# Patient Record
Sex: Male | Born: 1955 | Race: White | Hispanic: No | Marital: Single | Smoking: Former smoker
Health system: Southern US, Community
[De-identification: ages and names within clinical notes are randomized; demographics above are authoritative.]

## PROBLEM LIST (undated history)

## (undated) ENCOUNTER — Ambulatory Visit: Admission: EM | Payer: Medicaid Other | Source: Home / Self Care

## (undated) ENCOUNTER — Emergency Department (HOSPITAL_COMMUNITY): Payer: Medicaid Other

## (undated) DIAGNOSIS — Z8679 Personal history of other diseases of the circulatory system: Secondary | ICD-10-CM

## (undated) DIAGNOSIS — J449 Chronic obstructive pulmonary disease, unspecified: Secondary | ICD-10-CM

## (undated) DIAGNOSIS — G8929 Other chronic pain: Secondary | ICD-10-CM

## (undated) DIAGNOSIS — I739 Peripheral vascular disease, unspecified: Secondary | ICD-10-CM

## (undated) DIAGNOSIS — J329 Chronic sinusitis, unspecified: Secondary | ICD-10-CM

## (undated) DIAGNOSIS — G43909 Migraine, unspecified, not intractable, without status migrainosus: Secondary | ICD-10-CM

## (undated) DIAGNOSIS — I10 Essential (primary) hypertension: Secondary | ICD-10-CM

## (undated) DIAGNOSIS — K219 Gastro-esophageal reflux disease without esophagitis: Secondary | ICD-10-CM

## (undated) DIAGNOSIS — M25569 Pain in unspecified knee: Secondary | ICD-10-CM

## (undated) DIAGNOSIS — K759 Inflammatory liver disease, unspecified: Secondary | ICD-10-CM

## (undated) DIAGNOSIS — K859 Acute pancreatitis without necrosis or infection, unspecified: Secondary | ICD-10-CM

## (undated) DIAGNOSIS — B029 Zoster without complications: Secondary | ICD-10-CM

## (undated) DIAGNOSIS — M25579 Pain in unspecified ankle and joints of unspecified foot: Secondary | ICD-10-CM

## (undated) DIAGNOSIS — R569 Unspecified convulsions: Secondary | ICD-10-CM

## (undated) DIAGNOSIS — E162 Hypoglycemia, unspecified: Secondary | ICD-10-CM

## (undated) DIAGNOSIS — E785 Hyperlipidemia, unspecified: Secondary | ICD-10-CM

## (undated) HISTORY — DX: Migraine, unspecified, not intractable, without status migrainosus: G43.909

## (undated) HISTORY — DX: Zoster without complications: B02.9

## (undated) HISTORY — PX: KNEE CARTILAGE SURGERY: SHX688

## (undated) HISTORY — DX: Hypoglycemia, unspecified: E16.2

## (undated) HISTORY — PX: TOOTH EXTRACTION: SUR596

## (undated) HISTORY — DX: Essential (primary) hypertension: I10

## (undated) HISTORY — DX: Hyperlipidemia, unspecified: E78.5

---
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
## 1997-09-15 ENCOUNTER — Inpatient Hospital Stay (HOSPITAL_COMMUNITY): Admission: EM | Admit: 1997-09-15 | Discharge: 1997-09-16 | Payer: Self-pay | Admitting: *Deleted

## 2000-09-14 ENCOUNTER — Emergency Department (HOSPITAL_COMMUNITY): Admission: EM | Admit: 2000-09-14 | Discharge: 2000-09-14 | Payer: Self-pay | Admitting: Emergency Medicine

## 2003-01-18 ENCOUNTER — Ambulatory Visit (HOSPITAL_COMMUNITY): Admission: RE | Admit: 2003-01-18 | Discharge: 2003-01-18 | Payer: Self-pay | Admitting: Pulmonary Disease

## 2003-02-07 ENCOUNTER — Ambulatory Visit (HOSPITAL_COMMUNITY): Admission: RE | Admit: 2003-02-07 | Discharge: 2003-02-07 | Payer: Self-pay | Admitting: Pulmonary Disease

## 2007-06-28 ENCOUNTER — Emergency Department (HOSPITAL_COMMUNITY): Admission: EM | Admit: 2007-06-28 | Discharge: 2007-06-28 | Payer: Self-pay | Admitting: Emergency Medicine

## 2007-08-13 ENCOUNTER — Ambulatory Visit (HOSPITAL_COMMUNITY): Admission: RE | Admit: 2007-08-13 | Discharge: 2007-08-13 | Payer: Self-pay | Admitting: Pulmonary Disease

## 2009-10-06 ENCOUNTER — Emergency Department (HOSPITAL_COMMUNITY): Admission: EM | Admit: 2009-10-06 | Discharge: 2009-10-06 | Payer: Self-pay | Admitting: Emergency Medicine

## 2010-01-22 ENCOUNTER — Ambulatory Visit (HOSPITAL_COMMUNITY): Admission: RE | Admit: 2010-01-22 | Discharge: 2010-01-22 | Payer: Self-pay | Admitting: Pulmonary Disease

## 2010-02-11 ENCOUNTER — Ambulatory Visit (HOSPITAL_COMMUNITY): Admission: RE | Admit: 2010-02-11 | Discharge: 2010-02-11 | Payer: Self-pay | Admitting: Pulmonary Disease

## 2010-02-11 ENCOUNTER — Encounter: Payer: Self-pay | Admitting: Orthopedic Surgery

## 2010-02-27 ENCOUNTER — Ambulatory Visit: Payer: Self-pay | Admitting: Orthopedic Surgery

## 2010-02-27 DIAGNOSIS — IMO0002 Reserved for concepts with insufficient information to code with codable children: Secondary | ICD-10-CM

## 2010-02-27 DIAGNOSIS — S82109A Unspecified fracture of upper end of unspecified tibia, initial encounter for closed fracture: Secondary | ICD-10-CM | POA: Insufficient documentation

## 2010-04-30 ENCOUNTER — Ambulatory Visit
Admission: RE | Admit: 2010-04-30 | Discharge: 2010-04-30 | Payer: Self-pay | Source: Home / Self Care | Attending: Orthopedic Surgery | Admitting: Orthopedic Surgery

## 2010-04-30 NOTE — Letter (Signed)
Summary: History form  History form   Imported By: Jacklynn Ganong 02/28/2010 09:04:20  _____________________________________________________________________  External Attachment:    Type:   Image     Comment:   External Document

## 2010-04-30 NOTE — Assessment & Plan Note (Signed)
Summary: left knee pain xr & mri at ap/applied for cone disc/hawkins/bsf   Visit Type:  new patient Referring Briana Farner:  Dr. Kari Baars  CC:  left knee pain.  History of Present Illness: I saw Brady Bell in the office today for an initial visit.  He is a 55 years old man with the complaint of:  severe anterior/ medial knee pain with swelling and pain when eight bearing after falling down some stairs.   Xrays were taken at [facility] on 01-22-10 shows osseous demineralization, minimal drgenerative changes, no acute abnormalities. Mri also done on 02-11-10.  Medications: Topamax, Imitrex 6 mg, Alprazolam.  Allergies (verified): No Known Drug Allergies  Past History:  Family History: Last updated: 02/27/2010 Family History of Diabetes  Past Medical History: Migraines Hypoglycemia  Family History: Family History of Diabetes  Social History: Patient is single.   Review of Systems Constitutional:  Complains of fatigue; denies weight loss, weight gain, fever, and chills. Cardiovascular:  Denies chest pain, palpitations, fainting, and murmurs. Respiratory:  Complains of snoring; denies short of breath, wheezing, couch, tightness, pain on inspiration, and snoring . Gastrointestinal:  Denies heartburn, nausea, vomiting, diarrhea, constipation, and blood in your stools. Genitourinary:  Denies frequency, urgency, difficulty urinating, painful urination, flank pain, and bleeding in urine. Neurologic:  Complains of seizure; denies numbness, tingling, unsteady gait, dizziness, and tremors. Musculoskeletal:  Complains of swelling and muscle pain; denies joint pain, instability, stiffness, redness, and heat. Endocrine:  Denies excessive thirst, exessive urination, and heat or cold intolerance. Psychiatric:  Denies nervousness, depression, anxiety, and hallucinations. Skin:  Denies changes in the skin, poor healing, rash, itching, and redness. HEENT:  Complains of watering; denies  blurred or double vision, eye pain, and redness. Immunology:  Complains of seasonal allergies; denies sinus problems and allergic to bee stings. Hemoatologic:  Denies easy bleeding and brusing.  Physical Exam  Additional Exam:  This is a well-developed well-nourished male in no acute distress with normal grooming and hygiene and no gross deformities  He has normal pulse and perfusion and no peripheral edema in his lower extremities  His lymph nodes are negative with no lymphangitis  Skin intact with no rashes in the LEFT lower extremity  Sensory exam is normal coordination is excellent balance is good.  He is awake alert and oriented x3 his mood and affect are normal  His ambulation is significant for a mild limp favoring the LEFT leg  He does have tenderness over the medial tibia and medial joint line primarily anteriorly but not posteriorly.  There is no joint effusion he is very tender in the suprapatellar pouch.  He has full range of motion good strength in his leg and his knee is stable with a minimal McMurray sign.  His upper extremities are benign with normal inspection, lack of contracture, lack of subluxation atrophy or tremor   Impression & Recommendations:  Problem # 1:  TEAR MEDIAL MENISCUS (ICD-836.0) Assessment New  imaging and reports LEFT knee negative plain films positive MRI with medial meniscal tear and proximal tibial bone contusion  I think this is the source of his pain I am not concerned about the meniscal tear per se.  While this may have been torn at the time of the fall he seems to be having more pain at the proximal tibia.  Recommend hinged knee brace to unload the joint followup 6 weeks assess pain relief at that time.  Mechanical symptoms should be the impetus for surgical treatment of the  meniscal tear not pain  Orders: New Patient Level III (16109)  Problem # 2:  CLOSED FRACTURE OF UPPER END OF TIBIA (ICD-823.00) Assessment: New  Orders: New  Patient Level III (60454)  Patient Instructions: 1)  Please schedule a follow-up appointment in 6 weeks 2)  wear brace for all weight bearing activity    Orders Added: 1)  New Patient Level III [09811]

## 2010-04-30 NOTE — Medication Information (Signed)
Summary: Tax adviser   Imported By: Cammie Sickle 02/27/2010 11:47:37  _____________________________________________________________________  External Attachment:    Type:   Image     Comment:   External Document
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
## 2010-06-16 LAB — POCT I-STAT, CHEM 8
BUN: 7 mg/dL (ref 6–23)
Calcium, Ion: 1.09 mmol/L — ABNORMAL LOW (ref 1.12–1.32)
HCT: 49 % (ref 39.0–52.0)
Hemoglobin: 16.7 g/dL (ref 13.0–17.0)
Sodium: 137 mEq/L (ref 135–145)
TCO2: 18 mmol/L (ref 0–100)

## 2010-06-16 LAB — ETHANOL: Alcohol, Ethyl (B): <5 mg/dL (ref 0–10)

## 2010-06-16 LAB — GLUCOSE, CAPILLARY: Glucose-Capillary: 127 mg/dL — ABNORMAL HIGH (ref 70–99)

## 2010-06-16 LAB — CARBAMAZEPINE LEVEL, TOTAL: Carbamazepine Lvl: <2 ug/mL — ABNORMAL LOW (ref 4.0–12.0)

## 2010-12-23 LAB — DIFFERENTIAL
Basophils Absolute: 0
Basophils Relative: 0
Lymphocytes Relative: 16
Monocytes Relative: 7
Neutro Abs: 5.5
Neutrophils Relative %: 76

## 2010-12-23 LAB — CBC
MCHC: 35.4
RBC: 4.29

## 2010-12-23 LAB — BASIC METABOLIC PANEL
CO2: 22
Calcium: 9.1
Creatinine, Ser: 1.02
GFR calc Af Amer: >60
GFR calc non Af Amer: >60

## 2011-04-29 ENCOUNTER — Emergency Department (HOSPITAL_COMMUNITY)
Admission: EM | Admit: 2011-04-29 | Discharge: 2011-04-30 | Disposition: A | Discharge status: Home / Self Care | Payer: Self-pay | Attending: Emergency Medicine | Admitting: Emergency Medicine

## 2011-04-29 ENCOUNTER — Encounter (HOSPITAL_COMMUNITY): Payer: Self-pay | Admitting: *Deleted

## 2011-04-29 DIAGNOSIS — S93409A Sprain of unspecified ligament of unspecified ankle, initial encounter: Secondary | ICD-10-CM | POA: Insufficient documentation

## 2011-04-29 DIAGNOSIS — S93401A Sprain of unspecified ligament of right ankle, initial encounter: Secondary | ICD-10-CM

## 2011-04-29 DIAGNOSIS — G43909 Migraine, unspecified, not intractable, without status migrainosus: Secondary | ICD-10-CM | POA: Insufficient documentation

## 2011-04-29 DIAGNOSIS — Y92009 Unspecified place in unspecified non-institutional (private) residence as the place of occurrence of the external cause: Secondary | ICD-10-CM | POA: Insufficient documentation

## 2011-04-29 DIAGNOSIS — F17200 Nicotine dependence, unspecified, uncomplicated: Secondary | ICD-10-CM | POA: Insufficient documentation

## 2011-04-29 DIAGNOSIS — W010XXA Fall on same level from slipping, tripping and stumbling without subsequent striking against object, initial encounter: Secondary | ICD-10-CM | POA: Insufficient documentation

## 2011-04-29 NOTE — ED Notes (Signed)
Pt fell in the yard and twisted his R ankle and fell on top of his R ankle; Was able to get up and ambulate back indoors; swelling  And pain increased pt called EMS for transport to ED

## 2011-04-30 ENCOUNTER — Emergency Department (HOSPITAL_COMMUNITY): Payer: Self-pay

## 2011-04-30 ENCOUNTER — Encounter (HOSPITAL_COMMUNITY): Payer: Self-pay | Admitting: Emergency Medicine

## 2011-04-30 MED ORDER — IBUPROFEN 400 MG PO TABS
400.0000 mg | ORAL_TABLET | Freq: Once | ORAL | Status: AC
  Administered 2011-04-30: 400 mg via ORAL
  Filled 2011-04-30: qty 1

## 2011-04-30 MED ORDER — HYDROCODONE-ACETAMINOPHEN 5-325 MG PO TABS
1.0000 | ORAL_TABLET | Freq: Once | ORAL | Status: AC
  Administered 2011-04-30: 1 via ORAL
  Filled 2011-04-30: qty 1

## 2011-04-30 MED ORDER — NAPROXEN 250 MG PO TABS: 250.0000 mg | ORAL_TABLET | Freq: Two times a day (BID) | ORAL | Status: DC

## 2011-04-30 MED ORDER — HYDROCODONE-ACETAMINOPHEN 5-325 MG PO TABS: ORAL_TABLET | ORAL | Status: AC

## 2011-04-30 NOTE — ED Notes (Signed)
Crunches and air splint education provided questions answered

## 2011-04-30 NOTE — ED Provider Notes (Signed)
History     CSN: [id number]  Arrival date & time 04/29/11  2347   Chief Complaint  Patient presents with  . Fall  . Ankle Pain    Right   HPI Pt was seen at 0025.  Per pt, c/o gradual onset and persistence of right ankle "pain" that began last evening PTA.  Pt states he was in his yard, slipped/fell and "twisted" his right ankle.  Has been ambulatory since falling.  Denies prodromal symptoms before fall, no CP/SOB, no neck or back pain, no head injury/LOC, no abd pain, no tingling/numbness in extremities, no focal motor weakness.    Ortho:  Dr. Romeo Apple PMD:  Dr. Juanetta Gosling Past Medical History  Diagnosis Date  . Migraines   . Hypoglycemia     History reviewed. No pertinent past surgical history.  Family History  Problem Relation Age of Onset  . Diabetes      History  Substance Use Topics  . Smoking status: Current Everyday Smoker    Types: Cigarettes  . Smokeless tobacco: Not on file  . Alcohol Use: Yes    Review of Systems ROS: Statement: All systems negative except as marked or noted in the HPI; Constitutional: Negative for fever and chills. ; ; Eyes: Negative for eye pain, redness and discharge. ; ; ENMT: Negative for ear pain, hoarseness, nasal congestion, sinus pressure and sore throat. ; ; Cardiovascular: Negative for chest pain, palpitations, diaphoresis, dyspnea and peripheral edema. ; ; Respiratory: Negative for cough, wheezing and stridor. ; ; Gastrointestinal: Negative for nausea, vomiting, diarrhea, abdominal pain, blood in stool, hematemesis, jaundice and rectal bleeding. . ; ; Genitourinary: Negative for dysuria, flank pain and hematuria. ; ; Musculoskeletal: Negative for back pain and neck pain. +swelling and pain right ankle.; Skin: Negative for pruritus, rash, abrasions, blisters, bruising and skin lesion.; ; Neuro: Negative for headache, lightheadedness and neck stiffness. Negative for weakness, altered level of consciousness , altered mental status, extremity  weakness, paresthesias, involuntary movement, seizure and syncope.     Allergies  Review of patient's allergies indicates no known allergies.  Home Medications   Current Outpatient Rx  Name Route Sig Dispense Refill  . XANAX PO Oral Take by mouth.    . SUMATRIPTAN SUCCINATE 6 MG/0.5ML Bennett SOLN Subcutaneous Inject 6 mg into the skin every 2 (two) hours as needed. F    . HYDROCODONE-ACETAMINOPHEN 5-325 MG PO TABS  1 or 2 tabs PO q4-6 hours prn pain 20 tablet 0  . NAPROXEN 250 MG PO TABS Oral Take 1 tablet (250 mg total) by mouth 2 (two) times daily with a meal. 14 tablet 0    BP 128/89  Pulse 85  Temp(Src) 98 F (36.7 C) (Oral)  Resp 18  Ht 5\' 10"  (1.778 m)  Wt 140 lb (63.504 kg)  BMI 20.09 kg/m2  SpO2 94%  Physical Exam 0030: Physical examination:  Nursing notes reviewed; Vital signs and O2 SAT reviewed;  Constitutional: Well developed, Well nourished, Well hydrated, In no acute distress; Head:  Normocephalic, atraumatic; Eyes: EOMI, PERRL, No scleral icterus; ENMT: Mouth and pharynx normal, Mucous membranes moist; Neck: Supple, Full range of motion, No lymphadenopathy; Cardiovascular: Regular rate and rhythm, No murmur, rub, or gallop; Respiratory: Breath sounds clear & equal bilaterally, No rales, rhonchi, wheezes, or rub, Normal respiratory effort/excursion; Chest: Nontender, Movement normal; Abdomen: Soft, Nontender, Nondistended, Normal bowel sounds; Extremities: Pulses normal, +tender to palp right lateral maleolar area w/localized edema, NMS intact right foot, strong pedal pp, LE muscle  compartments soft.  No right proximal fibular head tenderness, no knee tenderness, no right foot tenderness.  No open wounds, no ecchymosis, no deformity. +plantarflexion of right foot w/calf squeeze.  No palpable gap right Achilles's tendon.  Decreased ROM F/E d/t pain.,No calf edema or asymmetry.; Neuro: AA&Ox3, Major CN grossly intact. No facial droop, speech clear. No gross focal motor or sensory  deficits in extremities.; Skin: Color normal, Warm, Dry, no rash.    ED Course  Procedures   MDM  MDM Reviewed: nursing note, vitals and previous chart Interpretation: x-ray   Dg Ankle Complete Right 04/30/2011  *RADIOLOGY REPORT*  Clinical Data: Right ankle pain after twisting injury  RIGHT ANKLE - COMPLETE 3+ VIEW  Comparison: None.  Findings: Mild soft tissue swelling about the right ankle.  No evidence of acute fracture or subluxation.  No focal bone lesion or bone destruction.  Bone cortex and trabecular architecture appear intact.  Ankle mortis and talar dome appear intact.  No abnormal periosteal reaction.  No radiopaque foreign bodies in the soft tissues.  IMPRESSION: Soft tissue swelling.  No acute fractures demonstrated.  Original Report Authenticated By: Marlon Pel, M.D.     12:46 AM:   Dx testing d/w pt.  Questions answered.  Verb understanding, agreeable to d/c home with outpt f/u.       Rifky Lapre Allison Quarry, DO 05/02/11 [phone]

## 2011-06-03 ENCOUNTER — Emergency Department (HOSPITAL_COMMUNITY): Payer: Self-pay

## 2011-06-03 ENCOUNTER — Emergency Department (HOSPITAL_COMMUNITY)
Admission: EM | Admit: 2011-06-03 | Discharge: 2011-06-03 | Disposition: A | Discharge status: Home / Self Care | Payer: Self-pay | Attending: Emergency Medicine | Admitting: Emergency Medicine

## 2011-06-03 ENCOUNTER — Encounter (HOSPITAL_COMMUNITY): Payer: Self-pay | Admitting: *Deleted

## 2011-06-03 DIAGNOSIS — E1169 Type 2 diabetes mellitus with other specified complication: Secondary | ICD-10-CM | POA: Insufficient documentation

## 2011-06-03 DIAGNOSIS — M47812 Spondylosis without myelopathy or radiculopathy, cervical region: Secondary | ICD-10-CM | POA: Insufficient documentation

## 2011-06-03 DIAGNOSIS — E162 Hypoglycemia, unspecified: Secondary | ICD-10-CM

## 2011-06-03 DIAGNOSIS — S0990XA Unspecified injury of head, initial encounter: Secondary | ICD-10-CM | POA: Insufficient documentation

## 2011-06-03 DIAGNOSIS — R569 Unspecified convulsions: Secondary | ICD-10-CM | POA: Insufficient documentation

## 2011-06-03 DIAGNOSIS — S0003XA Contusion of scalp, initial encounter: Secondary | ICD-10-CM | POA: Insufficient documentation

## 2011-06-03 DIAGNOSIS — F17200 Nicotine dependence, unspecified, uncomplicated: Secondary | ICD-10-CM | POA: Insufficient documentation

## 2011-06-03 DIAGNOSIS — G43909 Migraine, unspecified, not intractable, without status migrainosus: Secondary | ICD-10-CM | POA: Insufficient documentation

## 2011-06-03 DIAGNOSIS — X58XXXA Exposure to other specified factors, initial encounter: Secondary | ICD-10-CM | POA: Insufficient documentation

## 2011-06-03 DIAGNOSIS — S0120XA Unspecified open wound of nose, initial encounter: Secondary | ICD-10-CM | POA: Insufficient documentation

## 2011-06-03 DIAGNOSIS — R55 Syncope and collapse: Secondary | ICD-10-CM | POA: Insufficient documentation

## 2011-06-03 HISTORY — DX: Unspecified convulsions: R56.9

## 2011-06-03 LAB — GLUCOSE, CAPILLARY: Glucose-Capillary: 138 mg/dL — ABNORMAL HIGH (ref 70–99)

## 2011-06-03 MED ORDER — HYDROCODONE-ACETAMINOPHEN 5-325 MG PO TABS
1.0000 | ORAL_TABLET | Freq: Once | ORAL | Status: AC
  Administered 2011-06-03: 1 via ORAL
  Filled 2011-06-03: qty 1

## 2011-06-03 NOTE — ED Provider Notes (Signed)
History   This chart was scribed for [employer]. Colon Branch, MD by Clarita Crane. The patient was seen in room APA02/APA02. Patient's care was started at 1215.    CSN: [id number]  Arrival date & time 06/03/11  1215   First MD Initiated Contact with Patient 06/03/11 1321      Chief Complaint  Patient presents with  . Seizures    (Consider location/radiation/quality/duration/timing/severity/associated sxs/prior treatment) HPI Brady Bell is a 56 y.o. male who presents to the Emergency Department to be evaluated following witnessed seizure which occurred while visiting with his mother in the hospital this afternoon. Patient sustained head injury with seizure and small laceration to bridge of nose. Also presents with bruising and swelling to left periorbital region. Denies HA, fever, incontinence, fever. Reports he has been experiencing increased stress recently as a result of a family member being hospitalized. Patient states he has experienced seizures previously but infrequently. Patient with h/o migraines, hypoglycemia and seizures.   Past Medical History  Diagnosis Date  . Migraines   . Hypoglycemia   . Seizures     History reviewed. No pertinent past surgical history.  Family History  Problem Relation Age of Onset  . Diabetes      History  Substance Use Topics  . Smoking status: Current Everyday Smoker    Types: Cigarettes  . Smokeless tobacco: Not on file  . Alcohol Use: Yes      Review of Systems 10 Systems reviewed and are negative for acute change except as noted in the HPI.  Allergies  Review of patient's allergies indicates no known allergies.  Home Medications   Current Outpatient Rx  Name Route Sig Dispense Refill  . ALPRAZOLAM 2 MG PO TABS Oral Take 2 mg by mouth daily.    Marland Kitchen NAPROXEN 500 MG PO TABS Oral Take 500 mg by mouth 3 (three) times daily.    . SUMATRIPTAN SUCCINATE 6 MG/0.5ML [place] SOLN Subcutaneous Inject 6 mg into the skin every 2 (two) hours as  needed. Migraines      BP 131/84  Pulse 93  Temp(Src) 97.9 F (36.6 C) (Oral)  Resp 18  Ht 5\' 10"  (1.778 m)  Wt 140 lb (63.504 kg)  BMI 20.09 kg/m2  SpO2 97%  Physical Exam  Nursing note and vitals reviewed. Constitutional: He is oriented to person, place, and time. He appears well-developed and well-nourished. No distress.  HENT:  Head: Normocephalic and atraumatic.  Mouth/Throat: Oropharynx is clear and moist.       TMs normal bilaterally. Bruising noted across forehead. Left periorbital edema. Small laceration to bridge of nose. No deformity noted to nose. No oral lesions.   Eyes: EOM are normal. Pupils are equal, round, and reactive to light.  Neck: Neck supple. No tracheal deviation present.  Cardiovascular: Normal rate and regular rhythm.  Exam reveals no gallop and no friction rub.   No murmur heard. Pulmonary/Chest: Effort normal. No respiratory distress. He has no wheezes. He has no rales.       Crackles at bilateral bases.   Abdominal: Soft. He exhibits no distension.  Musculoskeletal: Normal range of motion. He exhibits no edema.       Brace noted to right lower leg.   Neurological: He is alert and oriented to person, place, and time. No sensory deficit.  Skin: Skin is warm and dry.  Psychiatric: He has a normal mood and affect. His behavior is normal.    ED Course  Procedures (including critical care time)  DIAGNOSTIC STUDIES: Oxygen Saturation is 97% on room air, normal by my interpretation.    COORDINATION OF CARE:  1:56PM- Patient informed of current plan for treatment and evaluation and agrees with plan at this time.  Ct Head Wo Contrast  06/03/2011  *RADIOLOGY REPORT*  Clinical Data:  Seizure.  Trauma to head.  Laceration to bridge of the nose.  Hypoglycemic.  CT HEAD WITHOUT CONTRAST CT MAXILLOFACIAL WITHOUT CONTRAST CT CERVICAL SPINE WITHOUT CONTRAST  Technique:  Multidetector CT imaging of the head, cervical spine, and maxillofacial structures were  performed using the standard protocol without intravenous contrast. Multiplanar CT image reconstructions of the cervical spine and maxillofacial structures were also generated.  Comparison:  CT head without contrast 10/06/2009 at [facility].  CT cervical spine without contrast at [facility] 06/28/2007.  CT HEAD  Findings: No acute cortical infarct, hemorrhage, or mass lesion is present.  Mild atrophy is similar to the prior exam.  The ventricles are of normal size.  No significant extra-axial fluid collection is present.  Left supraorbital soft tissue swelling is present.  There is no underlying fracture.  The globes and orbits are intact. Circumferential mucosal thickening is present in the left maxillary sinus.  A remote orbital blowout fractures present on the right. There is mild circumferential mucosal thickening in the left frontal sinus.  The mastoid air cells are clear.  IMPRESSION:  1.  No acute intracranial abnormality or significant interval change. 2.  Stable mild age advanced atrophy. 3.  Left supraorbital scalp hematoma without underlying fracture. 4.  Anterior left sinus disease.  CT MAXILLOFACIAL  Findings:  A left supraorbital scalp hematoma is present.  There is no underlying fracture.  Asymmetric left frontal and to a greater extent left maxillary sinus disease is present.  The patient is status post bilateral turbinectomy with some residual portion of the right inferior turbinate remaining.  The nasal septum is eroded or resected anteriorly.  There is no osseous obstruction of the ostiomeatal complex.  There is soft tissue filling the residual ostiomeatal complex on the left.  Chronic left maxillary sinus disease present.  There is a tiny fluid level in the right maxillary sinus.  Asymmetric mucosal disease is present in the left frontal sinus and scattered throughout the ethmoid air cells.  A remote right orbital blowout fracture is noted.  The globes and orbits are intact.   The mastoid air cells are clear.  IMPRESSION:  1.  Left supraorbital scalp hematoma without underlying fracture. 2.  Chronic left maxillary sinus disease. 3.  Nasal surgery as described. 4.  Left frontal sinus disease.  CT CERVICAL SPINE  Findings:   The cervical spine is imaged from skull base through T2- 3.  Slight degenerative anterolisthesis is present at C3-4.  AP alignment is otherwise anatomic. No acute fracture or traumatic subluxation is evident.  Uncovertebral spurring with right greater than left foraminal stenosis at C5-6 and left greater than right foraminal narrowing at C6-7 is not significantly changed from the prior exam.  Asymmetric right-sided uncovertebral disease at C3 is similar to the prior study.  No acute fracture or traumatic subluxation is evident.  Emphysematous changes at the lung apices bilaterally are similar to the prior study.  The soft tissues of the neck are unremarkable.  IMPRESSION:  1.  No acute fracture or traumatic subluxation. 2.  Stable spondylosis. 3.  Severe emphysematous changes are similar to the prior exam.  Original Report Authenticated By: Jamesetta Orleans. MATTERN, M.D.  Ct Cervical Spine Wo Contrast  06/03/2011  *RADIOLOGY REPORT*  Clinical Data:  Seizure.  Trauma to head.  Laceration to bridge of the nose.  Hypoglycemic.  CT HEAD WITHOUT CONTRAST CT MAXILLOFACIAL WITHOUT CONTRAST CT CERVICAL SPINE WITHOUT CONTRAST  Technique:  Multidetector CT imaging of the head, cervical spine, and maxillofacial structures were performed using the standard protocol without intravenous contrast. Multiplanar CT image reconstructions of the cervical spine and maxillofacial structures were also generated.  Comparison:  CT head without contrast 10/06/2009 at [facility].  CT cervical spine without contrast at [facility] 06/28/2007.  CT HEAD  Findings: No acute cortical infarct, hemorrhage, or mass lesion is present.  Mild atrophy is similar to the prior exam.  The  ventricles are of normal size.  No significant extra-axial fluid collection is present.  Left supraorbital soft tissue swelling is present.  There is no underlying fracture.  The globes and orbits are intact. Circumferential mucosal thickening is present in the left maxillary sinus.  A remote orbital blowout fractures present on the right. There is mild circumferential mucosal thickening in the left frontal sinus.  The mastoid air cells are clear.  IMPRESSION:  1.  No acute intracranial abnormality or significant interval change. 2.  Stable mild age advanced atrophy. 3.  Left supraorbital scalp hematoma without underlying fracture. 4.  Anterior left sinus disease.  CT MAXILLOFACIAL  Findings:  A left supraorbital scalp hematoma is present.  There is no underlying fracture.  Asymmetric left frontal and to a greater extent left maxillary sinus disease is present.  The patient is status post bilateral turbinectomy with some residual portion of the right inferior turbinate remaining.  The nasal septum is eroded or resected anteriorly.  There is no osseous obstruction of the ostiomeatal complex.  There is soft tissue filling the residual ostiomeatal complex on the left.  Chronic left maxillary sinus disease present.  There is a tiny fluid level in the right maxillary sinus.  Asymmetric mucosal disease is present in the left frontal sinus and scattered throughout the ethmoid air cells.  A remote right orbital blowout fracture is noted.  The globes and orbits are intact.  The mastoid air cells are clear.  IMPRESSION:  1.  Left supraorbital scalp hematoma without underlying fracture. 2.  Chronic left maxillary sinus disease. 3.  Nasal surgery as described. 4.  Left frontal sinus disease.  CT CERVICAL SPINE  Findings:   The cervical spine is imaged from skull base through T2- 3.  Slight degenerative anterolisthesis is present at C3-4.  AP alignment is otherwise anatomic. No acute fracture or traumatic subluxation is evident.   Uncovertebral spurring with right greater than left foraminal stenosis at C5-6 and left greater than right foraminal narrowing at C6-7 is not significantly changed from the prior exam.  Asymmetric right-sided uncovertebral disease at C3 is similar to the prior study.  No acute fracture or traumatic subluxation is evident.  Emphysematous changes at the lung apices bilaterally are similar to the prior study.  The soft tissues of the neck are unremarkable.  IMPRESSION:  1.  No acute fracture or traumatic subluxation. 2.  Stable spondylosis. 3.  Severe emphysematous changes are similar to the prior exam.  Original Report Authenticated By: Jamesetta Orleans. MATTERN, M.D.   Ct Maxillofacial Wo Cm  06/03/2011  *RADIOLOGY REPORT*  Clinical Data:  Seizure.  Trauma to head.  Laceration to bridge of the nose.  Hypoglycemic.  CT HEAD WITHOUT CONTRAST CT MAXILLOFACIAL WITHOUT CONTRAST  CT CERVICAL SPINE WITHOUT CONTRAST  Technique:  Multidetector CT imaging of the head, cervical spine, and maxillofacial structures were performed using the standard protocol without intravenous contrast. Multiplanar CT image reconstructions of the cervical spine and maxillofacial structures were also generated.  Comparison:  CT head without contrast 10/06/2009 at [facility].  CT cervical spine without contrast at [facility] 06/28/2007.  CT HEAD  Findings: No acute cortical infarct, hemorrhage, or mass lesion is present.  Mild atrophy is similar to the prior exam.  The ventricles are of normal size.  No significant extra-axial fluid collection is present.  Left supraorbital soft tissue swelling is present.  There is no underlying fracture.  The globes and orbits are intact. Circumferential mucosal thickening is present in the left maxillary sinus.  A remote orbital blowout fractures present on the right. There is mild circumferential mucosal thickening in the left frontal sinus.  The mastoid air cells are clear.  IMPRESSION:  1.   No acute intracranial abnormality or significant interval change. 2.  Stable mild age advanced atrophy. 3.  Left supraorbital scalp hematoma without underlying fracture. 4.  Anterior left sinus disease.  CT MAXILLOFACIAL  Findings:  A left supraorbital scalp hematoma is present.  There is no underlying fracture.  Asymmetric left frontal and to a greater extent left maxillary sinus disease is present.  The patient is status post bilateral turbinectomy with some residual portion of the right inferior turbinate remaining.  The nasal septum is eroded or resected anteriorly.  There is no osseous obstruction of the ostiomeatal complex.  There is soft tissue filling the residual ostiomeatal complex on the left.  Chronic left maxillary sinus disease present.  There is a tiny fluid level in the right maxillary sinus.  Asymmetric mucosal disease is present in the left frontal sinus and scattered throughout the ethmoid air cells.  A remote right orbital blowout fracture is noted.  The globes and orbits are intact.  The mastoid air cells are clear.  IMPRESSION:  1.  Left supraorbital scalp hematoma without underlying fracture. 2.  Chronic left maxillary sinus disease. 3.  Nasal surgery as described. 4.  Left frontal sinus disease.  CT CERVICAL SPINE  Findings:   The cervical spine is imaged from skull base through T2- 3.  Slight degenerative anterolisthesis is present at C3-4.  AP alignment is otherwise anatomic. No acute fracture or traumatic subluxation is evident.  Uncovertebral spurring with right greater than left foraminal stenosis at C5-6 and left greater than right foraminal narrowing at C6-7 is not significantly changed from the prior exam.  Asymmetric right-sided uncovertebral disease at C3 is similar to the prior study.  No acute fracture or traumatic subluxation is evident.  Emphysematous changes at the lung apices bilaterally are similar to the prior study.  The soft tissues of the neck are unremarkable.   IMPRESSION:  1.  No acute fracture or traumatic subluxation. 2.  Stable spondylosis. 3.  Severe emphysematous changes are similar to the prior exam.  Original Report Authenticated By: Jamesetta Orleans. MATTERN, M.D.   Results for orders placed during the hospital encounter of 06/03/11  GLUCOSE, CAPILLARY      Component Value Range   Glucose-Capillary 138 (*) 70 - 99 (mg/dL)      MDM  Diabetic patient who has had syncope with seizure like activity when hypoglycemic who had the same while in his mother's hospital room today. Given a meal to eat. CTs negative for an acute process Pt stable  in ED with no significant deterioration in condition.The patient appears reasonably screened and/or stabilized for discharge and I doubt any other medical condition or other [facility] requiring further screening, evaluation, or treatment in the ED at this time prior to discharge.  I personally performed the services described in this documentation, which was scribed in my presence. The recorded information has been reviewed and considered.   MDM Reviewed: nursing note and vitals Interpretation: labs  CT scans reviewed        Nicoletta Dress. Colon Branch, MD 06/03/11 [id number]

## 2011-06-03 NOTE — Discharge Instructions (Signed)
The CTs of your head, neck and face do NOT show any broken bones. Be sure to eat regularly.

## 2011-06-03 NOTE — ED Notes (Signed)
Pt was visitor in pt room, and says he had a  Seizure,Injury to nose, bit his lip contusions.

## 2011-09-04 ENCOUNTER — Inpatient Hospital Stay (HOSPITAL_COMMUNITY)
Admission: EM | Admit: 2011-09-04 | Discharge: 2011-09-11 | DRG: 253 | Disposition: A | Discharge status: Home Health | Payer: Medicaid Other | Attending: Vascular Surgery | Admitting: Vascular Surgery

## 2011-09-04 ENCOUNTER — Emergency Department (HOSPITAL_COMMUNITY): Payer: Medicaid Other

## 2011-09-04 ENCOUNTER — Encounter (HOSPITAL_COMMUNITY): Payer: Self-pay

## 2011-09-04 DIAGNOSIS — I7090 Unspecified atherosclerosis: Secondary | ICD-10-CM

## 2011-09-04 DIAGNOSIS — M79609 Pain in unspecified limb: Secondary | ICD-10-CM

## 2011-09-04 DIAGNOSIS — I7092 Chronic total occlusion of artery of the extremities: Secondary | ICD-10-CM | POA: Diagnosis present

## 2011-09-04 DIAGNOSIS — W19XXXA Unspecified fall, initial encounter: Secondary | ICD-10-CM | POA: Diagnosis present

## 2011-09-04 DIAGNOSIS — G8929 Other chronic pain: Secondary | ICD-10-CM | POA: Diagnosis present

## 2011-09-04 DIAGNOSIS — I998 Other disorder of circulatory system: Secondary | ICD-10-CM | POA: Diagnosis present

## 2011-09-04 DIAGNOSIS — F17200 Nicotine dependence, unspecified, uncomplicated: Secondary | ICD-10-CM | POA: Diagnosis present

## 2011-09-04 DIAGNOSIS — Y929 Unspecified place or not applicable: Secondary | ICD-10-CM

## 2011-09-04 DIAGNOSIS — IMO0002 Reserved for concepts with insufficient information to code with codable children: Secondary | ICD-10-CM | POA: Diagnosis present

## 2011-09-04 DIAGNOSIS — S82109A Unspecified fracture of upper end of unspecified tibia, initial encounter for closed fracture: Secondary | ICD-10-CM | POA: Diagnosis present

## 2011-09-04 DIAGNOSIS — I749 Embolism and thrombosis of unspecified artery: Secondary | ICD-10-CM | POA: Diagnosis present

## 2011-09-04 DIAGNOSIS — I70209 Unspecified atherosclerosis of native arteries of extremities, unspecified extremity: Principal | ICD-10-CM | POA: Diagnosis present

## 2011-09-04 DIAGNOSIS — M79605 Pain in left leg: Secondary | ICD-10-CM

## 2011-09-04 HISTORY — DX: Pain in unspecified knee: M25.569

## 2011-09-04 HISTORY — DX: Pain in unspecified ankle and joints of unspecified foot: M25.579

## 2011-09-04 HISTORY — DX: Other chronic pain: G89.29

## 2011-09-04 LAB — COMPREHENSIVE METABOLIC PANEL
Albumin: 3.2 g/dL — ABNORMAL LOW (ref 3.5–5.2)
Alkaline Phosphatase: 108 U/L (ref 39–117)
BUN: 12 mg/dL (ref 6–23)
Calcium: 9.1 mg/dL (ref 8.4–10.5)
Creatinine, Ser: 0.95 mg/dL (ref 0.50–1.35)
GFR calc Af Amer: >90 mL/min (ref >90)
Glucose, Bld: 147 mg/dL — ABNORMAL HIGH (ref 70–99)
Potassium: 3.9 mEq/L (ref 3.5–5.1)
Total Protein: 7.2 g/dL (ref 6.0–8.3)

## 2011-09-04 LAB — CBC
HCT: 51.7 % (ref 39.0–52.0)
Hemoglobin: 17.2 g/dL — ABNORMAL HIGH (ref 13.0–17.0)
MCH: 35 pg — ABNORMAL HIGH (ref 26.0–34.0)
MCHC: 33.3 g/dL (ref 30.0–36.0)
RDW: 17.1 % — ABNORMAL HIGH (ref 11.5–15.5)

## 2011-09-04 LAB — POCT I-STAT, CHEM 8
Chloride: 101 mEq/L (ref 96–112)
HCT: 52 % (ref 39.0–52.0)
Hemoglobin: 17.7 g/dL — ABNORMAL HIGH (ref 13.0–17.0)
Potassium: 2.7 mEq/L — CL (ref 3.5–5.1)
Sodium: 141 mEq/L (ref 135–145)

## 2011-09-04 LAB — PROTIME-INR
INR: 0.94 (ref 0.00–1.49)
Prothrombin Time: 12.8 seconds (ref 11.6–15.2)

## 2011-09-04 MED ORDER — ONDANSETRON HCL 4 MG/2ML IJ SOLN: 4.0000 mg | Freq: Four times a day (QID) | INTRAMUSCULAR | Status: DC | PRN

## 2011-09-04 MED ORDER — POTASSIUM CHLORIDE CRYS ER 20 MEQ PO TBCR
20.0000 meq | EXTENDED_RELEASE_TABLET | Freq: Once | ORAL | Status: AC
  Administered 2011-09-04: 40 meq via ORAL
  Filled 2011-09-04: qty 2

## 2011-09-04 MED ORDER — HEPARIN (PORCINE) IN NACL 100-0.45 UNIT/ML-% IJ SOLN
850.0000 [IU]/h | INTRAMUSCULAR | Status: DC
  Administered 2011-09-04: 650 [IU]/h via INTRAVENOUS
  Filled 2011-09-04: qty 250

## 2011-09-04 MED ORDER — POTASSIUM CHLORIDE 20 MEQ/15ML (10%) PO LIQD: 40.0000 meq | Freq: Once | ORAL | Status: DC

## 2011-09-04 MED ORDER — ABCIXIMAB 2 MG/ML IV SOLN
INTRAVENOUS | Status: AC
  Filled 2011-09-04: qty 5

## 2011-09-04 MED ORDER — SUMATRIPTAN SUCCINATE 6 MG/0.5ML ~~LOC~~ SOLN
6.0000 mg | SUBCUTANEOUS | Status: DC | PRN
  Filled 2011-09-04: qty 0.5

## 2011-09-04 MED ORDER — TOPIRAMATE 100 MG PO TABS
200.0000 mg | ORAL_TABLET | Freq: Every day | ORAL | Status: DC
  Administered 2011-09-04 – 2011-09-11 (×8): 200 mg via ORAL
  Filled 2011-09-04 (×10): qty 2

## 2011-09-04 MED ORDER — OXYCODONE-ACETAMINOPHEN 5-325 MG PO TABS
1.0000 | ORAL_TABLET | Freq: Four times a day (QID) | ORAL | Status: DC | PRN
  Administered 2011-09-04 – 2011-09-10 (×16): 2 via ORAL
  Filled 2011-09-04 (×19): qty 2

## 2011-09-04 MED ORDER — NICOTINE 21 MG/24HR TD PT24
21.0000 mg | MEDICATED_PATCH | Freq: Every day | TRANSDERMAL | Status: DC
  Administered 2011-09-04 – 2011-09-05 (×2): 21 mg via TRANSDERMAL
  Filled 2011-09-04 (×4): qty 1

## 2011-09-04 MED ORDER — PHENOL 1.4 % MT LIQD
1.0000 | OROMUCOSAL | Status: DC | PRN
  Filled 2011-09-04: qty 177

## 2011-09-04 MED ORDER — HYDRALAZINE HCL 20 MG/ML IJ SOLN
10.0000 mg | INTRAMUSCULAR | Status: DC | PRN
  Filled 2011-09-04: qty 0.5

## 2011-09-04 MED ORDER — POTASSIUM CHLORIDE CRYS ER 20 MEQ PO TBCR
40.0000 meq | EXTENDED_RELEASE_TABLET | Freq: Once | ORAL | Status: AC
  Administered 2011-09-04: 40 meq via ORAL
  Filled 2011-09-04: qty 2

## 2011-09-04 MED ORDER — HEPARIN BOLUS VIA INFUSION
2500.0000 [IU] | Freq: Once | INTRAVENOUS | Status: AC
  Administered 2011-09-04: 2500 [IU] via INTRAVENOUS
  Filled 2011-09-04: qty 2500

## 2011-09-04 MED ORDER — PANTOPRAZOLE SODIUM 40 MG PO TBEC
40.0000 mg | DELAYED_RELEASE_TABLET | Freq: Every day | ORAL | Status: DC
  Administered 2011-09-04 – 2011-09-05 (×2): 40 mg via ORAL
  Filled 2011-09-04 (×2): qty 1

## 2011-09-04 MED ORDER — ALPRAZOLAM 0.5 MG PO TABS
2.0000 mg | ORAL_TABLET | Freq: Every day | ORAL | Status: DC
  Administered 2011-09-04 – 2011-09-10 (×7): 2 mg via ORAL
  Filled 2011-09-04: qty 4
  Filled 2011-09-04: qty 1
  Filled 2011-09-04 (×5): qty 4
  Filled 2011-09-04: qty 3

## 2011-09-04 MED ORDER — LABETALOL HCL 5 MG/ML IV SOLN
10.0000 mg | INTRAVENOUS | Status: DC | PRN
  Filled 2011-09-04: qty 4

## 2011-09-04 MED ORDER — MORPHINE SULFATE 2 MG/ML IJ SOLN
2.0000 mg | INTRAMUSCULAR | Status: DC | PRN
  Administered 2011-09-04 – 2011-09-05 (×7): 4 mg via INTRAVENOUS
  Filled 2011-09-04 (×7): qty 2

## 2011-09-04 MED ORDER — ACETAMINOPHEN 325 MG PO TABS: 325.0000 mg | ORAL_TABLET | ORAL | Status: DC | PRN

## 2011-09-04 MED ORDER — ALUM & MAG HYDROXIDE-SIMETH 200-200-20 MG/5ML PO SUSP: 15.0000 mL | ORAL | Status: DC | PRN

## 2011-09-04 MED ORDER — METOPROLOL TARTRATE 1 MG/ML IV SOLN: 2.0000 mg | INTRAVENOUS | Status: DC | PRN

## 2011-09-04 MED ORDER — GUAIFENESIN-DM 100-10 MG/5ML PO SYRP: 15.0000 mL | ORAL_SOLUTION | ORAL | Status: DC | PRN

## 2011-09-04 MED ORDER — ACETAMINOPHEN 650 MG RE SUPP: 325.0000 mg | RECTAL | Status: DC | PRN

## 2011-09-04 MED ORDER — OXYCODONE-ACETAMINOPHEN 5-325 MG PO TABS
2.0000 | ORAL_TABLET | Freq: Once | ORAL | Status: AC
  Administered 2011-09-04: 2 via ORAL
  Filled 2011-09-04: qty 2

## 2011-09-04 NOTE — Progress Notes (Signed)
ANTICOAGULATION CONSULT NOTE - Initial Consult  Pharmacy Consult for Heparin Indication: Arterial ischemia  No Known Allergies  Patient Measurements: Height: 5\' 10"  (177.8 cm) Weight: 119 lb 11.4 oz (54.3 kg) IBW/kg (Calculated) : 73  Heparin Dosing Weight: 54.3 kg  Vital Signs: Temp: 97.7 F (36.5 C) (06/06 2051) Temp src: Oral (06/06 2051) BP: 143/99 mmHg (06/06 2051) Pulse Rate: 75  (06/06 2051)  Labs:  Basename 09/04/11 1730  HGB 17.7*  HCT 52.0  PLT --  APTT --  LABPROT --  INR --  HEPARINUNFRC --  CREATININE 1.00  CKTOTAL --  CKMB --  TROPONINI --    Estimated Creatinine Clearance: 65.6 ml/min (by C-G formula based on Cr of 1).   Medical History: Past Medical History  Diagnosis Date  . Migraines   . Hypoglycemia   . Seizures   . Chronic knee pain   . Chronic ankle pain     Assessment: 56 y.o. M to start heparin with a bolus (per MD order) for arterial ischemia while awaiting an angiogram tomorrow (6/7). Heparin weight: 54.3 kg, SCr 1, CrCl~60-70 ml/min. Baseline Hgb~17.7. Per discussion with Dr. Myra Gianotti, the drip will be stopped on call to the angiogram procedure tomorrow.   Goal of Therapy:  Heparin level 0.3-0.7 units/ml Monitor platelets by anticoagulation protocol: Yes   Plan:  1. Heparin bolus of 2500 units IV x 1  2. Initiate heparin drip at rate of 650 units/hr (6.5 ml/hr) 3. Will continue to monitor for any signs/symptoms of bleeding and will follow up with heparin level in 6 hours   Georgina Pillion, PharmD, BCPS Clinical Pharmacist Pager: [phone] 09/04/2011 10:20 PM

## 2011-09-04 NOTE — ED Provider Notes (Signed)
History     CSN: [id number]  Arrival date & time 09/04/11  1659   First MD Initiated Contact with Patient 09/04/11 1709      Chief Complaint  Patient presents with  . Ankle Pain    HPI Pt was seen at 1710.  Per pt, c/o gradual onset and worsening of constant left calf "pain" for the past 1+ weeks.  Describes the pain as "cramping."  Pain worsens with ambulation/weight bearing.  Denies injury, no back pain, no abd pain, no joints pain, no rash, no fevers, no swelling.   PMD:  Dr. Juanetta Gosling Past Medical History  Diagnosis Date  . Migraines   . Hypoglycemia   . Seizures   . Chronic knee pain   . Chronic ankle pain     History reviewed. No pertinent past surgical history.   Family History  Problem Relation Age of Onset  . Diabetes      History  Substance Use Topics  . Smoking status: Current Everyday Smoker    Types: Cigarettes  . Smokeless tobacco: Not on file  . Alcohol Use: Yes     3 or 4 times a week    Review of Systems ROS: Statement: All systems negative except as marked or noted in the HPI; Constitutional: Negative for fever and chills. ; ; Eyes: Negative for eye pain, redness and discharge. ; ; ENMT: Negative for ear pain, hoarseness, nasal congestion, sinus pressure and sore throat. ; ; Cardiovascular: Negative for chest pain, palpitations, diaphoresis, dyspnea and peripheral edema. ; ; Respiratory: Negative for cough, wheezing and stridor. ; ; Gastrointestinal: Negative for nausea, vomiting, diarrhea, abdominal pain, blood in stool, hematemesis, jaundice and rectal bleeding. . ; ; Genitourinary: Negative for dysuria, flank pain and hematuria. ; ; Musculoskeletal: +left leg pain. Negative for back pain and neck pain. Negative for swelling and trauma.; ; Skin: Negative for pruritus, rash, abrasions, blisters, bruising and skin lesion.; ; Neuro: Negative for headache, lightheadedness and neck stiffness. Negative for weakness, altered level of consciousness , altered  mental status, extremity weakness, paresthesias, involuntary movement, seizure and syncope.     Allergies  Review of patient's allergies indicates no known allergies.  Home Medications   Current Outpatient Rx  Name Route Sig Dispense Refill  . ALPRAZOLAM 2 MG PO TABS Oral Take 2 mg by mouth daily.    Marland Kitchen NAPROXEN 500 MG PO TABS Oral Take 500 mg by mouth 3 (three) times daily.    . SUMATRIPTAN SUCCINATE 6 MG/0.5ML [place] SOLN Subcutaneous Inject 6 mg into the skin every 2 (two) hours as needed. Migraines      BP 139/95  Pulse 87  Temp(Src) 98.1 F (36.7 C) (Oral)  Resp 18  Ht 5\' 10"  (1.778 m)  Wt 135 lb (61.236 kg)  BMI 19.37 kg/m2  SpO2 98%  Physical Exam 1715: Physical examination:  Nursing notes reviewed; Vital signs and O2 SAT reviewed;  Constitutional: Well developed, Well nourished, Well hydrated, In no acute distress; Head:  Normocephalic, atraumatic; Eyes: EOMI, PERRL, No scleral icterus; ENMT: Mouth and pharynx normal, Mucous membranes moist; Neck: Supple, Full range of motion, No lymphadenopathy; Cardiovascular: Regular rate and rhythm, No murmur or gallop; Respiratory: Breath sounds clear & equal bilaterally, No rales, rhonchi, wheezes, speaking full sentences with ease, Normal respiratory effort/excursion; Chest: Nontender, Movement normal;; Extremities: +left calf tender to palp without edema, erythema, ecchymosis or open wounds.  No calf edema or asymmetry.  FROM left knee, ankle and foot, including able to lift  extended LLE off stretcher, and extend left lower leg against resistance.  No ligamentous laxity.  No patellar or quad tendon step-offs.  NMS intact left foot.  No proximal fibular head tenderness.  LLE muscle compartments soft.  No left knee, ankle or foot deformity, no ecchymosis, no erythema, no edema, no open wounds.  +plantarflexion of left foot w/calf squeeze.  No palpable gap left Achilles's tendon..; Neuro: AA&Ox3, Major CN grossly intact. Speech clear. No gross  focal motor or sensory deficits in extremities.; Skin: Color normal, Warm, Dry, no rash.    ED Course  Procedures   1720:  Will check I-stat chem and Vasc US to check for DVT and take possible look at arterial system (Tech is still in hospital) given that pt has increasing pain in calf with movement of knee or ankle/foot, with left foot slightly cooler than right but is not mottled or cyanotic.  Bedside handheld dopper pulses LLE:  Loud biphasic femoral artery, faint monophasic popliteal artery, faint monophasic PT and AT arteries.  Potassium repleted PO, percocet given for pain.   1845:  No change in assessment.  Dx testing d/w pt.  Questions answered.  Verb understanding, agreeable to admit/transfer to [facility].  T/C to Vascular Surgeon Dr. Myra Gianotti, case discussed, including:  HPI, pertinent PM/SHx, VS/PE, dx testing, ED course and treatment:  Agreeable to accept transfer/admit to [facility], requests to obtain medical floor bed on 2000.    MDM  MDM Reviewed: nursing note, vitals and previous chart Reviewed previous: MRI and x-ray Interpretation: ultrasound and labs     Results for orders placed during the hospital encounter of 09/04/11  POCT I-STAT, CHEM 8      Component Value Range   Sodium 141  135 - 145 (mEq/L)   Potassium 2.7 (*) 3.5 - 5.1 (mEq/L)   Chloride 101  96 - 112 (mEq/L)   BUN 9  6 - 23 (mg/dL)   Creatinine, Ser 1.61  0.50 - 1.35 (mg/dL)   Glucose, Bld 83  70 - 99 (mg/dL)   Calcium, Ion 0.96  0.45 - 1.32 (mmol/L)   TCO2 26  0 - 100 (mmol/L)   Hemoglobin 17.7 (*) 13.0 - 17.0 (g/dL)   HCT 40.9  81.1 - 91.4 (%)   US Venous Img Lower Unilateral Left 09/04/2011  *RADIOLOGY REPORT*  Clinical Data: pain, r/o DVT;;  LEFT LOWER EXTREMITY VENOUS DUPLEX ULTRASOUND  Technique: Gray-scale sonography with compression, as well as color and duplex ultrasound, were performed to evaluate the deep venous system from the level of the common femoral vein through the popliteal and proximal calf veins.   Comparison: None  Findings:  Normal compressibility and normal Doppler signal within the common femoral, superficial femoral and popliteal veins, down to the proximal calf veins.  No grayscale filling defects to suggest DVT.  Incidentally noted is occlusion of the arterial system beginning near Hunter's canal extending through the popliteal artery.  A small amount of monophasic flow is seen within the anterior tibial artery in the proximal to mid calf.  IMPRESSION: No evidence of left lower extremity deep vein thrombosis.  Significant arterial disease.  No flow noted within the popliteal artery and distal superficial femoral artery, extending into the calf trifurcation vessels.  This can be further evaluated with dedicated segmental arterial study or CTA/MRA.  Original Report Authenticated By: Cyndie Chime, M.D.          Laray Anger, DO 09/05/11 1550

## 2011-09-04 NOTE — H&P (Signed)
[facility] [place] HISTORY AND PHYSICAL   Brady Bell  [id number] May 13, 1955   CC: worsening leg pain Referring Physician: [facility]  History of Present Illness: This is a 56 y.o. male who presented to [facility] ED with worsening pain in his left leg.  It started ~ 7-10 days ago and has progressively gotten worse.  He states that he does have cramping in his calf, but is not specific to walking or rest.  The only thing that makes it better is pain medication.    He states that he fell on his left knee about 1.5 years ago and he broke his right ankle back in Jan of this year.  He does smoke 1 ppd for 35-40 years.  Denies HTN and states that his BP is low and denies hypercholesterolemia.  States he has had some swelling of his left leg, but this is improved in the mornings when he awakes.   Past Medical History  Diagnosis Date  . Migraines   . Hypoglycemia   . Seizures   . Chronic knee pain   . Chronic ankle pain     History reviewed. No pertinent past surgical history.  No Known Allergies  Prior to Admission medications   Medication Sig Start Date End Date Taking? Authorizing Provider  alprazolam Prudy Feeler) 2 MG tablet Take 2 mg by mouth at bedtime.    Yes Historical Provider, MD  SUMAtriptan (IMITREX) 6 MG/0.5ML SOLN injection Inject 6 mg into the skin every 2 (two) hours as needed. For migraines.   Yes Historical Provider, MD  topiramate (TOPAMAX) 100 MG tablet Take 200 mg by mouth daily.   Yes Historical Provider, MD    History   Social History  . Marital Status: Single    Spouse Name: N/A    Number of Children: N/A  . Years of Education: N/A   Occupational History  . Not on file.   Social History Main Topics  . Smoking status: Current Everyday Smoker    Types: Cigarettes  . Smokeless tobacco: Not on file  . Alcohol Use: Yes     3 or 4 times a week  . Drug Use: Yes    Special: Marijuana  . Sexually Active: Yes   Other Topics Concern  . Not  on file   Social History Narrative  . No narrative on file    Family History  Problem Relation Age of Onset  . Diabetes       ROS: [x]  Positive   [ ]  Negative   [ ]  All sytems reviewed and are negative  Cardiovascular: [] chest pain; [] chest pressure; [] palpitations; [] SOB lying flat; [] DOE; [x] pain in legs with walking; [x] pain in legs when lying flat; [] Hx of DVT; [] Hx phlebitis; [x] swelling in legs in left leg; [] varicose veins Pulmonary: [] productive cough; [] asthma; [] wheezing Neurologic: [] Hx CVA;  [] weakness in arms or legs; [] numbness in arms or legs; [] difficulty in speaking or slurred speech; [] temporary loss of vision in one eye; [] dizziness; [x]  seizures for about 3 years off and on Hematologic:  [] bleeding problems; [] clots easily GI:  [] vomiting blood; []  blood in stool; [] PUD GU: []  Dysuria; [] hematuria; denies nocturia Psychiatric:  [x]  Depression due to death of mother earlier this year Integumentary:  [] rashes; [] ulcers Constitutional:  [] fever; [x] chills-occasional   Physical Examination  Filed Vitals:   09/04/11 2051  BP: 143/99  Pulse: 75  Temp: 97.7 F (36.5 C)  Resp: 18    Body mass index is 17.18 kg/(m^2).  General:  WDWN in mild distress when moving left leg Gait: Not observed. HENT: WNL Eyes: Pupils equal Pulmonary: normal non-labored breathing , without Rales, rhonchi,  wheezing Cardiac: RRR, without  Murmurs, rubs or gallops; No carotid bruits Abdomen: soft, NT, no masses Skin: no rashes, ulcers noted Vascular Exam/Pulses:+ palpable radial pulses bilaterally; 2+ palpable femoral pulses bilaterally.  + palpable right DP/PT; there is a monophasic DP doppler signal on the left. He does have decreased sensation on the plantar surface of the foot.  He can move his left foot, but this is limited compared to the right Extremities:  without ischemic changes, no Gangrene , no cellulitis; no open wounds;  Musculoskeletal: no muscle wasting or  atrophy  Neurologic: A&O X 3; Appropriate Affect ; SENSATION: normal; MOTOR FUNCTION:  moving all extremities equally. Speech is fluent/normal  Non-Invasive Vascular Imaging: none  ASSESSMENT/PLAN: This is a 56 y.o. male who is an everyday smoker.  He presents with calf pain that has progressively gotten worse over the past 7-10 days.  -He does have decreased sensation and motor function in the left foot. There is only a monophasic pulse in the left DP. -will set up for an arteriogram tomorrow to evaluate anatomy.   Doreatha Massed, PA-C [facility] [phone]   I agree with the above.  I have examined and interviewed the patient.  He has palpable femoral pulses and non-dopplerable pedal pulses on the left with decreased motor and sensory function of the left foot with calf tenderness.  This has been going on for approximately one week.  His symptoms on arrival are not significantly different from one week ago.  I am starting him on heparin with plans for angiography tomorrow with one of my partners to plan his options for revascularization.  I discussed that this is a limb threatening situation.  He fully grasps the magnitude of the situation and realizes amputation is a possibility.  Durene Cal

## 2011-09-04 NOTE — ED Notes (Signed)
[place] tech performing bedside venous doppler exam.

## 2011-09-04 NOTE — ED Notes (Addendum)
Pt reports left ankle injury approx 1 1/2 years ago.  Reports for the past week has had problems with cramping in left calf and numbness in left foot.  Reports has been falling a lot because hurts left ankle and knee to stand.  Pedal pulse present but left lower leg cooler to touch than right.  Dr. Clarene Duke verified pedal pulse.

## 2011-09-04 NOTE — ED Notes (Signed)
Dr. Clarene Duke verified pulses in left leg and foot with doppler.

## 2011-09-05 ENCOUNTER — Inpatient Hospital Stay (HOSPITAL_COMMUNITY): Payer: Medicaid Other

## 2011-09-05 ENCOUNTER — Encounter (HOSPITAL_COMMUNITY)
Admission: EM | Disposition: A | Discharge status: Home / Self Care | Payer: Self-pay | Source: Home / Self Care | Attending: Vascular Surgery

## 2011-09-05 DIAGNOSIS — M79609 Pain in unspecified limb: Secondary | ICD-10-CM

## 2011-09-05 DIAGNOSIS — I739 Peripheral vascular disease, unspecified: Secondary | ICD-10-CM

## 2011-09-05 HISTORY — PX: LOWER EXTREMITY ANGIOGRAM: SHX5508

## 2011-09-05 LAB — URINALYSIS, ROUTINE W REFLEX MICROSCOPIC
Hgb urine dipstick: NEGATIVE
Leukocytes, UA: NEGATIVE
Nitrite: NEGATIVE
Protein, ur: NEGATIVE mg/dL
Specific Gravity, Urine: 1.027 (ref 1.005–1.030)
Urobilinogen, UA: 1 mg/dL (ref 0.0–1.0)

## 2011-09-05 LAB — TYPE AND SCREEN: Antibody Screen: NEGATIVE

## 2011-09-05 LAB — HEPARIN LEVEL (UNFRACTIONATED): Heparin Unfractionated: <0.1 IU/mL — ABNORMAL LOW (ref 0.30–0.70)

## 2011-09-05 SURGERY — ANGIOGRAM, LOWER EXTREMITY
Anesthesia: LOCAL

## 2011-09-05 SURGERY — Surgical Case
Anesthesia: *Unknown

## 2011-09-05 MED ORDER — METOPROLOL TARTRATE 1 MG/ML IV SOLN
2.5000 mg | Freq: Once | INTRAVENOUS | Status: AC
  Administered 2011-09-05: 2.5 mg via INTRAVENOUS

## 2011-09-05 MED ORDER — METOPROLOL TARTRATE 1 MG/ML IV SOLN
INTRAVENOUS | Status: AC
  Filled 2011-09-05: qty 5

## 2011-09-05 MED ORDER — LIDOCAINE HCL (PF) 1 % IJ SOLN
INTRAMUSCULAR | Status: AC
  Filled 2011-09-05: qty 30

## 2011-09-05 MED ORDER — HEPARIN BOLUS VIA INFUSION
1500.0000 [IU] | Freq: Once | INTRAVENOUS | Status: AC
  Administered 2011-09-05: 1500 [IU] via INTRAVENOUS
  Filled 2011-09-05: qty 1500

## 2011-09-05 MED ORDER — SODIUM CHLORIDE 0.9 % IV SOLN: 1.0000 mL/kg/h | INTRAVENOUS | Status: DC

## 2011-09-05 MED ORDER — MORPHINE SULFATE 4 MG/ML IJ SOLN
INTRAMUSCULAR | Status: AC
  Filled 2011-09-05: qty 1

## 2011-09-05 MED ORDER — FENTANYL CITRATE 0.05 MG/ML IJ SOLN
INTRAMUSCULAR | Status: AC
  Filled 2011-09-05: qty 2

## 2011-09-05 MED ORDER — MORPHINE SULFATE 2 MG/ML IJ SOLN
2.0000 mg | Freq: Once | INTRAMUSCULAR | Status: AC
  Administered 2011-09-05: 4 mg via INTRAVENOUS

## 2011-09-05 MED ORDER — ACETAMINOPHEN 325 MG PO TABS: 650.0000 mg | ORAL_TABLET | ORAL | Status: DC | PRN

## 2011-09-05 MED ORDER — MORPHINE SULFATE 2 MG/ML IJ SOLN
2.0000 mg | INTRAMUSCULAR | Status: DC | PRN
  Administered 2011-09-05: 2 mg via INTRAVENOUS
  Administered 2011-09-05: 4 mg via INTRAVENOUS
  Administered 2011-09-05 – 2011-09-06 (×4): 2 mg via INTRAVENOUS
  Administered 2011-09-06 (×3): 4 mg via INTRAVENOUS
  Administered 2011-09-06 (×2): 2 mg via INTRAVENOUS
  Administered 2011-09-06 – 2011-09-07 (×2): 4 mg via INTRAVENOUS
  Administered 2011-09-07 (×2): 2 mg via INTRAVENOUS
  Administered 2011-09-07: 4 mg via INTRAVENOUS
  Administered 2011-09-07: 2 mg via INTRAVENOUS
  Administered 2011-09-07: 4 mg via INTRAVENOUS
  Administered 2011-09-07 – 2011-09-09 (×8): 2 mg via INTRAVENOUS
  Filled 2011-09-05 (×7): qty 1
  Filled 2011-09-05: qty 2
  Filled 2011-09-05: qty 1
  Filled 2011-09-05: qty 2
  Filled 2011-09-05 (×8): qty 1
  Filled 2011-09-05: qty 2

## 2011-09-05 MED ORDER — MIDAZOLAM HCL 2 MG/2ML IJ SOLN
INTRAMUSCULAR | Status: AC
  Filled 2011-09-05: qty 2

## 2011-09-05 MED ORDER — HEPARIN (PORCINE) IN NACL 2-0.9 UNIT/ML-% IJ SOLN
INTRAMUSCULAR | Status: AC
  Filled 2011-09-05: qty 1000

## 2011-09-05 MED ORDER — ONDANSETRON HCL 4 MG/2ML IJ SOLN: 4.0000 mg | Freq: Four times a day (QID) | INTRAMUSCULAR | Status: DC | PRN

## 2011-09-05 MED ORDER — SODIUM CHLORIDE 0.9 % IV SOLN
INTRAVENOUS | Status: DC
  Administered 2011-09-05 – 2011-09-06 (×2): via INTRAVENOUS

## 2011-09-05 NOTE — Progress Notes (Signed)
Patient ID: Brady Bell, male   DOB: 1956-03-17, 56 y.o.   MRN: [id number] Reviewed the arteriogram with the patient. 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 He is scheduled for surgery this evening with Dr. Imogene Burn for femoral distal revascularization. He understands the procedure including potential for graft failure and limb loss.

## 2011-09-05 NOTE — CV Procedure (Signed)
OPERATIVE REPORT  DATE OF SURGERY: 09/05/2011  PATIENT: Brady Bell, 56 y.o. male MRN: [id number]  DOB: 04/22/1955  PRE-OPERATIVE DIAGNOSIS: Foot ischemia, left  POST-OPERATIVE DIAGNOSIS:  Same  PROCEDURE: Aortogram with bilateral lower extremity runoff  SURGEON:  Gretta Began, M.D.    ANESTHESIA:  1% local with sedation  EBL: Minimal ml  Total I/O In: 81 [I.V.:81] Out: 150 [Urine:150]  BLOOD ADMINISTERED: None  DRAINS: None  SPECIMEN:  none none  COUNTS CORRECT:  YES  PLAN OF CARE: Holding area   PATIENT DISPOSITION:  PACU - hemodynamically stable  PROCEDURE DETAILS: The patient was taken to the peripheral vascular cath lab and placed in supine position where the area of both groins were prepped and draped in the usual sterile fashion. Using local anesthesia and a single wall puncture the right common femoral artery was entered and a guidewire passed up to the level of the suprarenal aorta. A 5 French sheath was passed over the guidewire. A pigtail catheter is positioned level of the suprarenal aorta and an AP projection contrast was obtained. This revealed 2 left renal arteries and 1 right renal artery which were all widely patent the infrarenal aorta was widely patent as well. There was calcification in the common iliac arteries bilaterally but no evidence of flow limiting stenosis. The internal and external iliac arteries were widely patent. On the right leg there was widely patent superficial femoral artery throughout the thigh. The profundus femoris artery was also widely patent. There was three-vessel runoff on the right with the posterior tibial being the dominant runoff vessel  Left leg runoff revealed widely patent or fundus or murmurs artery and proximal superficial femoral artery. There was a stenosis in the mid suprasternal artery and complete occlusion at the adductor canal. The popliteal artery was completely occluded. There was reconstitution of the anterior  tibial and peroneal arteries distal to their takeoff. These were taken down to the ankle and the peroneal artery had collateral reconstitution of the posterior tibial at the ankle. The anterior tibial reconstituted posterior tibial both across the ankle into the foot. The patient tolerated the complication and was transferred to the holding area where the 5 [place] sheath was pulled and hemostasis obtained with a running pressure  Findings, #1 widely patent aorta iliac segments #2 patent right superficial femoral popliteal and three-vessel runoff #3 stenosis in mid left SFA and complete occlusion of the SFA at the adductor canal and complete occlusion of the popliteal artery #4 reconstitution of the left anterior tibial and peroneal arteries proximally and reconstitution of distal posterior tibial artery via peroneal collaterals with anterior tibial and posterior tibial runoff into the foot   Gretta Began, M.D. 09/05/2011 2:17 PM

## 2011-09-05 NOTE — Progress Notes (Signed)
VASCULAR LAB PRELIMINARY  PRELIMINARY  PRELIMINARY  PRELIMINARY  Right Lower Extremity Vein Map    Right Great Saphenous Vein   Segment Diameter Comment  1. Origin 4.8 mm   2. High Thigh 2.5 mm   3. Mid Thigh 2.8 mm   4. Low Thigh 2.7 mm   5. At Knee 1.8 mm   6. High Calf 2.3 mm   7. Low Calf 1.8 mm branch  8. Ankle 2.4 mm    mm    mm    mm    Left Lower Extremity Vein Map    Left Great Saphenous Vein   Segment Diameter Comment  1. Origin 5.2 mm   2. High Thigh 4 mm   3. Mid Thigh 2.7 mm branch  4. Low Thigh 3.1 mm   5. At Knee 3 mm   6. High Calf 4 mm   7. Low Calf 3.2 mm branch  8. Ankle 2.5 mm    mm    mm    mm     Brady Bell, 09/05/2011, 5:32 PM

## 2011-09-05 NOTE — Progress Notes (Signed)
Vital signs reviewed.  Consistent with previous values.  Continuing to monitor.

## 2011-09-05 NOTE — Progress Notes (Signed)
ANTICOAGULATION CONSULT NOTE - Follow Up Consult  Pharmacy Consult for Heparin Indication: Arterial ischemia  No Known Allergies  Patient Measurements: Height: 5\' 10"  (177.8 cm) Weight: 119 lb 11.4 oz (54.3 kg) IBW/kg (Calculated) : 73  Heparin Dosing Weight: 54.3kg  Vital Signs: Temp: 97.7 F (36.5 C) (06/07 0414) Temp src: Oral (06/07 0414) BP: 142/98 mmHg (06/07 0414) Pulse Rate: 84  (06/07 0414)  Labs:  Basename 09/05/11 0635 09/04/11 2209 09/04/11 1730  HGB -- 17.2* 17.7*  HCT -- 51.7 52.0  PLT -- 191 --  APTT -- -- --  LABPROT -- 12.8 --  INR -- 0.94 --  HEPARINUNFRC <0.10* -- --  CREATININE -- 0.95 1.00  CKTOTAL -- -- --  CKMB -- -- --  TROPONINI -- -- --    Estimated Creatinine Clearance: 69.1 ml/min (by C-G formula based on Cr of 0.95).   Medications:  Heparin 650 units/hr  Assessment: 55yom on heparin for arterial ischemia while awaiting arteriogram scheduled for today (6/7). Heparin level (<0.1) is subtherapeutic.  - H/H and Plts wnl - No significant bleeding reported  Goal of Therapy:  Heparin level 0.3-0.7 units/ml Monitor platelets by anticoagulation protocol: Yes   Plan:  1. Heparin IV bolus 1500 units x 1 2. Increase heparin drip to 850 units/hr (8.5 ml/hr) 3. Check heparin level 6 hours after rate increase or follow-up post op orders as heparin will be turned off prior to arteriogram  Cleon Dew [phone] 09/05/2011,8:43 AM

## 2011-09-05 NOTE — Progress Notes (Signed)
Patient ID: Brady Bell, male   DOB: 1955-10-21, 56 y.o.   MRN: [id number] Addendum:  The patient has been re-examined and re-evaluated.  The patient's history and physical has been reviewed and is unchanged.    Brady Bell is a 56 y.o. male is being admitted with Arterial occlusion [444.9] Left leg pain [729.5] Lt lwr leg pain. All the risks, benefits and other treatment options have been discussed with the patient. The patient has consented to proceed with Procedure(s): LOWER EXTREMITY ANGIOGRAM as a surgical intervention.  Madalyn Legner 09/05/2011 11:19 AM [facility]

## 2011-09-06 ENCOUNTER — Encounter (HOSPITAL_COMMUNITY): Payer: Self-pay | Admitting: *Deleted

## 2011-09-06 ENCOUNTER — Encounter (HOSPITAL_COMMUNITY)
Admission: EM | Disposition: A | Discharge status: Home / Self Care | Payer: Self-pay | Source: Home / Self Care | Attending: Vascular Surgery

## 2011-09-06 ENCOUNTER — Inpatient Hospital Stay (HOSPITAL_COMMUNITY): Payer: Medicaid Other | Admitting: *Deleted

## 2011-09-06 DIAGNOSIS — I70219 Atherosclerosis of native arteries of extremities with intermittent claudication, unspecified extremity: Secondary | ICD-10-CM

## 2011-09-06 HISTORY — PX: FEMORAL-TIBIAL BYPASS GRAFT: SHX938

## 2011-09-06 HISTORY — PX: FASCIOTOMY: SHX132

## 2011-09-06 HISTORY — PX: PR VEIN BYPASS GRAFT,AORTO-FEM-POP: 35551

## 2011-09-06 SURGERY — CREATION, BYPASS, ARTERIAL, FEMORAL TO TIBIAL, USING GRAFT
Anesthesia: General | Site: Leg Upper | Laterality: Left

## 2011-09-06 MED ORDER — ONDANSETRON HCL 4 MG/2ML IJ SOLN: 4.0000 mg | Freq: Four times a day (QID) | INTRAMUSCULAR | Status: DC | PRN

## 2011-09-06 MED ORDER — ROCURONIUM BROMIDE 100 MG/10ML IV SOLN
INTRAVENOUS | Status: DC | PRN
  Administered 2011-09-06: 50 mg via INTRAVENOUS

## 2011-09-06 MED ORDER — VECURONIUM BROMIDE 10 MG IV SOLR
INTRAVENOUS | Status: DC | PRN
  Administered 2011-09-06 (×3): 1 mg via INTRAVENOUS
  Administered 2011-09-06: 3 mg via INTRAVENOUS
  Administered 2011-09-06 (×2): 1 mg via INTRAVENOUS

## 2011-09-06 MED ORDER — PHENYLEPHRINE HCL 10 MG/ML IJ SOLN
INTRAMUSCULAR | Status: DC | PRN
  Administered 2011-09-06 (×3): 40 ug via INTRAVENOUS
  Administered 2011-09-06: 80 ug via INTRAVENOUS
  Administered 2011-09-06 (×2): 40 ug via INTRAVENOUS

## 2011-09-06 MED ORDER — SODIUM CHLORIDE 0.9 % IV SOLN: 500.0000 mL | Freq: Once | INTRAVENOUS | Status: AC | PRN

## 2011-09-06 MED ORDER — LIDOCAINE HCL (CARDIAC) 20 MG/ML IV SOLN
INTRAVENOUS | Status: DC | PRN
  Administered 2011-09-06: 100 mg via INTRAVENOUS

## 2011-09-06 MED ORDER — METOPROLOL TARTRATE 1 MG/ML IV SOLN: 2.0000 mg | INTRAVENOUS | Status: DC | PRN

## 2011-09-06 MED ORDER — HYDROMORPHONE HCL PF 1 MG/ML IJ SOLN
INTRAMUSCULAR | Status: AC
  Administered 2011-09-06: 0.5 mg via INTRAVENOUS
  Filled 2011-09-06: qty 1

## 2011-09-06 MED ORDER — PROTAMINE SULFATE 10 MG/ML IV SOLN
INTRAVENOUS | Status: DC | PRN
  Administered 2011-09-06: 50 mg via INTRAVENOUS

## 2011-09-06 MED ORDER — ONDANSETRON HCL 4 MG/2ML IJ SOLN
INTRAMUSCULAR | Status: DC | PRN
  Administered 2011-09-06: 4 mg via INTRAVENOUS

## 2011-09-06 MED ORDER — HYDRALAZINE HCL 20 MG/ML IJ SOLN
10.0000 mg | INTRAMUSCULAR | Status: DC | PRN
  Filled 2011-09-06: qty 0.5

## 2011-09-06 MED ORDER — PANTOPRAZOLE SODIUM 40 MG PO TBEC
40.0000 mg | DELAYED_RELEASE_TABLET | Freq: Every day | ORAL | Status: DC
  Administered 2011-09-06 – 2011-09-10 (×5): 40 mg via ORAL
  Filled 2011-09-06 (×5): qty 1

## 2011-09-06 MED ORDER — CEFAZOLIN SODIUM 1-5 GM-% IV SOLN
INTRAVENOUS | Status: DC | PRN
  Administered 2011-09-06 (×2): 1 g via INTRAVENOUS

## 2011-09-06 MED ORDER — NICOTINE 21 MG/24HR TD PT24
21.0000 mg | MEDICATED_PATCH | TRANSDERMAL | Status: DC
  Administered 2011-09-06 – 2011-09-11 (×6): 21 mg via TRANSDERMAL
  Filled 2011-09-06 (×6): qty 1

## 2011-09-06 MED ORDER — ENOXAPARIN SODIUM 40 MG/0.4ML ~~LOC~~ SOLN
40.0000 mg | SUBCUTANEOUS | Status: DC
  Administered 2011-09-07 – 2011-09-11 (×5): 40 mg via SUBCUTANEOUS
  Filled 2011-09-06 (×7): qty 0.4

## 2011-09-06 MED ORDER — DOCUSATE SODIUM 100 MG PO CAPS
100.0000 mg | ORAL_CAPSULE | Freq: Every day | ORAL | Status: DC
  Administered 2011-09-09 – 2011-09-11 (×3): 100 mg via ORAL
  Filled 2011-09-06 (×5): qty 1

## 2011-09-06 MED ORDER — PHENOL 1.4 % MT LIQD: 1.0000 | OROMUCOSAL | Status: DC | PRN

## 2011-09-06 MED ORDER — SODIUM CHLORIDE 0.9 % IV SOLN
INTRAVENOUS | Status: DC
  Administered 2011-09-06 – 2011-09-08 (×4): via INTRAVENOUS

## 2011-09-06 MED ORDER — MIDAZOLAM HCL 5 MG/5ML IJ SOLN
INTRAMUSCULAR | Status: DC | PRN
  Administered 2011-09-06: 2 mg via INTRAVENOUS

## 2011-09-06 MED ORDER — DEXTROSE 5 % IV SOLN
1.5000 g | Freq: Two times a day (BID) | INTRAVENOUS | Status: AC
  Administered 2011-09-06 – 2011-09-07 (×2): 1.5 g via INTRAVENOUS
  Filled 2011-09-06 (×2): qty 1.5

## 2011-09-06 MED ORDER — CEFAZOLIN SODIUM 1-5 GM-% IV SOLN
INTRAVENOUS | Status: AC
  Filled 2011-09-06: qty 50

## 2011-09-06 MED ORDER — THROMBIN 20000 UNITS EX KIT
PACK | CUTANEOUS | Status: DC | PRN
  Administered 2011-09-06: 12:00:00 via TOPICAL

## 2011-09-06 MED ORDER — POTASSIUM CHLORIDE CRYS ER 20 MEQ PO TBCR: 20.0000 meq | EXTENDED_RELEASE_TABLET | Freq: Every day | ORAL | Status: DC | PRN

## 2011-09-06 MED ORDER — DROPERIDOL 2.5 MG/ML IJ SOLN: 0.6250 mg | INTRAMUSCULAR | Status: DC | PRN

## 2011-09-06 MED ORDER — FENTANYL CITRATE 0.05 MG/ML IJ SOLN
INTRAMUSCULAR | Status: DC | PRN
  Administered 2011-09-06 (×8): 50 ug via INTRAVENOUS
  Administered 2011-09-06: 100 ug via INTRAVENOUS

## 2011-09-06 MED ORDER — MORPHINE SULFATE 2 MG/ML IJ SOLN
2.0000 mg | INTRAMUSCULAR | Status: DC | PRN
  Administered 2011-09-07 – 2011-09-09 (×6): 2 mg via INTRAVENOUS
  Administered 2011-09-09: 4 mg via INTRAVENOUS
  Filled 2011-09-06 (×2): qty 2
  Filled 2011-09-06 (×4): qty 1
  Filled 2011-09-06: qty 2
  Filled 2011-09-06 (×2): qty 1
  Filled 2011-09-06: qty 2
  Filled 2011-09-06 (×3): qty 1
  Filled 2011-09-06: qty 2
  Filled 2011-09-06: qty 1

## 2011-09-06 MED ORDER — ACETAMINOPHEN 650 MG RE SUPP: 325.0000 mg | RECTAL | Status: DC | PRN

## 2011-09-06 MED ORDER — 0.9 % SODIUM CHLORIDE (POUR BTL) OPTIME
TOPICAL | Status: DC | PRN
  Administered 2011-09-06: 1000 mL

## 2011-09-06 MED ORDER — ALUM & MAG HYDROXIDE-SIMETH 200-200-20 MG/5ML PO SUSP: 15.0000 mL | ORAL | Status: DC | PRN

## 2011-09-06 MED ORDER — LACTATED RINGERS IV SOLN
INTRAVENOUS | Status: DC | PRN
  Administered 2011-09-06 (×3): via INTRAVENOUS

## 2011-09-06 MED ORDER — ACETAMINOPHEN 325 MG PO TABS
325.0000 mg | ORAL_TABLET | ORAL | Status: DC | PRN
  Filled 2011-09-06: qty 2

## 2011-09-06 MED ORDER — LABETALOL HCL 5 MG/ML IV SOLN
10.0000 mg | INTRAVENOUS | Status: DC | PRN
  Filled 2011-09-06: qty 4

## 2011-09-06 MED ORDER — PROPOFOL 10 MG/ML IV EMUL
INTRAVENOUS | Status: DC | PRN
  Administered 2011-09-06: 200 mg via INTRAVENOUS

## 2011-09-06 MED ORDER — GLYCOPYRROLATE 0.2 MG/ML IJ SOLN
INTRAMUSCULAR | Status: DC | PRN
  Administered 2011-09-06: .5 mg via INTRAVENOUS

## 2011-09-06 MED ORDER — SODIUM CHLORIDE 0.9 % IR SOLN
Status: DC | PRN
  Administered 2011-09-06: 10:00:00

## 2011-09-06 MED ORDER — HYDROMORPHONE HCL PF 1 MG/ML IJ SOLN
0.2500 mg | INTRAMUSCULAR | Status: DC | PRN
  Administered 2011-09-06 (×4): 0.5 mg via INTRAVENOUS

## 2011-09-06 MED ORDER — NEOSTIGMINE METHYLSULFATE 1 MG/ML IJ SOLN
INTRAMUSCULAR | Status: DC | PRN
  Administered 2011-09-06: 3 mg via INTRAVENOUS

## 2011-09-06 MED ORDER — GUAIFENESIN-DM 100-10 MG/5ML PO SYRP: 15.0000 mL | ORAL_SOLUTION | ORAL | Status: DC | PRN

## 2011-09-06 MED ORDER — HEPARIN SODIUM (PORCINE) 1000 UNIT/ML IJ SOLN
INTRAMUSCULAR | Status: DC | PRN
  Administered 2011-09-06: 2000 [IU] via INTRAVENOUS
  Administered 2011-09-06: 5000 [IU] via INTRAVENOUS

## 2011-09-06 SURGICAL SUPPLY — 56 items
ADH SKN CLS LQ APL DERMABOND (GAUZE/BANDAGES/DRESSINGS) ×9
BLADE SURG 10 STRL SS (BLADE) ×2 IMPLANT
CANISTER SUCTION 2500CC (MISCELLANEOUS) ×4 IMPLANT
CLIP TI MEDIUM 24 (CLIP) ×4 IMPLANT
CLIP TI WIDE RED SMALL 24 (CLIP) ×8 IMPLANT
CLOTH BEACON ORANGE TIMEOUT ST (SAFETY) ×4 IMPLANT
COVER PROBE W GEL 5X96 (DRAPES) ×2 IMPLANT
COVER SURGICAL LIGHT HANDLE (MISCELLANEOUS) ×8 IMPLANT
DERMABOND ADHESIVE PROPEN (GAUZE/BANDAGES/DRESSINGS) ×3
DERMABOND ADVANCED .7 DNX6 (GAUZE/BANDAGES/DRESSINGS) ×3 IMPLANT
DRAIN CHANNEL 15F RND FF W/TCR (WOUND CARE) IMPLANT
DRAPE WARM FLUID 44X44 (DRAPE) ×4 IMPLANT
DRSG COVADERM 4X10 (GAUZE/BANDAGES/DRESSINGS) ×2 IMPLANT
DRSG COVADERM 4X14 (GAUZE/BANDAGES/DRESSINGS) ×2 IMPLANT
DRSG COVADERM 4X6 (GAUZE/BANDAGES/DRESSINGS) ×2 IMPLANT
DRSG COVADERM 4X8 (GAUZE/BANDAGES/DRESSINGS) ×2 IMPLANT
ELECT REM PT RETURN 9FT ADLT (ELECTROSURGICAL) ×4
ELECTRODE REM PT RTRN 9FT ADLT (ELECTROSURGICAL) ×3 IMPLANT
EVACUATOR SILICONE 100CC (DRAIN) IMPLANT
GAUZE SPONGE 4X4 16PLY XRAY LF (GAUZE/BANDAGES/DRESSINGS) IMPLANT
GLOVE BIO SURGEON STRL SZ 6.5 (GLOVE) ×8 IMPLANT
GLOVE BIO SURGEON STRL SZ7 (GLOVE) ×8 IMPLANT
GLOVE BIOGEL PI IND STRL 7.0 (GLOVE) ×5 IMPLANT
GLOVE BIOGEL PI IND STRL 7.5 (GLOVE) ×3 IMPLANT
GLOVE BIOGEL PI INDICATOR 7.0 (GLOVE) ×5
GLOVE BIOGEL PI INDICATOR 7.5 (GLOVE) ×1
GLOVE ECLIPSE 6.5 STRL STRAW (GLOVE) ×2 IMPLANT
GOWN PREVENTION PLUS XXLARGE (GOWN DISPOSABLE) ×2 IMPLANT
GOWN STRL NON-REIN LRG LVL3 (GOWN DISPOSABLE) ×12 IMPLANT
HEMOSTAT SURGICEL 2X14 (HEMOSTASIS) IMPLANT
INSERT FOGARTY SM (MISCELLANEOUS) ×4 IMPLANT
KIT BASIN OR (CUSTOM PROCEDURE TRAY) ×4 IMPLANT
KIT ROOM TURNOVER OR (KITS) ×4 IMPLANT
NS IRRIG 1000ML POUR BTL (IV SOLUTION) ×8 IMPLANT
PACK PERIPHERAL VASCULAR (CUSTOM PROCEDURE TRAY) ×4 IMPLANT
PAD ARMBOARD 7.5X6 YLW CONV (MISCELLANEOUS) ×8 IMPLANT
PENCIL BUTTON HOLSTER BLD 10FT (ELECTRODE) ×2 IMPLANT
SPONGE LAP 4X18 X RAY DECT (DISPOSABLE) ×2 IMPLANT
SPONGE SURGIFOAM ABS GEL 100 (HEMOSTASIS) IMPLANT
STAPLER VISISTAT 35W (STAPLE) ×2 IMPLANT
SUT MNCRL AB 4-0 PS2 18 (SUTURE) ×8 IMPLANT
SUT PROLENE 5 0 C 1 24 (SUTURE) ×6 IMPLANT
SUT PROLENE 6 0 BV (SUTURE) ×8 IMPLANT
SUT PROLENE 7 0 BV1 MDA (SUTURE) ×2 IMPLANT
SUT SILK 2 0 FS (SUTURE) ×2 IMPLANT
SUT SILK 4 0 (SUTURE) ×12
SUT SILK 4-0 18XBRD TIE 12 (SUTURE) ×3 IMPLANT
SUT VIC AB 2-0 CT1 27 (SUTURE) ×8
SUT VIC AB 2-0 CT1 TAPERPNT 27 (SUTURE) ×6 IMPLANT
SUT VIC AB 3-0 SH 27 (SUTURE) ×12
SUT VIC AB 3-0 SH 27X BRD (SUTURE) ×7 IMPLANT
TOWEL OR 17X24 6PK STRL BLUE (TOWEL DISPOSABLE) ×8 IMPLANT
TOWEL OR 17X26 10 PK STRL BLUE (TOWEL DISPOSABLE) ×4 IMPLANT
TRAY FOLEY CATH 14FRSI W/METER (CATHETERS) ×4 IMPLANT
UNDERPAD 30X30 INCONTINENT (UNDERPADS AND DIAPERS) ×4 IMPLANT
WATER STERILE IRR 1000ML POUR (IV SOLUTION) ×4 IMPLANT

## 2011-09-06 NOTE — Preoperative (Signed)
Beta Blockers   Reason not to administer Beta Blockers:Not Applicable 

## 2011-09-06 NOTE — Transfer of Care (Signed)
Immediate Anesthesia Transfer of Care Note  Patient: Brady Bell  Procedure(s) Performed: Procedure(s) (LRB): BYPASS GRAFT FEMORAL-TIBIAL ARTERY (Left) FASCIOTOMY (Left)  Patient Location: PACU  Anesthesia Type: General  Level of Consciousness: awake, alert  and oriented  Airway & Oxygen Therapy: Patient Spontanous Breathing and Patient connected to nasal cannula oxygen  Post-op Assessment: Report given to PACU RN and Post -op Vital signs reviewed and stable  Post vital signs: Reviewed and stable  Complications: No apparent anesthesia complications

## 2011-09-06 NOTE — Op Note (Signed)
OPERATIVE NOTE   PROCEDURE: 1. Left common femoral artery to anterior tibial artery bypass with non-reversed ipslateral greater saphenous vein   PRE-OPERATIVE DIAGNOSIS: Left critical limb ischemia  POST-OPERATIVE DIAGNOSIS: same as above   SURGEON: Leonides Sake, MD  ASSISTANT(S): Della Goo, [facility]  ANESTHESIA: general  ESTIMATED BLOOD LOSS: 300 cc  FINDING(S): 1. Palpable dorsalis pedis pulse at end of case  SPECIMEN(S):  none  INDICATIONS:   Brady Bell is a 56 y.o. male who presents with acute on chronic left leg ischemia.  Patient was seen by Dr. Myra Gianotti in the ER and setup for diagnostic angiography.  Based on his angiogram he would need a left femoral to anterior tibial bypass to restore blood flow to his left foot.  He already had sensory loss without motor loss, with intact doppler signals, so he did not have a threatened limb.  The risk, benefits, and alternative for bypass operations were discussed with the patient.  The patient is aware the risks include but are not limited to: bleeding, infection, myocardial infarction, stroke, limb loss, nerve damage, need for additional procedures in the future, wound complications, and inability to complete the bypass.  The patient is aware of these risks and agreed to proceed.  DESCRIPTION: After obtain full informed written consent, the patient was brought back to the operating room and placed supine upon the operating table.  The patient received IV antibiotics prior to induction.  After obtaining adequate anesthesia, the patient was prepped and draped in the standard fashion for: a left femoral to tibial bypass.  First, a longitudinal incision was made over the left common femoral artery.  Using electrocautery and blunt dissection, the common femoral artery was dissected out from external iliac artery to femoral bifurcation.    I then turned by attention to the anterior compartment in the left calf.  I made a longitudinal incision  halfway between the tibia and fibula, and dissected through the fascia and retracted the muscle groups medially and lateral to gain exposure of the vascular structures.  There was an extensive network veins around the anterior tibial artery.  I have to ligated multiple segments of these vein to gain an accessible segment of the artery for use as a bypass target.  The artery was only 2-3 mm in diameter and somewhat diseased.  I gained control of the artery with vessel loops proximally and distally.    Then attention was turned to the greater saphenous vein.  I marked out its pathway on the skin with the help of Sonosite.  I made skip incisions over the vein and harvested the vein from the saphenofemoral junction down to the mid-calf, tying off and clipping sidebranches in the process.  Finally, I felt I had adequate length of vein conduit.  I clamped the distal greater saphenous vein and sharply transected it.  I clamped the saphenofemoral junction and sharply took off the vein conduit.  I oversewed the saphenofemoral junction with a double layer of 5-0 Prolene.  The vein conduit looked to be of reasonable quality with a segment stenosis in the mid-portion which improved with hydrodistension.  The vein was soaked in heparinized saline.  I then passed a Gore metal tunneler from the anterior compartment exposure lateral to the patella up the left groin.  The patient was given 5000 units of Heparin intravenously, which was a therapeutic bolus.  In total, 7000 units of Heparin was administrated to achieve and maintain a therapeutic level of anticoagulation.  Meanwhile, I  distended the vein conduit and tied off any loose side branches.  Some clips were replaced with silk sutures.  I also lysed the valves evident in the proximal vein with a Micro Potts scissor.  After waiting a few minutes for the Heparin to circulated, I clamped the left external iliac artery, superficial femoral artery, and profunda femoral arteries.   There was soft area in the common femoral artery directly over the profunda artery.  I made an arteriotomy here and extended it with Potts scissor.  The vein conduit was spatulated slightly to better meet the dimension of the arteriotomy.  The vein was sewn to the artery in an end to side configuration with a running stitch of 5-0 Prolene.  I released all clamps after completion, as this vein conduit was sewn in a non-reversed fashion no bleed was occurring.  I then used a valvutome to lyse the valves in this vein conduit.  There was excellent pulsatile bleeding at the end of the process.  I marked the anterior surface of this conduit to maintain orientation.  I clamped the conduit near its anastomosis and irrigated out the conduit with heparinized saline.  The distal end of conduit was tied to the inner cannula of the Gore tunneler and delivered through the tunnel.  I then removed the tunneler and sharply released the conduit from the inner cannula.    I pulled the conduit to appropriate tension and reset my exposure of the anterior tibial artery.  I placed the anterior tibial artery under tension proximally and distally with vessel loops.  I then made an arteriotomy with a 11-blade and extended it for almost 5 cm with a Pott's scissor.  The distal vein conduit was then spatulated with the Pott's scissor to meet the dimensions of this arteriotomy.  With some difficulty, the vein was sewn to the artery in an end to side configuration with a running stitch of 6-0 Prolene.  I did have to sharply readjust the geometry of the distal vein conduit in this process.  Prior to completing this anastomosis, I allowed the graft to bleed in an antegrade fashion: no clots were noted and excellent pulsatile bleeding was present.  The anterior tibial artery was also allowed to bleed from both end and reasonable blood flow was present, suggesting while possibly underperfused he still has had intact tibial blood flow.  I  subsequently elected not to perform fasciotomies.  I completed the anastomosis in the usual fashion and released all clamps and vessel loops.  I had to repair a few areas in the suture line with interrupted 6-0 Prolene stitches.  There was a palpable anterior tibial artery pulse and a palpable dorsalis pedis pulse distally at this point.    I placed thrombin and gelfoam in all wounds.  After a few minutes, all wound are irrigated.  No further active bleeding was present.  The left groin was repaired with a double layer of 2-0 Vicryl, a double layer of 3-0 Vicryl, and a running subcuticular of 4-0 Vicryl.  The anterior compartment exposure was repaired with a layer of 3-0 Vicryl and a a running subcuticular of 4-0 Vicryl.  The vein harvest incisions were repaired with a layer of 3-0 Vicryl in the subcutaneous tissue and the skin reapproximated with staples.  The groin and anterior compartment incisions were reinforced with Dermabond.  COMPLICATIONS: none  CONDITION: stable  Leonides Sake, MD [facility] [place] Office: [phone] Pager: [phone]  09/06/2011, 3:16 PM

## 2011-09-06 NOTE — Progress Notes (Signed)
Pt's bp has been very high, previous day shift rn said she called the MD about it and she was told not to worry about it. Pt's bp has consistently high, during the night,  Dr. Imogene Burn was paged twice, his assistant called back and said Dr. Imogene Burn said not to worry about the high bp now. Will continue to monitor pt.----Bethzy Hauck, D. rn

## 2011-09-06 NOTE — Interval H&P Note (Signed)
[facility]   History and Physical Update  The patient was interviewed and re-examined.  The patient's previous History and Physical has been reviewed and is unchanged except for: interval angiogram which demonstrates the best revascularization option in this patient is a Left fem-AT bypass.  The risk, benefits, and alternative for bypass operations were discussed with the patient.  The patient is aware the risks include but are not limited to: bleeding, infection, myocardial infarction, stroke, limb loss, nerve damage, need for additional procedures in the future, wound complications, and inability to complete the bypass.  The patient is aware of these risks and agreed to proceed.  Leonides Sake, MD [facility] [place] Office: [phone] Pager: [phone]  09/06/2011, 9:11 AM

## 2011-09-06 NOTE — Progress Notes (Signed)
Pt arrived from PACU and was noted that his 5 lead ECG was showing pt was currently in atrial flutter. MD was notified and stated that he would order a cardiology consult, no other orders recurved. Will continue to monitor.

## 2011-09-06 NOTE — Anesthesia Postprocedure Evaluation (Signed)
Anesthesia Post Note  Patient: Brady Bell  Procedure(s) Performed: Procedure(s) (LRB): BYPASS GRAFT FEMORAL-TIBIAL ARTERY (Left) FASCIOTOMY (Left)  Anesthesia type: general  Patient location: PACU  Post pain: Pain level controlled  Post assessment: Patient's Cardiovascular Status Stable  Last Vitals:  Filed Vitals:   09/06/11 1615  BP:   Pulse: 88  Temp:   Resp: 11    Post vital signs: Reviewed and stable  Level of consciousness: sedated  Complications: No apparent anesthesia complications

## 2011-09-06 NOTE — Progress Notes (Signed)
[facility] [place]  Apparently, pt transient in atrial fib/flutter.  Now in NSR.  His left foot is warm with palpable DP.  Left calf is soft and pt has intact motor in L foot.  I doubt he will develop compartment syndrome at this time.  Leonides Sake, MD [facility] [place] Office: [phone] Pager: [phone]  09/06/2011, 9:41 PM

## 2011-09-06 NOTE — Anesthesia Preprocedure Evaluation (Addendum)
Anesthesia Evaluation  Patient identified by MRN, date of birth, ID band Patient awake    Reviewed: Allergy & Precautions, H&P , NPO status , Patient's Chart, lab work & pertinent test results  Airway Mallampati: II TM Distance: >3 FB Neck ROM: Full    Dental  (+) Teeth Intact   Pulmonary Current Smoker,    Pulmonary exam normal       Cardiovascular negative cardio ROS      Neuro/Psych  Headaches, Seizures -, Well Controlled,     GI/Hepatic negative GI ROS, (+)     substance abuse  alcohol use and marijuana use,   Endo/Other  negative endocrine ROS  Renal/GU      Musculoskeletal   Abdominal Normal abdominal exam  (+)   Peds  Hematology negative hematology ROS (+)   Anesthesia Other Findings   Reproductive/Obstetrics                          Anesthesia Physical Anesthesia Plan  ASA: III and Emergent  Anesthesia Plan: General   Post-op Pain Management:    Induction: Intravenous  Airway Management Planned: Oral ETT  Additional Equipment:   Intra-op Plan:   Post-operative Plan: Extubation in OR  Informed Consent: I have reviewed the patients History and Physical, chart, labs and discussed the procedure including the risks, benefits and alternatives for the proposed anesthesia with the patient or authorized representative who has indicated his/her understanding and acceptance.   Dental advisory given  Plan Discussed with: CRNA, Anesthesiologist and Surgeon  Anesthesia Plan Comments:         Anesthesia Quick Evaluation

## 2011-09-07 LAB — BASIC METABOLIC PANEL
BUN: 7 mg/dL (ref 6–23)
CO2: 26 mEq/L (ref 19–32)
Calcium: 8.1 mg/dL — ABNORMAL LOW (ref 8.4–10.5)
Creatinine, Ser: 0.74 mg/dL (ref 0.50–1.35)
GFR calc non Af Amer: >90 mL/min (ref >90)
Glucose, Bld: 115 mg/dL — ABNORMAL HIGH (ref 70–99)
Sodium: 132 mEq/L — ABNORMAL LOW (ref 135–145)

## 2011-09-07 LAB — CBC
HCT: 39.7 % (ref 39.0–52.0)
Hemoglobin: 13.4 g/dL (ref 13.0–17.0)
MCH: 35.2 pg — ABNORMAL HIGH (ref 26.0–34.0)
MCHC: 33.8 g/dL (ref 30.0–36.0)
MCV: 104.2 fL — ABNORMAL HIGH (ref 78.0–100.0)

## 2011-09-07 MED ORDER — GABAPENTIN 300 MG PO CAPS
300.0000 mg | ORAL_CAPSULE | Freq: Three times a day (TID) | ORAL | Status: DC
  Administered 2011-09-07 – 2011-09-11 (×14): 300 mg via ORAL
  Filled 2011-09-07 (×15): qty 1

## 2011-09-07 NOTE — Evaluation (Signed)
Physical Therapy Evaluation Patient Details Name: Brady Bell MRN: [id number] DOB: 1955-07-08 Today's Date: 09/07/2011 Time: 4098-1191 PT Time Calculation (min): 13 min  PT Assessment / Plan / Recommendation Clinical Impression  Pt s/p femoral-tibial bypass graft. Pts functional mobility limited by pain. Pt will benefit from skilled PT in the acute care setting in order to maximize functional mobility and safety prior to d/c    PT Assessment  Patient needs continued PT services    Follow Up Recommendations  Supervision for mobility/OOB;Home health PT    Barriers to Discharge Decreased caregiver support;Inaccessible home environment flight of stairs to room    lEquipment Recommendations  Rolling walker with 5" wheels    Recommendations for Other Services     Frequency Min 3X/week    Precautions / Restrictions Precautions Precautions: Fall Restrictions Weight Bearing Restrictions: No         Mobility  Bed Mobility Bed Mobility: Supine to Sit;Sitting - Scoot to Edge of Bed Supine to Sit: 5: Supervision Sitting - Scoot to Edge of Bed: 5: Supervision Sit to Supine: 5: Supervision Details for Bed Mobility Assistance: VC for sequencing. No physical assist needed Transfers Transfers: Sit to Stand;Stand to Sit Sit to Stand: 4: Min assist;With upper extremity assist;From bed Stand to Sit: 4: Min assist;With upper extremity assist;To bed Details for Transfer Assistance: VC for hand placement. Pt with incerased pain in LLE during standing, therefore decreased weight bearing during transfer requiring increased assistance Ambulation/Gait Ambulation/Gait Assistance: 4: Min assist Ambulation Distance (Feet): 3 Feet (distance limited by pain) Assistive device: Rolling walker Ambulation/Gait Assistance Details: VC for sequencing. Pt unable to maintain upright posture due to pain, therefore returned to bed Gait Pattern: Trunk flexed;Decreased stance time - left;Decreased hip/knee  flexion - left;Step-to pattern;Decreased weight shift to left Gait velocity: decreased gait speed    Exercises     PT Diagnosis: Difficulty walking;Acute pain  PT Problem List: Decreased strength;Decreased activity tolerance;Decreased mobility;Decreased knowledge of use of DME;Decreased safety awareness;Decreased knowledge of precautions;Pain PT Treatment Interventions: DME instruction;Gait training;Stair training;Functional mobility training;Therapeutic activities;Therapeutic exercise;Patient/family education   PT Goals Acute Rehab PT Goals PT Goal Formulation: With patient Time For Goal Achievement: 09/14/11 Potential to Achieve Goals: Fair Pt will go Sit to Stand: with modified independence PT Goal: Sit to Stand - Progress: Goal set today Pt will go Stand to Sit: with modified independence PT Goal: Stand to Sit - Progress: Goal set today Pt will Transfer Bed to Chair/Chair to Bed: with modified independence PT Transfer Goal: Bed to Chair/Chair to Bed - Progress: Goal set today Pt will Ambulate: 51 - 150 feet;with modified independence;with least restrictive assistive device PT Goal: Ambulate - Progress: Goal set today Pt will Go Up / Down Stairs: with supervision;Flight;with rail(s) PT Goal: Up/Down Stairs - Progress: Goal set today  Visit Information  Last PT Received On: 09/14/11 Assistance Needed: +1    Subjective Data      Prior Functioning  Home Living Lives With: Friend(s) Available Help at Discharge: Friend(s);Available PRN/intermittently Type of Home: House Home Access: Other (comment);Stairs to enter (lives on 2nd floor of Friend's house) Secretary/administrator of Steps: 1 Entrance Stairs-Rails: None Home Layout: Two level Alternate Level Stairs-Number of Steps: 13 Alternate Level Stairs-Rails: Left Bathroom Shower/Tub: Forensic scientist: Standard Bathroom Accessibility: Yes How Accessible: Accessible via walker Home Adaptive Equipment:  Crutches Prior Function Level of Independence: Independent Able to Take Stairs?: Yes Driving: Yes Vocation: Unemployed Communication Communication: No difficulties Dominant Hand: Right  Cognition  Overall Cognitive Status: Appears within functional limits for tasks assessed/performed Arousal/Alertness: Awake/alert Orientation Level: Appears intact for tasks assessed Behavior During Session: [facility] for tasks performed    Extremity/Trunk Assessment Right Lower Extremity Assessment RLE ROM/Strength/Tone: Within functional levels RLE Sensation: WFL - Light Touch Left Lower Extremity Assessment LLE ROM/Strength/Tone: Unable to fully assess;Due to pain LLE Sensation: WFL - Light Touch   Balance    End of Session PT - End of Session Equipment Utilized During Treatment: Gait belt Activity Tolerance: Patient limited by pain Patient left: in bed;with call bell/phone within reach Nurse Communication: Mobility status   Milana Kidney 09/07/2011, 2:56 PM  09/07/2011 Milana Kidney DPT PAGER: [phone] OFFICE: [phone]

## 2011-09-07 NOTE — Progress Notes (Addendum)
[facility]  Daily Progress Note  Assessment/Planning: POD #1 s/p L fem-AT bypass   Some reperfusion injury vs. neuropathic pain without evidence of compartment syndrome  Start loading neurotin 300 mg PO tid  Awaiting postop ABI and PT/OT eval  Tsfr to flr  Subjective  - 1 Day Post-Op  C/o of medial leg pain  Objective Filed Vitals:   09/06/11 1920 09/06/11 2223 09/06/11 2300 09/07/11 0300  BP: 123/88 102/67 100/66 121/74  Pulse: 96 87 82 90  Temp:  97.7 F (36.5 C) 98 F (36.7 C)   TempSrc: Oral Oral Oral   Resp: 25 19 15 15   Height:      Weight:      SpO2: 100% 98% 100% 97%    Intake/Output Summary (Last 24 hours) at 09/07/11 0740 Last data filed at 09/07/11 0600  Gross per 24 hour  Intake   4120 ml  Output   2265 ml  Net   1855 ml    PULM  CTAB CV  RRR GI  soft, NTND VASC  L inc c/d/i, palpable DP, palpable graft pulse, warm foot, soft calf, PF/DF intact  Laboratory CBC    Component Value Date/Time   WBC 7.9 09/07/2011 0445   HGB 13.4 09/07/2011 0445   HCT 39.7 09/07/2011 0445   PLT 168 09/07/2011 0445    BMET    Component Value Date/Time   NA 132* 09/07/2011 0445   K 4.0 09/07/2011 0445   CL 101 09/07/2011 0445   CO2 26 09/07/2011 0445   GLUCOSE 115* 09/07/2011 0445   BUN 7 09/07/2011 0445   CREATININE 0.74 09/07/2011 0445   CALCIUM 8.1* 09/07/2011 0445   GFRNONAA >90 09/07/2011 0445   GFRAA >90 09/07/2011 0445    Leonides Sake, MD [facility] [place] Office: [phone] Pager: [phone]  09/07/2011, 7:40 AM

## 2011-09-07 NOTE — Progress Notes (Signed)
Pt transferred to 2028 per MD order. Report called to receiving nurse and all questions answered.

## 2011-09-08 ENCOUNTER — Encounter (HOSPITAL_COMMUNITY): Payer: Self-pay | Admitting: Vascular Surgery

## 2011-09-08 DIAGNOSIS — Z48812 Encounter for surgical aftercare following surgery on the circulatory system: Secondary | ICD-10-CM

## 2011-09-08 NOTE — Progress Notes (Addendum)
VASCULAR LAB PRELIMINARY  ARTERIAL  ABI completed:    RIGHT    LEFT    PRESSURE WAVEFORM  PRESSURE WAVEFORM  BRACHIAL 163 Triphasic BRACHIAL 149 Triphasic  DP 190 Monophasic DP 158 Monophasic         PT 198 Triphasic PT 158 Monophasic    RIGHT LEFT  ABI 1.21 0.97   ABIs have increased post operative on the left.  Brady Bell D, RVS 09/08/2011, 9:18 AM

## 2011-09-08 NOTE — Progress Notes (Signed)
Physical Therapy Treatment Patient Details Name: Brady Bell MRN: [id number] DOB: Nov 03, 1955 Today's Date: 09/08/2011 Time: 7829-5621 PT Time Calculation (min): 22 min  PT Assessment / Plan / Recommendation Comments on Treatment Session  Noted pt very limited in OT session due to pain. Initiated session by discussing feasibility of pt staying on the first level of his friend's home. Initially denied this as an option due to no bedroom on first level. Did state there is a recliner and a couch and a full bathroom, however reports he will go up the steps on his bottom before he will sleep downstairs. Reports he had a sprained ankle in January and was on crutches and went up down the stairs on his bottom. States he was able to hold onto the post at the top of the stairs and 1 crutch to pull himself up to standing. Additional time spent discussing exercises and positioning, (limited) elevation that pt can do to decrease edema and assist with pain control.     Follow Up Recommendations  Home health PT;Supervision for mobility/OOB    Barriers to Discharge        Equipment Recommendations  Rolling walker with 5" wheels;3 in 1 bedside comode    Recommendations for Other Services    Frequency Min 5X/week   Plan Discharge plan remains appropriate;Frequency needs to be updated    Precautions / Restrictions Precautions Precautions: Fall Restrictions Weight Bearing Restrictions: No   Pertinent Vitals/Pain 9/10 LLE with ambulation; called for pain meds, returned to bed and elevated on pillow    Mobility  Bed Mobility Bed Mobility: Sit to Supine Supine to Sit: 6: Modified independent (Device/Increase time);With rails;HOB elevated Sit to Supine: 6: Modified independent (Device/Increase time);HOB elevated Details for Bed Mobility Assistance: incr effort and time to lift LLE, however able to do so without assist Transfers Transfers: Sit to Stand;Stand to Sit Sit to Stand: With upper extremity  assist;With armrests;From chair/3-in-1;5: Supervision Stand to Sit: With upper extremity assist;To bed;5: Supervision Details for Transfer Assistance: Pt did not require cues for safe use of RW; does not weight bear on LLE Ambulation/Gait Ambulation/Gait Assistance: 4: Min guard;5: Supervision (progressed to Supervision) Ambulation Distance (Feet): 40 Feet Assistive device: Rolling walker Ambulation/Gait Assistance Details: verbal and visual cues for toe-touch weight bearing due to pain with attempts to place foot flat; pt in effort to get foot flat, flexes at hips with forward lean and hips rocking posterior with near loss of balance; does better with toe touch with knee flexed at this time; discussed goal to progress to foot flat Gait Pattern: Step-to pattern;Decreased step length - left;Decreased stance time - left;Left flexed knee in stance;Trunk flexed Stairs: No (discussed multiple options)    Exercises General Exercises - Lower Extremity Ankle Circles/Pumps: AROM;Left;10 reps;Supine Heel Slides: AROM;Left;5 reps;Supine;Standing Other Exercises Other Exercises: Standing erect and attempting to place Lt heel on floor/stretching calf    PT Diagnosis:    PT Problem List:   PT Treatment Interventions:     PT Goals Acute Rehab PT Goals PT Goal: Sit to Stand - Progress: Progressing toward goal PT Goal: Stand to Sit - Progress: Progressing toward goal PT Goal: Ambulate - Progress: Progressing toward goal  Visit Information  Last PT Received On: 09/08/11 Assistance Needed: +1    Subjective Data  Subjective: "The doctor said he wouldn't send me home until I can walk. There's no point in me going home and falling and breaking something." Patient Stated Goal: Wants to be able to  go to 2nd floor bedroom.   Cognition  Overall Cognitive Status: Appears within functional limits for tasks assessed/performed Arousal/Alertness: Awake/alert Orientation Level: Appears intact for tasks  assessed Behavior During Session: [facility] for tasks performed    Balance  Balance Balance Assessed: Yes Static Sitting Balance Static Sitting - Balance Support: No upper extremity supported;Feet supported Static Sitting - Level of Assistance: 6: Modified independent (Device/Increase time) Static Standing Balance Static Standing - Balance Support: Left upper extremity supported;During functional activity Static Standing - Level of Assistance: 4: Min assist  End of Session PT - End of Session Equipment Utilized During Treatment: Gait belt Activity Tolerance: Patient limited by pain Patient left: in bed;with call bell/phone within reach    Brady Bell 09/08/2011, 4:20 PM Pager [phone]

## 2011-09-08 NOTE — Progress Notes (Signed)
PT Cancellation Note  Treatment cancelled today due to pt had just finished with OT and reported too fatigued and painful at this time. Will attempt to revisit later today as schedule allows.  Gabriele Zwilling 09/08/2011, 2:31 PM Pager [phone]

## 2011-09-08 NOTE — Evaluation (Signed)
Occupational Therapy Evaluation Patient Details Name: Brady Bell MRN: [id number] DOB: 1955-10-08 Today's Date: 09/08/2011 Time: 4098-1191 OT Time Calculation (min): 40 min  OT Assessment / Plan / Recommendation Clinical Impression  Pt is a 56 yo male who presents with BYPASS GRAFT FEMORAL-TIBIAL ARTERY. Pt having extreme difficulty bearing weight through LLE and has 13 steps to negotiate at home. Skilled OT recommended to maximize I w/BADLs to supervision level in prep for safe d/c home with HHOT.    OT Assessment  Patient needs continued OT Services    Follow Up Recommendations  Home health OT    Barriers to Discharge      Equipment Recommendations  Rolling walker with 5" wheels;3 in 1 bedside comode    Recommendations for Other Services    Frequency  Min 2X/week    Precautions / Restrictions Precautions Precautions: Fall Restrictions Weight Bearing Restrictions: No   Pertinent Vitals/Pain Reported 8/10 pain in L calf. RN administered morphine.    ADL  Grooming: Performed;Wash/dry hands;Wash/dry face;Teeth care;Brushing hair;Min guard Where Assessed - Grooming: Supported standing Upper Body Bathing: Simulated;Set up Where Assessed - Upper Body Bathing: Unsupported sitting Lower Body Bathing: Simulated;Minimal assistance Where Assessed - Lower Body Bathing: Supported sit to stand Upper Body Dressing: Simulated;Set up Where Assessed - Upper Body Dressing: Unsupported sitting Lower Body Dressing: Simulated;Minimal assistance Where Assessed - Lower Body Dressing: Sopported sit to stand Toilet Transfer: Performed;Min guard Toilet Transfer Method: Sit to Barista: Raised toilet seat with arms (or 3-in-1 over toilet) Toileting - Clothing Manipulation and Hygiene: Performed;Min guard Where Assessed - Glass blower/designer Manipulation and Hygiene: Sit to stand from 3-in-1 or toilet Equipment Used: Rolling walker;Gait belt Transfers/Ambulation Related to  ADLs: Pt ambulated to the bathroom with minguard A and RW. Pt having extreme difficulty bearing weight through LLE 2* pain, relies heavily on arms.    OT Diagnosis: Generalized weakness  OT Problem List: Decreased activity tolerance;Decreased safety awareness;Decreased knowledge of use of DME or AE;Impaired balance (sitting and/or standing);Pain OT Treatment Interventions: Self-care/ADL training;Therapeutic activities;DME and/or AE instruction;Patient/family education;Balance training   OT Goals Acute Rehab OT Goals OT Goal Formulation: With patient Time For Goal Achievement: 09/22/11 Potential to Achieve Goals: Good ADL Goals Pt Will Perform Grooming: with supervision;Unsupported;Standing at sink ADL Goal: Grooming - Progress: Goal set today Pt Will Perform Lower Body Bathing: with supervision;Sit to stand from chair;Sit to stand from bed ADL Goal: Lower Body Bathing - Progress: Goal set today Pt Will Perform Lower Body Dressing: with supervision;Sit to stand from chair;Sit to stand from bed ADL Goal: Lower Body Dressing - Progress: Goal set today Pt Will Transfer to Toilet: with supervision;Comfort height toilet;3-in-1;Ambulation ADL Goal: Toilet Transfer - Progress: Goal set today Pt Will Perform Toileting - Clothing Manipulation: with supervision;Standing ADL Goal: Toileting - Clothing Manipulation - Progress: Goal set today Pt Will Perform Toileting - Hygiene: with supervision;Sit to stand from 3-in-1/toilet ADL Goal: Toileting - Hygiene - Progress: Goal set today Pt Will Perform Tub/Shower Transfer: with supervision;Tub transfer ADL Goal: Tub/Shower Transfer - Progress: Goal set today  Visit Information  Last OT Received On: 09/08/11 Assistance Needed: +1    Subjective Data  Subjective: I have a lot of steps to get into the house.   Prior Functioning  Home Living Lives With: Friend(s) Available Help at Discharge: Friend(s);Available PRN/intermittently Type of Home:  House Home Access: Stairs to enter (lives on the 2nd floor of a friends house.) Entrance Stairs-Rails: None Home Layout: Two level Alternate  Level Stairs-Number of Steps: 13 Alternate Level Stairs-Rails: Left Bathroom Shower/Tub: Engineer, manufacturing systems: Standard (vanity beside) Bathroom Accessibility: Yes How Accessible: Accessible via walker Home Adaptive Equipment: Crutches Prior Function Level of Independence: Independent Able to Take Stairs?: Yes Driving: Yes Vocation: Unemployed Communication Communication: No difficulties Dominant Hand: Right    Cognition  Overall Cognitive Status: Appears within functional limits for tasks assessed/performed Arousal/Alertness: Awake/alert Orientation Level: Appears intact for tasks assessed Behavior During Session: [facility] for tasks performed    Extremity/Trunk Assessment Right Upper Extremity Assessment RUE ROM/Strength/Tone: Within functional levels RUE Sensation: WFL - Light Touch RUE Coordination: WFL - gross/fine motor Left Upper Extremity Assessment LUE ROM/Strength/Tone: Within functional levels LUE Sensation: WFL - Light Touch LUE Coordination: WFL - gross/fine motor   Mobility Bed Mobility Supine to Sit: 6: Modified independent (Device/Increase time);With rails;HOB elevated Transfers Sit to Stand: 4: Min guard;With upper extremity assist;From bed;From chair/3-in-1 Stand to Sit: 4: Min guard;With upper extremity assist;To chair/3-in-1;With armrests Details for Transfer Assistance: MAX VCs for hand placement, technique and posture. Pt still having difficulty bearing weight through the LLE.   Exercise    Balance Balance Balance Assessed: Yes Static Sitting Balance Static Sitting - Balance Support: No upper extremity supported;Feet supported Static Sitting - Level of Assistance: 6: Modified independent (Device/Increase time) Static Standing Balance Static Standing - Balance Support: Left upper extremity supported;During  functional activity Static Standing - Level of Assistance: 4: Min assist  End of Session OT - End of Session Equipment Utilized During Treatment: Gait belt Activity Tolerance: Patient tolerated treatment well Patient left: in chair;with call bell/phone within reach   Ayona Yniguez A OTR/L [phone] 09/08/2011, 2:36 PM

## 2011-09-08 NOTE — Clinical Social Work Psychosocial (Signed)
     Clinical Social Work Department BRIEF PSYCHOSOCIAL ASSESSMENT 09/08/2011  Patient:  Brady Bell, Brady Bell     Account Number:  [id number]     Admit date:  09/04/2011  Clinical Social Worker:  Robin Searing  Date/Time:  09/08/2011 12:07 PM  Referred by:  Physician  Date Referred:  09/05/2011 Referred for  Psychosocial assessment   Other Referral:   "recent losses"   Interview type:  Patient Other interview type:    PSYCHOSOCIAL DATA Living Status:  OTHER Admitted from facility:   Level of care:   Primary support name:  Brady Bell Primary support relationship to patient:  FRIEND Degree of support available:   Patient resides with Brady Bell and his wife- sole support per patient    CURRENT CONCERNS Current Concerns  Other - See comment   Other Concerns:   Patient has no income- he has been living with his friend Brady Bell and his wife since his mother passed away- he had been his mother's caregiver- he is tearful in talking about the recent loss of his mother, "my guardian angel"- he also talked openly about the loss of a good friend in the '90's. "I've dealt with a lot of deaths and loss".    SOCIAL WORK ASSESSMENT / PLAN Support and encouragment offered to patient- will plan to link him with some community resources in the [place], [place] area.  Primary issues: housing, no income, no insurance, smoking, bereavment/grief   Assessment/plan status:  Information/Referral to [facility] Other assessment/ plan:   Refer to Smoking Cessation Nurse   Information/referral to community resources:   [employer], [employer], [facility] [place] [place] [employer] [facility]    PATIENTS/FAMILYS RESPONSE TO PLAN OF CARE: Patient very receptive to plans and for community links- He is appreciative of CSW visit and seems motivated to progress and move forward with no smoking, medical f/u, housing, etc.

## 2011-09-08 NOTE — Plan of Care (Signed)
Problem: Phase I Progression Outcomes Goal: Pain controlled with appropriate interventions Outcome: Progressing Continue to give pain meds and manage pain

## 2011-09-08 NOTE — Progress Notes (Addendum)
[facility]  Progress Note Bypass Surgery  Date of Surgery: 09/04/2011 - 09/06/2011  Procedure(s): BYPASS GRAFT FEMORAL-TIBIAL ARTERY FASCIOTOMY Surgeon: Surgeon(s): Fransisco Hertz, MD  2 Days Post-Op  History of Present Illness  Brady Bell is a 56 y.o. male who is S/P Procedure(s): BYPASS GRAFT FEMORAL-TIBIAL ARTERY FASCIOTOMY left.  The patient's pre-op symptoms of pain are Improved . Patients pain is well controlled.    VASC. LAB Studies:     pending   Imaging: No results found.  Significant Diagnostic Studies: CBC Lab Results  Component Value Date   WBC 7.9 09/07/2011   HGB 13.4 09/07/2011   HCT 39.7 09/07/2011   MCV 104.2* 09/07/2011   PLT 168 09/07/2011    BMET     Component Value Date/Time   NA 132* 09/07/2011 0445   K 4.0 09/07/2011 0445   CL 101 09/07/2011 0445   CO2 26 09/07/2011 0445   GLUCOSE 115* 09/07/2011 0445   BUN 7 09/07/2011 0445   CREATININE 0.74 09/07/2011 0445   CALCIUM 8.1* 09/07/2011 0445   GFRNONAA >90 09/07/2011 0445   GFRAA >90 09/07/2011 0445    COAG Lab Results  Component Value Date   INR 0.94 09/04/2011   No results found for this basename: PTT    Physical Examination  BP Readings from Last 3 Encounters:  09/08/11 145/78  09/08/11 145/78  09/08/11 145/78   Temp Readings from Last 3 Encounters:  09/08/11 98.6 F (37 C) Oral  09/08/11 98.6 F (37 C) Oral  09/08/11 98.6 F (37 C) Oral   SpO2 Readings from Last 3 Encounters:  09/08/11 98%  09/08/11 98%  09/08/11 98%   Pulse Readings from Last 3 Encounters:  09/08/11 86  09/08/11 86  09/08/11 86    Pt is A&O x 3 left lower extremity: Incision/s is/are clean,dry.intact, and  healing without hematoma, erythema or drainage Limb is warm; with good color  Left Dorsalis Pedis pulse is palpable Left Posterior tibial pulse is  palpable     Assessment/Plan: Pt. Doing well Post-op pain is controlled Wounds are clean, dry, intact or healing well PT/OT for  ambulation   Clinton Gallant [facility] [phone] 09/08/2011 8:02 AM  Addendum  I have independently interviewed and examined the patient, and I agree with the physician assistant's findings.  Incisions intact included anterior compartment incision.  Palpable pulse.  Patient needs smoking cessation.  Leonides Sake, MD [facility] [place] Office: [phone] Pager: [phone]  09/08/2011, 10:32 AM

## 2011-09-08 NOTE — Plan of Care (Signed)
Problem: Phase I Progression Outcomes Goal: Other Phase I Outcomes/Goals Outcome: Completed/Met Date Met:  09/08/11 Smoking cessation done, reinforcement given patient gave verbal demonstration   Problem: Phase II Progression Outcomes Goal: Barriers To Progression Addressed/Resolved Outcome: Progressing Physical therapy in progress

## 2011-09-08 NOTE — Care Management Note (Addendum)
    Page 1 of 2   09/11/2011     10:27:59 AM   CARE MANAGEMENT NOTE 09/11/2011  Patient:  Brady Bell, Brady Bell   Account Number:  [id number]  Date Initiated:  09/08/2011  Documentation initiated by:  AMERSON,JULIE  Subjective/Objective Assessment:   PT S/P LT FEM-TIB BYPASS GRAFT ON 09/05/11.  PTA, PT INDEPENDENT, LIVES WITH A FRIEND AND FRIEND'S WIFE.     Action/Plan:   MET WITH PT AND CSW TO DISCUSS DISCHARGE PLANS AND NEEDS. PT IS UNINSURED, AND WILL DC HOME POSS ON 09/09/11.   Anticipated DC Date:  09/11/2011   Anticipated DC Plan:  HOME W HOME HEALTH SERVICES  In-house referral  Clinical Social Worker      DC Planning Services  CM consult      [facility] Choice  HOME HEALTH  DURABLE MEDICAL EQUIPMENT   Choice offered to / List presented to:  C-1 Patient   DME arranged  WALKER - ROLLING  BEDSIDE COMMODE      DME agency  [facility]     HH arranged  HH-2 PT  HH-6 SOCIAL WORKER      HH agency  [facility]   Status of service:  Completed, signed off Medicare Important Message given?   (If response is "NO", the following Medicare IM given date fields will be blank) Date Medicare IM given:   Date Additional Medicare IM given:    Discharge Disposition:  HOME W HOME HEALTH SERVICES  Per UR Regulation:  Reviewed for med. necessity/level of care/duration of stay  If discussed at Long Length of Stay Meetings, dates discussed:    Comments:  09/11/11  0924  Erika Slaby SIMMONS RN, BSN [phone] DISCUSSED PURCHASING GENERIC NICOTINE PATCHES WITH PT; HE STATED HE WOULD GET THE PATCHES BC HE REALLY WANTS TO STOP SMOKING. PT IS ELIGIBLE FOR 3 DAYS SUPPLY OF MEDS FROM MAIN PHARMACY; MD-  PLEASE WRITE SEPARATE SCRIPTS FOR 3 DAYS SUPPLY OF MEDS; NCM PROVIDED MED ASSISTANCE FORMS FOR PT.  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  HE WAS VERY TEARY IN TALKING ABOUT HIS MOM, STATED THAT  SHE WAS "HIS ANGEL".  PT STATES HE FEELS HE HAD A "BREAKDOWN" AFTER THIS EVENT, AND LOST NEARLY EVERYTHING.  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  HE HAS APPLIED AND APPEALED FOR DISABILITY BENEFITS, BUT HAS BEEN TURNED DOWN. HE HAS BEEN CONTACTED THIS ADMISSION BY IN HOUSE FINACIAL COUNSELOR, WHO STATED SHE WOULD TRY TO ASSIST HIM WITH ANOTHER APPEAL.  PT STATES HE IS CONNECTED WITH "[facility] TO ACCESS"  PATIENT ASSISTANCE PROGRAM THROUGH A PHARMACEUTICAL CO. TO ASSIST HIM WITH PAYING FOR RX.  HE DESIRES TO STOP SMOKING, BUT IS NOT SURE HE CAN AFFORD THE PATCHES TO ASSIST HIM.  WILL CONSULT SMOKING CESSATION COUNSELOR TO VISIT/CHECK FOR AVAILABLE PATCH RESOURCES ON NEEDY MEDS.  WILL CONSULT [facility] FOR ASSISTANCE WITH HOME HEALTH PT AND DME -NEEDS RW AND BSC.  PT APPRECIATIVE OF ALL HELP GIVEN.

## 2011-09-08 NOTE — Progress Notes (Signed)
Patient had difficulty with weight bearing on L foot due to pain. Pain med given prior to transfer to wheelchair for ABI, will wait for physical therapy intervention.

## 2011-09-08 NOTE — Consult Note (Signed)
Pt smokes 1- 1 1/2 ppd and says he is interested in quitting but needs help. He is in action stage. Recommended 21 mg patch to start with. Discussed patch use instructions and how to taper. Pt voices understanding. Referred to 1-800 quit now for f/u and support. Discussed oral fixation substitutes, second hand smoke and in home smoking policy. Reviewed and gave pt Written education/contact information.

## 2011-09-09 NOTE — Progress Notes (Addendum)
[facility] Progress Note  09/09/2011 7:46 AM POD 3  Subjective:  Pt asking about chantix.  States his pain is much better.  States he is grateful to Dr. Imogene Burn.  Afebrile x 24 hrs  Otherwise VSS   98%RA Filed Vitals:   09/08/11 2049  BP: 129/87  Pulse: 79  Temp: 97.9 F (36.6 C)  Resp: 16    Physical Exam: Incisions:  C/d/i with staples in tact in below knee incision Extremities:  + palpable left DP;  Left foot warm and well perfused.  Sensation improved from when I first saw pt last week.   CBC    Component Value Date/Time   WBC 7.9 09/07/2011 0445   RBC 3.81* 09/07/2011 0445   HGB 13.4 09/07/2011 0445   HCT 39.7 09/07/2011 0445   PLT 168 09/07/2011 0445   MCV 104.2* 09/07/2011 0445   MCH 35.2* 09/07/2011 0445   MCHC 33.8 09/07/2011 0445   RDW 15.7* 09/07/2011 0445   LYMPHSABS 1.2 06/28/2007 1855   MONOABS 0.5 06/28/2007 1855   EOSABS 0.0 06/28/2007 1855   BASOSABS 0.0 06/28/2007 1855    BMET    Component Value Date/Time   NA 132* 09/07/2011 0445   K 4.0 09/07/2011 0445   CL 101 09/07/2011 0445   CO2 26 09/07/2011 0445   GLUCOSE 115* 09/07/2011 0445   BUN 7 09/07/2011 0445   CREATININE 0.74 09/07/2011 0445   CALCIUM 8.1* 09/07/2011 0445   GFRNONAA >90 09/07/2011 0445   GFRAA >90 09/07/2011 0445    INR    Component Value Date/Time   INR 0.94 09/04/2011 2209     Intake/Output Summary (Last 24 hours) at 09/09/11 0746 Last data filed at 09/08/11 1230  Gross per 24 hour  Intake   1120 ml  Output   2100 ml  Net   -980 ml     Assessment/Plan:  56 y.o. male is s/p Left common femoral artery to anterior tibial artery bypass with non-reversed ipslateral greater saphenous vein  POD 3  -pt needs at least one more day of PT and mobilization -otherwise, doing well. -very much wants to quit smoking.  Inquiring about Chantix-will d/w Dr. Imogene Burn -possibly home tomorrow.  Doreatha Massed, PA-C [facility] [phone] 09/09/2011 7:46 AM  Addendum  I have  independently interviewed and examined the patient, and I agree with the physician assistant's findings. All incision are intact.   Financial assistance for Chantix and other medications may be needed: will refer to Child psychotherapist.  Close attention of wounds, especial anterior tibial compartment exposure critical in this heavy smoker.  Leonides Sake, MD [facility] [place] Office: [phone] Pager: [phone]  09/09/2011, 9:23 AM

## 2011-09-09 NOTE — Progress Notes (Signed)
Physical Therapy Treatment Patient Details Name: Brady Bell MRN: [id number] DOB: [birth year] Today's Date: 09/09/2011 Time: 0973-5329 PT Time Calculation (min): 40 min  PT Assessment / Plan / Recommendation Comments on Treatment Session  Patient with BPG Left LE.  Will need to continue PT to stretch heel cord on left and progress mobility.  Patient has his own way of doing things and does not like to change them per pt.  Will continue PT as able.      Follow Up Recommendations  Home health PT;Supervision for mobility/OOB    Barriers to Discharge  None      Equipment Recommendations    Obtained   Recommendations for Other Services  None  Frequency Min 5X/week   Plan Discharge plan remains appropriate;Frequency remains appropriate    Precautions / Restrictions Precautions Precautions: Fall Restrictions Weight Bearing Restrictions: No   Pertinent Vitals/Pain VSS, 10/10 pain left LE    Mobility  Bed Mobility Bed Mobility: Supine to Sit;Sit to Supine Supine to Sit: 6: Modified independent (Device/Increase time);With rails;HOB flat Sitting - Scoot to Edge of Bed: 5: Supervision Sit to Supine: 6: Modified independent (Device/Increase time);HOB elevated Details for Bed Mobility Assistance: PAtient with good technique Transfers Transfers: Sit to Stand;Stand to Sit Sit to Stand: 5: Supervision;With upper extremity assist;With armrests;From bed Stand to Sit: 5: Supervision;With upper extremity assist;With armrests;To bed Details for Transfer Assistance: Patient did not require cues.  Puts very little weight on LLE Ambulation/Gait Ambulation/Gait Assistance: 4: Min guard Ambulation Distance (Feet): 20 Feet Assistive device: Rolling walker Ambulation/Gait Assistance Details: verbal and visual cues for TTWB due to pain.  PAtient makes effort to flatten foot but pain is too great.  Provided a sheet and showed him a stretching exercies.   Gait Pattern: Step-to pattern;Decreased step  length - left;Decreased stance time - left;Left flexed knee in stance;Trunk flexed Stairs: Yes Stairs Assistance: 4: Min assist Stairs Assistance Details (indicate cue type and reason): Needed cues and steadying assist for up and down steps, cues needed for technique and sequencing. Practiced with one rail and RW folded and with RW only.  Patient states he may just go up and down on his bottom because his LE hurts too bad.   Stair Management Technique: One rail Left;Forwards;With walker Number of Stairs: 7  Wheelchair Mobility Wheelchair Mobility: No    Exercises Other Exercises Other Exercises: streteching exercise with sheet to stretch calf   PT Goals Acute Rehab PT Goals PT Goal: Sit to Stand - Progress: Progressing toward goal PT Goal: Stand to Sit - Progress: Progressing toward goal PT Transfer Goal: Bed to Chair/Chair to Bed - Progress: Progressing toward goal PT Goal: Ambulate - Progress: Progressing toward goal PT Goal: Up/Down Stairs - Progress: Progressing toward goal  Visit Information  Last PT Received On: 09/09/11 Assistance Needed: +1    Subjective Data  Subjective: "I have trouble with my leg hurting."   Cognition  Overall Cognitive Status: Appears within functional limits for tasks assessed/performed Arousal/Alertness: Awake/alert Orientation Level: Appears intact for tasks assessed Behavior During Session: Brady Bell for tasks performed    Balance  Static Standing Balance Static Standing - Balance Support: Bilateral upper extremity supported;During functional activity Static Standing - Level of Assistance: 4: Min assist  End of Session PT - End of Session Equipment Utilized During Treatment: Gait belt Activity Tolerance: Patient tolerated treatment well;Patient limited by pain Patient left: in chair;with call bell/phone within reach Nurse Communication: Mobility status    Bell,Brady Kulpa 09/09/2011, 2:29 PM  [facility] Acute  Rehabilitation [phone] (pager)

## 2011-09-09 NOTE — Progress Notes (Signed)
Met with patient earlier today to follow up and provide further support and resources. He is experiencing increased pain due to walking in hall and doing steps-  States it is the most pain he has had thus far with his leg- RN advised. 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 He denies feeling depressed at this time- knows signs/symptoms to watch for and is open to community resources that will provide him bereavement counseling. He also shared with me that he is looking to make some changes- [place] and has optimistic goals in mind for his life. "I know she is still with me". Support provided and I will f/u with him again tomorrow to further assess and assist. Reece Levy, MSW, [employer] [phone]

## 2011-09-09 NOTE — Progress Notes (Signed)
OT Cancellation Note  Treatment cancelled today due to patient's refusal to participate 2* LLE pain. Will check back another time.  Barbera Perritt A OTR/L [phone] 09/09/2011, 2:36 PM

## 2011-09-09 NOTE — Plan of Care (Signed)
Problem: Phase I Progression Outcomes Goal: Initial discharge plan identified Outcome: Completed/Met Date Met:  09/09/11 Home with friends with home health  Problem: Phase II Progression Outcomes Goal: Barriers To Progression Addressed/Resolved Outcome: Completed/Met Date Met:  09/09/11 ambulation

## 2011-09-10 MED ORDER — CEPHALEXIN 500 MG PO CAPS
500.0000 mg | ORAL_CAPSULE | Freq: Four times a day (QID) | ORAL | Status: DC
  Administered 2011-09-10 – 2011-09-11 (×5): 500 mg via ORAL
  Filled 2011-09-10 (×7): qty 1

## 2011-09-10 MED ORDER — OXYCODONE HCL 10 MG PO TB12
10.0000 mg | ORAL_TABLET | Freq: Two times a day (BID) | ORAL | Status: DC
  Administered 2011-09-10 – 2011-09-11 (×3): 10 mg via ORAL
  Filled 2011-09-10 (×3): qty 1

## 2011-09-10 MED ORDER — ENSURE COMPLETE PO LIQD
237.0000 mL | Freq: Every day | ORAL | Status: DC
  Administered 2011-09-10: 237 mL via ORAL

## 2011-09-10 MED ORDER — OXYCODONE-ACETAMINOPHEN 5-325 MG PO TABS
1.0000 | ORAL_TABLET | Freq: Four times a day (QID) | ORAL | Status: DC | PRN
  Administered 2011-09-10 – 2011-09-11 (×5): 2 via ORAL
  Filled 2011-09-10 (×5): qty 2

## 2011-09-10 MED ORDER — OXYCODONE-ACETAMINOPHEN 5-325 MG PO TABS
1.0000 | ORAL_TABLET | ORAL | Status: DC | PRN
  Administered 2011-09-10 (×2): 2 via ORAL
  Filled 2011-09-10 (×2): qty 2

## 2011-09-10 NOTE — Progress Notes (Addendum)
INITIAL ADULT NUTRITION ASSESSMENT Date: 09/10/2011   Time: 2:43 PM  Reason for Assessment: Health Hx  ASSESSMENT: Male 56 y.o.  Dx: left foot ischemia  Hx: Past Medical History  Diagnosis Date  . Migraines   . Hypoglycemia   . Seizures   . Chronic knee pain   . Chronic ankle pain     Related Meds:     . alprazolam  2 mg Oral QHS  . docusate sodium  100 mg Oral Daily  . enoxaparin  40 mg Subcutaneous Q24H  . gabapentin  300 mg Oral TID  . nicotine  21 mg Transdermal Q24H  . oxyCODONE  10 mg Oral Q12H  . pantoprazole  40 mg Oral Q1200  . topiramate  200 mg Oral Daily    Ht: 5\' 10"  (177.8 cm)  Wt: 119 lb 11.4 oz (54.3 kg)  Ideal Wt: 75.4 kg % Ideal Wt: 72%  Usual Wt: --- % Usual Wt: ---  Body mass index is 17.18 kg/(m^2).  Food/Nutrition Related Hx: no triggers per admission nutrition screen  Labs:  CMP     Component Value Date/Time   NA 132* 09/07/2011 0445   K 4.0 09/07/2011 0445   CL 101 09/07/2011 0445   CO2 26 09/07/2011 0445   GLUCOSE 115* 09/07/2011 0445   BUN 7 09/07/2011 0445   CREATININE 0.74 09/07/2011 0445   CALCIUM 8.1* 09/07/2011 0445   PROT 7.2 09/04/2011 2209   ALBUMIN 3.2* 09/04/2011 2209   AST 122* 09/04/2011 2209   ALT 40 09/04/2011 2209   ALKPHOS 108 09/04/2011 2209   BILITOT 0.3 09/04/2011 2209   GFRNONAA >90 09/07/2011 0445   GFRAA >90 09/07/2011 0445     Intake/Output Summary (Last 24 hours) at 09/10/11 1446 Last data filed at 09/10/11 0800  Gross per 24 hour  Intake    300 ml  Output   1151 ml  Net   -851 ml    Diet Order: Cardiac  Supplements/Tube Feeding: N/A  IVF:    sodium chloride Last Rate: 100 mL/hr at 09/08/11 0800    Estimated Nutritional Needs:   Kcal: 1900-2100 Protein: 90-100 gm Fluid: 1.9-2.1 L  Patient with worsening leg pain; s/p aortogram with bilateral lower extremity runoff 6/7; patient states his usually only consumes 2 meals per day; PO intake 100% per flowsheet records; noted smokes 1 pack of cigarettes per day;  meets criteria for underweight state; has drank Ensure supplements in the past; would like during his hospitalization -- RD to order.  NUTRITION DIAGNOSIS: -Underweight (NI-3.1).  Status: Ongoing  RELATED TO: inadequate energy intake  AS EVIDENCE BY: BMI of 17.2 kg/m2  MONITORING/EVALUATION(Goals): Goal: Oral intake to meet >90% of estimated nutrition needs Monitor: PO intake, weight, labs, I/O's  EDUCATION NEEDS: -No education needs identified at this time  INTERVENTION:  Add strawberry Ensure Complete daily per pt request (350 kcals, 13 gm protein per 8 fl oz bottle)  RD to follow for nutrition care plan  Dietitian #: [phone]  DOCUMENTATION CODES Per approved criteria  -Underweight    Alger Memos 09/10/2011, 2:43 PM

## 2011-09-10 NOTE — Progress Notes (Signed)
Occupational Therapy Treatment Patient Details Name: Brady Bell MRN: [id number] DOB: 02/09/1956 Today's Date: 09/10/2011 Time: 1308-6578 OT Time Calculation (min): 24 min  OT Assessment / Plan / Recommendation Comments on Treatment Session Pt continuing to make slow, steady gains despite his 10/10 pain and inability to bear full weight through LLE.    Follow Up Recommendations  Home health OT    Barriers to Discharge       Equipment Recommendations  Other (comment) (already received)    Recommendations for Other Services    Frequency Min 2X/week   Plan Discharge plan remains appropriate    Precautions / Restrictions Precautions Precautions: Fall Restrictions Weight Bearing Restrictions: No   Pertinent Vitals/Pain 10/10 pain LLE. Pt repositioned and premedicated.    ADL  Grooming: Performed;Wash/dry face;Brushing hair Where Assessed - Grooming: Unsupported sitting Upper Body Bathing: Performed;Set up Where Assessed - Upper Body Bathing: Unsupported sitting Lower Body Bathing: Performed;Min guard Where Assessed - Lower Body Bathing: Supported sit to stand Upper Body Dressing: Performed;Set up Where Assessed - Upper Body Dressing: Unsupported sitting Lower Body Dressing: Performed;Min guard Where Assessed - Lower Body Dressing: Sopported sit to stand Equipment Used: Rolling walker ADL Comments: Pt declined OOB to chair or to perform ADLs at the sink 2* pain. Pt requires steadying assist when pulling pants up and pushing them down. Pt has difficulty completing dynamic activities with BUEs due to inability to put full weight through LLE.    OT Diagnosis:    OT Problem List:   OT Treatment Interventions:     OT Goals ADL Goals ADL Goal: Grooming - Progress: Progressing toward goals ADL Goal: Lower Body Bathing - Progress: Progressing toward goals ADL Goal: Lower Body Dressing - Progress: Progressing toward goals  Visit Information  Last OT Received On:  09/10/11 Assistance Needed: +1    Subjective Data  Subjective: I still cant put weight through that foot.   Prior Functioning       Cognition  Overall Cognitive Status: Appears within functional limits for tasks assessed/performed Arousal/Alertness: Awake/alert Orientation Level: Appears intact for tasks assessed Behavior During Session: [facility] for tasks performed    Mobility Bed Mobility Supine to Sit: 6: Modified independent (Device/Increase time) Sit to Supine: 6: Modified independent (Device/Increase time) Transfers Sit to Stand: 5: Supervision;With upper extremity assist;From bed Stand to Sit: 5: Supervision;With upper extremity assist;To bed Details for Transfer Assistance: Pt did have one LOB when letting go of RW that he was unable to self correct. Therapist had to assist with steadying pt.  Still putting little to no weight through LLE.   Exercises    Balance Static Sitting Balance Static Sitting - Balance Support: No upper extremity supported;Feet supported Static Sitting - Level of Assistance: 6: Modified independent (Device/Increase time) Static Standing Balance Static Standing - Balance Support: Left upper extremity supported;During functional activity Static Standing - Level of Assistance: 4: Min assist  End of Session OT - End of Session Activity Tolerance: Patient limited by pain Patient left: in bed;with call bell/phone within reach   Kamylah Manzo A OTR/L [phone] 09/10/2011, 8:55 AM

## 2011-09-10 NOTE — Progress Notes (Addendum)
Called to evaluate pt this afternoon for increased redness around staples at distal incision.  There is some increased redness at this incision, which is most likely due to reaction to the staples.  However, this could be the start of an infectious process, therefore, we will start po Keflex.  Also, called to assess his left groin.  He does have a hematoma there.  He is to notify the nurse if this gets any larger.    Discussed with Dr. Imogene Burn and he is not alarmed by the hematoma.  We will keep a close eye on this.  Also discussed with Dr. Imogene Burn about the pt's lovenox and the pt is not ambulating enough to discontinue.  If this worsens, we will d/c lovenox at time.  We will check a CBC in the am.  Doreatha Massed, PA-C 09/10/2011 3:31 PM

## 2011-09-10 NOTE — Progress Notes (Signed)
RNCM states that there is not any assistance available to provide free smoking cessation meds however he can purchase the nicotine patches for little money. I have a call into the Smoking Cessation Nurse to see if she is aware of any services that may be of benefit to him. Reece Levy, MSW, [facility] [phone]

## 2011-09-10 NOTE — Progress Notes (Signed)
Physical Therapy Treatment Patient Details Name: Brady Bell MRN: [id number] DOB: 1955/04/15 Today's Date: 09/10/2011 Time: 2130-8657 PT Time Calculation (min): 28 min  PT Assessment / Plan / Recommendation Comments on Treatment Session  Patient with BPG Left LE.  Will need to continue PT to stretch heel cord on left and progress mobility.  Pt will need to "step up" on the heel cord stretches.  Continue    Follow Up Recommendations  Home health PT;Supervision for mobility/OOB    Barriers to Discharge        Equipment Recommendations  Other (comment) (already received)    Recommendations for Other Services    Frequency Min 5X/week   Plan Discharge plan remains appropriate;Frequency remains appropriate    Precautions / Restrictions Precautions Precautions: Fall Restrictions Weight Bearing Restrictions: No   Pertinent Vitals/Pain     Mobility  Bed Mobility Bed Mobility: Supine to Sit;Sit to Supine Supine to Sit: 7: Independent Sitting - Scoot to Edge of Bed: 7: Independent Sit to Supine: 7: Independent Transfers Transfers: Sit to Stand;Stand to Sit Sit to Stand: 5: Supervision;With upper extremity assist;From bed Stand to Sit: 5: Supervision;With upper extremity assist;To bed Details for Transfer Assistance: uses hands approp.; no assist needed Ambulation/Gait Ambulation/Gait Assistance: 4: Min guard Ambulation Distance (Feet): 120 Feet Assistive device: Rolling walker Ambulation/Gait Assistance Details: step to gait, PWB on L LE with no weight down on the heel Gait Pattern: Step-to pattern;Decreased step length - left;Decreased stance time - left;Left flexed knee in stance;Trunk flexed Stairs: No    Exercises Other Exercises Other Exercises: streteching exercise with sheet to stretch calf (written parameters given to him)   PT Diagnosis:    PT Problem List:   PT Treatment Interventions:     PT Goals Acute Rehab PT Goals Time For Goal Achievement: 09/14/11 PT  Goal: Sit to Stand - Progress: Progressing toward goal PT Goal: Stand to Sit - Progress: Progressing toward goal PT Transfer Goal: Bed to Chair/Chair to Bed - Progress: Progressing toward goal PT Goal: Ambulate - Progress: Progressing toward goal  Visit Information  Last PT Received On: 09/10/11 Assistance Needed: +1    Subjective Data  Subjective: I've been trying to get it to  the floor Patient Stated Goal: Wants to be able to go to 2nd floor bedroom.   Cognition  Overall Cognitive Status: Appears within functional limits for tasks assessed/performed Arousal/Alertness: Awake/alert Orientation Level: Appears intact for tasks assessed Behavior During Session: [facility] for tasks performed    Balance  Balance Balance Assessed: No Static Sitting Balance Static Sitting - Balance Support: No upper extremity supported;Feet supported Static Sitting - Level of Assistance: 7: Independent Static Standing Balance Static Standing - Balance Support: Left upper extremity supported;During functional activity Static Standing - Level of Assistance: 5: Stand by assistance  End of Session PT - End of Session Activity Tolerance: Patient tolerated treatment well Patient left: in bed;with call bell/phone within reach Nurse Communication: Mobility status    Kamarie Palma, Jove Beyl 09/10/2011, 11:12 AM  09/10/2011  Sheridan Bing, PT [phone] (pager)

## 2011-09-10 NOTE — Progress Notes (Signed)
Patient in good spirits today- "I walked in the halls again today and had less pain than yesterday!"  He is hopeful to d/c home by end of week- he is still concerned about his SS Disability appeal- I have contacted the Financial Counselor who will f/u with him to further assist and process. I also have spoken with the Congreagational Nurse in [facility] whom may have some additional community resources, including a free clinic, to link him with- she will f/u with him as well. Patient continues to feel better, be optimistic and be setting goals for post discharge. Support and encouragement provided- he is very appreciaitive and complimentary of all the staff here- "I have been treated like a king".  Reece Levy, MSW, [facility] [phone]

## 2011-09-10 NOTE — Progress Notes (Addendum)
[facility] Progress Note  09/10/2011 7:36 AM POD 4  Subjective:  Feels like he needs another day to be more steady before going home.  States he still can't plant his foot flat.  Afebrile  Otherwise VSS Filed Vitals:   09/10/11 0402  BP: 100/64  Pulse: 77  Temp: 98 F (36.7 C)  Resp: 17    Physical Exam: Incisions:  C/d/i.  All incisions are c/d/i. Extremities:  LLE is warm and there is a palpable DP  On the left  CBC    Component Value Date/Time   WBC 7.9 09/07/2011 0445   RBC 3.81* 09/07/2011 0445   HGB 13.4 09/07/2011 0445   HCT 39.7 09/07/2011 0445   PLT 168 09/07/2011 0445   MCV 104.2* 09/07/2011 0445   MCH 35.2* 09/07/2011 0445   MCHC 33.8 09/07/2011 0445   RDW 15.7* 09/07/2011 0445   LYMPHSABS 1.2 06/28/2007 1855   MONOABS 0.5 06/28/2007 1855   EOSABS 0.0 06/28/2007 1855   BASOSABS 0.0 06/28/2007 1855    BMET    Component Value Date/Time   NA 132* 09/07/2011 0445   K 4.0 09/07/2011 0445   CL 101 09/07/2011 0445   CO2 26 09/07/2011 0445   GLUCOSE 115* 09/07/2011 0445   BUN 7 09/07/2011 0445   CREATININE 0.74 09/07/2011 0445   CALCIUM 8.1* 09/07/2011 0445   GFRNONAA >90 09/07/2011 0445   GFRAA >90 09/07/2011 0445    INR    Component Value Date/Time   INR 0.94 09/04/2011 2209     Intake/Output Summary (Last 24 hours) at 09/10/11 0736 Last data filed at 09/09/11 2052  Gross per 24 hour  Intake    540 ml  Output    800 ml  Net   -260 ml     Assessment/Plan:  56 y.o. male is s/p Left common femoral artery to anterior tibial artery bypass with non-reversed ipslateral greater saphenous vein  POD 4  -bypass patent-pre op pain improved -social work continues to work with pt on insurance needs.  Will ask them to seek financial assistance with possibly getting the pt on Chantix as it is crucial for smoking cessation in this pt. -continue PT/OT and mobilization  Doreatha Massed, PA-C [facility] [phone] 09/10/2011 7:36 AM  Addendum  I have  independently interviewed and examined the patient, and I agree with the physician assistant's findings.  Some degree of left foot drop (existed preop) is present, limiting ambulation.  Unfortunately, I suspect this patient's finances are limiting his rehab options.  He will need long-term rehab to regain as much function as possible from his preoperative ischemia neuropathy.  I suspect he will be at risk for RSD also given the extent of ischemic neuropathy.  I am adding Oxycontin 10 mg PO q12h to try to get the patient off any prn IV narcotics.  I expect him to have some residual degree of pain due to his ischemic neuropathy.  As the Neurotin loads, hopefully, this narcotic need will decrease.  Leonides Sake, MD [facility] [place] Office: [phone] Pager: [phone]  09/10/2011, 8:37 AM

## 2011-09-11 ENCOUNTER — Telehealth: Payer: Self-pay | Admitting: Vascular Surgery

## 2011-09-11 LAB — CBC
MCH: 34.5 pg — ABNORMAL HIGH (ref 26.0–34.0)
MCHC: 33.7 g/dL (ref 30.0–36.0)
MCV: 102.3 fL — ABNORMAL HIGH (ref 78.0–100.0)
Platelets: 272 10*3/uL (ref 150–400)
RDW: 15.4 % (ref 11.5–15.5)

## 2011-09-11 MED ORDER — GABAPENTIN 300 MG PO CAPS: 300.0000 mg | ORAL_CAPSULE | Freq: Three times a day (TID) | ORAL | Status: DC

## 2011-09-11 MED ORDER — CEPHALEXIN 500 MG PO CAPS: 500.0000 mg | ORAL_CAPSULE | Freq: Four times a day (QID) | ORAL | Status: AC

## 2011-09-11 MED ORDER — OXYCODONE HCL 10 MG PO TB12: 10.0000 mg | ORAL_TABLET | Freq: Two times a day (BID) | ORAL | Status: DC

## 2011-09-11 MED ORDER — NICOTINE 21 MG/24HR TD PT24: 1.0000 | MEDICATED_PATCH | TRANSDERMAL | Status: DC

## 2011-09-11 MED ORDER — CEPHALEXIN 500 MG PO CAPS: 500.0000 mg | ORAL_CAPSULE | Freq: Four times a day (QID) | ORAL | Status: DC

## 2011-09-11 MED ORDER — NICOTINE 21 MG/24HR TD PT24: 1.0000 | MEDICATED_PATCH | TRANSDERMAL | Status: AC

## 2011-09-11 NOTE — Progress Notes (Signed)
Physical Therapy Treatment Patient Details Name: Brady [name] MRN: [id number] DOB: 29-Apr-1955 Today's Date: 09/11/2011 Time: 4098-1191 PT Time Calculation (min): 26 min  PT Assessment / Plan / Recommendation Comments on Treatment Session  Patient with BPG Left LE.  Will need to continue PT to stretch heel cord on left and progress mobility.   Pt has completed education, but could use some HHPT if possible.    Follow Up Recommendations  Home health PT;Supervision - Intermittent    Barriers to Discharge        Equipment Recommendations  Rolling walker with 5" wheels    Recommendations for Other Services    Frequency     Plan Discharge plan remains appropriate;Frequency remains appropriate    Precautions / Restrictions Precautions Precautions: Fall Restrictions Weight Bearing Restrictions: No   Pertinent Vitals/Pain     Mobility  Bed Mobility Bed Mobility: Supine to Sit Supine to Sit: 7: Independent Transfers Sit to Stand: 6: Modified independent (Device/Increase time) Stand to Sit: 6: Modified independent (Device/Increase time) Details for Transfer Assistance: reinforced safer technique Ambulation/Gait Ambulation/Gait Assistance: 6: Modified independent (Device/Increase time) Ambulation Distance (Feet): 20 Feet (x2) Assistive device: Rolling walker Ambulation/Gait Assistance Details:  tdwb to pwb on L leg. step to gait; cues for incr. safety Gait Pattern: Step-to pattern;Decreased step length - left;Decreased stance time - left;Left flexed knee in stance;Trunk flexed Gait velocity: decreased gait speed Stairs: Yes Stairs Assistance: 4: Min guard Stairs Assistance Details (indicate cue type and reason): frequent cues to get sequencing correct, but executed better than past stair attempts Stair Management Technique: One rail Left;Step to pattern;Backwards;Forwards;With walker (used 2 different methods) Number of Stairs: 2  (x2) Wheelchair Mobility Wheelchair Mobility:  No    Exercises Other Exercises Other Exercises: stretching exercise with sheet to stretch calf   PT Diagnosis:    PT Problem List:   PT Treatment Interventions:     PT Goals Acute Rehab PT Goals Time For Goal Achievement: 09/14/11 Potential to Achieve Goals: Fair PT Goal: Sit to Stand - Progress: Met PT Goal: Stand to Sit - Progress: Met PT Transfer Goal: Bed to Chair/Chair to Bed - Progress: Met PT Goal: Ambulate - Progress: Progressing toward goal PT Goal: Up/Down Stairs - Progress: Partly met  Visit Information  Last PT Received On: 09/11/11 Assistance Needed: +1    Subjective Data  Subjective: These steps are going to kill me Patient Stated Goal: Wants to be able to go to 2nd floor bedroom.   Cognition  Overall Cognitive Status: Appears within functional limits for tasks assessed/performed Arousal/Alertness: Awake/alert Orientation Level: Appears intact for tasks assessed Behavior During Session: [facility] for tasks performed    Balance  Balance Balance Assessed:  (generally stable, but impulsive)  End of Session PT - End of Session Activity Tolerance: Patient tolerated treatment well Patient left: in bed;with call bell/phone within reach Nurse Communication: Mobility status    Beckham Buxbaum, Jyren Cerasoli 09/11/2011, 1:23 PM  09/11/2011  Haviland Bing, PT [phone] (pager)

## 2011-09-11 NOTE — Progress Notes (Addendum)
[facility] Progress Note  09/11/2011 7:19 AM POD 5  Subjective:  No complaints  Afebrile x 24 hours Filed Vitals:   09/11/11 0544  BP: 112/75  Pulse: 68  Temp: 97.6 F (36.4 C)  Resp: 18    Physical Exam: Incisions:  No change from yesterday Extremities:  + palpable left DP pulse  CBC    Component Value Date/Time   WBC 6.1 09/11/2011 0500   RBC 3.86* 09/11/2011 0500   HGB 13.3 09/11/2011 0500   HCT 39.5 09/11/2011 0500   PLT 272 09/11/2011 0500   MCV 102.3* 09/11/2011 0500   MCH 34.5* 09/11/2011 0500   MCHC 33.7 09/11/2011 0500   RDW 15.4 09/11/2011 0500   LYMPHSABS 1.2 06/28/2007 1855   MONOABS 0.5 06/28/2007 1855   EOSABS 0.0 06/28/2007 1855   BASOSABS 0.0 06/28/2007 1855    BMET    Component Value Date/Time   NA 132* 09/07/2011 0445   K 4.0 09/07/2011 0445   CL 101 09/07/2011 0445   CO2 26 09/07/2011 0445   GLUCOSE 115* 09/07/2011 0445   BUN 7 09/07/2011 0445   CREATININE 0.74 09/07/2011 0445   CALCIUM 8.1* 09/07/2011 0445   GFRNONAA >90 09/07/2011 0445   GFRAA >90 09/07/2011 0445    INR    Component Value Date/Time   INR 0.94 09/04/2011 2209     Intake/Output Summary (Last 24 hours) at 09/11/11 0719 Last data filed at 09/11/11 0300  Gross per 24 hour  Intake    600 ml  Output   2051 ml  Net  -1451 ml     Assessment/Plan:  56 y.o. male is s/p Left common femoral artery to anterior tibial artery bypass with non-reversed ipslateral greater saphenous vein  POD 5  -doing well-d/c home -will d/c on a week of Keflex -there is no assistance for smoking cessation medication-will d/c with nicotine patches.  Doreatha Massed, PA-C [facility] [phone] 09/11/2011 7:19 AM  Addendum  I have independently interviewed and examined the patient, and I agree with the physician assistant's findings.  Possible cellulitis in vein harvest track along with some reaction to staples.  Anterior compartment incision is intact without any signs of  infections.  Strong pulses.  Home with service.  Follow up in office in one week for staples.  Leonides Sake, MD [facility] [place] Office: [phone] Pager: [phone]  09/11/2011, 7:35 AM

## 2011-09-11 NOTE — Discharge Summary (Signed)
[facility] Discharge Summary  Brady [name] 07/20/1955 56 y.o. 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 male who presented to OSH ED with worsening pain in his left leg. It started ~ 7-10 days ago and has progressively gotten worse. He states that he does have cramping in his calf, but is not specific to walking or rest. The only thing that makes it better is pain medication. He states that he fell on his left knee about 1.5 years ago and he broke his right ankle back in Jan of this year. He does smoke 1 ppd for 35-40 years. Denies HTN and states that his BP is low and denies hypercholesterolemia. States he has had some swelling of his left leg, but this is improved in the mornings when he awakes.  Hospital Course:  The patient was admitted to the hospital and taken to the PVL on 09/05/11 and underwent Aortogram with bilateral lower extremity runoff.  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  The pt tolerated the procedure well and was transported to the PACU in good condition. In the PACU, he had some atrial flutter that converted back to NSR.  On POD 1, he was started on neurontin and  he was also transferred to the telemetry floor.  The pt's pre op symptoms were improved.  His ABIs post operatively were as follows:  RIGHT    LEFT     PRESSURE  WAVEFORM   PRESSURE  WAVEFORM   BRACHIAL  163  Triphasic  BRACHIAL  149  Triphasic   DP  190  Monophasic  DP  158  Monophasic          PT  198  Triphasic  PT  158  Monophasic     RIGHT  LEFT   ABI  1.21  0.97    The pt was counseled on smoking cessation.  There is no financial assistance for Chantix, therefore the pt will be sent home with nicotine patches.  Unfortunately, I suspect this patient's finances are limiting his rehab options. He will need long-term rehab to regain as much function as possible from his preoperative ischemia neuropathy. I suspect he will be at risk for RSD also given the extent of ischemic neuropathy. I am adding Oxycontin 10 mg PO q12h to try to get the patient off any prn IV narcotics. I expect him to have some residual degree of pain due to his ischemic neuropathy. As the Neurotin loads, hopefully, this narcotic need will decrease.  He did have some irritation around the staples in the distal below knee incision and was started on  prophylactic ABx. He will continue this for 7 days.  The remainder of the hospital course consisted  of increasing mobilization and increasing intake of solids without difficulty.  CBC    Component Value Date/Time   WBC 6.1 09/11/2011 0500   RBC 3.86* 09/11/2011 0500   HGB 13.3 09/11/2011 0500   HCT 39.5 09/11/2011 0500   PLT 272 09/11/2011 0500   MCV 102.3* 09/11/2011 0500   MCH 34.5* 09/11/2011 0500   MCHC 33.7 09/11/2011 0500   RDW 15.4 09/11/2011 0500   LYMPHSABS 1.2 06/28/2007 1855   MONOABS 0.5 06/28/2007 1855   EOSABS 0.0 06/28/2007 1855   BASOSABS 0.0 06/28/2007 1855    BMET    Component Value Date/Time   NA 132* 09/07/2011 0445   K 4.0 09/07/2011 0445   CL 101 09/07/2011 0445   CO2 26 09/07/2011 0445   GLUCOSE 115* 09/07/2011 0445   BUN 7 09/07/2011 0445   CREATININE  0.74 09/07/2011 0445   CALCIUM 8.1* 09/07/2011 0445   GFRNONAA >90 09/07/2011 0445   GFRAA >90 09/07/2011 0445     Discharge Instructions:   The patient is discharged to home with extensive instructions on wound care and progressive ambulation.  They are instructed not to drive or perform any heavy lifting until returning to see the physician in his office.  Discharge Orders    Future Orders Please Complete By Expires   Resume previous diet      Driving Restrictions      Comments:   No driving for 2 weeks   Lifting restrictions      Comments:   No lifting for 6 weeks   Call MD for:  temperature >100.5      Call MD for:  redness, tenderness, or signs of infection (pain, swelling, bleeding, redness, odor or green/yellow discharge around incision site)      Call MD for:  severe or increased pain, loss or decreased feeling  in affected limb(s)      Discharge wound care:      Comments:   Shower daily with soap and water starting 09/12/11      Discharge Diagnosis:  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 CLOSED FRACTURE OF UPPER END OF TIBIA  . TEAR MEDIAL MENISCUS   Past Medical History  Diagnosis Date  . Migraines   . Hypoglycemia   . Seizures   . Chronic knee pain   . Chronic ankle pain       Brady [name], Brady [name]  Home Medication Instructions [id number]   Printed on:09/11/11 0737  Medication Information                    SUMAtriptan (IMITREX) 6 MG/0.5ML SOLN injection Inject 6 mg into the skin every 2 (two) hours as needed. For migraines.           alprazolam (XANAX) 2 MG tablet Take 2 mg by mouth at bedtime.            topiramate (TOPAMAX) 100 MG tablet Take 200 mg by mouth daily.           cephALEXin (KEFLEX) 500 MG capsule Take 1 capsule (500 mg total) by mouth 4 (four) times daily. X 7 days          nicotine (NICODERM CQ - DOSED IN MG/24 HOURS) 21 mg/24hr patch Place  1 patch onto the skin daily.           oxyCODONE (OXYCONTIN) 10 MG 12 hr tablet Take 1  tablet (10 mg total) by mouth every 12 (twelve) hours. #30 NR          gabapentin (NEURONTIN) 300 MG capsule Take 1 capsule (300 mg total) by mouth 3 (three) times daily.             Disposition: home with PT/OT  Patient's condition: is Limited  Follow up: 1. Dr. Imogene Burn in 2 weeks   Doreatha Massed, PA-C [facility] [phone] 09/11/2011  7:30 AM  Addendum  I have independently interviewed and examined the patient, and I agree with the physician assistant's findings.  This patient was seen in the hospital with preoperative neurologic symptoms in his left leg.  His diagnostic angiogram demonstrated femoropopliteal disease.  He required a left common femoral artery to anterior tibial artery bypass with non-reversed ipsilateral greater saphenous vein to revascularize his left leg.  His post-operative course was notable for progressive improvement in ambulation.  PT/OT felt he need home PT.  He has some residual weakness in his plantarflexion which is improved from his pre-operative status.  He also has symptoms consistent with ischemic neuropathy and was prescribed Neurotin.  Additionally his vein harvest demonstrates some findings consistent with limited cellulitis so he will be sent home with Keflex.  He will follow up in the office next week for staple removal.  Additionally follow up at 2 weeks for re-evaluation of his surgical incisions.  Leonides Sake, MD [facility] [place] Office: [phone] Pager: [phone]  09/11/2011, 8:36 AM

## 2011-09-11 NOTE — Telephone Encounter (Addendum)
Message copied by Shari Prows on Thu Sep 11, 2011  9:48 AM ------      Message from: Lorin Mercy K      Created: Thu Sep 11, 2011  7:52 AM      Regarding: schedule                   ----- Message -----         From: Dara Lords, PA         Sent: 09/11/2011   7:29 AM           To: Sharee Pimple, CMA            [name],[name], 56 y.o., [birth year]            S/p left fem to ant tib bypass       F/u with Imogene Burn in 2 weeks.            Thanks,      Brady [name]  I scheduled an appointment for the above patient on Friday 09/26/11 at 8:45am. He is aware of the appointment and I mailed letter also. Jacklyn Shell

## 2011-09-25 ENCOUNTER — Encounter: Payer: Self-pay | Admitting: Vascular Surgery

## 2011-09-26 ENCOUNTER — Ambulatory Visit (INDEPENDENT_AMBULATORY_CARE_PROVIDER_SITE_OTHER): Payer: Self-pay | Admitting: Vascular Surgery

## 2011-09-26 ENCOUNTER — Encounter: Payer: Self-pay | Admitting: Vascular Surgery

## 2011-09-26 VITALS — BP 126/90 | HR 76 | Temp 98.0°F | Ht 70.0 in | Wt 125.0 lb

## 2011-09-26 DIAGNOSIS — Z48812 Encounter for surgical aftercare following surgery on the circulatory system: Secondary | ICD-10-CM

## 2011-09-26 DIAGNOSIS — I70219 Atherosclerosis of native arteries of extremities with intermittent claudication, unspecified extremity: Secondary | ICD-10-CM | POA: Insufficient documentation

## 2011-09-26 NOTE — Progress Notes (Signed)
[facility] [place]  Postoperative Visit  History of Present Illness  Brady Bell is a 56 y.o. year old male who presents for postoperative follow-up for: L CFA to AT bypass w/ NR ips GSV (Date: 09/06/11).  The patient's wounds are healing.  The patient notes resolution of lower extremity symptoms.  The patient is able to complete their activities of daily living.  The patient's current symptoms are: occasional L knee patient from a fall prior to procedure.  Physical Examination  Filed Vitals:   09/26/11 0855  BP: 126/90  Pulse: 76  Temp: 98 F (36.7 C)   GEN: odor of cigarettes LLE: Incisions are healing, calf incision is erythematous around staples, lateral ant. Compartment exposure incision c/d/i, palpable graft pulse and DP  Medical Decision Making  Brady Bell is a 56 y.o. year old male who presents s/p L CFA to AT bypass w/ NR ips GSV (Date: 09/06/11).  The patient's bypass incisions are healing appropriately with resolution of pre-operative symptoms. I discussed in depth with the patient the nature of atherosclerosis, and emphasized the importance of maximal medical management including strict control of blood pressure, blood glucose, and lipid levels, obtaining regular exercise, and cessation of smoking.  The patient is aware that without maximal medical management the underlying atherosclerotic disease process will progress, limiting the benefit of any interventions. I reiterated to this patient that failure to stop smoking may result in failure of the bypass and wound complications.  He states he understands the risks and is working on his own to stop smoking.  His cessation options are somewhat limited by his finances. The patient's surveillance will included ABI and bypass duplex studies which will be completed in: 3 months, at which time the patient will be re-evaluated.   I emphasized the importance of routine surveillance of the patient's bypass, as  the vascular surgery literature emphasize the improved patency possible with assisted primary patency procedures versus secondary patency procedures. The patient agrees to participate in their maximal medical care and routine surveillance.  Thank you for allowing [place] to participate in this patient's care.  Leonides Sake, MD [facility] [place] Office: [phone] Pager: [phone]

## 2011-10-01 ENCOUNTER — Telehealth: Payer: Self-pay

## 2011-10-01 MED ORDER — TRAMADOL HCL 50 MG PO TABS: ORAL_TABLET | ORAL | Status: DC

## 2011-10-01 NOTE — Telephone Encounter (Addendum)
Phone call from pt.  Stated he continues to have pain in left leg incision and left knee, especially with walking.  Reports that he fell prior to his surgery, and injured his left knee.  States he saw Dr. Imogene Burn last Fri. and forgot to mention it.  Rates pain level at 8/10 at times.  Reports has been taking "Ibuprofen 400 mg about every 4 hrs and it doesn't really help".  Discussed w/ Kallie Edward, RN, NP.  Rec'd v.o. for  Tramadol 50 mg., 1-2 tabs q 6 hrs prn/ # 20/ no refills.  Advised pt. of Tramadol order being faxed to pharmacy, and of need to notify PCP if con't pain in left knee.  Verb. Understanding.

## 2011-12-25 ENCOUNTER — Encounter: Payer: Self-pay | Admitting: Vascular Surgery

## 2011-12-26 ENCOUNTER — Encounter (INDEPENDENT_AMBULATORY_CARE_PROVIDER_SITE_OTHER): Payer: Self-pay | Admitting: *Deleted

## 2011-12-26 ENCOUNTER — Encounter: Payer: Self-pay | Admitting: Vascular Surgery

## 2011-12-26 ENCOUNTER — Ambulatory Visit (INDEPENDENT_AMBULATORY_CARE_PROVIDER_SITE_OTHER): Payer: Self-pay | Admitting: Vascular Surgery

## 2011-12-26 VITALS — BP 138/91 | HR 69 | Resp 16 | Ht 70.0 in | Wt 135.0 lb

## 2011-12-26 DIAGNOSIS — Z48812 Encounter for surgical aftercare following surgery on the circulatory system: Secondary | ICD-10-CM

## 2011-12-26 DIAGNOSIS — I70219 Atherosclerosis of native arteries of extremities with intermittent claudication, unspecified extremity: Secondary | ICD-10-CM

## 2011-12-26 DIAGNOSIS — M79669 Pain in unspecified lower leg: Secondary | ICD-10-CM | POA: Insufficient documentation

## 2011-12-26 DIAGNOSIS — M79609 Pain in unspecified limb: Secondary | ICD-10-CM

## 2011-12-26 NOTE — Progress Notes (Signed)
[facility] [place]  Established Previous Bypass  History of Present Illness  Brady Bell is a 56 y.o. (July 08, 1955) male who presents with chief complaint: routine follow-up.  Previous operation(s) include: L CFA to AT bypass with NR GSV  (Date: 09/06/11).  The patient's symptoms have resolved.  The patient's symptoms are: L knee pain after a fall. The patient's treatment regimen currently included: maximal medical management.  The patient continues to smoke.  Past Medical History, Past Surgical History, Social History, Family History, Medications, Allergies, and Review of Systems are unchanged from previous evaluation on 09/26/11.  Physical Examination  Filed Vitals:   12/26/11 1530  BP: 138/91  Pulse: 69  Resp: 16  Height: 5\' 10"  (1.778 m)  Weight: 135 lb (61.236 kg)  SpO2: 98%   Body mass index is 19.37 kg/(m^2).  General: A&O x 3, WDWN  Pulmonary: Sym exp, good air movt, CTAB, no rales, rhonchi, & wheezing  Cardiac: RRR, Nl S1, S2, no Murmurs, rubs or gallops  Vascular: Vessel Right Left  Radial Palpable Palpable  Brachial Palpable Palpable  Carotid Palpable, without bruit Palpable, without bruit  Aorta Non-palpable N/A  Femoral Palpable Palpable  Popliteal Non-palpable Non-palpable  PT Palpable Palpable  DP Palpable Palpable   Musculoskeletal: M/S 5/5 throughout , mild pain in L knee with ROM testing, Extremities without ischemic changes , incision healed except for scab in vein harvest incision on calf, easily palpable bypass graft pulse laterally in thigh and at the anterior compartment exposure incision  Neurologic: Pain and light touch intact in extremities , Motor exam as listed above  Non-Invasive Vascular Imaging ABI (Date: 12/26/11)  RLE: 1.15, PT: triphasic, AT: biphasic  LLE: 1.02, PT: barely biphasic, AT: biphasic  LLE graft duplex (Date: 12/26/11)  Inflow: 82 c/s  Prox anast: 95 c/s  Conduit: 50-69 c/s  Distal anast: 79  c/s  Outflow: 54 c/s  Triphasic throughout  Medical Decision Making  Brady Bell is a 56 y.o. 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  I don't believe he will have any meaningful long-term patency without giving up his smoking.  I discussed in depth with the patient the nature of atherosclerosis, and emphasized the importance of maximal medical management including strict control of blood pressure, blood glucose, and lipid levels, obtaining regular exercise, and cessation of smoking.  The patient is aware that without maximal medical management the underlying atherosclerotic disease process will progress, limiting the benefit of any interventions.  I discussed in depth with the patient the need for long term surveillance to improve the primary assisted patency of his bypass.  The patient agrees to cooperate with such.  The patient is scheduled for repeat BLE ABI and L bypass duplex in 3 months.  If the results are stable, we will likely space them out to 6 months.  In regards to the L knee, I recommended prn use of NSAIDs with food intake.  Thank you for allowing [place] to participate in this patient's care.  Leonides Sake, MD [facility] [place] Office: [phone] Pager: [phone]  12/26/2011, 4:15 PM

## 2011-12-29 NOTE — Addendum Note (Signed)
Addended by: Sharee Pimple on: 12/29/2011 08:23 AM   Modules accepted: Orders

## 2012-03-25 ENCOUNTER — Encounter: Payer: Self-pay | Admitting: Vascular Surgery

## 2012-03-26 ENCOUNTER — Ambulatory Visit (INDEPENDENT_AMBULATORY_CARE_PROVIDER_SITE_OTHER): Payer: Self-pay | Admitting: Vascular Surgery

## 2012-03-26 ENCOUNTER — Encounter: Payer: Self-pay | Admitting: Vascular Surgery

## 2012-03-26 ENCOUNTER — Encounter (INDEPENDENT_AMBULATORY_CARE_PROVIDER_SITE_OTHER): Payer: Self-pay | Admitting: *Deleted

## 2012-03-26 VITALS — BP 145/103 | HR 74 | Ht 70.0 in | Wt 126.0 lb

## 2012-03-26 DIAGNOSIS — I70219 Atherosclerosis of native arteries of extremities with intermittent claudication, unspecified extremity: Secondary | ICD-10-CM

## 2012-03-26 DIAGNOSIS — M79609 Pain in unspecified limb: Secondary | ICD-10-CM | POA: Insufficient documentation

## 2012-03-26 DIAGNOSIS — I739 Peripheral vascular disease, unspecified: Secondary | ICD-10-CM

## 2012-03-26 DIAGNOSIS — Z48812 Encounter for surgical aftercare following surgery on the circulatory system: Secondary | ICD-10-CM

## 2012-03-26 NOTE — Progress Notes (Signed)
[facility] [place]   Established Previous Bypass  History of Present Illness  Brady Bell is a 56 y.o. (1956/01/23) male who presents with chief complaint: routine follow-up. Previous operation(s) include: L CFA to AT bypass with NR GSV (Date: 09/06/11). The patient's symptoms have resolved. The patient's symptoms are: L ant. knee pain present since a prior fall.  The patient's treatment regimen currently included: maximal medical management and NSAID. The patient continues to smoke.   Past Medical History, Past Surgical History, Social History, Family History, Medications, Allergies, and Review of Systems are unchanged from previous evaluation on 12/26/11.   Physical Examination  Filed Vitals:   03/26/12 1515  BP: 145/103  Pulse: 74  Height: 5\' 10"  (1.778 m)  Weight: 126 lb (57.153 kg)  SpO2: 100%   General: A&O x 3, WDWN , odor of smoke  Pulmonary: Sym exp, good air movt, CTAB, no rales, rhonchi, & wheezing   Cardiac: RRR, Nl S1, S2, no Murmurs, rubs or gallops   Vascular:  Vessel  Right  Left   Radial  Palpable  Palpable   Brachial  Palpable  Palpable   Carotid  Palpable, without bruit  Palpable, without bruit   Aorta  Non-palpable  N/A   Femoral  Palpable  Palpable   Popliteal  Non-palpable  Non-palpable   PT  Palpable  Palpable   DP  Palpable  Palpable    Musculoskeletal: M/S 5/5 throughout , Extremities without ischemic changes , incision healed except for scab in vein harvest incision on calf, easily palpable bypass graft pulse laterally in thigh and at the anterior compartment exposure incision , the pulse is particularly prominent distally  Neurologic: Pain and light touch intact in extremities , Motor exam as listed above   Non-Invasive Vascular Imaging  ABI (Date: 03/26/12)  RLE: 1.32, PT: triphasic, AT: biphasic  LLE: 1.16, PT: biphasic, AT: triiphasic  LLE graft duplex (Date: 03/26/12)  Inflow: 113 c/s  Prox anast: 138 c/s  Conduit:  71-131 c/s  Distal anast: 212 c/s  Outflow: 149 c/s  Triphasic throughout  Medical Decision Making  Brady Bell is a 56 y.o. 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 I don't believe he will have any meaningful long-term patency without giving up his smoking.  Unfortunately, the physical exam is consistent with the graft duplex.  Due to the concern with possible distal target stenosis, I would recommend LLE angiogram with possible intervention in primary assisted patency fashion. I discussed with the patient the nature of angiographic procedures, especially the limited patencies of any endovascular intervention.  The patient is aware of that the risks of an angiographic procedure include but are not limited to: bleeding, infection, access site complications, renal failure, embolization, rupture of vessel, dissection, possible need for emergent surgical intervention, possible need for surgical procedures to treat the patient's pathology, and stroke and death.  The patient is aware of the risks and agrees to proceed on 13 JAN 14. I discussed in depth with the patient the nature of atherosclerosis, and emphasized the importance of maximal medical management including strict control of blood pressure, blood glucose, and lipid levels, obtaining regular exercise, and cessation of smoking. The patient is aware that without maximal medical management the underlying atherosclerotic disease process will progress, limiting the benefit of any interventions.  Thank you for allowing [place] to participate in this patient's care.  Leonides Sake, MD [facility] [place] Office: [phone] Pager: [phone]  03/26/2012, 3:55 PM

## 2012-04-06 ENCOUNTER — Other Ambulatory Visit: Payer: Self-pay

## 2012-04-08 ENCOUNTER — Encounter (HOSPITAL_COMMUNITY): Payer: Self-pay | Admitting: Pharmacy Technician

## 2012-04-15 ENCOUNTER — Encounter (HOSPITAL_COMMUNITY): Payer: Self-pay | Admitting: General Practice

## 2012-04-15 ENCOUNTER — Encounter (HOSPITAL_COMMUNITY)
Admission: RE | Disposition: A | Discharge status: Home / Self Care | Payer: Self-pay | Source: Ambulatory Visit | Attending: Vascular Surgery

## 2012-04-15 ENCOUNTER — Ambulatory Visit (HOSPITAL_COMMUNITY)
Admission: RE | Admit: 2012-04-15 | Discharge: 2012-04-16 | Disposition: A | Discharge status: Home / Self Care | Payer: Medicaid Other | Source: Ambulatory Visit | Attending: Vascular Surgery | Admitting: Vascular Surgery

## 2012-04-15 ENCOUNTER — Telehealth: Payer: Self-pay | Admitting: Vascular Surgery

## 2012-04-15 DIAGNOSIS — G8929 Other chronic pain: Secondary | ICD-10-CM | POA: Insufficient documentation

## 2012-04-15 DIAGNOSIS — F17200 Nicotine dependence, unspecified, uncomplicated: Secondary | ICD-10-CM | POA: Insufficient documentation

## 2012-04-15 DIAGNOSIS — M25569 Pain in unspecified knee: Secondary | ICD-10-CM | POA: Insufficient documentation

## 2012-04-15 DIAGNOSIS — I739 Peripheral vascular disease, unspecified: Secondary | ICD-10-CM

## 2012-04-15 DIAGNOSIS — I70309 Unspecified atherosclerosis of unspecified type of bypass graft(s) of the extremities, unspecified extremity: Secondary | ICD-10-CM | POA: Insufficient documentation

## 2012-04-15 DIAGNOSIS — I70219 Atherosclerosis of native arteries of extremities with intermittent claudication, unspecified extremity: Secondary | ICD-10-CM

## 2012-04-15 DIAGNOSIS — Z79899 Other long term (current) drug therapy: Secondary | ICD-10-CM | POA: Insufficient documentation

## 2012-04-15 DIAGNOSIS — M25579 Pain in unspecified ankle and joints of unspecified foot: Secondary | ICD-10-CM | POA: Insufficient documentation

## 2012-04-15 HISTORY — DX: Peripheral vascular disease, unspecified: I73.9

## 2012-04-15 HISTORY — PX: LOWER EXTREMITY ANGIOGRAM: SHX5508

## 2012-04-15 LAB — POCT I-STAT, CHEM 8
Calcium, Ion: 1.18 mmol/L (ref 1.12–1.23)
Chloride: 108 mEq/L (ref 96–112)
HCT: 54 % — ABNORMAL HIGH (ref 39.0–52.0)
Potassium: 3.7 mEq/L (ref 3.5–5.1)
Sodium: 138 mEq/L (ref 135–145)

## 2012-04-15 LAB — CREATININE, SERUM: GFR calc Af Amer: >90 mL/min (ref >90)

## 2012-04-15 LAB — CBC
HCT: 44.4 % (ref 39.0–52.0)
Hemoglobin: 15.5 g/dL (ref 13.0–17.0)
MCV: 95.3 fL (ref 78.0–100.0)
RBC: 4.66 MIL/uL (ref 4.22–5.81)
WBC: 6.6 10*3/uL (ref 4.0–10.5)

## 2012-04-15 SURGERY — ANGIOGRAM, LOWER EXTREMITY
Anesthesia: LOCAL | Laterality: Left

## 2012-04-15 MED ORDER — ACETAMINOPHEN 650 MG RE SUPP: 325.0000 mg | RECTAL | Status: DC | PRN

## 2012-04-15 MED ORDER — OXYCODONE HCL 10 MG PO TB12: 10.0000 mg | ORAL_TABLET | Freq: Two times a day (BID) | ORAL | Status: DC

## 2012-04-15 MED ORDER — GABAPENTIN 300 MG PO CAPS
300.0000 mg | ORAL_CAPSULE | Freq: Three times a day (TID) | ORAL | Status: DC
  Administered 2012-04-15 – 2012-04-16 (×3): 300 mg via ORAL
  Filled 2012-04-15 (×5): qty 1

## 2012-04-15 MED ORDER — SODIUM CHLORIDE 0.9 % IJ SOLN
3.0000 mL | Freq: Two times a day (BID) | INTRAMUSCULAR | Status: DC
  Administered 2012-04-15: 3 mL via INTRAVENOUS

## 2012-04-15 MED ORDER — LIDOCAINE HCL (PF) 1 % IJ SOLN
INTRAMUSCULAR | Status: AC
  Filled 2012-04-15: qty 30

## 2012-04-15 MED ORDER — ALPRAZOLAM 0.5 MG PO TABS
2.0000 mg | ORAL_TABLET | Freq: Every day | ORAL | Status: DC
  Administered 2012-04-15: 2 mg via ORAL
  Filled 2012-04-15: qty 4

## 2012-04-15 MED ORDER — ENOXAPARIN SODIUM 40 MG/0.4ML ~~LOC~~ SOLN: 40.0000 mg | SUBCUTANEOUS | Status: DC

## 2012-04-15 MED ORDER — OXYCODONE HCL 5 MG PO TABS
5.0000 mg | ORAL_TABLET | ORAL | Status: DC | PRN
  Administered 2012-04-15: 5 mg via ORAL
  Filled 2012-04-15: qty 1

## 2012-04-15 MED ORDER — SODIUM CHLORIDE 0.9 % IV SOLN
INTRAVENOUS | Status: DC
  Administered 2012-04-15: 1000 mL via INTRAVENOUS

## 2012-04-15 MED ORDER — OXYCODONE HCL ER 10 MG PO T12A
10.0000 mg | EXTENDED_RELEASE_TABLET | Freq: Two times a day (BID) | ORAL | Status: DC
  Administered 2012-04-15 (×2): 10 mg via ORAL
  Filled 2012-04-15 (×2): qty 1

## 2012-04-15 MED ORDER — FENTANYL CITRATE 0.05 MG/ML IJ SOLN
INTRAMUSCULAR | Status: AC
  Filled 2012-04-15: qty 2

## 2012-04-15 MED ORDER — NICOTINE 21 MG/24HR TD PT24
21.0000 mg | MEDICATED_PATCH | Freq: Every day | TRANSDERMAL | Status: DC
  Administered 2012-04-15 – 2012-04-16 (×2): 21 mg via TRANSDERMAL
  Filled 2012-04-15 (×2): qty 1

## 2012-04-15 MED ORDER — SUMATRIPTAN SUCCINATE 6 MG/0.5ML ~~LOC~~ SOLN: 6.0000 mg | SUBCUTANEOUS | Status: DC | PRN

## 2012-04-15 MED ORDER — ACETAMINOPHEN 325 MG PO TABS: 325.0000 mg | ORAL_TABLET | ORAL | Status: DC | PRN

## 2012-04-15 MED ORDER — SODIUM CHLORIDE 0.9 % IJ SOLN: 3.0000 mL | INTRAMUSCULAR | Status: DC | PRN

## 2012-04-15 MED ORDER — LABETALOL HCL 5 MG/ML IV SOLN: 10.0000 mg | INTRAVENOUS | Status: DC | PRN

## 2012-04-15 MED ORDER — METOPROLOL TARTRATE 1 MG/ML IV SOLN
2.0000 mg | INTRAVENOUS | Status: DC | PRN
Start: 2012-04-15 — End: 2012-04-16

## 2012-04-15 MED ORDER — SUMATRIPTAN SUCCINATE 6 MG/0.5ML ~~LOC~~ SOLN
6.0000 mg | SUBCUTANEOUS | Status: DC | PRN
Start: 2012-04-15 — End: 2012-04-16
  Filled 2012-04-15: qty 0.5

## 2012-04-15 MED ORDER — SODIUM CHLORIDE 0.9 % IV SOLN
1.0000 mL/kg/h | INTRAVENOUS | Status: AC
  Administered 2012-04-15: 1 mL/kg/h via INTRAVENOUS

## 2012-04-15 MED ORDER — ONDANSETRON HCL 4 MG/2ML IJ SOLN: 4.0000 mg | Freq: Four times a day (QID) | INTRAMUSCULAR | Status: DC | PRN

## 2012-04-15 MED ORDER — TOPIRAMATE 100 MG PO TABS
200.0000 mg | ORAL_TABLET | Freq: Every day | ORAL | Status: DC
  Administered 2012-04-15 – 2012-04-16 (×2): 200 mg via ORAL
  Filled 2012-04-15 (×2): qty 2

## 2012-04-15 MED ORDER — HYDRALAZINE HCL 20 MG/ML IJ SOLN: 10.0000 mg | INTRAMUSCULAR | Status: DC | PRN

## 2012-04-15 MED ORDER — SODIUM CHLORIDE 0.9 % IV SOLN: 250.0000 mL | INTRAVENOUS | Status: DC | PRN

## 2012-04-15 MED ORDER — MIDAZOLAM HCL 2 MG/2ML IJ SOLN
INTRAMUSCULAR | Status: AC
  Filled 2012-04-15: qty 2

## 2012-04-15 MED ORDER — BUPROPION HCL ER (SMOKING DET) 150 MG PO TB12
150.0000 mg | ORAL_TABLET | Freq: Two times a day (BID) | ORAL | Status: DC
  Administered 2012-04-15 – 2012-04-16 (×3): 150 mg via ORAL
  Filled 2012-04-15 (×4): qty 1

## 2012-04-15 MED ORDER — ENOXAPARIN SODIUM 40 MG/0.4ML ~~LOC~~ SOLN
40.0000 mg | SUBCUTANEOUS | Status: DC
  Filled 2012-04-15 (×2): qty 0.4

## 2012-04-15 NOTE — Op Note (Signed)
OPERATIVE NOTE   PROCEDURE: 1. Right common femoral artery cannulation under ultrasound guidance 2. Third order arterial selection 3. Left leg runoff  PRE-OPERATIVE DIAGNOSIS: possible right external iliac artery and bypass conduit stenosis  POST-OPERATIVE DIAGNOSIS: same as above   SURGEON: Leonides Sake, MD  ANESTHESIA: IV sedation  ESTIMATED BLOOD LOSS: 30 cc  FINDING(S): 1. Widely patent left external iliac artery  2. <50% stenosis in left common femoral artery  3. Widely patent left profunda femoral artery  4. Widely patent vein bypass conduit from left common femoral artery to anterior tibial artery  5. Proximal anterior tibial artery stenosis <30% 6. Intact dorsalis pedis runoff to foot with collateral filling of posterior tibial artery also  SPECIMEN(S):  none  INDICATIONS:   Brady Bell is a 57 y.o. male who presents with elevated velocities in left leg on surveillance duplex after a left common femoral artery to anterior tibial artery bypass.  I recommended we complete a left leg angiogram with possible intervention.  I discussed with the patient the nature of angiographic procedures, especially the limited patencies of any endovascular intervention.  The patient is aware of that the risks of an angiographic procedure include but are not limited to: bleeding, infection, access site complications, renal failure, embolization, rupture of vessel, dissection, possible need for emergent surgical intervention, possible need for surgical procedures to treat the patient's pathology, and stroke and death.  The patient is aware of the risks and agrees to proceed.  DESCRIPTION: After full informed consent was obtained from the patient, the patient was brought back to the angiography suite.  The patient was placed supine upon the angiography table and connected to monitoring equipment.  The patient was then given conscious sedation, the amounts of which are documented in the patient's  chart.  The patient was prepped and drape in the standard fashion for an angiographic procedure.  At this point, attention was turned to the right groin.  Under ultrasound guidance, the right common femoral artery will be cannulated with a 18 gauge needle.  The [facility] wire was passed up into the aorta.  The needle was exchanged for a 5-Fr sheath, which was advanced over the wire into the common femoral artery.  The dilator was then removed.  The [facility] wire was replaced in the catheter, and using the [place] and Omniflush catheter, the left common iliac artery was selected.  The wire would not advance pass common iliac artery, so I exchanged it for a glidewire, which I was able to advance into the common femoral artery.  The catheter would not advance, so I exchanged it for an end-hole catheter which I lodged into the external iliac artery.  I connected this catheter to the power injector circuit after deairring and declotting the circuit.  A left leg runoff was completed.  I then selected out the bypass graft and advanced the catheter into the graft and did a magnified injection over the proximal anterior tibial artery.  The magnified view demonstrates <30% stenosis in the proximal anterior tibial artery.  Based on the images, no intervention is indicated/  There is no evidence of anastomotic stenosis at either end of the vein conduit despite the arterial duplex findings.  At this point, I pulled out the catheter.  The sheath was aspirated.  No clots were present and the sheath was reloaded with heparinized saline.    COMPLICATIONS: none  CONDITION: stable  Leonides Sake, MD [facility] [place] Office: [phone] Pager: [phone]  04/15/2012, 10:40 AM

## 2012-04-15 NOTE — Telephone Encounter (Addendum)
Message copied by Rosalyn Charters on Thu Apr 15, 2012  1:27 PM ------      Message from: Melene Plan      Created: Thu Apr 15, 2012 11:34 AM                   ----- Message -----         From: Fransisco Hertz, MD         Sent: 04/15/2012  10:51 AM           To: Reuel Derby, Melene Plan, RN            TEVIS DUNAVAN      [id number]      1955-07-19            PROCEDURE:      1. Right common femoral artery cannulation under ultrasound guidance      2. Third order arterial selection      3. Left leg runoff            Follow-up: 4 weeks  notified patient of fu appt. on 05-07-12 12:30 and also mailed appt. letter

## 2012-04-15 NOTE — Progress Notes (Signed)
Pt co migraine headache onset with sharp pain in back of head at neck radiating around to front of head with nausea.  Patient took imitrex 6 mgm im.  Dr Imogene Burn advised

## 2012-04-15 NOTE — H&P (Addendum)
[facility] [place]  Brief History and Physical  History of Present Illness  Brady Bell is a 57 y.o. male who presents with chief complaint: L bypass stenosis.  The patient presents today for LLE angiogram, possible intervention.    Past Medical History  Diagnosis Date  . Migraines   . Hypoglycemia   . Seizures   . Chronic knee pain   . Chronic ankle pain     Past Surgical History  Procedure Date  . Femoral-tibial bypass graft 09/06/2011    Procedure: BYPASS GRAFT FEMORAL-TIBIAL ARTERY;  Surgeon: Fransisco Hertz, MD;  Location: [facility] OR;  Service: Vascular;  Laterality: Left;  . Fasciotomy 09/06/2011    Procedure: FASCIOTOMY;  Surgeon: Fransisco Hertz, MD;  Location: [facility] OR;  Service: Vascular;  Laterality: Left;  . Pr vein bypass graft,aorto-fem-pop 09/06/11    Left     History   Social History  . Marital Status: Single    Spouse Name: N/A    Number of Children: N/A  . Years of Education: N/A   Occupational History  . Not on file.   Social History Main Topics  . Smoking status: Current Every Day Smoker -- 0.5 packs/day    Types: Cigarettes  . Smokeless tobacco: Never Used     Comment: pt states that he is trying his best to quit  . Alcohol Use: Yes     Comment: 3 or 4 times a week  . Drug Use: Yes    Special: Marijuana  . Sexually Active: Yes   Other Topics Concern  . Not on file   Social History Narrative  . No narrative on file    Family History  Problem Relation Age of Onset  . Diabetes      No current facility-administered medications on file prior to encounter.   Current Outpatient Prescriptions on File Prior to Encounter  Medication Sig Dispense Refill  . alprazolam (XANAX) 2 MG tablet Take 2 mg by mouth at bedtime.       Marland Kitchen buPROPion (ZYBAN) 150 MG 12 hr tablet Take 150 mg by mouth 2 (two) times daily.      Marland Kitchen gabapentin (NEURONTIN) 300 MG capsule Take 1 capsule (300 mg total) by mouth 3 (three) times daily.  9 capsule  0  .  SUMAtriptan (IMITREX) 6 MG/0.5ML SOLN injection Inject 6 mg into the skin every 2 (two) hours as needed. For migraines.      . topiramate (TOPAMAX) 100 MG tablet Take 200 mg by mouth daily.        No Known Allergies  Review of Systems: As listed above, otherwise negative.  Physical Examination  Filed Vitals:   04/15/12 0702  BP: 120/74  Pulse: 58  Temp: 97.2 F (36.2 C)  TempSrc: Oral  Resp: 18  Height: 5\' 10"  (1.778 m)  Weight: 125 lb (56.7 kg)  SpO2: 98%    General: A&O x 3, WDWN  Pulmonary: Sym exp, good air movt, CTAB, no rales, rhonchi, & wheezing  Cardiac: RRR, Nl S1, S2, no Murmurs, rubs or gallops  Gastrointestinal: soft, NTND, -G/R, - HSM, - masses, - CVAT B  Musculoskeletal: M/S 5/5 throughout , Extremities without ischemic changes , strong pulse in L fem-AT bypass   Laboratory See iStat  Medical Decision Making  Brady Bell is a 57 y.o. male who presents with: likely L fem-AT bypass stenosis.   The patient is scheduled for: LLE angiogram, possible intervention of L fem-AT bypass  I discussed with the patient the nature of angiographic procedures, especially the limited patencies of any endovascular intervention.  The patient is aware of that the risks of an angiographic procedure include but are not limited to: bleeding, infection, access site complications, renal failure, embolization, rupture of vessel, dissection, possible need for emergent surgical intervention, possible need for surgical procedures to treat the patient's pathology, and stroke and death.    The patient is aware of the risks and agrees to proceed.  Leonides Sake, MD [facility] [place] Office: [phone] Pager: [phone]  04/15/2012, 6:52 AM

## 2012-04-16 LAB — CBC
HCT: 45 % (ref 39.0–52.0)
Hemoglobin: 14.7 g/dL (ref 13.0–17.0)
RBC: 4.61 MIL/uL (ref 4.22–5.81)
WBC: 5.6 10*3/uL (ref 4.0–10.5)

## 2012-04-16 MED ORDER — OXYCODONE HCL 5 MG PO TABS: 5.0000 mg | ORAL_TABLET | ORAL | Status: DC | PRN

## 2012-04-16 NOTE — Progress Notes (Signed)
[facility] of [place]  Daily Progress Note  Assessment/Planning: POD #1 s/p LRo   No access complication  D/C home  Smoking cessation emphasized as he has stenoses in his native CFA and native AT  Subjective  - 1 Day Post-Op  No complaints  Objective Filed Vitals:   04/15/12 1500 04/15/12 1530 04/15/12 2100 04/16/12 0443  BP: 131/87 119/79 129/78 94/65  Pulse: 65 63 59 65  Temp: 98 F (36.7 C) 98 F (36.7 C) 98.1 F (36.7 C) 97.9 F (36.6 C)  TempSrc:   Oral Oral  Resp: 18 18 16 18   Height:      Weight:      SpO2: 98% 98% 98% 96%    Intake/Output Summary (Last 24 hours) at 04/16/12 0731 Last data filed at 04/15/12 2246  Gross per 24 hour  Intake    358 ml  Output      0 ml  Net    358 ml    PULM  CTAB CV  RRR GI  soft, NTND VASC  R groin without hematoma or bruit  Laboratory CBC    Component Value Date/Time   WBC 5.6 04/16/2012 0618   HGB 14.7 04/16/2012 0618   HCT 45.0 04/16/2012 0618   PLT 204 04/16/2012 0618    BMET    Component Value Date/Time   NA 138 04/15/2012 0711   K 3.7 04/15/2012 0711   CL 108 04/15/2012 0711   CO2 26 09/07/2011 0445   GLUCOSE 98 04/15/2012 0711   BUN 7 04/15/2012 0711   CREATININE 0.81 04/15/2012 1529   CALCIUM 8.1* 09/07/2011 0445   GFRNONAA >90 04/15/2012 1529   GFRAA >90 04/15/2012 1529    Leonides Sake, MD [facility] [place] Office: [phone] Pager: [phone]  04/16/2012, 7:31 AM

## 2012-04-16 NOTE — Progress Notes (Signed)
[facility] [place]  Subjective  - POD #1  PROCEDURE:  1. Right common femoral artery cannulation under ultrasound guidance 2. Third order arterial selection 3. Left leg runoff 4.   Objective 94/65 65 97.9 F (36.6 C) (Oral) 18 96%  Intake/Output Summary (Last 24 hours) at 04/16/12 0738 Last data filed at 04/15/12 2246  Gross per 24 hour  Intake    358 ml  Output      0 ml  Net    358 ml    Left groin clean dry and no active drainage Skin distally warm, sensation intact, and active range.   Assessment/Planning: POD #1 PROCEDURE:  5. Right common femoral artery cannulation under ultrasound guidance 6. Third order arterial selection 7. Left leg runoff  F/U in 2 weeks  Clinton Gallant [facility] 04/16/2012 7:38 AM --  Laboratory Lab Results:  Basename 04/16/12 0618 04/15/12 1529  WBC 5.6 6.6  HGB 14.7 15.5  HCT 45.0 44.4  PLT 204 221   BMET  Basename 04/15/12 1529 04/15/12 0711  NA -- 138  K -- 3.7  CL -- 108  CO2 -- --  GLUCOSE -- 98  BUN -- 7  CREATININE 0.81 1.00  CALCIUM -- --    COAG Lab Results  Component Value Date   INR 0.94 09/04/2011   No results found for this basename: PTT    Antibiotics Anti-infectives    None

## 2012-04-16 NOTE — Discharge Summary (Signed)
[facility] Discharge Summary   Patient ID:  Brady Bell MRN: [id number] DOB/AGE: [birth year] 55 y.o.  Admit date: 04/15/2012 Discharge date: 04/16/2012 Date of Surgery: 04/15/2012 Surgeon: Surgeon(s): Fransisco Hertz, MD  Admission Diagnosis: pad  Discharge Diagnoses:  pad  Secondary Diagnoses: Past Medical History  Diagnosis Date  . Migraines   . Hypoglycemia   . Seizures   . Chronic knee pain   . Chronic ankle pain   . Peripheral vascular disease     Procedure(s): LOWER EXTREMITY ANGIOGRAM  Discharged Condition: good  HPI: Brady Bell is a 57 y.o. male who presents with chief complaint: L bypass stenosis. The patient presents today for LLE angiogram, possible intervention.     Hospital Course:  Brady Bell is a 57 y.o. male is S/P Left Procedure(s): LOWER EXTREMITY ANGIOGRAM Extubated: POD # 0 Post-op wounds clean, dry, intact or healing well Pt. Ambulating, voiding and taking PO diet without difficulty. Pt pain controlled with PO pain meds. Labs as below Complications:none  Consults:     Significant Diagnostic Studies: CBC Lab Results  Component Value Date   WBC 5.6 04/16/2012   HGB 14.7 04/16/2012   HCT 45.0 04/16/2012   MCV 97.6 04/16/2012   PLT 204 04/16/2012    BMET    Component Value Date/Time   NA 138 04/15/2012 0711   K 3.7 04/15/2012 0711   CL 108 04/15/2012 0711   CO2 26 09/07/2011 0445   GLUCOSE 98 04/15/2012 0711   BUN 7 04/15/2012 0711   CREATININE 0.81 04/15/2012 1529   CALCIUM 8.1* 09/07/2011 0445   GFRNONAA >90 04/15/2012 1529   GFRAA >90 04/15/2012 1529   COAG Lab Results  Component Value Date   INR 0.94 09/04/2011     Disposition:  Discharge to :Home Discharge Orders    Future Appointments: Provider: Department: Dept Phone: Center:   05/07/2012 12:30 PM Fransisco Hertz, MD [facility] [phone] [facility]     Future Orders Please Complete By Expires   Resume previous diet      Lifting  restrictions      Comments:   No lifting for 6 weeks   Call MD for:  temperature >100.5      Call MD for:  redness, tenderness, or signs of infection (pain, swelling, bleeding, redness, odor or green/yellow discharge around incision site)      Call MD for:  severe or increased pain, loss or decreased feeling  in affected limb(s)      may wash over wound with mild soap and water         Brady Bell, Brady Bell  Home Medication Instructions [id number]   Printed on:04/16/12 0740  Medication Information                    SUMAtriptan (IMITREX) 6 MG/0.5ML SOLN injection Inject 6 mg into the skin every 2 (two) hours as needed. For migraines.           alprazolam (XANAX) 2 MG tablet Take 2 mg by mouth at bedtime.            topiramate (TOPAMAX) 100 MG tablet Take 200 mg by mouth daily.           gabapentin (NEURONTIN) 300 MG capsule Take 1 capsule (300 mg total) by mouth 3 (three) times daily.           buPROPion (ZYBAN) 150 MG 12 hr tablet Take 150 mg by mouth  2 (two) times daily.           traMADol (ULTRAM) 50 MG tablet Take 50-100 mg by mouth every 6 (six) hours as needed. PAIN           oxyCODONE (OXYCONTIN) 10 MG 12 hr tablet Take 10 mg by mouth every 12 (twelve) hours. PAIN            Verbal and written Discharge instructions given to the patient. Wound care per Discharge AVS Follow-up Information    Follow up with Nilda Simmer, MD. In 5 weeks. (Office will call you to arrange your appt(sent))    Contact information:   [address] [place] [address] [phone]          Signed: Clinton Gallant [facility] 04/16/2012, 7:40 AM  Addendum  I have independently interviewed and examined the patient, and I agree with the physician assistant's discharge summary.  Patient underwent a diagnostic left leg runoff which demonstrates 50% stenosis in L CFA and <30% stenosis in native AT.  The bypass conduit was widely patent.  Pt had no access site complication is being d/c POD  #1  Leonides Sake, MD [facility] [place] Office: [phone] Pager: [phone]  04/16/2012, 7:43 AM

## 2012-05-06 ENCOUNTER — Encounter: Payer: Self-pay | Admitting: Vascular Surgery

## 2012-05-07 ENCOUNTER — Ambulatory Visit (INDEPENDENT_AMBULATORY_CARE_PROVIDER_SITE_OTHER): Payer: Self-pay | Admitting: Vascular Surgery

## 2012-05-07 ENCOUNTER — Ambulatory Visit: Payer: Self-pay | Admitting: Vascular Surgery

## 2012-05-07 ENCOUNTER — Encounter: Payer: Self-pay | Admitting: Vascular Surgery

## 2012-05-07 VITALS — BP 143/91 | HR 79 | Resp 20 | Ht 70.0 in | Wt 129.0 lb

## 2012-05-07 DIAGNOSIS — Z48812 Encounter for surgical aftercare following surgery on the circulatory system: Secondary | ICD-10-CM

## 2012-05-07 DIAGNOSIS — I739 Peripheral vascular disease, unspecified: Secondary | ICD-10-CM | POA: Insufficient documentation

## 2012-05-07 NOTE — Progress Notes (Signed)
[facility] [place]  Postoperative Visit  History of Present Illness  Brady Bell is a 57 y.o. male who presents for postoperative follow-up from procedure on Date: 04/15/12 diagnostic L leg runoff.  The patient's wounds are healed.  The patient notes resolution of lower extremity symptoms.  The patient is able to complete their activities of daily living.  The patient's current symptoms are: left knee pain.  The knee pain sx are c/w osteoarthritis.  The patient is having significant financial problems and is possibly going to be homeless soon.  He is in the process of getting support from the local government.  Past Medical History, Past Surgical History, Social History, Family History, Medications, Allergies, and Review of Systems are unchanged from previous evaluation on 04/15/12.  Physical Examination  Filed Vitals:   05/07/12 1052  BP: 143/91  Pulse: 79  Resp: 20  Height: 5\' 10"  (1.778 m)  Weight: 129 lb (58.514 kg)   Body mass index is 18.51 kg/(m^2).  General: A&O x 3, WDWN, odor of cigarettes  Pulmonary: Sym exp, good air movt, CTAB, no rales, rhonchi, & wheezing  Cardiac: RRR, Nl S1, S2, no Murmurs, rubs or gallops  Vascular: Vessel Right Left  Radial Palpable Palpable  Ulnar Palpable Palpable  Brachial Palpable Palpable  Carotid Palpable, without bruit Palpable, without bruit  Aorta Not palpable N/A  Femoral Palpable Palpable  Popliteal Not palpable Not palpable  PT Palpable Palpable  DP Palpable Palpable    Gastrointestinal: soft, NTND, -G/R, - HSM, - masses, - CVAT B  Musculoskeletal: M/S 5/5 throughout , Extremities without ischemic changes , right groin without hematoma, no echymosis present at cannulation site, palpable bypass graft pulse throughout  Neurologic:  Pain and light touch intact in extremities , Motor exam as listed above  Medical Decision Making  Brady Bell is a 57 y.o. male who presents s/p LRo. Based on his  angiographic findings, this patient needs: q3 month ABI and L arterial duplex. I reiterated the importance of smoking cessation.  The patient is aware and is still trying to quit on his own. I discussed in depth with the patient the nature of atherosclerosis, and emphasized the importance of maximal medical management including strict control of blood pressure, blood glucose, and lipid levels, obtaining regular exercise, and cessation of smoking.  The patient is aware that without maximal medical management the underlying atherosclerotic disease process will progress, limiting the benefit of any interventions.  Thank you for allowing [place] to participate in this patient's care.  Leonides Sake, MD [facility] [place] Office: [phone] Pager: [phone]

## 2012-05-07 NOTE — Addendum Note (Signed)
Addended by: Sharee Pimple on: 05/07/2012 04:08 PM   Modules accepted: Orders

## 2012-06-15 DIAGNOSIS — Z0279 Encounter for issue of other medical certificate: Secondary | ICD-10-CM

## 2012-07-20 ENCOUNTER — Telehealth: Payer: Self-pay

## 2012-07-20 NOTE — Telephone Encounter (Signed)
Pt. Called to make Dr. Imogene Burn aware of left knee pain, and increased swelling/inflammation over past week. Denies any recent injury to the left knee. Stated left knee is so painful that he "took over 1000mg  of Ibuprofen this morning".  Saw Dr. Juanetta Gosling yesterday, and was advised to go the the [facility] ER, to have a vascular study to r/o DVT.  Pt. stated he is going to the [facility] ER today, but wanted to make [facility] aware of this new symptom.  Encouraged to follow through with Dr. Juanetta Gosling recommendation.  Verb. Understanding.

## 2012-07-21 ENCOUNTER — Emergency Department (HOSPITAL_COMMUNITY): Payer: Medicaid Other

## 2012-07-21 ENCOUNTER — Encounter (HOSPITAL_COMMUNITY): Payer: Self-pay

## 2012-07-21 ENCOUNTER — Emergency Department (HOSPITAL_COMMUNITY)
Admission: EM | Admit: 2012-07-21 | Discharge: 2012-07-21 | Disposition: A | Discharge status: Home / Self Care | Payer: Medicaid Other | Attending: Emergency Medicine | Admitting: Emergency Medicine

## 2012-07-21 DIAGNOSIS — G8929 Other chronic pain: Secondary | ICD-10-CM | POA: Insufficient documentation

## 2012-07-21 DIAGNOSIS — F17200 Nicotine dependence, unspecified, uncomplicated: Secondary | ICD-10-CM | POA: Insufficient documentation

## 2012-07-21 DIAGNOSIS — G40909 Epilepsy, unspecified, not intractable, without status epilepticus: Secondary | ICD-10-CM | POA: Insufficient documentation

## 2012-07-21 DIAGNOSIS — Z79899 Other long term (current) drug therapy: Secondary | ICD-10-CM | POA: Insufficient documentation

## 2012-07-21 DIAGNOSIS — M25569 Pain in unspecified knee: Secondary | ICD-10-CM | POA: Insufficient documentation

## 2012-07-21 DIAGNOSIS — I739 Peripheral vascular disease, unspecified: Secondary | ICD-10-CM | POA: Insufficient documentation

## 2012-07-21 DIAGNOSIS — Z9889 Other specified postprocedural states: Secondary | ICD-10-CM | POA: Insufficient documentation

## 2012-07-21 DIAGNOSIS — Z8639 Personal history of other endocrine, nutritional and metabolic disease: Secondary | ICD-10-CM | POA: Insufficient documentation

## 2012-07-21 DIAGNOSIS — Z862 Personal history of diseases of the blood and blood-forming organs and certain disorders involving the immune mechanism: Secondary | ICD-10-CM | POA: Insufficient documentation

## 2012-07-21 DIAGNOSIS — M25562 Pain in left knee: Secondary | ICD-10-CM

## 2012-07-21 DIAGNOSIS — G43909 Migraine, unspecified, not intractable, without status migrainosus: Secondary | ICD-10-CM | POA: Insufficient documentation

## 2012-07-21 NOTE — ED Notes (Signed)
Pt c/o pain to left knee for past few days.  Denies injury.  History of bypass to same leg after a fall several years ago.

## 2012-07-21 NOTE — ED Notes (Signed)
Pt seen and evaluated by EDPa for initial assessment. 

## 2012-07-21 NOTE — ED Provider Notes (Signed)
History     CSN: [id number]  Arrival date & time 07/21/12  1123   First MD Initiated Contact with Patient 07/21/12 1128      Chief Complaint  Patient presents with  . Knee Pain    (Consider location/radiation/quality/duration/timing/severity/associated sxs/prior treatment) Patient is a 57 y.o. male presenting with knee pain. The history is provided by the patient.  Knee Pain Location:  Knee Time since incident:  4 days Injury: no   Knee location:  L knee Pain details:    Quality:  Shooting and sharp   Radiates to:  Does not radiate   Severity:  Severe Chronicity:  New Dislocation: no   Associated symptoms: no fever and no neck pain   Risk factors comment:  Past surgery  MAJOUR FREI is a 57 y.o. male who presents to the ED with left knee pain. The pain started 4 days ago. He went to see Dr. Juanetta Gosling and he wanted to make sure there was not blood clot so sent patient to the ED. Patient has had vascular surgery to the left knee.  Past Medical History  Diagnosis Date  . Migraines   . Hypoglycemia   . Seizures   . Chronic knee pain   . Chronic ankle pain   . Peripheral vascular disease     Past Surgical History  Procedure Laterality Date  . Femoral-tibial bypass graft  09/06/2011    Procedure: BYPASS GRAFT FEMORAL-TIBIAL ARTERY;  Surgeon: Fransisco Hertz, MD;  Location: [facility] OR;  Service: Vascular;  Laterality: Left;  . Fasciotomy  09/06/2011    Procedure: FASCIOTOMY;  Surgeon: Fransisco Hertz, MD;  Location: [facility] OR;  Service: Vascular;  Laterality: Left;  . Pr vein bypass graft,aorto-fem-pop  09/06/11    Left     Family History  Problem Relation Age of Onset  . Diabetes    . Alcohol abuse Father     History  Substance Use Topics  . Smoking status: Current Every Day Smoker -- 0.50 packs/day for 30 years    Types: Cigarettes  . Smokeless tobacco: Never Used     Comment: pt states that he is trying his best to quit  . Alcohol Use: Yes     Comment: 3 or 4 times a week       Review of Systems  Constitutional: Negative for fever, chills and activity change.  HENT: Negative for neck pain.   Respiratory: Negative for chest tightness and shortness of breath.   Cardiovascular: Negative for chest pain.  Gastrointestinal: Negative for nausea and vomiting.  Musculoskeletal:       Left knee pain  Skin: Negative for rash.    Allergies  Review of patient's allergies indicates no known allergies.  Home Medications   Current Outpatient Rx  Name  Route  Sig  Dispense  Refill  . alprazolam (XANAX) 2 MG tablet   Oral   Take 2 mg by mouth at bedtime.          Marland Kitchen buPROPion (ZYBAN) 150 MG 12 hr tablet   Oral   Take 150 mg by mouth 2 (two) times daily.         Marland Kitchen gabapentin (NEURONTIN) 300 MG capsule   Oral   Take 1 capsule (300 mg total) by mouth 3 (three) times daily.   9 capsule   0   . oxyCODONE (OXY IR/ROXICODONE) 5 MG immediate release tablet   Oral   Take 1-2 tablets (5-10 mg total) by mouth every 4 (  four) hours as needed.   30 tablet   0   . oxyCODONE (OXYCONTIN) 10 MG 12 hr tablet   Oral   Take 10 mg by mouth every 12 (twelve) hours. PAIN         . SUMAtriptan (IMITREX) 6 MG/0.5ML SOLN injection   Subcutaneous   Inject 6 mg into the skin every 2 (two) hours as needed. For migraines.         . topiramate (TOPAMAX) 100 MG tablet   Oral   Take 200 mg by mouth daily.         . traMADol (ULTRAM) 50 MG tablet   Oral   Take 50-100 mg by mouth every 6 (six) hours as needed. PAIN         Ht 5\' 10"  (1.778 m)  Wt 135 lb (61.236 kg)  BMI 19.37 kg/m2   Ht 5\' 10"  (1.778 m)  Wt 135 lb (61.236 kg)  BMI 19.37 kg/m2  Physical Exam  Nursing note and vitals reviewed. Constitutional: He is oriented to person, place, and time. He appears well-developed and well-nourished. No distress.  HENT:  Head: Normocephalic.  Eyes: EOM are normal.  Neck: Neck supple.  Pulmonary/Chest: Effort normal.  Musculoskeletal:       Left knee: He  exhibits decreased range of motion. He exhibits no deformity and no erythema. Tenderness found. MCL tenderness noted.  Pedal pulses present bilateral. Adequate circulation.  Neurological: He is alert and oriented to person, place, and time. He has normal strength. No cranial nerve deficit or sensory deficit.  Skin: Skin is warm and dry.  Psychiatric: He has a normal mood and affect. His behavior is normal. Judgment and thought content normal.   US Venous Img Lower Unilateral Left  07/21/2012  *RADIOLOGY REPORT*  Clinical Data: Left lower extremity pain and edema localizing to the region of the knee.  LEFT LOWER EXTREMITY VENOUS DUPLEX ULTRASOUND  Technique:  Gray-scale sonography with graded compression, as well as color Doppler and duplex ultrasound were performed to evaluate the deep venous system of the lower extremity from the level of the common femoral vein through the popliteal and proximal calf veins. Spectral Doppler was utilized to evaluate flow at rest and with distal augmentation maneuvers.  Comparison:  09/04/2011.  Findings:  Normal compressibility of the common femoral, superficial femoral, and popliteal veins is demonstrated, as well as the visualized proximal calf veins.  No filling defects to suggest DVT on grayscale or color Doppler imaging.  Doppler waveforms show normal direction of venous flow, normal respiratory phasicity and response to augmentation.  No evidence of superficial thrombophlebitis or abnormal fluid collection.  IMPRESSION: No evidence of left lower extremity deep vein thrombosis.   Original Report Authenticated By: Irish Lack, M.D.     ED Course  Procedures (including critical care time)  Assessment: 57 y.o. male with left knee pain  Plan:  Called Dr. Juanetta Gosling with results of doppler study and he will arrange follow up for the patient. Patient has a knee immobilizer at home that he will use. He will go to Dr. Juanetta Gosling office now for pain management and further  instructions. MDM  Discussed with the patient and all questioned fully answered. He will return if any problems arise.         Janne Napoleon, [place] 07/21/12 1730

## 2012-07-22 NOTE — ED Provider Notes (Signed)
Medical screening examination/treatment/procedure(s) were performed by non-physician practitioner and as supervising physician I was immediately available for consultation/collaboration.  Heily Carlucci, MD 07/22/12 1128 

## 2012-08-05 ENCOUNTER — Encounter: Payer: Self-pay | Admitting: Vascular Surgery

## 2012-08-06 ENCOUNTER — Encounter (INDEPENDENT_AMBULATORY_CARE_PROVIDER_SITE_OTHER): Payer: Medicaid Other | Admitting: *Deleted

## 2012-08-06 ENCOUNTER — Encounter: Payer: Self-pay | Admitting: Vascular Surgery

## 2012-08-06 ENCOUNTER — Ambulatory Visit (INDEPENDENT_AMBULATORY_CARE_PROVIDER_SITE_OTHER): Payer: Medicaid Other | Admitting: Vascular Surgery

## 2012-08-06 VITALS — BP 141/85 | HR 67 | Ht 70.0 in | Wt 141.4 lb

## 2012-08-06 DIAGNOSIS — Z48812 Encounter for surgical aftercare following surgery on the circulatory system: Secondary | ICD-10-CM

## 2012-08-06 DIAGNOSIS — I739 Peripheral vascular disease, unspecified: Secondary | ICD-10-CM

## 2012-08-06 DIAGNOSIS — M79609 Pain in unspecified limb: Secondary | ICD-10-CM

## 2012-08-06 NOTE — Addendum Note (Signed)
Addended by: Adria Dill L on: 08/06/2012 01:54 PM   Modules accepted: Orders

## 2012-08-06 NOTE — Progress Notes (Signed)
[facility] [place]  Established Previous Bypass  History of Present Illness  Brady Bell is a 57 y.o. (Apr 26, 1955) male who presents with chief complaint: routine follow-up.  Previous operation(s) include: L CFA to AT bypass with NR GSV (Date: 09/06/11). The patient's symptoms have resolved. The patient's R knee problems are worsening and per him are interfering with him working.  The patient's treatment regimen currently included: maximal medical management.  The patient continues to smoke.   He has had multiple personal issues which has lead him to regress on his smoking cessation.  Past Medical History, Past Surgical History, Social History, Family History, Medications, Allergies, and Review of Systems are unchanged from previous evaluation on 05/07/12.  Physical Examination  Filed Vitals:   08/06/12 1106  BP: 141/85  Pulse: 67  Height: 5\' 10"  (1.778 m)  Weight: 141 lb 6.4 oz (64.139 kg)  SpO2: 100%   Body mass index is 20.29 kg/(m^2).  General: A&O x 3, WDWN  Pulmonary: Sym exp, good air movt, CTAB, no rales, rhonchi, & wheezing  Cardiac: RRR, Nl S1, S2, no Murmurs, rubs or gallops  Vascular: Vessel Right Left  Radial Palpable Palpable  Brachial Palpable Palpable  Carotid Palpable, without bruit Palpable, without bruit  Aorta Non-palpable N/A  Femoral Palpable Palpable  Popliteal Non-palpable Non-palpable  PT Palpable Palpable  DP Palpable Palpable   Musculoskeletal: M/S 5/5 throughout , Extremities without ischemic changes , palpable graft pulse  Neurologic: Pain and light touch intact in extremities , Motor exam as listed above  Non-Invasive Vascular Imaging ABI (Date: 08/06/12)  R: 1.25 (1.32), DP: bi, PT: bi, TBI 0.74  L: 1.04 (1.16), DP: bi, PT: bi, TBI 0.60  LLE bypass duplex (Date: 08/06/12)  Widely patent bypass with biphasic and triphasic flow throughout  Step up in velocities distal felt to be due to vessel caliber change (361 cs <-  212 c/s)  Medical Decision Making  Brady Bell is a 57 y.o. male who presents with: s/p L CFA to AT bypass .  Based on the patient's vascular studies and examination, I have offered the patient: continued surveillance in 3 months with BLE ABI and LLE bypass duplex.  I have concerns that his AT disease is progressing with continued smoking.  I have discussed the importance of smoking cessation with him multiple times.  The patient has significant psychosocial problems that would interfere with his ability to maintain use of pharmacologic therapy and counseling at this time.  I discussed in depth with the patient the nature of atherosclerosis, and emphasized the importance of maximal medical management including strict control of blood pressure, blood glucose, and lipid levels, obtaining regular exercise, and cessation of smoking.  The patient is aware that without maximal medical management the underlying atherosclerotic disease process will progress, limiting the benefit of any interventions.  I discussed in depth with the patient the need for long term surveillance to improve the primary assisted patency of his bypass.  The patient agrees to cooperate with such.  The patient is scheduled for the previous listed surveillance studies in 3 months.  Thank you for allowing [place] to participate in this patient's care.  Leonides Sake, MD [facility] [place] Office: [phone] Pager: [phone]  08/06/2012, 1:21 PM

## 2012-08-17 ENCOUNTER — Ambulatory Visit (INDEPENDENT_AMBULATORY_CARE_PROVIDER_SITE_OTHER): Payer: Medicaid Other | Admitting: Orthopedic Surgery

## 2012-08-17 ENCOUNTER — Ambulatory Visit (INDEPENDENT_AMBULATORY_CARE_PROVIDER_SITE_OTHER): Payer: Medicaid Other

## 2012-08-17 VITALS — BP 122/84 | Ht 70.0 in | Wt 145.0 lb

## 2012-08-17 DIAGNOSIS — M238X9 Other internal derangements of unspecified knee: Secondary | ICD-10-CM

## 2012-08-17 DIAGNOSIS — M25569 Pain in unspecified knee: Secondary | ICD-10-CM

## 2012-08-17 DIAGNOSIS — M25362 Other instability, left knee: Secondary | ICD-10-CM

## 2012-08-17 DIAGNOSIS — M25562 Pain in left knee: Secondary | ICD-10-CM

## 2012-08-18 ENCOUNTER — Encounter: Payer: Self-pay | Admitting: Orthopedic Surgery

## 2012-08-18 NOTE — Progress Notes (Signed)
Patient ID: Brady Bell, male   DOB: 10-02-1955, 57 y.o.   MRN: [id number] Chief Complaint  Patient presents with  . Knee Pain    Left knee pain, referred by Dr. Juanetta Gosling     History this patient has an interesting history of is a bypass graft on his left leg since with 2 week history of severe knee pain the pain came on gradually without trauma he reports throbbing burning 9/10 constant pain associated with swelling but denies locking catching numbness or tingling he does say he had some bruising in his knee. 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 Migraines   . Hypoglycemia   . Seizures   . Chronic knee pain   . Chronic ankle pain   . Peripheral vascular disease    Past Surgical History  Procedure Laterality Date  . Femoral-tibial bypass graft  09/06/2011    Procedure: BYPASS GRAFT FEMORAL-TIBIAL ARTERY;  Surgeon: Fransisco Hertz, MD;  Location: [facility] OR;  Service: Vascular;  Laterality: Left;  . Fasciotomy  09/06/2011    Procedure: FASCIOTOMY;  Surgeon: Fransisco Hertz, MD;  Location: [facility] OR;  Service: Vascular;  Laterality: Left;  . Pr vein bypass graft,aorto-fem-pop  09/06/11    Left    Physical exam shows stable vital signs.BP 122/84  Ht 5\' 10"  (1.778 m)  Wt 145 lb (65.772 kg)  BMI 20.81 kg/m2 He is a very thin ectomorphic body build he appears without deformity. He is oriented x3 his mood is anxious. His ambulation is noted for favoring his left leg  He has what appears to be a palpable graft on the lateral side of his knee. He has no tenderness along the joint lines but he is hyperreactive to light touch. If  there were Waddell signs of the knee he has some. No joint effusion. He complains of pain with range of motion of his knee which is full although he resists passive range of motion. His knee feels stable motor exam is normal skin is intact without rash has normal sensation and no lymphadenopathy.  His x-ray does not show any abnormality other than some mild degenerative change  I reviewed his MRI from 2012 as noted.  I am not sure as to what is going on with this gentleman he appears to be overreacting to light palpation. I think he still has some depression and anxiety from the loss of his mother  Since he did have the undersurface meniscal tear I agreed to get an MRI of his knee to see if that's any worse but my general impression is that there is nothing wrong with his knee and he is transferring his depression anxiety and grieving to the knee. If he does need surgery I would not feel comfortable doing it with this graft around the knee and risk of vascular compromise.

## 2012-08-27 ENCOUNTER — Telehealth: Payer: Self-pay | Admitting: Orthopedic Surgery

## 2012-08-27 NOTE — Telephone Encounter (Signed)
Contacted financial counselor Lubertha Basque and clinic site manager regarding status of hospital/[place] system discount, as well as call to patient.  Notes indicate that discount termed 08/23/12 and that review is ongoing. System notes also indicate that patient is not eligible for Medicaid.  MRI pending, awaiting further information as to eligibility for discount.  Patient aware of status, and relays that he has requested the information required for the financial counselor, and will submit it as soon as it is available.  He elects to wait for this process; states he is unable to self-pay for MRI and additional medical expenses.

## 2012-09-21 DIAGNOSIS — Z0279 Encounter for issue of other medical certificate: Secondary | ICD-10-CM

## 2012-10-05 ENCOUNTER — Ambulatory Visit (HOSPITAL_COMMUNITY)
Admission: RE | Admit: 2012-10-05 | Discharge: 2012-10-05 | Disposition: A | Discharge status: Home / Self Care | Payer: Medicaid Other | Source: Ambulatory Visit | Attending: Pulmonary Disease | Admitting: Pulmonary Disease

## 2012-10-05 DIAGNOSIS — IMO0001 Reserved for inherently not codable concepts without codable children: Secondary | ICD-10-CM | POA: Insufficient documentation

## 2012-10-05 DIAGNOSIS — M25569 Pain in unspecified knee: Secondary | ICD-10-CM | POA: Insufficient documentation

## 2012-10-19 ENCOUNTER — Telehealth: Payer: Self-pay | Admitting: Orthopedic Surgery

## 2012-10-19 NOTE — Telephone Encounter (Signed)
Received call back from patient and from financial counselor Roberts Gaudy at [facility].  Patient has been re-approved for [place] discount, effective immediately; therefore, requesting MRI to be scheduled at [facility] per previous notes.  Patient states he has a hearing 10/29/12, and is asking if we can schedule MRI prior to this date.  Please advise patient, ph# [phone].

## 2012-10-25 ENCOUNTER — Encounter (HOSPITAL_COMMUNITY): Payer: Self-pay

## 2012-10-25 ENCOUNTER — Ambulatory Visit (HOSPITAL_COMMUNITY)
Admission: RE | Admit: 2012-10-25 | Discharge: 2012-10-25 | Disposition: A | Discharge status: Home / Self Care | Payer: Medicaid Other | Source: Ambulatory Visit | Attending: Orthopedic Surgery | Admitting: Orthopedic Surgery

## 2012-10-25 DIAGNOSIS — M23329 Other meniscus derangements, posterior horn of medial meniscus, unspecified knee: Secondary | ICD-10-CM | POA: Insufficient documentation

## 2012-10-25 DIAGNOSIS — M25569 Pain in unspecified knee: Secondary | ICD-10-CM | POA: Insufficient documentation

## 2012-10-25 DIAGNOSIS — M25362 Other instability, left knee: Secondary | ICD-10-CM

## 2012-10-25 NOTE — Telephone Encounter (Signed)
Patient has been scheduled for his MRI, I called to give him his appointment and he was going to call and reschedule.

## 2012-10-26 ENCOUNTER — Other Ambulatory Visit (HOSPITAL_COMMUNITY): Payer: Self-pay

## 2012-10-27 ENCOUNTER — Telehealth: Payer: Self-pay | Admitting: Orthopedic Surgery

## 2012-10-27 NOTE — Telephone Encounter (Signed)
Patient called for MRI results, Brady Bell.  Last office note indicates call with results.  His phone # is [phone].

## 2012-11-05 ENCOUNTER — Ambulatory Visit: Payer: Self-pay | Admitting: Vascular Surgery

## 2012-11-21 ENCOUNTER — Emergency Department (HOSPITAL_COMMUNITY)
Admission: EM | Admit: 2012-11-21 | Discharge: 2012-11-21 | Disposition: A | Discharge status: Home / Self Care | Payer: Medicaid Other | Attending: Emergency Medicine | Admitting: Emergency Medicine

## 2012-11-21 ENCOUNTER — Encounter (HOSPITAL_COMMUNITY): Payer: Self-pay

## 2012-11-21 DIAGNOSIS — G43909 Migraine, unspecified, not intractable, without status migrainosus: Secondary | ICD-10-CM | POA: Insufficient documentation

## 2012-11-21 DIAGNOSIS — Z79899 Other long term (current) drug therapy: Secondary | ICD-10-CM | POA: Insufficient documentation

## 2012-11-21 DIAGNOSIS — R519 Headache, unspecified: Secondary | ICD-10-CM

## 2012-11-21 DIAGNOSIS — Z862 Personal history of diseases of the blood and blood-forming organs and certain disorders involving the immune mechanism: Secondary | ICD-10-CM | POA: Insufficient documentation

## 2012-11-21 DIAGNOSIS — G8929 Other chronic pain: Secondary | ICD-10-CM | POA: Insufficient documentation

## 2012-11-21 DIAGNOSIS — Z8679 Personal history of other diseases of the circulatory system: Secondary | ICD-10-CM | POA: Insufficient documentation

## 2012-11-21 DIAGNOSIS — Z8639 Personal history of other endocrine, nutritional and metabolic disease: Secondary | ICD-10-CM | POA: Insufficient documentation

## 2012-11-21 DIAGNOSIS — G40909 Epilepsy, unspecified, not intractable, without status epilepticus: Secondary | ICD-10-CM | POA: Insufficient documentation

## 2012-11-21 DIAGNOSIS — F17200 Nicotine dependence, unspecified, uncomplicated: Secondary | ICD-10-CM | POA: Insufficient documentation

## 2012-11-21 MED ORDER — SUMATRIPTAN SUCCINATE 6 MG/0.5ML ~~LOC~~ SOLN
6.0000 mg | Freq: Once | SUBCUTANEOUS | Status: AC
  Administered 2012-11-21: 6 mg via SUBCUTANEOUS
  Filled 2012-11-21: qty 0.5

## 2012-11-21 NOTE — ED Notes (Signed)
Pt stated he was receiving some relief prior to me leaving room and sat up and requested water. Water given. Nad. Pt still did not want to get into bed.

## 2012-11-21 NOTE — ED Provider Notes (Signed)
CSN: [id number]     Arrival date & time 11/21/12  1539 History    This chart was scribed for Benny Lennert, MD,  by Ashley Jacobs, ED Scribe. The patient was seen in room APA12/APA12 and the patient's care was started at 4:01 PM   First MD Initiated Contact with Patient 11/21/12 1552     Chief Complaint  Patient presents with  . Migraine   (Consider location/radiation/quality/duration/timing/severity/associated sxs/prior Treatment) Patient is a 57 y.o. male presenting with migraines. The history is provided by the patient and medical records. No language interpreter was used.  Migraine This is a chronic problem. The current episode started less than 1 hour ago. The problem occurs constantly. The problem has been gradually worsening. Associated symptoms include headaches. Pertinent negatives include no chest pain and no abdominal pain. Nothing aggravates the symptoms. Nothing relieves the symptoms.   HPI Comments: Brady Bell is a 57 y.o. male who presents to the Emergency Department complaining of intense migraine with onset of 15 minutes ago. Pt mentions taking Zyban and Imitrex for migraine pain but reports running out of medication PTA. He is is distress and holds his head between his knees. He reports having 3 migraines a day and mentions that this episode is similar in nature. Nothing seems to relieve or worsen his symptoms.   Past Medical History  Diagnosis Date  . Migraines   . Hypoglycemia   . Seizures   . Chronic knee pain   . Chronic ankle pain   . Peripheral vascular disease    Past Surgical History  Procedure Laterality Date  . Femoral-tibial bypass graft  09/06/2011    Procedure: BYPASS GRAFT FEMORAL-TIBIAL ARTERY;  Surgeon: Fransisco Hertz, MD;  Location: [facility] OR;  Service: Vascular;  Laterality: Left;  . Fasciotomy  09/06/2011    Procedure: FASCIOTOMY;  Surgeon: Fransisco Hertz, MD;  Location[facility] OR;  Service: Vascular;  Laterality: Left;  . Pr vein bypass  graft,aorto-fem-pop  09/06/11    Left    Family History  Problem Relation Age of Onset  . Diabetes    . Alcohol abuse Father    History  Substance Use Topics  . Smoking status: Current Every Day Smoker -- 0.50 packs/day for 30 years    Types: Cigarettes  . Smokeless tobacco: Never Used     Comment: pt states that he is trying his best to quit  . Alcohol Use: Yes     Comment: 3 or 4 times a week    Review of Systems  Constitutional: Negative for appetite change and fatigue.  HENT: Negative for congestion, sinus pressure and ear discharge.   Eyes: Negative for discharge.  Respiratory: Negative for cough.   Cardiovascular: Negative for chest pain.  Gastrointestinal: Negative for abdominal pain and diarrhea.  Genitourinary: Negative for frequency and hematuria.  Musculoskeletal: Negative for back pain.  Skin: Negative for rash.  Neurological: Positive for headaches. Negative for seizures.       Severe constant migranes  Psychiatric/Behavioral: Negative for hallucinations.  All other systems reviewed and are negative.    Allergies  Review of patient's allergies indicates no known allergies.  Home Medications   Current Outpatient Rx  Name  Route  Sig  Dispense  Refill  . alprazolam (XANAX) 2 MG tablet   Oral   Take 2 mg by mouth at bedtime.          Marland Kitchen buPROPion (ZYBAN) 150 MG 12 hr tablet   Oral  Take 150 mg by mouth 2 (two) times daily.         Marland Kitchen EXPIRED: gabapentin (NEURONTIN) 300 MG capsule   Oral   Take 1 capsule (300 mg total) by mouth 3 (three) times daily.   9 capsule   0   . oxyCODONE (OXY IR/ROXICODONE) 5 MG immediate release tablet   Oral   Take 1-2 tablets (5-10 mg total) by mouth every 4 (four) hours as needed.   30 tablet   0   . oxyCODONE (OXYCONTIN) 10 MG 12 hr tablet   Oral   Take 10 mg by mouth every 12 (twelve) hours. PAIN         . SUMAtriptan (IMITREX) 6 MG/0.5ML SOLN injection   Subcutaneous   Inject 6 mg into the skin every 2  (two) hours as needed. For migraines.         . topiramate (TOPAMAX) 100 MG tablet   Oral   Take 200 mg by mouth daily.         . traMADol (ULTRAM) 50 MG tablet   Oral   Take 50-100 mg by mouth every 6 (six) hours as needed. PAIN          BP 150/91  Pulse 70  Temp(Src) 97.4 F (36.3 C) (Oral)  Resp 20  Ht 5\' 10"  (1.778 m)  Wt 140 lb (63.504 kg)  BMI 20.09 kg/m2  SpO2 100% Physical Exam  Constitutional: He is oriented to person, place, and time. He appears well-developed.  HENT:  Head: Normocephalic.  Eyes: Conjunctivae and EOM are normal. No scleral icterus.  Neck: Neck supple. No thyromegaly present.  Cardiovascular: Normal rate and regular rhythm.  Exam reveals no gallop and no friction rub.   No murmur heard. Pulmonary/Chest: No stridor. He has no wheezes. He has no rales. He exhibits no tenderness.  Abdominal: He exhibits no distension. There is no tenderness. There is no rebound.  Musculoskeletal: Normal range of motion. He exhibits no edema.  Lymphadenopathy:    He has no cervical adenopathy.  Neurological: He is oriented to person, place, and time. Coordination normal.  Skin: No rash noted. No erythema.  Psychiatric: He has a normal mood and affect. His behavior is normal.    ED Course  DIAGNOSTIC STUDIES: Oxygen Saturation is 100% on room air, normal by my interpretation.    COORDINATION OF CARE: 4:11 PM Discussed course of care with pt . Pt understands and agrees.    Procedures (including critical care time)  Labs Reviewed - No data to display No results found. No diagnosis found.  MDM  The chart was scribed for me under my direct supervision.  I personally performed the history, physical, and medical decision making and all procedures in the evaluation of this patient.Benny Lennert, MD 11/21/12 [phone]

## 2012-11-21 NOTE — ED Notes (Signed)
Pt reports migraine for "less than 8 min" pta, is out if his imitrex.  His 3 migraines a day, is moaning in triage and holding head between his hands, able to answer ?'s

## 2012-11-22 ENCOUNTER — Encounter (HOSPITAL_COMMUNITY): Payer: Self-pay | Admitting: *Deleted

## 2012-11-22 ENCOUNTER — Emergency Department (HOSPITAL_COMMUNITY)
Admission: EM | Admit: 2012-11-22 | Discharge: 2012-11-22 | Disposition: A | Discharge status: Home / Self Care | Payer: Medicaid Other | Attending: Emergency Medicine | Admitting: Emergency Medicine

## 2012-11-22 ENCOUNTER — Encounter (HOSPITAL_COMMUNITY): Payer: Self-pay | Admitting: Emergency Medicine

## 2012-11-22 ENCOUNTER — Emergency Department (HOSPITAL_COMMUNITY)
Admission: EM | Admit: 2012-11-22 | Discharge: 2012-11-23 | Disposition: A | Discharge status: Home / Self Care | Payer: Medicaid Other | Source: Home / Self Care | Attending: Emergency Medicine | Admitting: Emergency Medicine

## 2012-11-22 DIAGNOSIS — G8929 Other chronic pain: Secondary | ICD-10-CM | POA: Insufficient documentation

## 2012-11-22 DIAGNOSIS — Z79899 Other long term (current) drug therapy: Secondary | ICD-10-CM | POA: Insufficient documentation

## 2012-11-22 DIAGNOSIS — G40909 Epilepsy, unspecified, not intractable, without status epilepticus: Secondary | ICD-10-CM | POA: Insufficient documentation

## 2012-11-22 DIAGNOSIS — Z862 Personal history of diseases of the blood and blood-forming organs and certain disorders involving the immune mechanism: Secondary | ICD-10-CM | POA: Insufficient documentation

## 2012-11-22 DIAGNOSIS — G43909 Migraine, unspecified, not intractable, without status migrainosus: Secondary | ICD-10-CM

## 2012-11-22 DIAGNOSIS — F17200 Nicotine dependence, unspecified, uncomplicated: Secondary | ICD-10-CM | POA: Insufficient documentation

## 2012-11-22 DIAGNOSIS — R0789 Other chest pain: Secondary | ICD-10-CM | POA: Insufficient documentation

## 2012-11-22 DIAGNOSIS — Z8679 Personal history of other diseases of the circulatory system: Secondary | ICD-10-CM | POA: Insufficient documentation

## 2012-11-22 DIAGNOSIS — Z8639 Personal history of other endocrine, nutritional and metabolic disease: Secondary | ICD-10-CM | POA: Insufficient documentation

## 2012-11-22 MED ORDER — SUMATRIPTAN SUCCINATE 6 MG/0.5ML ~~LOC~~ SOLN
6.0000 mg | Freq: Once | SUBCUTANEOUS | Status: AC
  Administered 2012-11-22: 6 mg via SUBCUTANEOUS
  Filled 2012-11-22: qty 0.5

## 2012-11-22 NOTE — ED Provider Notes (Signed)
CSN: [id number]     Arrival date & time 11/22/12  2321 History   First MD Initiated Contact with Patient 11/22/12 2323     Chief Complaint  Patient presents with  . Migraine   (Consider location/radiation/quality/duration/timing/severity/associated sxs/prior Treatment) HPI Comments: This patient is a 57 year old male with a past medical history significant for migraine headaches. He has had frequent, recurrent migraines over the past month. He normally has Imitrex at home that he takes for this, however he is out. He has no insurance and no way of getting more of this. He has been here twice in the past 2 days for the same. He denies any visual changes, fever, neck pain. He states that this is similar to his prior migraines.  Patient is a 57 y.o. male presenting with migraines. The history is provided by the patient.  Migraine This is a recurrent problem. The current episode started 1 to 2 hours ago. The problem occurs constantly. The problem has been rapidly worsening. Associated symptoms include chest pain and headaches. Pertinent negatives include no abdominal pain and no shortness of breath. Exacerbated by: lights, movement. Nothing relieves the symptoms. He has tried nothing for the symptoms. The treatment provided no relief.    Past Medical History  Diagnosis Date  . Migraines   . Hypoglycemia   . Seizures   . Chronic knee pain   . Chronic ankle pain   . Peripheral vascular disease    Past Surgical History  Procedure Laterality Date  . Femoral-tibial bypass graft  09/06/2011    Procedure: BYPASS GRAFT FEMORAL-TIBIAL ARTERY;  Surgeon: Fransisco Hertz, MD;  Location: [facility] OR;  Service: Vascular;  Laterality: Left;  . Fasciotomy  09/06/2011    Procedure: FASCIOTOMY;  Surgeon: Fransisco Hertz, MD;  Location: [facility] OR;  Service: Vascular;  Laterality: Left;  . Pr vein bypass graft,aorto-fem-pop  09/06/11    Left    Family History  Problem Relation Age of Onset  . Diabetes    . Alcohol abuse  Father    History  Substance Use Topics  . Smoking status: Current Every Day Smoker -- 0.50 packs/day for 30 years    Types: Cigarettes  . Smokeless tobacco: Never Used     Comment: pt states that he is trying his best to quit  . Alcohol Use: Yes     Comment: 3 or 4 times a week    Review of Systems  Respiratory: Negative for shortness of breath.   Cardiovascular: Positive for chest pain.  Gastrointestinal: Negative for abdominal pain.  Neurological: Positive for headaches.  All other systems reviewed and are negative.    Allergies  Review of patient's allergies indicates no known allergies.  Home Medications   Current Outpatient Rx  Name  Route  Sig  Dispense  Refill  . alprazolam (XANAX) 2 MG tablet   Oral   Take 2 mg by mouth at bedtime.          . gabapentin (NEURONTIN) 300 MG capsule   Oral   Take 300 mg by mouth 3 (three) times daily.         Marland Kitchen oxyCODONE-acetaminophen (PERCOCET/ROXICET) 5-325 MG per tablet   Oral   Take 1 tablet by mouth every 6 (six) hours as needed for pain.         Marland Kitchen topiramate (TOPAMAX) 100 MG tablet   Oral   Take 200 mg by mouth daily.          BP 103/76  Pulse 72  Temp(Src) 98.6 F (37 C) (Oral)  SpO2 94% Physical Exam  Nursing note and vitals reviewed. Constitutional: He is oriented to person, place, and time. He appears well-developed and well-nourished. No distress.  HENT:  Head: Normocephalic and atraumatic.  Mouth/Throat: Oropharynx is clear and moist.  Eyes: EOM are normal. Pupils are equal, round, and reactive to light.  There is no papilledema on funduscopic exam  Neck: Normal range of motion. Neck supple.  Cardiovascular: Normal rate.   Pulmonary/Chest: Effort normal.  Abdominal: Soft. Bowel sounds are normal.  Musculoskeletal: Normal range of motion.  Neurological: He is alert and oriented to person, place, and time. No cranial nerve deficit. He exhibits normal muscle tone. Coordination normal.  Skin: Skin  is warm and dry. He is not diaphoretic.    ED Course  Procedures (including critical care time) Labs Review Labs Reviewed - No data to display Imaging Review No results found.  MDM  No diagnosis found. Patient presents here with complaints of recurrent migraine headache. He is having these for years and states that this was no different from his prior migraines. His neurological exam is intact and I see no indication for CT or lumbar puncture. He is feeling better with Imitrex given in the ER and wants to go home. He will be discharged with instructions to return if his symptoms worsen or if he develops further problems.    Geoffery Lyons, MD 11/23/12 [phone]

## 2012-11-22 NOTE — ED Notes (Signed)
Pt states migraine began this morning. Seen here last night for same. Denies vomiting at this time. Pt states he only needs a shot of imitrex

## 2012-11-22 NOTE — ED Notes (Signed)
Patient was treated yesterday and today here for migraine. States "they gave me imitrex and it helps but I'm hurting again and I can't afford the medicine." Complaining of returning headache.

## 2012-11-22 NOTE — ED Notes (Signed)
EDP in with pt 

## 2012-11-22 NOTE — ED Provider Notes (Signed)
CSN: [id number]     Arrival date & time 11/22/12  0730 History  This chart was scribed for Brady Hutching, MD by Leone Payor, ED Scribe. This patient was seen in room APA19/APA19 and the patient's care was started 7:42 AM.    Chief Complaint  Patient presents with  . Migraine    The history is provided by the patient. No language interpreter was used.    HPI Comments: Brady Bell is a 57 y.o. male with past medical history of migraines who presents to the Emergency Department complaining of a new episode of migraine that began upon waking this morning. Pt states he has been having 2-3 episodes each day for the past 8 days. States the only thing that provides relief is Imitrex injections. He was seen last night for the same symptoms and received 6mg  subcutaneously yesterday.    Past Medical History  Diagnosis Date  . Migraines   . Hypoglycemia   . Seizures   . Chronic knee pain   . Chronic ankle pain   . Peripheral vascular disease    Past Surgical History  Procedure Laterality Date  . Femoral-tibial bypass graft  09/06/2011    Procedure: BYPASS GRAFT FEMORAL-TIBIAL ARTERY;  Surgeon: Fransisco Hertz, MD;  Location: [facility] OR;  Service: Vascular;  Laterality: Left;  . Fasciotomy  09/06/2011    Procedure: FASCIOTOMY;  Surgeon: Fransisco Hertz, MD;  Location: [facility] OR;  Service: Vascular;  Laterality: Left;  . Pr vein bypass graft,aorto-fem-pop  09/06/11    Left    Family History  Problem Relation Age of Onset  . Diabetes    . Alcohol abuse Father    History  Substance Use Topics  . Smoking status: Current Every Day Smoker -- 0.50 packs/day for 30 years    Types: Cigarettes  . Smokeless tobacco: Never Used     Comment: pt states that he is trying his best to quit  . Alcohol Use: Yes     Comment: 3 or 4 times a week    Review of Systems A complete 10 system review of systems was obtained and all systems are negative except as noted in the HPI and PMH.   Allergies  Review of patient's  allergies indicates no known allergies.  Home Medications   Current Outpatient Rx  Name  Route  Sig  Dispense  Refill  . alprazolam (XANAX) 2 MG tablet   Oral   Take 2 mg by mouth at bedtime.          . gabapentin (NEURONTIN) 300 MG capsule   Oral   Take 300 mg by mouth 3 (three) times daily.         Marland Kitchen oxyCODONE-acetaminophen (PERCOCET/ROXICET) 5-325 MG per tablet   Oral   Take 1 tablet by mouth every 6 (six) hours as needed for pain.         Marland Kitchen topiramate (TOPAMAX) 100 MG tablet   Oral   Take 200 mg by mouth daily.          There were no vitals taken for this visit. Physical Exam  Nursing note and vitals reviewed. Constitutional: He is oriented to person, place, and time. He appears well-developed and well-nourished.  HENT:  Head: Normocephalic and atraumatic.  Eyes: Conjunctivae and EOM are normal. Pupils are equal, round, and reactive to light.  Neck: Normal range of motion.  Pulmonary/Chest: Effort normal.  Musculoskeletal: Normal range of motion.  Neurological: He is alert and oriented to person,  place, and time.  Skin: Skin is warm and dry.  Psychiatric: He has a normal mood and affect.    ED Course  DIAGNOSTIC STUDIES: Oxygen Saturation is 100% on RA, normal by my interpretation.    COORDINATION OF CARE: 7:42 AM Discussed treatment plan with pt at bedside and pt agreed to plan. Will treat with 6mg  Imitrex injection.   Procedures (including critical care time)  Labs Reviewed - No data to display No results found. No diagnosis found.  MDM  History and physical consistent with uncomplicated migraine. Rx Imitrex 6 mg subcutaneous.  I personally performed the services described in this documentation, which was scribed in my presence. The recorded information has been reviewed and is accurate.   Brady Hutching, MD 11/22/12 [phone]

## 2012-11-23 NOTE — ED Notes (Signed)
Patient resting on stretcher with eyes closed when rounding. Patient came out to desk asking for something to drink. Given a cup water. Patient wanting to know when he is going to be discharged.

## 2012-11-25 ENCOUNTER — Emergency Department (HOSPITAL_COMMUNITY)
Admission: EM | Admit: 2012-11-25 | Discharge: 2012-11-25 | Disposition: A | Discharge status: Home / Self Care | Payer: Medicaid Other | Attending: Emergency Medicine | Admitting: Emergency Medicine

## 2012-11-25 ENCOUNTER — Encounter (HOSPITAL_COMMUNITY): Payer: Self-pay

## 2012-11-25 DIAGNOSIS — Z862 Personal history of diseases of the blood and blood-forming organs and certain disorders involving the immune mechanism: Secondary | ICD-10-CM | POA: Insufficient documentation

## 2012-11-25 DIAGNOSIS — Z8639 Personal history of other endocrine, nutritional and metabolic disease: Secondary | ICD-10-CM | POA: Insufficient documentation

## 2012-11-25 DIAGNOSIS — F17200 Nicotine dependence, unspecified, uncomplicated: Secondary | ICD-10-CM | POA: Insufficient documentation

## 2012-11-25 DIAGNOSIS — Z79899 Other long term (current) drug therapy: Secondary | ICD-10-CM | POA: Insufficient documentation

## 2012-11-25 DIAGNOSIS — R110 Nausea: Secondary | ICD-10-CM | POA: Insufficient documentation

## 2012-11-25 DIAGNOSIS — G43909 Migraine, unspecified, not intractable, without status migrainosus: Secondary | ICD-10-CM | POA: Insufficient documentation

## 2012-11-25 DIAGNOSIS — G40909 Epilepsy, unspecified, not intractable, without status epilepticus: Secondary | ICD-10-CM | POA: Insufficient documentation

## 2012-11-25 DIAGNOSIS — G8929 Other chronic pain: Secondary | ICD-10-CM | POA: Insufficient documentation

## 2012-11-25 DIAGNOSIS — Z8679 Personal history of other diseases of the circulatory system: Secondary | ICD-10-CM | POA: Insufficient documentation

## 2012-11-25 MED ORDER — SUMATRIPTAN SUCCINATE 6 MG/0.5ML ~~LOC~~ SOLN
6.0000 mg | Freq: Once | SUBCUTANEOUS | Status: AC
  Administered 2012-11-25: 6 mg via SUBCUTANEOUS
  Filled 2012-11-25: qty 0.5

## 2012-11-25 NOTE — ED Notes (Addendum)
Pt angry with triage questions, "I don't know what the problem is, just give me my imitrex and give it to me now". I explained that the doctor will be with him shortly. "I don't need the doctor just give me the shot now". Refuses to put gown on. Refuses any further triage. Dr. Bebe Shaggy aware.

## 2012-11-25 NOTE — ED Notes (Signed)
Pt still argumentative with this nurse. "I don't know why I have to wait around here for over an hour to see the doctor when all you have to do is look at my chart to see what I need" all attempts by me to explain/conjole pt unsuccessful.

## 2012-11-25 NOTE — ED Notes (Signed)
States he takes up to 3 imitrex injections a day for chronic daily migraines. Tonight woke with usual migraine @ 04:30. No nausea, positive photophobia. Pain right side of head. Pt is out of his imitrex. Does have oxycontin at home but saves that for his joint pain

## 2012-11-25 NOTE — ED Notes (Signed)
Attempted to talk to pt again Re: history, meds etc. Pt's response is "i'm not saying nothing more until I get a shot of imitrex"

## 2012-11-25 NOTE — ED Provider Notes (Signed)
CSN: [id number]     Arrival date & time 11/25/12  0518 History   First MD Initiated Contact with Patient 11/25/12 [phone]     Chief Complaint  Patient presents with  . Migraine   Patient is a 57 y.o. male presenting with headaches. The history is provided by the patient.  Headache Pain location:  Generalized Radiates to:  Does not radiate Onset quality:  Gradual Duration:  1 hour Timing:  Constant Progression:  Worsening Chronicity:  Chronic Similar to prior headaches: yes   Relieved by:  None tried Worsened by:  Light Associated symptoms: nausea   Associated symptoms: no fever, no vomiting and no weakness    Patient presents for what he calls his typical migraine.  He reports it woke him up this morning.  He reports he has this headaches frequently and they usually respond to imitrex, however he has been unable to afford imitrex so he came to the ER.  He has been seen recently for this in the ER and has received imitrex He denies recent head injury.  No new weakness is reported Past Medical History  Diagnosis Date  . Migraines   . Hypoglycemia   . Seizures   . Chronic knee pain   . Chronic ankle pain   . Peripheral vascular disease    Past Surgical History  Procedure Laterality Date  . Femoral-tibial bypass graft  09/06/2011    Procedure: BYPASS GRAFT FEMORAL-TIBIAL ARTERY;  Surgeon: Fransisco Hertz, MD;  Location: [facility] OR;  Service: Vascular;  Laterality: Left;  . Fasciotomy  09/06/2011    Procedure: FASCIOTOMY;  Surgeon: Fransisco Hertz, MD;  Location: [facility] OR;  Service: Vascular;  Laterality: Left;  . Pr vein bypass graft,aorto-fem-pop  09/06/11    Left    Family History  Problem Relation Age of Onset  . Diabetes    . Alcohol abuse Father    History  Substance Use Topics  . Smoking status: Current Every Day Smoker -- 0.50 packs/day for 30 years    Types: Cigarettes  . Smokeless tobacco: Never Used     Comment: pt states that he is trying his best to quit  . Alcohol Use: Yes   Comment: 3 or 4 times a week    Review of Systems  Constitutional: Negative for fever.  Eyes: Negative for visual disturbance.  Gastrointestinal: Positive for nausea. Negative for vomiting.  Neurological: Positive for headaches. Negative for weakness.    Allergies  Review of patient's allergies indicates no known allergies.  Home Medications   Current Outpatient Rx  Name  Route  Sig  Dispense  Refill  . alprazolam (XANAX) 2 MG tablet   Oral   Take 2 mg by mouth at bedtime.          . gabapentin (NEURONTIN) 300 MG capsule   Oral   Take 300 mg by mouth 3 (three) times daily.         Marland Kitchen oxyCODONE-acetaminophen (PERCOCET/ROXICET) 5-325 MG per tablet   Oral   Take 1 tablet by mouth every 6 (six) hours as needed for pain.         Marland Kitchen topiramate (TOPAMAX) 100 MG tablet   Oral   Take 200 mg by mouth daily.          BP 113/75  Pulse 57  Temp(Src) 97 F (36.1 C) (Oral)  Resp 24  Ht 5\' 10"  (1.778 m)  Wt 140 lb (63.504 kg)  BMI 20.09 kg/m2  SpO2 98% Physical  Exam CONSTITUTIONAL: Well developed, anxious, holding his head when I enter the room HEAD: Normocephalic/atraumatic EYES: EOMI/PERRL, no nystagmus ENMT: Mucous membranes moist NECK: supple no meningeal signs CV: S1/S2 noted, no murmurs/rubs/gallops noted LUNGS: Lungs are clear to auscultation bilaterally, no apparent distress ABDOMEN: soft, nontender, no rebound or guarding NEURO:Awake/alert, facies symmetric, no arm or leg drift is noted Gait normal without ataxia EXTREMITIES: pulses normal, full ROM SKIN: warm, color normal PSYCH: anxious   ED Course  Procedures  5:57 AM Pt presents for headache.  He reports this HA is similar to prior headaches he has had "for years" He demands imitrex injection and to be discharged.  He refuses to answer any further questions and states out loud "about never having to negotiate before"  When I informed him I needed to complete my history and physical exam, he states he  usually comes to the ED "and gets the shot and leaves" He has had multiple recent ED visits for similar presentation  and has received imitrex but his headache is returning.  I recommended trying a new agent such as reglan with benadryl, but he refused.  I advised him that imitrex may not be the best option for his headache due to his co-morbidities and he has h/o peripheral vascular disease.  He reports he understands this and still requests imitrex.   MDM  No diagnosis found. Nursing notes including past medical history and social history reviewed and considered in documentation Previous records reviewed and considered     Joya Gaskins, MD 11/25/12 [phone]

## 2012-11-26 ENCOUNTER — Encounter (HOSPITAL_COMMUNITY): Payer: Self-pay | Admitting: Emergency Medicine

## 2012-11-26 ENCOUNTER — Emergency Department (HOSPITAL_COMMUNITY)
Admission: EM | Admit: 2012-11-26 | Discharge: 2012-11-26 | Disposition: A | Discharge status: Home / Self Care | Payer: Medicaid Other | Attending: Emergency Medicine | Admitting: Emergency Medicine

## 2012-11-26 DIAGNOSIS — Z8679 Personal history of other diseases of the circulatory system: Secondary | ICD-10-CM | POA: Insufficient documentation

## 2012-11-26 DIAGNOSIS — Z862 Personal history of diseases of the blood and blood-forming organs and certain disorders involving the immune mechanism: Secondary | ICD-10-CM | POA: Insufficient documentation

## 2012-11-26 DIAGNOSIS — Z79899 Other long term (current) drug therapy: Secondary | ICD-10-CM | POA: Insufficient documentation

## 2012-11-26 DIAGNOSIS — F17200 Nicotine dependence, unspecified, uncomplicated: Secondary | ICD-10-CM | POA: Insufficient documentation

## 2012-11-26 DIAGNOSIS — Z8639 Personal history of other endocrine, nutritional and metabolic disease: Secondary | ICD-10-CM | POA: Insufficient documentation

## 2012-11-26 DIAGNOSIS — G43909 Migraine, unspecified, not intractable, without status migrainosus: Secondary | ICD-10-CM | POA: Insufficient documentation

## 2012-11-26 DIAGNOSIS — G40909 Epilepsy, unspecified, not intractable, without status epilepticus: Secondary | ICD-10-CM | POA: Insufficient documentation

## 2012-11-26 DIAGNOSIS — G8929 Other chronic pain: Secondary | ICD-10-CM | POA: Insufficient documentation

## 2012-11-26 MED ORDER — SUMATRIPTAN SUCCINATE 6 MG/0.5ML ~~LOC~~ SOLN
6.0000 mg | Freq: Once | SUBCUTANEOUS | Status: AC
  Administered 2012-11-26: 6 mg via SUBCUTANEOUS
  Filled 2012-11-26: qty 0.5

## 2012-11-26 MED ORDER — DEXAMETHASONE 4 MG PO TABS
ORAL_TABLET | ORAL | Status: AC
  Filled 2012-11-26: qty 3

## 2012-11-26 MED ORDER — DEXAMETHASONE 6 MG PO TABS
12.0000 mg | ORAL_TABLET | Freq: Once | ORAL | Status: AC
  Administered 2012-11-26: 12 mg via ORAL
  Filled 2012-11-26: qty 2

## 2012-11-26 NOTE — ED Provider Notes (Signed)
CSN: [id number]     Arrival date & time 11/26/12  0243 History   First MD Initiated Contact with Patient 11/26/12 0245     Chief Complaint  Patient presents with  . Headache   (Consider location/radiation/quality/duration/timing/severity/associated sxs/prior Treatment) Patient is a 57 y.o. male presenting with headaches. The history is provided by the patient.  Headache He has a history of migraine headaches and states that he has had over 30 headaches this past month. He was awakened by severe right occipital headache which radiates frontal area pelvis likely sure if it is sharp and throbbing and he rates it at 10/10. Pain is worse with exposure to light and noise. He is not having any nausea currently but he states that if the headache is not treated promptly, he will develop nausea. She usually treats his headaches with sumatriptan and usually gets relief. He had been in the ED yesterday with headache and had done well following treatment until he was awakened with his headache tonight. Of note, he does smoke about one pack of cigarettes a week.  Past Medical History  Diagnosis Date  . Migraines   . Hypoglycemia   . Seizures   . Chronic knee pain   . Chronic ankle pain   . Peripheral vascular disease    Past Surgical History  Procedure Laterality Date  . Femoral-tibial bypass graft  09/06/2011    Procedure: BYPASS GRAFT FEMORAL-TIBIAL ARTERY;  Surgeon: Fransisco Hertz, MD;  Location: [facility] OR;  Service: Vascular;  Laterality: Left;  . Fasciotomy  09/06/2011    Procedure: FASCIOTOMY;  Surgeon: Fransisco Hertz, MD;  Location: [facility] OR;  Service: Vascular;  Laterality: Left;  . Pr vein bypass graft,aorto-fem-pop  09/06/11    Left    Family History  Problem Relation Age of Onset  . Diabetes    . Alcohol abuse Father    History  Substance Use Topics  . Smoking status: Current Every Day Smoker -- 0.50 packs/day for 30 years    Types: Cigarettes  . Smokeless tobacco: Never Used     Comment: pt  states that he is trying his best to quit  . Alcohol Use: Yes     Comment: 3 or 4 times a week    Review of Systems  Neurological: Positive for headaches.  All other systems reviewed and are negative.    Allergies  Review of patient's allergies indicates no known allergies.  Home Medications   Current Outpatient Rx  Name  Route  Sig  Dispense  Refill  . alprazolam (XANAX) 2 MG tablet   Oral   Take 2 mg by mouth at bedtime.          . gabapentin (NEURONTIN) 300 MG capsule   Oral   Take 300 mg by mouth 3 (three) times daily.         Marland Kitchen oxyCODONE-acetaminophen (PERCOCET/ROXICET) 5-325 MG per tablet   Oral   Take 1 tablet by mouth every 6 (six) hours as needed for pain.         Marland Kitchen topiramate (TOPAMAX) 100 MG tablet   Oral   Take 200 mg by mouth daily.          BP 115/55  Pulse 66  Temp(Src) 97.4 F (36.3 C) (Oral)  Resp 17  Ht 5\' 10"  (1.778 m)  Wt 140 lb (63.504 kg)  BMI 20.09 kg/m2  SpO2 98% Physical Exam  Nursing note and vitals reviewed.  57 year old male, who was rocking  back and forth in the bed and moaning in pain, but is in no acute distress. Vital signs are normal. Oxygen saturation is 98%, which is normal. Head is normocephalic and atraumatic. PERRLA, EOMI. Oropharynx is clear. Fundi show no hemorrhage, exudate, or papilledema. Neck is nontender and supple without adenopathy or JVD. Back is nontender and there is no CVA tenderness. Lungs are clear without rales, wheezes, or rhonchi. Chest is nontender. Heart has regular rate and rhythm without murmur. Abdomen is soft, flat, nontender without masses or hepatosplenomegaly and peristalsis is normoactive. Extremities have no cyanosis or edema, full range of motion is present. Skin is warm and dry without rash. Neurologic: Mental status is normal, cranial nerves are intact, there are no motor or sensory deficits.  ED Course  Procedures (including critical care time)   MDM   1. Migraine headache     Recurrent migraine headache. Old records are reviewed and he has multiple ED visits in the last month for migraines including 3 times this past week. He'll be given an injection of sumatriptan. Because of recurrent headaches, he'll also be given a dose of dexamethasone.  Following above noted medication, patient is sleeping and resting comfortably. He is discharged but is advised to stop smoking.  Dione Booze, MD 11/26/12 [phone]

## 2012-11-26 NOTE — ED Notes (Signed)
Pt c/o headache x 10 minutes. Pt was seen for the same last night.

## 2012-11-27 ENCOUNTER — Emergency Department (HOSPITAL_COMMUNITY)
Admission: EM | Admit: 2012-11-27 | Discharge: 2012-11-27 | Disposition: A | Discharge status: Home / Self Care | Payer: Medicaid Other | Attending: Emergency Medicine | Admitting: Emergency Medicine

## 2012-11-27 ENCOUNTER — Encounter (HOSPITAL_COMMUNITY): Payer: Self-pay | Admitting: Emergency Medicine

## 2012-11-27 DIAGNOSIS — Z8679 Personal history of other diseases of the circulatory system: Secondary | ICD-10-CM | POA: Insufficient documentation

## 2012-11-27 DIAGNOSIS — G43909 Migraine, unspecified, not intractable, without status migrainosus: Secondary | ICD-10-CM | POA: Insufficient documentation

## 2012-11-27 DIAGNOSIS — G8929 Other chronic pain: Secondary | ICD-10-CM | POA: Insufficient documentation

## 2012-11-27 DIAGNOSIS — F17200 Nicotine dependence, unspecified, uncomplicated: Secondary | ICD-10-CM | POA: Insufficient documentation

## 2012-11-27 DIAGNOSIS — Z79899 Other long term (current) drug therapy: Secondary | ICD-10-CM | POA: Insufficient documentation

## 2012-11-27 DIAGNOSIS — G40909 Epilepsy, unspecified, not intractable, without status epilepticus: Secondary | ICD-10-CM | POA: Insufficient documentation

## 2012-11-27 DIAGNOSIS — Z862 Personal history of diseases of the blood and blood-forming organs and certain disorders involving the immune mechanism: Secondary | ICD-10-CM | POA: Insufficient documentation

## 2012-11-27 DIAGNOSIS — Z8639 Personal history of other endocrine, nutritional and metabolic disease: Secondary | ICD-10-CM | POA: Insufficient documentation

## 2012-11-27 MED ORDER — SUMATRIPTAN SUCCINATE 6 MG/0.5ML ~~LOC~~ SOLN
6.0000 mg | Freq: Once | SUBCUTANEOUS | Status: AC
  Administered 2012-11-27: 6 mg via SUBCUTANEOUS
  Filled 2012-11-27: qty 0.5

## 2012-11-27 MED ORDER — DEXAMETHASONE SODIUM PHOSPHATE 4 MG/ML IJ SOLN
12.0000 mg | Freq: Once | INTRAMUSCULAR | Status: AC
  Administered 2012-11-27: 12 mg via INTRAMUSCULAR
  Filled 2012-11-27: qty 3

## 2012-11-27 MED ORDER — METOCLOPRAMIDE HCL 5 MG/ML IJ SOLN: 10.0000 mg | Freq: Once | INTRAMUSCULAR | Status: DC

## 2012-11-27 MED ORDER — DIPHENHYDRAMINE HCL 50 MG/ML IJ SOLN: 25.0000 mg | Freq: Once | INTRAMUSCULAR | Status: DC

## 2012-11-27 MED ORDER — DEXAMETHASONE SODIUM PHOSPHATE 4 MG/ML IJ SOLN: 10.0000 mg | Freq: Once | INTRAMUSCULAR | Status: DC

## 2012-11-27 MED ORDER — MAGNESIUM SULFATE 40 MG/ML IJ SOLN: 2.0000 g | Freq: Once | INTRAMUSCULAR | Status: DC

## 2012-11-27 MED ORDER — SODIUM CHLORIDE 0.9 % IV BOLUS (SEPSIS): 1000.0000 mL | Freq: Once | INTRAVENOUS | Status: DC

## 2012-11-27 NOTE — ED Provider Notes (Signed)
CSN: [id number]     Arrival date & time 11/27/12  0408 History   First MD Initiated Contact with Patient 11/27/12 0408     Chief Complaint  Patient presents with  . Migraine   (Consider location/radiation/quality/duration/timing/severity/associated sxs/prior Treatment) HPI History provided by patient. Brought in by EMS for migraine headache onset tonight. Gradual onset feels like a migraine. Patient requesting Imitrex stating this is the only thing that helps. He has had multiple emergency department visits recently for the same. Last night he received a shot of Decadron and is requesting another tonight. Headache is right-sided sharp in quality radiates to right frontal region. No fevers. No neck pain. No weakness or numbness. No trauma.  Past Medical History  Diagnosis Date  . Migraines   . Hypoglycemia   . Seizures   . Chronic knee pain   . Chronic ankle pain   . Peripheral vascular disease    Past Surgical History  Procedure Laterality Date  . Femoral-tibial bypass graft  09/06/2011    Procedure: BYPASS GRAFT FEMORAL-TIBIAL ARTERY;  Surgeon: Fransisco Hertz, MD;  Location: [facility] OR;  Service: Vascular;  Laterality: Left;  . Fasciotomy  09/06/2011    Procedure: FASCIOTOMY;  Surgeon: Fransisco Hertz, MD;  Location: [facility] OR;  Service: Vascular;  Laterality: Left;  . Pr vein bypass graft,aorto-fem-pop  09/06/11    Left    Family History  Problem Relation Age of Onset  . Diabetes    . Alcohol abuse Father    History  Substance Use Topics  . Smoking status: Current Every Day Smoker -- 0.50 packs/day for 30 years    Types: Cigarettes  . Smokeless tobacco: Never Used     Comment: pt states that he is trying his best to quit  . Alcohol Use: Yes     Comment: 3 or 4 times a week    Review of Systems  Constitutional: Negative for fever and chills.  HENT: Negative for neck pain and neck stiffness.   Eyes: Negative for pain.  Respiratory: Negative for shortness of breath.   Cardiovascular:  Negative for chest pain.  Gastrointestinal: Negative for abdominal pain.  Genitourinary: Negative for dysuria.  Musculoskeletal: Negative for back pain.  Skin: Negative for rash.  Neurological: Positive for headaches. Negative for seizures, syncope, speech difficulty, weakness and numbness.  All other systems reviewed and are negative.    Allergies  Review of patient's allergies indicates no known allergies.  Home Medications   Current Outpatient Rx  Name  Route  Sig  Dispense  Refill  . alprazolam (XANAX) 2 MG tablet   Oral   Take 2 mg by mouth at bedtime.          . gabapentin (NEURONTIN) 300 MG capsule   Oral   Take 300 mg by mouth 3 (three) times daily.         Marland Kitchen oxyCODONE-acetaminophen (PERCOCET/ROXICET) 5-325 MG per tablet   Oral   Take 1 tablet by mouth every 6 (six) hours as needed for pain.         Marland Kitchen topiramate (TOPAMAX) 100 MG tablet   Oral   Take 200 mg by mouth daily.          BP 120/90  Ht 5\' 10"  (1.778 m)  Wt 140 lb (63.504 kg)  BMI 20.09 kg/m2  SpO2 94% Physical Exam  Constitutional: He is oriented to person, place, and time. He appears well-developed and well-nourished.  HENT:  Head: Normocephalic and atraumatic.  Eyes:  Conjunctivae and EOM are normal. Pupils are equal, round, and reactive to light.  Neck: Full passive range of motion without pain. Neck supple. No thyromegaly present.  No meningismus  Cardiovascular: Normal rate, regular rhythm, S1 normal, S2 normal and intact distal pulses.   Pulmonary/Chest: Effort normal and breath sounds normal.  Abdominal: Soft. Bowel sounds are normal. There is no tenderness. There is no CVA tenderness.  Musculoskeletal: Normal range of motion.  Neurological: He is alert and oriented to person, place, and time. He has normal strength and normal reflexes. No cranial nerve deficit or sensory deficit. He displays a negative Romberg sign. GCS eye subscore is 4. GCS verbal subscore is 5. GCS motor subscore is  6.  Skin: Skin is warm and dry. No rash noted. No cyanosis. Nails show no clubbing.  Psychiatric: He has a normal mood and affect. His speech is normal and behavior is normal.    ED Course  Procedures (including critical care time)  Imitrex and Decadron  Plan discharge home with outpatient referrals provided Patient states understanding headache precautions and return precautions  MDM  Diagnosis: Headache, history of migraines Medications provided -  Condition improved No neuro deficits or indication for emergent imaging Vital signs, nurse's notes and previous records reviewed      Sunnie Nielsen, MD 11/27/12 [phone]

## 2012-11-27 NOTE — ED Notes (Signed)
Patient given discharge instruction, verbalized understand. Patient ambulatory out of the department.  

## 2012-11-27 NOTE — ED Notes (Signed)
EMS brings pt in, call went out for migraine. States pt ran to door drinking a mountain dew. Pt comes in wheelchair head down moaning.

## 2012-11-28 ENCOUNTER — Emergency Department (HOSPITAL_COMMUNITY)
Admission: EM | Admit: 2012-11-28 | Discharge: 2012-11-28 | Disposition: A | Discharge status: Home / Self Care | Payer: Medicaid Other | Attending: Emergency Medicine | Admitting: Emergency Medicine

## 2012-11-28 ENCOUNTER — Encounter (HOSPITAL_COMMUNITY): Payer: Self-pay | Admitting: *Deleted

## 2012-11-28 DIAGNOSIS — Z79899 Other long term (current) drug therapy: Secondary | ICD-10-CM | POA: Insufficient documentation

## 2012-11-28 DIAGNOSIS — G40909 Epilepsy, unspecified, not intractable, without status epilepticus: Secondary | ICD-10-CM | POA: Insufficient documentation

## 2012-11-28 DIAGNOSIS — G8929 Other chronic pain: Secondary | ICD-10-CM | POA: Insufficient documentation

## 2012-11-28 DIAGNOSIS — F17200 Nicotine dependence, unspecified, uncomplicated: Secondary | ICD-10-CM | POA: Insufficient documentation

## 2012-11-28 DIAGNOSIS — Z862 Personal history of diseases of the blood and blood-forming organs and certain disorders involving the immune mechanism: Secondary | ICD-10-CM | POA: Insufficient documentation

## 2012-11-28 DIAGNOSIS — G43909 Migraine, unspecified, not intractable, without status migrainosus: Secondary | ICD-10-CM | POA: Insufficient documentation

## 2012-11-28 DIAGNOSIS — Z8679 Personal history of other diseases of the circulatory system: Secondary | ICD-10-CM | POA: Insufficient documentation

## 2012-11-28 DIAGNOSIS — Z8639 Personal history of other endocrine, nutritional and metabolic disease: Secondary | ICD-10-CM | POA: Insufficient documentation

## 2012-11-28 MED ORDER — SUMATRIPTAN SUCCINATE 6 MG/0.5ML ~~LOC~~ SOLN
6.0000 mg | Freq: Once | SUBCUTANEOUS | Status: AC
  Administered 2012-11-28: 6 mg via SUBCUTANEOUS
  Filled 2012-11-28: qty 0.5

## 2012-11-28 NOTE — ED Notes (Addendum)
Wioke w/migraine this AM.  Hx of same, has run out of Imitrex injections.  Does not have any other meds to take at home.  States on last visit received Decadron and Imitrex which relieved HA.

## 2012-11-28 NOTE — ED Notes (Signed)
Patient with no complaints at this time. Respirations even and unlabored. Skin warm/dry. Discharge instructions reviewed with patient at this time. Patient given opportunity to voice concerns/ask questions. Patient discharged at this time and left Emergency Department with steady gait.   

## 2012-11-29 ENCOUNTER — Emergency Department (HOSPITAL_COMMUNITY)
Admission: EM | Admit: 2012-11-29 | Discharge: 2012-11-29 | Disposition: A | Discharge status: Home / Self Care | Payer: Medicaid Other | Attending: Emergency Medicine | Admitting: Emergency Medicine

## 2012-11-29 ENCOUNTER — Encounter (HOSPITAL_COMMUNITY): Payer: Self-pay | Admitting: Emergency Medicine

## 2012-11-29 DIAGNOSIS — Z79899 Other long term (current) drug therapy: Secondary | ICD-10-CM | POA: Insufficient documentation

## 2012-11-29 DIAGNOSIS — F17200 Nicotine dependence, unspecified, uncomplicated: Secondary | ICD-10-CM | POA: Insufficient documentation

## 2012-11-29 DIAGNOSIS — Z8679 Personal history of other diseases of the circulatory system: Secondary | ICD-10-CM | POA: Insufficient documentation

## 2012-11-29 DIAGNOSIS — Z8639 Personal history of other endocrine, nutritional and metabolic disease: Secondary | ICD-10-CM | POA: Insufficient documentation

## 2012-11-29 DIAGNOSIS — G40909 Epilepsy, unspecified, not intractable, without status epilepticus: Secondary | ICD-10-CM | POA: Insufficient documentation

## 2012-11-29 DIAGNOSIS — G43909 Migraine, unspecified, not intractable, without status migrainosus: Secondary | ICD-10-CM | POA: Insufficient documentation

## 2012-11-29 DIAGNOSIS — Z951 Presence of aortocoronary bypass graft: Secondary | ICD-10-CM | POA: Insufficient documentation

## 2012-11-29 DIAGNOSIS — Z862 Personal history of diseases of the blood and blood-forming organs and certain disorders involving the immune mechanism: Secondary | ICD-10-CM | POA: Insufficient documentation

## 2012-11-29 DIAGNOSIS — G8929 Other chronic pain: Secondary | ICD-10-CM | POA: Insufficient documentation

## 2012-11-29 MED ORDER — SUMATRIPTAN SUCCINATE 6 MG/0.5ML ~~LOC~~ SOLN
6.0000 mg | Freq: Once | SUBCUTANEOUS | Status: AC
  Administered 2012-11-29: 6 mg via SUBCUTANEOUS
  Filled 2012-11-29: qty 0.5

## 2012-11-29 NOTE — ED Notes (Signed)
Loud moaning stopped immed upon me entering the room with the imitrex. Pt states he's better right after getting injection.

## 2012-11-29 NOTE — ED Provider Notes (Signed)
CSN: [id number]     Arrival date & time 11/29/12  0249 History   First MD Initiated Contact with Patient 11/29/12 0408     Chief Complaint  Patient presents with  . Headache   (Consider location/radiation/quality/duration/timing/severity/associated sxs/prior Treatment) The history is provided by the patient.   57 year old male with history of migraine headaches states that he has not been able to get his prescription for sumatriptan filled since he had a lapse in his insurance and he has been having recurrent headaches for the last month. He has tried over-the-counter medications with no relief. He has had to come to the ED for an injection of sumatriptan. He's had relief of his headache every time he has had an injection of sumatriptan. Headache is right occipital with radiation to the right frontal area and is sharp and throbbing. It is worse with exposure to light and noise. Nothing makes it better. There is no nausea or vomiting tonight. He denies fever or chills.  Past Medical History  Diagnosis Date  . Migraines   . Hypoglycemia   . Seizures   . Chronic knee pain   . Chronic ankle pain   . Peripheral vascular disease    Past Surgical History  Procedure Laterality Date  . Femoral-tibial bypass graft  09/06/2011    Procedure: BYPASS GRAFT FEMORAL-TIBIAL ARTERY;  Surgeon: Fransisco Hertz, MD;  Location: [facility] OR;  Service: Vascular;  Laterality: Left;  . Fasciotomy  09/06/2011    Procedure: FASCIOTOMY;  Surgeon: Fransisco Hertz, MD;  Location: [facility] OR;  Service: Vascular;  Laterality: Left;  . Pr vein bypass graft,aorto-fem-pop  09/06/11    Left    Family History  Problem Relation Age of Onset  . Diabetes    . Alcohol abuse Father    History  Substance Use Topics  . Smoking status: Current Every Day Smoker -- 0.50 packs/day for 30 years    Types: Cigarettes  . Smokeless tobacco: Never Used     Comment: pt states that he is trying his best to quit  . Alcohol Use: Yes     Comment: 3 or 4  times a week    Review of Systems  All other systems reviewed and are negative.    Allergies  Review of patient's allergies indicates no known allergies.  Home Medications   Current Outpatient Rx  Name  Route  Sig  Dispense  Refill  . alprazolam (XANAX) 2 MG tablet   Oral   Take 2 mg by mouth at bedtime.          . gabapentin (NEURONTIN) 300 MG capsule   Oral   Take 300 mg by mouth 3 (three) times daily.         Marland Kitchen oxyCODONE-acetaminophen (PERCOCET/ROXICET) 5-325 MG per tablet   Oral   Take 1 tablet by mouth every 6 (six) hours as needed for pain.         Marland Kitchen topiramate (TOPAMAX) 100 MG tablet   Oral   Take 100 mg by mouth daily.           BP 153/88  Pulse 52  Temp(Src) 97.8 F (36.6 C) (Oral)  Resp 18  Wt 140 lb (63.504 kg)  BMI 20.09 kg/m2  SpO2 96% Physical Exam  Nursing note and vitals reviewed.  57 year old male, who appears uncomfortable, but his in no acute distress. Vital signs are significant for hypertension with blood pressure 153/88. Oxygen saturation is 96%, which is normal. Head is  normocephalic and atraumatic. PERRLA, EOMI. Oropharynx is clear. Neck is nontender and supple without adenopathy or JVD. Back is nontender and there is no CVA tenderness. Lungs are clear without rales, wheezes, or rhonchi. Chest is nontender. Heart has regular rate and rhythm without murmur. Abdomen is soft, flat, nontender without masses or hepatosplenomegaly and peristalsis is normoactive. Extremities have no cyanosis or edema, full range of motion is present. Skin is warm and dry without rash. Neurologic: Mental status is normal, cranial nerves are intact, there are no motor or sensory deficits.  ED Course  Procedures (including critical care time)  MDM   1. Migraine headache    Recurrent migraine headache. Old records are reviewed and he has been seen on a daily basis for the last month for headaches always responding to his sumatriptan injection.  Sumatriptan injection will be repeated today. Of note, he has had several doses of dexamethasone which have not had any demonstrable effect on Recurrent headaches or rebound headaches.  He got good relief of headache with sumatriptan and is discharged.   Dione Booze, MD 11/29/12 [phone]

## 2012-11-29 NOTE — ED Notes (Signed)
Patient has been seen everyday since 11/21/12 for headache.  Patient has not gotten medications filled at pharmacy; gives various reasons why he cannot get prescriptions filled.

## 2012-12-02 ENCOUNTER — Emergency Department (HOSPITAL_COMMUNITY)
Admission: EM | Admit: 2012-12-02 | Discharge: 2012-12-02 | Disposition: A | Discharge status: Home / Self Care | Payer: Medicaid Other | Attending: Emergency Medicine | Admitting: Emergency Medicine

## 2012-12-02 ENCOUNTER — Encounter (HOSPITAL_COMMUNITY): Payer: Self-pay | Admitting: Emergency Medicine

## 2012-12-02 DIAGNOSIS — M2550 Pain in unspecified joint: Secondary | ICD-10-CM | POA: Insufficient documentation

## 2012-12-02 DIAGNOSIS — G43709 Chronic migraine without aura, not intractable, without status migrainosus: Secondary | ICD-10-CM

## 2012-12-02 DIAGNOSIS — Z79899 Other long term (current) drug therapy: Secondary | ICD-10-CM | POA: Insufficient documentation

## 2012-12-02 DIAGNOSIS — Z862 Personal history of diseases of the blood and blood-forming organs and certain disorders involving the immune mechanism: Secondary | ICD-10-CM | POA: Insufficient documentation

## 2012-12-02 DIAGNOSIS — F17200 Nicotine dependence, unspecified, uncomplicated: Secondary | ICD-10-CM | POA: Insufficient documentation

## 2012-12-02 DIAGNOSIS — Z951 Presence of aortocoronary bypass graft: Secondary | ICD-10-CM | POA: Insufficient documentation

## 2012-12-02 DIAGNOSIS — Z8639 Personal history of other endocrine, nutritional and metabolic disease: Secondary | ICD-10-CM | POA: Insufficient documentation

## 2012-12-02 DIAGNOSIS — G8929 Other chronic pain: Secondary | ICD-10-CM | POA: Insufficient documentation

## 2012-12-02 DIAGNOSIS — G40909 Epilepsy, unspecified, not intractable, without status epilepticus: Secondary | ICD-10-CM | POA: Insufficient documentation

## 2012-12-02 DIAGNOSIS — Z8679 Personal history of other diseases of the circulatory system: Secondary | ICD-10-CM | POA: Insufficient documentation

## 2012-12-02 MED ORDER — SUMATRIPTAN SUCCINATE 6 MG/0.5ML ~~LOC~~ SOLN
6.0000 mg | Freq: Once | SUBCUTANEOUS | Status: AC
  Administered 2012-12-02: 6 mg via SUBCUTANEOUS
  Filled 2012-12-02: qty 0.5

## 2012-12-02 NOTE — ED Notes (Signed)
Patient complaining of migraine with onset of approximately 30 minutes ago, history of same. States is out of migraine medication at this time.

## 2012-12-02 NOTE — ED Provider Notes (Signed)
CSN: [id number]     Arrival date & time 11/28/12  8295 History   First MD Initiated Contact with Patient 11/28/12 0845     Chief Complaint  Patient presents with  . Migraine   (Consider location/radiation/quality/duration/timing/severity/associated sxs/prior Treatment) HPI Comments: Patient with hx of recurrent headaches and has multiple ED visits recently for headaches.  He states that he comes to the ED because he recently lost his insurance and cannot afford his prescription Imitrex.  He states the headache n this visit is mostly to the right temple and to the back of his head which is similar to previous headaches.  He denies vomiting, neck pain or stiffness, fever, recent illness , extremity weakness or chest pain  Patient is a 57 y.o. male presenting with migraines. The history is provided by the patient.  Migraine This is a recurrent problem. Episode onset: headache began several hours prior to ED arrival. The problem occurs every several days. The problem has been unchanged. Associated symptoms include headaches. Pertinent negatives include no abdominal pain, change in bowel habit, chest pain, chills, coughing, fever, nausea, neck pain, numbness, rash, sore throat, vertigo, visual change, vomiting or weakness. Exacerbated by: bright light and loud noises.   Treatments tried: otc medications. Improvement on treatment: he states he gets immediate relief with Imitrex.    Past Medical History  Diagnosis Date  . Migraines   . Hypoglycemia   . Seizures   . Chronic knee pain   . Chronic ankle pain   . Peripheral vascular disease    Past Surgical History  Procedure Laterality Date  . Femoral-tibial bypass graft  09/06/2011    Procedure: BYPASS GRAFT FEMORAL-TIBIAL ARTERY;  Surgeon: Fransisco Hertz, MD;  Location: [facility] OR;  Service: Vascular;  Laterality: Left;  . Fasciotomy  09/06/2011    Procedure: FASCIOTOMY;  Surgeon: Fransisco Hertz, MD;  Location: [facility] OR;  Service: Vascular;  Laterality: Left;   . Pr vein bypass graft,aorto-fem-pop  09/06/11    Left    Family History  Problem Relation Age of Onset  . Diabetes    . Alcohol abuse Father    History  Substance Use Topics  . Smoking status: Current Every Day Smoker -- 0.50 packs/day for 30 years    Types: Cigarettes  . Smokeless tobacco: Never Used     Comment: pt states that he is trying his best to quit  . Alcohol Use: Yes     Comment: 3 or 4 times a week    Review of Systems  Constitutional: Negative for fever, chills, activity change and appetite change.  HENT: Negative for sore throat, facial swelling, trouble swallowing, neck pain and neck stiffness.   Eyes: Positive for photophobia. Negative for pain and visual disturbance.  Respiratory: Negative for cough and shortness of breath.   Cardiovascular: Negative for chest pain.  Gastrointestinal: Negative for nausea, vomiting, abdominal pain and change in bowel habit.  Skin: Negative for rash and wound.  Neurological: Positive for headaches. Negative for dizziness, vertigo, syncope, facial asymmetry, speech difficulty, weakness and numbness.  Psychiatric/Behavioral: Negative for confusion and decreased concentration.  All other systems reviewed and are negative.    Allergies  Review of patient's allergies indicates no known allergies.  Home Medications   Current Outpatient Rx  Name  Route  Sig  Dispense  Refill  . alprazolam (XANAX) 2 MG tablet   Oral   Take 2 mg by mouth at bedtime.          Marland Kitchen  gabapentin (NEURONTIN) 300 MG capsule   Oral   Take 300 mg by mouth 3 (three) times daily.         Marland Kitchen oxyCODONE-acetaminophen (PERCOCET/ROXICET) 5-325 MG per tablet   Oral   Take 1 tablet by mouth every 6 (six) hours as needed for pain.         Marland Kitchen topiramate (TOPAMAX) 100 MG tablet   Oral   Take 100 mg by mouth daily.           BP 127/80  Pulse 66  Temp(Src) 97.7 F (36.5 C) (Oral)  Resp 20  Ht 5\' 10"  (1.778 m)  Wt 140 lb (63.504 kg)  BMI 20.09 kg/m2   SpO2 100% Physical Exam  Nursing note and vitals reviewed. Constitutional: He is oriented to person, place, and time. He appears well-developed and well-nourished. No distress.  HENT:  Head: Normocephalic and atraumatic.  Mouth/Throat: Oropharynx is clear and moist.  Eyes: EOM are normal. Pupils are equal, round, and reactive to light.  Neck: Normal range of motion and phonation normal. Neck supple. No spinous process tenderness and no muscular tenderness present. No rigidity. No Brudzinski's sign and no Kernig's sign noted.  Cardiovascular: Normal rate, regular rhythm, normal heart sounds and intact distal pulses.   No murmur heard. Pulmonary/Chest: Effort normal and breath sounds normal. No respiratory distress.  Musculoskeletal: Normal range of motion.  Neurological: He is alert and oriented to person, place, and time. He has normal strength. No cranial nerve deficit or sensory deficit. He exhibits normal muscle tone. Coordination and gait normal. GCS eye subscore is 4. GCS verbal subscore is 5. GCS motor subscore is 6.  Reflex Scores:      Tricep reflexes are 2+ on the right side and 2+ on the left side.      Bicep reflexes are 2+ on the right side and 2+ on the left side. Skin: Skin is warm and dry.  Psychiatric: He has a normal mood and affect.    ED Course  Procedures (including critical care time) Labs Review Labs Reviewed - No data to display Imaging Review No results found.  MDM   1. Headache     Previous ED visits reviewed.  Pt has multiple visits this month for same. Has headache diary with him with multiple times and dates written that he indicates are times that his headaches occur.    Patient is feeling better after Imitrex injection   Vitals stable,  Pt is non-toxic appearing.  No focal neuro deficits, no meningeal signs.  Headache of gradual onset that is similar to previous.  Patient agrees to close f/u with PMD or to return here if the symptoms worsen.      Arlon Bleier L. Trisha Mangle, PA-C 12/02/12 2223

## 2012-12-03 ENCOUNTER — Emergency Department (HOSPITAL_COMMUNITY)
Admission: EM | Admit: 2012-12-03 | Discharge: 2012-12-03 | Discharge status: Against Medical Advice | Payer: Medicaid Other | Attending: Emergency Medicine | Admitting: Emergency Medicine

## 2012-12-03 ENCOUNTER — Emergency Department (HOSPITAL_COMMUNITY): Payer: Medicaid Other

## 2012-12-03 ENCOUNTER — Encounter (HOSPITAL_COMMUNITY): Payer: Self-pay | Admitting: *Deleted

## 2012-12-03 DIAGNOSIS — Z8679 Personal history of other diseases of the circulatory system: Secondary | ICD-10-CM | POA: Insufficient documentation

## 2012-12-03 DIAGNOSIS — F17200 Nicotine dependence, unspecified, uncomplicated: Secondary | ICD-10-CM | POA: Insufficient documentation

## 2012-12-03 DIAGNOSIS — G43909 Migraine, unspecified, not intractable, without status migrainosus: Secondary | ICD-10-CM | POA: Insufficient documentation

## 2012-12-03 DIAGNOSIS — Z8639 Personal history of other endocrine, nutritional and metabolic disease: Secondary | ICD-10-CM | POA: Insufficient documentation

## 2012-12-03 DIAGNOSIS — M25579 Pain in unspecified ankle and joints of unspecified foot: Secondary | ICD-10-CM | POA: Insufficient documentation

## 2012-12-03 DIAGNOSIS — G40909 Epilepsy, unspecified, not intractable, without status epilepticus: Secondary | ICD-10-CM | POA: Insufficient documentation

## 2012-12-03 DIAGNOSIS — Z862 Personal history of diseases of the blood and blood-forming organs and certain disorders involving the immune mechanism: Secondary | ICD-10-CM | POA: Insufficient documentation

## 2012-12-03 DIAGNOSIS — M25569 Pain in unspecified knee: Secondary | ICD-10-CM | POA: Insufficient documentation

## 2012-12-03 DIAGNOSIS — Z79899 Other long term (current) drug therapy: Secondary | ICD-10-CM | POA: Insufficient documentation

## 2012-12-03 DIAGNOSIS — G8929 Other chronic pain: Secondary | ICD-10-CM | POA: Insufficient documentation

## 2012-12-03 MED ORDER — SUMATRIPTAN SUCCINATE 6 MG/0.5ML ~~LOC~~ SOLN
6.0000 mg | Freq: Once | SUBCUTANEOUS | Status: AC
  Administered 2012-12-03: 6 mg via SUBCUTANEOUS
  Filled 2012-12-03: qty 0.5

## 2012-12-03 NOTE — ED Provider Notes (Signed)
CSN: [id number]     Arrival date & time 12/03/12  2040 History  This chart was scribed for Glynn Octave, MD by Carl Best, ED Scribe. This patient was seen in room APA01/APA01 and the patient's care was started at 9:14 PM.    Chief Complaint  Patient presents with  . Headache    The history is provided by the patient. No language interpreter was used.   HPI Comments: Brady SHAMOON is a 56 y.o. male with a history of chronic migraines who presents to the Emergency Department by EMS complaining of a migraine located in the occipital region.  Patient states that the pain came on gradually and has gotten worse with time.  He reports pain has worsened with movement of his neck and he lists photophobia and nausea as associated symptoms.  He states that the last severe migraine he experienced happened a couple of days ago.  Patient denies neck pain, emesis and fever.  He states that Imitrex and Relpex improve his migraines but is not currently taking them due to financial constrictions. Patient has a history of PVD.     Past Medical History  Diagnosis Date  . Migraines   . Hypoglycemia   . Seizures   . Chronic knee pain   . Chronic ankle pain   . Peripheral vascular disease    Past Surgical History  Procedure Laterality Date  . Femoral-tibial bypass graft  09/06/2011    Procedure: BYPASS GRAFT FEMORAL-TIBIAL ARTERY;  Surgeon: Fransisco Hertz, MD;  Location: [facility] OR;  Service: Vascular;  Laterality: Left;  . Fasciotomy  09/06/2011    Procedure: FASCIOTOMY;  Surgeon: Fransisco Hertz, MD;  Location: [facility] OR;  Service: Vascular;  Laterality: Left;  . Pr vein bypass graft,aorto-fem-pop  09/06/11    Left    Family History  Problem Relation Age of Onset  . Diabetes    . Alcohol abuse Father    History  Substance Use Topics  . Smoking status: Current Every Day Smoker -- 0.50 packs/day for 30 years    Types: Cigarettes  . Smokeless tobacco: Never Used     Comment: pt states that he is trying his best  to quit  . Alcohol Use: Yes     Comment: 3 or 4 times a week    Review of Systems A complete 10 system review of systems was obtained and all systems are negative except as noted in the HPI and PMH.   Allergies  Review of patient's allergies indicates no known allergies.  Home Medications   Current Outpatient Rx  Name  Route  Sig  Dispense  Refill  . alprazolam (XANAX) 2 MG tablet   Oral   Take 2 mg by mouth at bedtime.          . gabapentin (NEURONTIN) 300 MG capsule   Oral   Take 300 mg by mouth 3 (three) times daily.         Marland Kitchen oxyCODONE-acetaminophen (PERCOCET/ROXICET) 5-325 MG per tablet   Oral   Take 1 tablet by mouth every 6 (six) hours as needed for pain.         Marland Kitchen topiramate (TOPAMAX) 100 MG tablet   Oral   Take 100 mg by mouth daily.           Triage Vitals: BP 150/87  Pulse 62  Temp(Src) 97.7 F (36.5 C) (Oral)  Resp 18  Ht 5\' 10"  (1.778 m)  Wt 140 lb (63.504 kg)  BMI  20.09 kg/m2  SpO2 99% Physical Exam  Nursing note and vitals reviewed. Constitutional: He is oriented to person, place, and time.  Holding head with dramatic moaning.    HENT:  Head: Normocephalic and atraumatic.  Right Ear: External ear normal.  Left Ear: External ear normal.  Eyes: Conjunctivae and EOM are normal. Pupils are equal, round, and reactive to light.  Neck: Normal range of motion and phonation normal. Neck supple.  No meningismus   Cardiovascular: Normal rate, regular rhythm, normal heart sounds and intact distal pulses.   Pulmonary/Chest: Effort normal and breath sounds normal. He exhibits no bony tenderness.  Abdominal: Soft. Normal appearance. There is no tenderness.  Musculoskeletal: Normal range of motion.  Neurological: He is alert and oriented to person, place, and time. He has normal strength. No cranial nerve deficit or sensory deficit. He exhibits normal muscle tone. Coordination normal.  CN 2-12 intact, no ataxia on finger to nose, no nystagmus, 5/5  strength throughout, no pronator drift, Romberg negative, normal gait.   Skin: Skin is warm, dry and intact.  Psychiatric: He has a normal mood and affect. His behavior is normal. Judgment and thought content normal.    ED Course  Procedures (including critical care time)  DIAGNOSTIC STUDIES: Oxygen Saturation is 99% on room air, normal by my interpretation.    COORDINATION OF CARE: 11:51 PM-  Labs Review Labs Reviewed - No data to display Imaging Review No results found.  MDM   1. Headache    Typical migraine headache with similar presentations for same. Nonfocal neurological exam. Patient denies thunderclap onset.  Patient essentially demands Imitrex stating that it is the only thing that works. He understands this is not preferred given his PVD history. Headache improved with imitrex.  Patient refuses CT imaging.  Informed that the ED is not an appropriate venue for his recurrent headaches.  I personally performed the services described in this documentation, which was scribed in my presence. The recorded information has been reviewed and is accurate.   Glynn Octave, MD 12/03/12 2351

## 2012-12-03 NOTE — ED Notes (Addendum)
Headache for 30 min, out of meds  Moaning loudly at triage

## 2012-12-03 NOTE — ED Notes (Signed)
EDP discharging pt AMA due to him refusing a head ct.

## 2012-12-03 NOTE — ED Notes (Signed)
Pt refused head ct and radiology called and asked me to discontinue order so it would be out of their system.

## 2012-12-03 NOTE — ED Provider Notes (Signed)
Medical screening examination/treatment/procedure(s) were performed by non-physician practitioner and as supervising physician I was immediately available for consultation/collaboration.   Benny Lennert, MD 12/03/12 [phone]

## 2012-12-03 NOTE — ED Notes (Signed)
Told by Brady Bell, NT that pt was refusing head ct.

## 2012-12-03 NOTE — ED Notes (Addendum)
Pt c/o headache.  Has been seen 10 times in past 2 weeks for same complaint.  Pt demanding pain medications.  Reinforced with pt that physician will see him as soon as possible, but since he is medically stable at this time he will not be the very next person seen.  Pt angry and demanding a physician be called in.  Explained to pt that there are two doctors in the department and they are seeing patient as quickly as possible based on medical acuity.

## 2012-12-06 ENCOUNTER — Other Ambulatory Visit: Payer: Self-pay | Admitting: Vascular Surgery

## 2012-12-06 NOTE — ED Provider Notes (Signed)
CSN: [id number]     Arrival date & time 12/02/12  1478 History   First MD Initiated Contact with Patient 12/02/12 [phone]     Chief Complaint  Patient presents with  . Migraine   (Consider location/radiation/quality/duration/timing/severity/associated sxs/prior Treatment) Patient is a 57 y.o. male presenting with migraines. The history is provided by the patient.  Migraine This is a chronic problem. The current episode started today. The problem occurs constantly. The problem has been gradually worsening. Associated symptoms include arthralgias and headaches. Pertinent negatives include no abdominal pain, chills, congestion, myalgias, nausea or vomiting. Nothing aggravates the symptoms. He has tried nothing (Pt states imitres help but does not have the shots and cannot afford the PCP cost) for the symptoms. The treatment provided no relief.    Past Medical History  Diagnosis Date  . Migraines   . Hypoglycemia   . Seizures   . Chronic knee pain   . Chronic ankle pain   . Peripheral vascular disease    Past Surgical History  Procedure Laterality Date  . Femoral-tibial bypass graft  09/06/2011    Procedure: BYPASS GRAFT FEMORAL-TIBIAL ARTERY;  Surgeon: Fransisco Hertz, MD;  Location: [facility] OR;  Service: Vascular;  Laterality: Left;  . Fasciotomy  09/06/2011    Procedure: FASCIOTOMY;  Surgeon: Fransisco Hertz, MD;  Location: [facility] OR;  Service: Vascular;  Laterality: Left;  . Pr vein bypass graft,aorto-fem-pop  09/06/11    Left    Family History  Problem Relation Age of Onset  . Diabetes    . Alcohol abuse Father    History  Substance Use Topics  . Smoking status: Current Every Day Smoker -- 0.50 packs/day for 30 years    Types: Cigarettes  . Smokeless tobacco: Never Used     Comment: pt states that he is trying his best to quit  . Alcohol Use: Yes     Comment: 3 or 4 times a week    Review of Systems  Constitutional: Negative for chills.  HENT: Negative for congestion.   Gastrointestinal:  Negative for nausea, vomiting and abdominal pain.  Musculoskeletal: Positive for arthralgias. Negative for myalgias.  Neurological: Positive for seizures and headaches.    Allergies  Review of patient's allergies indicates no known allergies.  Home Medications   Current Outpatient Rx  Name  Route  Sig  Dispense  Refill  . alprazolam (XANAX) 2 MG tablet   Oral   Take 2 mg by mouth at bedtime.          . gabapentin (NEURONTIN) 300 MG capsule   Oral   Take 300 mg by mouth 3 (three) times daily.         Marland Kitchen oxyCODONE-acetaminophen (PERCOCET/ROXICET) 5-325 MG per tablet   Oral   Take 1 tablet by mouth every 6 (six) hours as needed for pain.         Marland Kitchen topiramate (TOPAMAX) 100 MG tablet   Oral   Take 100 mg by mouth daily.           BP 106/68  Pulse 65  Temp(Src) 97.3 F (36.3 C) (Axillary)  Resp 20  Ht 5\' 10"  (1.778 m)  Wt 140 lb (63.504 kg)  BMI 20.09 kg/m2  SpO2 100% Physical Exam  Nursing note and vitals reviewed. Constitutional: He is oriented to person, place, and time. He appears well-developed and well-nourished.  Non-toxic appearance.  HENT:  Head: Normocephalic.  Right Ear: Tympanic membrane and external ear normal.  Left Ear: Tympanic membrane  and external ear normal.  Eyes: EOM and lids are normal. Pupils are equal, round, and reactive to light.  Neck: Normal range of motion. Neck supple. Carotid bruit is not present.  Cardiovascular: Normal rate, regular rhythm, normal heart sounds, intact distal pulses and normal pulses.   Pulmonary/Chest: Breath sounds normal. No respiratory distress.  Abdominal: Soft. Bowel sounds are normal. There is no tenderness. There is no guarding.  Musculoskeletal: Normal range of motion.  Lymphadenopathy:       Head (right side): No submandibular adenopathy present.       Head (left side): No submandibular adenopathy present.    He has no cervical adenopathy.  Neurological: He is alert and oriented to person, place, and  time. He has normal strength. No cranial nerve deficit or sensory deficit. He exhibits normal muscle tone. Coordination normal.  Skin: Skin is warm and dry.  Psychiatric: He has a normal mood and affect. His speech is normal.    ED Course  Procedures (including critical care time) Labs Review Labs Reviewed - No data to display Imaging Review No results found.  MDM   1. Headache, chronic migraine without aura    **I have reviewed nursing notes, vital signs, and all appropriate lab and imaging results for this patient.*  Pt treated with imitrex. Pt advised to establish a primary MD or th follow up with the Adult med clinic in [place]. No abnormal reaction to the imitrex injection noted. No neuro changes on repeat exam.  Kathie Dike, PA-C 12/06/12 1610

## 2012-12-07 NOTE — ED Provider Notes (Signed)
Medical screening examination/treatment/procedure(s) were performed by non-physician practitioner and as supervising physician I was immediately available for consultation/collaboration.   Laray Anger, DO 12/07/12 2216

## 2012-12-08 LAB — LIPID PANEL
LDL Cholesterol: 80 mg/dL (ref 0–99)
VLDL: 21 mg/dL (ref 0–40)

## 2012-12-09 ENCOUNTER — Encounter: Payer: Self-pay | Admitting: Family

## 2012-12-10 ENCOUNTER — Ambulatory Visit (INDEPENDENT_AMBULATORY_CARE_PROVIDER_SITE_OTHER): Payer: Medicaid Other | Admitting: Family

## 2012-12-10 ENCOUNTER — Ambulatory Visit: Payer: Self-pay | Admitting: Family

## 2012-12-10 ENCOUNTER — Encounter (INDEPENDENT_AMBULATORY_CARE_PROVIDER_SITE_OTHER): Payer: Medicaid Other | Admitting: *Deleted

## 2012-12-10 ENCOUNTER — Encounter: Payer: Self-pay | Admitting: Family

## 2012-12-10 VITALS — BP 102/73 | HR 55 | Resp 14 | Ht 70.0 in | Wt 140.0 lb

## 2012-12-10 DIAGNOSIS — Z48812 Encounter for surgical aftercare following surgery on the circulatory system: Secondary | ICD-10-CM

## 2012-12-10 DIAGNOSIS — I739 Peripheral vascular disease, unspecified: Secondary | ICD-10-CM

## 2012-12-10 DIAGNOSIS — M79609 Pain in unspecified limb: Secondary | ICD-10-CM

## 2012-12-10 NOTE — Patient Instructions (Addendum)
Peripheral Vascular Disease Peripheral Vascular Disease (PVD), also called Peripheral Arterial Disease (PAD), is a circulation problem caused by cholesterol (atherosclerotic plaque) deposits in the arteries. PVD commonly occurs in the lower extremities (legs) but it can occur in other areas of the body, such as your arms. The cholesterol buildup in the arteries reduces blood flow which can cause pain and other serious problems. The presence of PVD can place a person at risk for Coronary Artery Disease (CAD).  CAUSES  Causes of PVD can be many. It is usually associated with more than one risk factor such as:   High Cholesterol.  Smoking.  Diabetes.  Lack of exercise or inactivity.  High blood pressure (hypertension).  Obesity.  Family history. SYMPTOMS   When the lower extremities are affected, patients with PVD may experience:  Leg pain with exertion or physical activity. This is called INTERMITTENT CLAUDICATION. This may present as cramping or numbness with physical activity. The location of the pain is associated with the level of blockage. For example, blockage at the abdominal level (distal abdominal aorta) may result in buttock or hip pain. Lower leg arterial blockage may result in calf pain.  As PVD becomes more severe, pain can develop with less physical activity.  In people with severe PVD, leg pain may occur at rest.  Other PVD signs and symptoms:  Leg numbness or weakness.  Coldness in the affected leg or foot, especially when compared to the other leg.  A change in leg color.  Patients with significant PVD are more prone to ulcers or sores on toes, feet or legs. These may take longer to heal or may reoccur. The ulcers or sores can become infected.  If signs and symptoms of PVD are ignored, gangrene may occur. This can result in the loss of toes or loss of an entire limb.  Not all leg pain is related to PVD. Other medical conditions can cause leg pain such  as:  Blood clots (embolism) or Deep Vein Thrombosis.  Inflammation of the blood vessels (vasculitis).  Spinal stenosis. DIAGNOSIS  Diagnosis of PVD can involve several different types of tests. These can include:  Pulse Volume Recording Method (PVR). This test is simple, painless and does not involve the use of X-rays. PVR involves measuring and comparing the blood pressure in the arms and legs. An ABI (Ankle-Brachial Index) is calculated. The normal ratio of blood pressures is 1. As this number becomes smaller, it indicates more severe disease.  < 0.95  indicates significant narrowing in one or more leg vessels.  <0.8 there will usually be pain in the foot, leg or buttock with exercise.  <0.4 will usually have pain in the legs at rest.  <0.25  usually indicates limb threatening PVD.  Doppler detection of pulses in the legs. This test is painless and checks to see if you have a pulses in your legs/feet.  A dye or contrast material (a substance that highlights the blood vessels so they show up on x-ray) may be given to help your caregiver better see the arteries for the following tests. The dye is eliminated from your body by the kidney's. Your caregiver may order blood work to check your kidney function and other laboratory values before the following tests are performed:  Magnetic Resonance Angiography (MRA). An MRA is a picture study of the blood vessels and arteries. The MRA machine uses a large magnet to produce images of the blood vessels.  Computed Tomography Angiography (CTA). A CTA is a   specialized x-ray that looks at how the blood flows in your blood vessels. An IV may be inserted into your arm so contrast dye can be injected.  Angiogram. Is a procedure that uses x-rays to look at your blood vessels. This procedure is minimally invasive, meaning a small incision (cut) is made in your groin. A small tube (catheter) is then inserted into the artery of your groin. The catheter is  guided to the blood vessel or artery your caregiver wants to examine. Contrast dye is injected into the catheter. X-rays are then taken of the blood vessel or artery. After the images are obtained, the catheter is taken out. TREATMENT  Treatment of PVD involves many interventions which may include:  Lifestyle changes:  Quitting smoking.  Exercise.  Following a low fat, low cholesterol diet.  Control of diabetes.  Foot care is very important to the PVD patient. Good foot care can help prevent infection.  Medication:  Cholesterol-lowering medicine.  Blood pressure medicine.  Anti-platelet drugs.  Certain medicines may reduce symptoms of Intermittent Claudication.  Interventional/Surgical options:  Angioplasty. An Angioplasty is a procedure that inflates a balloon in the blocked artery. This opens the blocked artery to improve blood flow.  Stent Implant. A wire mesh tube (stent) is placed in the artery. The stent expands and stays in place, allowing the artery to remain open.  Peripheral Bypass Surgery. This is a surgical procedure that reroutes the blood around a blocked artery to help improve blood flow. This type of procedure may be performed if Angioplasty or stent implants are not an option. SEEK IMMEDIATE MEDICAL CARE IF:   You develop pain or numbness in your arms or legs.  Your arm or leg turns cold, becomes blue in color.  You develop redness, warmth, swelling and pain in your arms or legs. MAKE SURE YOU:   Understand these instructions.  Will watch your condition.  Will get help right away if you are not doing well or get worse. Document Released: 04/24/2004 Document Revised: 06/09/2011 Document Reviewed: 03/21/2008 ExitCare Patient Information 2014 [employer].   Smoking Cessation Quitting smoking is important to your health and has many advantages. However, it is not always easy to quit since nicotine is a very addictive drug. Often times, people try 3  times or more before being able to quit. This document explains the best ways for you to prepare to quit smoking. Quitting takes hard work and a lot of effort, but you can do it. ADVANTAGES OF QUITTING SMOKING  You will live longer, feel better, and live better.  Your body will feel the impact of quitting smoking almost immediately.  Within 20 minutes, blood pressure decreases. Your pulse returns to its normal level.  After 8 hours, carbon monoxide levels in the blood return to normal. Your oxygen level increases.  After 24 hours, the chance of having a heart attack starts to decrease. Your breath, hair, and body stop smelling like smoke.  After 48 hours, damaged nerve endings begin to recover. Your sense of taste and smell improve.  After 72 hours, the body is virtually free of nicotine. Your bronchial tubes relax and breathing becomes easier.  After 2 to 12 weeks, lungs can hold more air. Exercise becomes easier and circulation improves.  The risk of having a heart attack, stroke, cancer, or lung disease is greatly reduced.  After 1 year, the risk of coronary heart disease is cut in half.  After 5 years, the risk of stroke falls to   the same as a nonsmoker.  After 10 years, the risk of lung cancer is cut in half and the risk of other cancers decreases significantly.  After 15 years, the risk of coronary heart disease drops, usually to the level of a nonsmoker.  If you are pregnant, quitting smoking will improve your chances of having a healthy baby.  The people you live with, especially any children, will be healthier.  You will have extra money to spend on things other than cigarettes. QUESTIONS TO THINK ABOUT BEFORE ATTEMPTING TO QUIT You may want to talk about your answers with your caregiver.  Why do you want to quit?  If you tried to quit in the past, what helped and what did not?  What will be the most difficult situations for you after you quit? How will you plan to  handle them?  Who can help you through the tough times? Your family? Friends? A caregiver?  What pleasures do you get from smoking? What ways can you still get pleasure if you quit? Here are some questions to ask your caregiver:  How can you help me to be successful at quitting?  What medicine do you think would be best for me and how should I take it?  What should I do if I need more help?  What is smoking withdrawal like? How can I get information on withdrawal? GET READY  Set a quit date.  Change your environment by getting rid of all cigarettes, ashtrays, matches, and lighters in your home, car, or work. Do not let people smoke in your home.  Review your past attempts to quit. Think about what worked and what did not. GET SUPPORT AND ENCOURAGEMENT You have a better chance of being successful if you have help. You can get support in many ways.  Tell your family, friends, and co-workers that you are going to quit and need their support. Ask them not to smoke around you.  Get individual, group, or telephone counseling and support. Programs are available at local hospitals and health centers. Call your local health department for information about programs in your area.  Spiritual beliefs and practices may help some smokers quit.  Download a "quit meter" on your computer to keep track of quit statistics, such as how long you have gone without smoking, cigarettes not smoked, and money saved.  Get a self-help book about quitting smoking and staying off of tobacco. LEARN NEW SKILLS AND BEHAVIORS  Distract yourself from urges to smoke. Talk to someone, go for a walk, or occupy your time with a task.  Change your normal routine. Take a different route to work. Drink tea instead of coffee. Eat breakfast in a different place.  Reduce your stress. Take a hot bath, exercise, or read a book.  Plan something enjoyable to do every day. Reward yourself for not smoking.  Explore  interactive web-based programs that specialize in helping you quit. GET MEDICINE AND USE IT CORRECTLY Medicines can help you stop smoking and decrease the urge to smoke. Combining medicine with the above behavioral methods and support can greatly increase your chances of successfully quitting smoking.  Nicotine replacement therapy helps deliver nicotine to your body without the negative effects and risks of smoking. Nicotine replacement therapy includes nicotine gum, lozenges, inhalers, nasal sprays, and skin patches. Some may be available over-the-counter and others require a prescription.  Antidepressant medicine helps people abstain from smoking, but how this works is unknown. This medicine is available by prescription.    Nicotinic receptor partial agonist medicine simulates the effect of nicotine in your brain. This medicine is available by prescription. Ask your caregiver for advice about which medicines to use and how to use them based on your health history. Your caregiver will tell you what side effects to look out for if you choose to be on a medicine or therapy. Carefully read the information on the package. Do not use any other product containing nicotine while using a nicotine replacement product.  RELAPSE OR DIFFICULT SITUATIONS Most relapses occur within the first 3 months after quitting. Do not be discouraged if you start smoking again. Remember, most people try several times before finally quitting. You may have symptoms of withdrawal because your body is used to nicotine. You may crave cigarettes, be irritable, feel very hungry, cough often, get headaches, or have difficulty concentrating. The withdrawal symptoms are only temporary. They are strongest when you first quit, but they will go away within 10 14 days. To reduce the chances of relapse, try to:  Avoid drinking alcohol. Drinking lowers your chances of successfully quitting.  Reduce the amount of caffeine you consume. Once you  quit smoking, the amount of caffeine in your body increases and can give you symptoms, such as a rapid heartbeat, sweating, and anxiety.  Avoid smokers because they can make you want to smoke.  Do not let weight gain distract you. Many smokers will gain weight when they quit, usually less than 10 pounds. Eat a healthy diet and stay active. You can always lose the weight gained after you quit.  Find ways to improve your mood other than smoking. FOR MORE INFORMATION  www.smokefree.gov  Document Released: 03/11/2001 Document Revised: 09/16/2011 Document Reviewed: 06/26/2011 [employer] Patient Information 2014 [employer].  

## 2012-12-10 NOTE — Progress Notes (Signed)
[facility] HISTORY AND PHYSICAL -PAD  History of Present Illness Brady Bell is a 57 y.o. male who presents for follow up. Previous operation(s) include: Left common femoral artery to anterior tibial artery bypass with non-reversed ipslateral greater saphenous vein  (Date: 09/06/11), Dr. Imogene Burn.  Larey Seat a couple of years ago, pt. states MRI of left leg demonstrates need for left knee arthroscope, from patient's description, sounds like medial meniscus repair; he is wearing brace on left knee.  Pt. denies claudication in legs, however he is not able to walk further than a block without his legs "giving out", left leg feels weaker than right. Pt. denies rest pain; denies night pain denies non healing ulcers on lower extremities.  reports New Medical or Surgical History: increased migraine headaches frequency last month   Pt Diabetic: No Pt smoker: smoker  (3-4 cigarettes/day x 30 yrs), trying to quit, describes several psychosocial barriers  Pt meds include: Statin :No Betablocker: No ASA: Yes Other anticoagulants/antiplatelets: none  Past Medical History  Diagnosis Date  . Migraines   . Hypoglycemia   . Seizures   . Chronic knee pain   . Chronic ankle pain   . Peripheral vascular disease     Social History History  Substance Use Topics  . Smoking status: Current Every Day Smoker -- 0.50 packs/day for 30 years    Types: Cigarettes  . Smokeless tobacco: Never Used     Comment: pt states that he is trying his best to quit  . Alcohol Use: Yes     Comment: 3 or 4 times a week    Family History Family History  Problem Relation Age of Onset  . Diabetes    . Alcohol abuse Father     Past Surgical History  Procedure Laterality Date  . Femoral-tibial bypass graft  09/06/2011    Procedure: BYPASS GRAFT FEMORAL-TIBIAL ARTERY;  Surgeon: Fransisco Hertz, MD;  Location: [facility];  Service: Vascular;  Laterality: Left;  . Fasciotomy  09/06/2011    Procedure:  FASCIOTOMY;  Surgeon: Fransisco Hertz, MD;  Location: [facility] OR;  Service: Vascular;  Laterality: Left;  . Pr vein bypass graft,aorto-fem-pop  09/06/11    Left     No Known Allergies  Current Outpatient Prescriptions  Medication Sig Dispense Refill  . alprazolam (XANAX) 2 MG tablet Take 2 mg by mouth at bedtime.       . gabapentin (NEURONTIN) 300 MG capsule Take 300 mg by mouth 3 (three) times daily.      Marland Kitchen oxyCODONE-acetaminophen (PERCOCET/ROXICET) 5-325 MG per tablet Take 1 tablet by mouth every 6 (six) hours as needed for pain.      Marland Kitchen topiramate (TOPAMAX) 100 MG tablet Take 100 mg by mouth daily.        No current facility-administered medications for this visit.    ROS: [x]  Positive   [ ]  Denies  General:[ ]  Weight loss,  [ ]  Weight gain, [ ]  Fever, [ ]  chills Neurologic: [ ]  Dizziness, [ ]  Blackouts, [ ]  Seizure [ ]  Stroke, [ ]  "Mini stroke", [ ]  Slurred speech, [ ]  Temporary blindness;  [ ] weakness, [ ]  Hoarseness Cardiac: [ ]  Chest pain/pressure, [ ]  Shortness of breath at rest [ ]  Shortness of breath with exertion,  [ ]   Atrial fibrillation or irregular heartbeat Vascular:[ X] Pain in legs with walking, [ ]  Pain in legs at rest ,[ ]  Pain in legs at night,  [ ]   Non-healing  ulcer, [ ]  Blood clot in vein/DVT,   Pulmonary: [ ]  Home oxygen, [ ]   Productive cough, [ ]  Coughing up blood,  [ ]  Asthma,  [ ]  Wheezing Musculoskeletal:  [ ]  Arthritis, [ ]  Low back pain,  [ ]  Joint pain Hematologic:[ ]  Easy Bruising, [ ]  Anemia; [ ]  Hepatitis Gastrointestinal: [ ]  Blood in stool,  [ ]  Gastroesophageal Reflux, [ ]  Trouble swallowing Urinary: [ ]  chronic Kidney disease, [ ]  on HD, [ ]  Burning with urination, [ ]  Frequent urination, [ ]  Difficulty urinating;  Skin: [ ]  Rashes, [ ]  Wounds     Physical Examination  Filed Vitals:   12/10/12 1438  BP: 102/73  Pulse: 55  Resp: 14   Filed Weights   12/10/12 1438  Weight: 140 lb (63.504 kg)   Body mass index is 20.09 kg/(m^2).    Range 4d  ago   Cholesterol 0 - 200 mg/dL 629    Comments: ATP III Classification: < 200 mg/dL Desirable 528 - 413 mg/dL Borderline High >= 244 mg/dL High   Triglycerides <010 mg/dL 272    HDL >53 mg/dL 68    Total CHOL/HDL Ratio Ratio 2.5    VLDL 0 - 40 mg/dL 21    LDL Cholesterol 0 - 99 mg/dL 80    Comments:  Total Cholesterol/HDL Ratio:CHD Risk Coronary Heart Disease Risk Table Men Women 1/2 Average Risk 3.4 3.3 Average Risk 5.0 4.4 2X Average Risk 9.6 7.1 3X Average Risk 23.4 11.0 Use the calculated Patient Ratio above and the CHD Risk table  to determine the patient's CHD Risk. ATP III Classification (LDL): < 100 mg/dL Optimal 664 - 403 mg/dL Near or Above Optimal 474 - 159 mg/dL Borderline High 259 - 563 mg/dL High > 875 mg/dL Very High   Resulting Agency [facility]    Result Narrative    Performed at: [facility] [address] [place], [place] [address]      Specimen Collected: 12/06/12 1:50 PM     General: A&O x 3, WDWN,  Gait: limp Eyes: PERRLA, Pulmonary: CTAB, without wheezes , rales or rhonchi Cardiac: regular Rythm , without murmur          Carotid Bruits Left Right   Negative Negative                             VASCULAR EXAM: Extremities without ischemic changes  without Gangrene ; without open wounds.                                                                                                          LE Pulses LEFT RIGHT       FEMORAL   palpable   palpable        POPLITEAL  not palpable   not palpable       POSTERIOR TIBIAL  not palpable    palpable        DORSALIS PEDIS      ANTERIOR TIBIAL  palpable  palpable   AORTA Not palpable N/A   Abdomen: soft, NT, no masses Skin: no rashes, ulcers noted Musculoskeletal: no muscle wasting or atrophy  Neurologic: A&O X 3; Appropriate Affect ; SENSATION: normal; MOTOR FUNCTION:  moving all extremities equally, strength 5/5 in all extremities. Speech is fluent/normal. CN 2-12  intact.     Non-Invasive Vascular Imaging: DATE: 12/10/2012 ABI: RIGHT 1.28 (normal), Waveforms: triphasic;  LEFT 1.08 (normal), Waveforms: biphasic/triphasic DUPLEX SCAN OF BYPASS:  left leg graft is widely patent with elevated velocities distal to  anastomosis but decreased from previous exam on 08/06/12, mild plaque in the inflow native artery.   ASSESSMENT: Brady Bell is a 57 y.o. male who presents for 3 month follow up after left common femoral artery to anterior tibial artery bypass with non-reversed ipslateral greater saphenous vein  (Date: 09/06/11).  His left leg graft is widely patent with elevated velocities distal to  anastomosis but decreased from previous exam on 08/06/12, mild plaque in the inflow native artery. His risk factor for advancing his PAD remains his smoking, he was counseled again today and given written information re smoking cessation. Dr. Imogene Burn spoke with the patient and examined his left leg. He sees no contraindications to a left knee arthroscopy as long as no pressure is placed on the graft. Patient was advised not to wear his knee brace too tight so as not to place constant pressure on the arterial graft which is visible on the lateral surface of his left leg.  PLAN:  I discussed in depth with the patient the nature of atherosclerosis, and emphasized the importance of maximal medical management including strict control of blood pressure, blood glucose, and lipid levels, obtaining regular exercise, and cessation of smoking.  The patient is aware that without maximal medical management the underlying atherosclerotic disease process will progress, limiting the benefit of any interventions. Based on the patient's vascular studies and examination, pt will return to clinic in 6  months for ABI's, bilateral LE Duplex.  The patient was given information about PAD including signs, symptoms, treatment, what symptoms should prompt the patient to seek immediate medical care, and  risk reduction measures to take.  Charisse March, RN, MSN, FNP-C Office Phone: [phone]  Clinic MD: Imogene Burn  12/10/2012 2:12 PM

## 2012-12-13 NOTE — Addendum Note (Signed)
Addended by: Adria Dill L on: 12/13/2012 10:55 AM   Modules accepted: Orders

## 2013-01-24 ENCOUNTER — Telehealth: Payer: Self-pay | Admitting: *Deleted

## 2013-01-24 NOTE — Telephone Encounter (Signed)
Call received from patient indicating that he had spoken with Dr. Romeo Apple back in July and discussed his MRI results. He states he needs surgery, and he has been approved again for the 100% [facility] discount. Okey Regal has sent an e mail to verify status on his account. His last office visit was May 20,2014. Appointment? Please advise. Patient's # [phone] or [phone]

## 2013-01-24 NOTE — Telephone Encounter (Signed)
Since he did have the undersurface meniscal tear I agreed to get an MRI of his knee to see if that's any worse but my general impression is that there is nothing wrong with his knee and he is transferring his depression anxiety and grieving to the knee. If he does need surgery I would not feel comfortable doing it with this graft around the knee and risk of vascular compromise.      I M NOT OPERATING ON HIM

## 2013-01-25 NOTE — Telephone Encounter (Signed)
Patient informed of Dr. Harrison's reply. 

## 2013-05-02 ENCOUNTER — Other Ambulatory Visit: Payer: Self-pay | Admitting: Family

## 2013-05-02 DIAGNOSIS — I739 Peripheral vascular disease, unspecified: Secondary | ICD-10-CM

## 2013-05-02 DIAGNOSIS — Z48812 Encounter for surgical aftercare following surgery on the circulatory system: Secondary | ICD-10-CM

## 2013-05-24 ENCOUNTER — Encounter (HOSPITAL_COMMUNITY): Payer: Self-pay | Admitting: Emergency Medicine

## 2013-05-24 ENCOUNTER — Emergency Department (HOSPITAL_COMMUNITY): Payer: Medicaid Other

## 2013-05-24 ENCOUNTER — Emergency Department (HOSPITAL_COMMUNITY)
Admission: EM | Admit: 2013-05-24 | Discharge: 2013-05-24 | Disposition: A | Discharge status: Home / Self Care | Payer: Medicaid Other | Attending: Emergency Medicine | Admitting: Emergency Medicine

## 2013-05-24 DIAGNOSIS — W03XXXA Other fall on same level due to collision with another person, initial encounter: Secondary | ICD-10-CM | POA: Insufficient documentation

## 2013-05-24 DIAGNOSIS — G40909 Epilepsy, unspecified, not intractable, without status epilepticus: Secondary | ICD-10-CM | POA: Insufficient documentation

## 2013-05-24 DIAGNOSIS — G43909 Migraine, unspecified, not intractable, without status migrainosus: Secondary | ICD-10-CM | POA: Insufficient documentation

## 2013-05-24 DIAGNOSIS — Y9389 Activity, other specified: Secondary | ICD-10-CM | POA: Insufficient documentation

## 2013-05-24 DIAGNOSIS — W1809XA Striking against other object with subsequent fall, initial encounter: Secondary | ICD-10-CM | POA: Insufficient documentation

## 2013-05-24 DIAGNOSIS — G8929 Other chronic pain: Secondary | ICD-10-CM | POA: Insufficient documentation

## 2013-05-24 DIAGNOSIS — F17200 Nicotine dependence, unspecified, uncomplicated: Secondary | ICD-10-CM | POA: Insufficient documentation

## 2013-05-24 DIAGNOSIS — S20211A Contusion of right front wall of thorax, initial encounter: Secondary | ICD-10-CM

## 2013-05-24 DIAGNOSIS — Z8679 Personal history of other diseases of the circulatory system: Secondary | ICD-10-CM | POA: Insufficient documentation

## 2013-05-24 DIAGNOSIS — Z79899 Other long term (current) drug therapy: Secondary | ICD-10-CM | POA: Insufficient documentation

## 2013-05-24 DIAGNOSIS — S20219A Contusion of unspecified front wall of thorax, initial encounter: Secondary | ICD-10-CM | POA: Insufficient documentation

## 2013-05-24 DIAGNOSIS — Y92009 Unspecified place in unspecified non-institutional (private) residence as the place of occurrence of the external cause: Secondary | ICD-10-CM | POA: Insufficient documentation

## 2013-05-24 MED ORDER — CYCLOBENZAPRINE HCL 10 MG PO TABS: 10.0000 mg | ORAL_TABLET | Freq: Two times a day (BID) | ORAL | Status: DC | PRN
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
## 2013-05-24 NOTE — Discharge Instructions (Signed)
Continue to take advil and your Percocet. You can take the muscle relaxant for the muscle spasm. Follow up with Dr. Juanetta GoslingHawkins or return here for worsening symptoms. Apply ice to the area.
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
## 2013-05-26 NOTE — ED Provider Notes (Signed)
Medical screening examination/treatment/procedure(s) were performed by non-physician practitioner and as supervising physician I was immediately available for consultation/collaboration.  EKG Interpretation   None        Jerren Flinchbaugh, MD 05/26/13 2035 

## 2013-06-08 ENCOUNTER — Encounter: Payer: Self-pay | Admitting: Vascular Surgery

## 2013-06-09 ENCOUNTER — Encounter: Payer: Self-pay | Admitting: Family

## 2013-06-10 ENCOUNTER — Ambulatory Visit (HOSPITAL_COMMUNITY)
Admission: RE | Admit: 2013-06-10 | Discharge: 2013-06-10 | Disposition: A | Discharge status: Home / Self Care | Payer: Medicaid Other | Source: Ambulatory Visit | Attending: Family | Admitting: Family

## 2013-06-10 ENCOUNTER — Other Ambulatory Visit (HOSPITAL_COMMUNITY): Payer: Self-pay

## 2013-06-10 ENCOUNTER — Ambulatory Visit (INDEPENDENT_AMBULATORY_CARE_PROVIDER_SITE_OTHER)
Admission: RE | Admit: 2013-06-10 | Discharge: 2013-06-10 | Disposition: A | Discharge status: Home / Self Care | Payer: Medicaid Other | Source: Ambulatory Visit | Attending: Family | Admitting: Family

## 2013-06-10 ENCOUNTER — Ambulatory Visit: Payer: Self-pay | Admitting: Family

## 2013-06-10 ENCOUNTER — Encounter: Payer: Self-pay | Admitting: Family

## 2013-06-10 ENCOUNTER — Encounter (HOSPITAL_COMMUNITY): Payer: Self-pay

## 2013-06-10 ENCOUNTER — Ambulatory Visit (INDEPENDENT_AMBULATORY_CARE_PROVIDER_SITE_OTHER): Payer: Medicaid Other | Admitting: Family

## 2013-06-10 VITALS — BP 130/94 | HR 70 | Ht 70.0 in | Wt 137.7 lb

## 2013-06-10 DIAGNOSIS — I739 Peripheral vascular disease, unspecified: Secondary | ICD-10-CM

## 2013-06-10 DIAGNOSIS — Z48812 Encounter for surgical aftercare following surgery on the circulatory system: Secondary | ICD-10-CM | POA: Insufficient documentation
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
## 2013-06-10 NOTE — Patient Instructions (Addendum)
Peripheral Vascular Disease Peripheral Vascular Disease (PVD), also called Peripheral Arterial Disease (PAD), is a circulation problem caused by cholesterol (atherosclerotic plaque) deposits in the arteries. PVD commonly occurs in the lower extremities (legs) but it can occur in other areas of the body, such as your arms. The cholesterol buildup in the arteries reduces blood flow which can cause pain and other serious problems. The presence of PVD can place a person at risk for Coronary Artery Disease (CAD).  CAUSES  Causes of PVD can be many. It is usually associated with more than one risk factor such as:   High Cholesterol.  Smoking.  Diabetes.  Lack of exercise or inactivity.  High blood pressure (hypertension).  Obesity.  Family history. SYMPTOMS   When the lower extremities are affected, patients with PVD may experience:  Leg pain with exertion or physical activity. This is called INTERMITTENT CLAUDICATION. This may present as cramping or numbness with physical activity. The location of the pain is associated with the level of blockage. For example, blockage at the abdominal level (distal abdominal aorta) may result in buttock or hip pain. Lower leg arterial blockage may result in calf pain.  As PVD becomes more severe, pain can develop with less physical activity.  In people with severe PVD, leg pain may occur at rest.  Other PVD signs and symptoms:  Leg numbness or weakness.  Coldness in the affected leg or foot, especially when compared to the other leg.  A change in leg color.  Patients with significant PVD are more prone to ulcers or sores on toes, feet or legs. These may take longer to heal or may reoccur. The ulcers or sores can become infected.  If signs and symptoms of PVD are ignored, gangrene may occur. This can result in the loss of toes or loss of an entire limb.  Not all leg pain is related to PVD. Other medical conditions can cause leg pain such  as:  Blood clots (embolism) or Deep Vein Thrombosis.  Inflammation of the blood vessels (vasculitis).  Spinal stenosis. DIAGNOSIS  Diagnosis of PVD can involve several different types of tests. These can include:  Pulse Volume Recording Method (PVR). This test is simple, painless and does not involve the use of X-rays. PVR involves measuring and comparing the blood pressure in the arms and legs. An ABI (Ankle-Brachial Index) is calculated. The normal ratio of blood pressures is 1. As this number becomes smaller, it indicates more severe disease.  < 0.95  indicates significant narrowing in one or more leg vessels.  <0.8 there will usually be pain in the foot, leg or buttock with exercise.  <0.4 will usually have pain in the legs at rest.  <0.25  usually indicates limb threatening PVD.  Doppler detection of pulses in the legs. This test is painless and checks to see if you have a pulses in your legs/feet.  A dye or contrast material (a substance that highlights the blood vessels so they show up on x-ray) may be given to help your caregiver better see the arteries for the following tests. The dye is eliminated from your body by the kidney's. Your caregiver may order blood work to check your kidney function and other laboratory values before the following tests are performed:  Magnetic Resonance Angiography (MRA). An MRA is a picture study of the blood vessels and arteries. The MRA machine uses a large magnet to produce images of the blood vessels.  Computed Tomography Angiography (CTA). A CTA is a   specialized x-ray that looks at how the blood flows in your blood vessels. An IV may be inserted into your arm so contrast dye can be injected.  Angiogram. Is a procedure that uses x-rays to look at your blood vessels. This procedure is minimally invasive, meaning a small incision (cut) is made in your groin. A small tube (catheter) is then inserted into the artery of your groin. The catheter is  guided to the blood vessel or artery your caregiver wants to examine. Contrast dye is injected into the catheter. X-rays are then taken of the blood vessel or artery. After the images are obtained, the catheter is taken out. TREATMENT  Treatment of PVD involves many interventions which may include:  Lifestyle changes:  Quitting smoking.  Exercise.  Following a low fat, low cholesterol diet.  Control of diabetes.  Foot care is very important to the PVD patient. Good foot care can help prevent infection.  Medication:  Cholesterol-lowering medicine.  Blood pressure medicine.  Anti-platelet drugs.  Certain medicines may reduce symptoms of Intermittent Claudication.  Interventional/Surgical options:  Angioplasty. An Angioplasty is a procedure that inflates a balloon in the blocked artery. This opens the blocked artery to improve blood flow.  Stent Implant. A wire mesh tube (stent) is placed in the artery. The stent expands and stays in place, allowing the artery to remain open.  Peripheral Bypass Surgery. This is a surgical procedure that reroutes the blood around a blocked artery to help improve blood flow. This type of procedure may be performed if Angioplasty or stent implants are not an option. SEEK IMMEDIATE MEDICAL CARE IF:   You develop pain or numbness in your arms or legs.  Your arm or leg turns cold, becomes blue in color.  You develop redness, warmth, swelling and pain in your arms or legs. MAKE SURE YOU:   Understand these instructions.  Will watch your condition.  Will get help right away if you are not doing well or get worse. Document Released: 04/24/2004 Document Revised: 06/09/2011 Document Reviewed: 03/21/2008 ExitCare Patient Information 2014 [employer].   Smoking Cessation Quitting smoking is important to your health and has many advantages. However, it is not always easy to quit since nicotine is a very addictive drug. Often times, people try 3  times or more before being able to quit. This document explains the best ways for you to prepare to quit smoking. Quitting takes hard work and a lot of effort, but you can do it. ADVANTAGES OF QUITTING SMOKING  You will live longer, feel better, and live better.  Your body will feel the impact of quitting smoking almost immediately.  Within 20 minutes, blood pressure decreases. Your pulse returns to its normal level.  After 8 hours, carbon monoxide levels in the blood return to normal. Your oxygen level increases.  After 24 hours, the chance of having a heart attack starts to decrease. Your breath, hair, and body stop smelling like smoke.  After 48 hours, damaged nerve endings begin to recover. Your sense of taste and smell improve.  After 72 hours, the body is virtually free of nicotine. Your bronchial tubes relax and breathing becomes easier.  After 2 to 12 weeks, lungs can hold more air. Exercise becomes easier and circulation improves.  The risk of having a heart attack, stroke, cancer, or lung disease is greatly reduced.  After 1 year, the risk of coronary heart disease is cut in half.  After 5 years, the risk of stroke falls to   the same as a nonsmoker.  After 10 years, the risk of lung cancer is cut in half and the risk of other cancers decreases significantly.  After 15 years, the risk of coronary heart disease drops, usually to the level of a nonsmoker.  If you are pregnant, quitting smoking will improve your chances of having a healthy baby.  The people you live with, especially any children, will be healthier.  You will have extra money to spend on things other than cigarettes. QUESTIONS TO THINK ABOUT BEFORE ATTEMPTING TO QUIT You may want to talk about your answers with your caregiver.  Why do you want to quit?  If you tried to quit in the past, what helped and what did not?  What will be the most difficult situations for you after you quit? How will you plan to  handle them?  Who can help you through the tough times? Your family? Friends? A caregiver?  What pleasures do you get from smoking? What ways can you still get pleasure if you quit? Here are some questions to ask your caregiver:  How can you help me to be successful at quitting?  What medicine do you think would be best for me and how should I take it?  What should I do if I need more help?  What is smoking withdrawal like? How can I get information on withdrawal? GET READY  Set a quit date.  Change your environment by getting rid of all cigarettes, ashtrays, matches, and lighters in your home, car, or work. Do not let people smoke in your home.  Review your past attempts to quit. Think about what worked and what did not. GET SUPPORT AND ENCOURAGEMENT You have a better chance of being successful if you have help. You can get support in many ways.  Tell your family, friends, and co-workers that you are going to quit and need their support. Ask them not to smoke around you.  Get individual, group, or telephone counseling and support. Programs are available at local hospitals and health centers. Call your local health department for information about programs in your area.  Spiritual beliefs and practices may help some smokers quit.  Download a "quit meter" on your computer to keep track of quit statistics, such as how long you have gone without smoking, cigarettes not smoked, and money saved.  Get a self-help book about quitting smoking and staying off of tobacco. LEARN NEW SKILLS AND BEHAVIORS  Distract yourself from urges to smoke. Talk to someone, go for a walk, or occupy your time with a task.  Change your normal routine. Take a different route to work. Drink tea instead of coffee. Eat breakfast in a different place.  Reduce your stress. Take a hot bath, exercise, or read a book.  Plan something enjoyable to do every day. Reward yourself for not smoking.  Explore  interactive web-based programs that specialize in helping you quit. GET MEDICINE AND USE IT CORRECTLY Medicines can help you stop smoking and decrease the urge to smoke. Combining medicine with the above behavioral methods and support can greatly increase your chances of successfully quitting smoking.  Nicotine replacement therapy helps deliver nicotine to your body without the negative effects and risks of smoking. Nicotine replacement therapy includes nicotine gum, lozenges, inhalers, nasal sprays, and skin patches. Some may be available over-the-counter and others require a prescription.  Antidepressant medicine helps people abstain from smoking, but how this works is unknown. This medicine is available by prescription.    Nicotinic receptor partial agonist medicine simulates the effect of nicotine in your brain. This medicine is available by prescription. Ask your caregiver for advice about which medicines to use and how to use them based on your health history. Your caregiver will tell you what side effects to look out for if you choose to be on a medicine or therapy. Carefully read the information on the package. Do not use any other product containing nicotine while using a nicotine replacement product.  RELAPSE OR DIFFICULT SITUATIONS Most relapses occur within the first 3 months after quitting. Do not be discouraged if you start smoking again. Remember, most people try several times before finally quitting. You may have symptoms of withdrawal because your body is used to nicotine. You may crave cigarettes, be irritable, feel very hungry, cough often, get headaches, or have difficulty concentrating. The withdrawal symptoms are only temporary. They are strongest when you first quit, but they will go away within 10 14 days. To reduce the chances of relapse, try to:  Avoid drinking alcohol. Drinking lowers your chances of successfully quitting.  Reduce the amount of caffeine you consume. Once you  quit smoking, the amount of caffeine in your body increases and can give you symptoms, such as a rapid heartbeat, sweating, and anxiety.  Avoid smokers because they can make you want to smoke.  Do not let weight gain distract you. Many smokers will gain weight when they quit, usually less than 10 pounds. Eat a healthy diet and stay active. You can always lose the weight gained after you quit.  Find ways to improve your mood other than smoking. FOR MORE INFORMATION  www.smokefree.gov  Document Released: 03/11/2001 Document Revised: 09/16/2011 Document Reviewed: 06/26/2011 [employer] Patient Information 2014 [employer].  

## 2013-06-13 NOTE — Addendum Note (Signed)
Addended by: Sharee PimpleMCCHESNEY, Cyniah Gossard K on: 06/13/2013 04:20 PM   Modules accepted: Orders

## 2013-12-15 ENCOUNTER — Encounter: Payer: Self-pay | Admitting: Family

## 2013-12-16 ENCOUNTER — Ambulatory Visit (HOSPITAL_COMMUNITY)
Admission: RE | Admit: 2013-12-16 | Discharge: 2013-12-16 | Disposition: A | Discharge status: Home / Self Care | Payer: Medicaid Other | Source: Ambulatory Visit | Attending: Family | Admitting: Family

## 2013-12-16 ENCOUNTER — Other Ambulatory Visit (HOSPITAL_COMMUNITY): Payer: Medicaid Other

## 2013-12-16 ENCOUNTER — Encounter: Payer: Self-pay | Admitting: Family

## 2013-12-16 ENCOUNTER — Ambulatory Visit (INDEPENDENT_AMBULATORY_CARE_PROVIDER_SITE_OTHER): Payer: Medicaid Other | Admitting: Family

## 2013-12-16 VITALS — BP 144/101 | HR 74 | Resp 16 | Ht 70.0 in | Wt 136.0 lb

## 2013-12-16 DIAGNOSIS — I739 Peripheral vascular disease, unspecified: Secondary | ICD-10-CM

## 2013-12-16 DIAGNOSIS — M79609 Pain in unspecified limb: Secondary | ICD-10-CM | POA: Diagnosis not present

## 2013-12-16 DIAGNOSIS — Z48812 Encounter for surgical aftercare following surgery on the circulatory system: Secondary | ICD-10-CM

## 2013-12-16 DIAGNOSIS — R252 Cramp and spasm: Secondary | ICD-10-CM

## 2013-12-16 DIAGNOSIS — R200 Anesthesia of skin: Secondary | ICD-10-CM

## 2013-12-16 DIAGNOSIS — R202 Paresthesia of skin: Secondary | ICD-10-CM

## 2013-12-16 DIAGNOSIS — R209 Unspecified disturbances of skin sensation: Secondary | ICD-10-CM

## 2013-12-16 NOTE — Patient Instructions (Addendum)
Peripheral Vascular Disease Peripheral Vascular Disease (PVD), also called Peripheral Arterial Disease (PAD), is a circulation problem caused by cholesterol (atherosclerotic plaque) deposits in the arteries. PVD commonly occurs in the lower extremities (legs) but it can occur in other areas of the body, such as your arms. The cholesterol buildup in the arteries reduces blood flow which can cause pain and other serious problems. The presence of PVD can place a person at risk for Coronary Artery Disease (CAD).  CAUSES  Causes of PVD can be many. It is usually associated with more than one risk factor such as:   High Cholesterol.  Smoking.  Diabetes.  Lack of exercise or inactivity.  High blood pressure (hypertension).  Obesity.  Family history. SYMPTOMS   When the lower extremities are affected, patients with PVD may experience:  Leg pain with exertion or physical activity. This is called INTERMITTENT CLAUDICATION. This may present as cramping or numbness with physical activity. The location of the pain is associated with the level of blockage. For example, blockage at the abdominal level (distal abdominal aorta) may result in buttock or hip pain. Lower leg arterial blockage may result in calf pain.  As PVD becomes more severe, pain can develop with less physical activity.  In people with severe PVD, leg pain may occur at rest.  Other PVD signs and symptoms:  Leg numbness or weakness.  Coldness in the affected leg or foot, especially when compared to the other leg.  A change in leg color.  Patients with significant PVD are more prone to ulcers or sores on toes, feet or legs. These may take longer to heal or may reoccur. The ulcers or sores can become infected.  If signs and symptoms of PVD are ignored, gangrene may occur. This can result in the loss of toes or loss of an entire limb.  Not all leg pain is related to PVD. Other medical conditions can cause leg pain such  as:  Blood clots (embolism) or Deep Vein Thrombosis.  Inflammation of the blood vessels (vasculitis).  Spinal stenosis. DIAGNOSIS  Diagnosis of PVD can involve several different types of tests. These can include:  Pulse Volume Recording Method (PVR). This test is simple, painless and does not involve the use of X-rays. PVR involves measuring and comparing the blood pressure in the arms and legs. An ABI (Ankle-Brachial Index) is calculated. The normal ratio of blood pressures is 1. As this number becomes smaller, it indicates more severe disease.  < 0.95 - indicates significant narrowing in one or more leg vessels.  <0.8 - there will usually be pain in the foot, leg or buttock with exercise.  <0.4 - will usually have pain in the legs at rest.  <0.25 - usually indicates limb threatening PVD.  Doppler detection of pulses in the legs. This test is painless and checks to see if you have a pulses in your legs/feet.  A dye or contrast material (a substance that highlights the blood vessels so they show up on x-ray) may be given to help your caregiver better see the arteries for the following tests. The dye is eliminated from your body by the kidney's. Your caregiver may order blood work to check your kidney function and other laboratory values before the following tests are performed:  Magnetic Resonance Angiography (MRA). An MRA is a picture study of the blood vessels and arteries. The MRA machine uses a large magnet to produce images of the blood vessels.  Computed Tomography Angiography (CTA). A CTA   is a specialized x-ray that looks at how the blood flows in your blood vessels. An IV may be inserted into your arm so contrast dye can be injected.  Angiogram. Is a procedure that uses x-rays to look at your blood vessels. This procedure is minimally invasive, meaning a small incision (cut) is made in your groin. A small tube (catheter) is then inserted into the artery of your groin. The catheter  is guided to the blood vessel or artery your caregiver wants to examine. Contrast dye is injected into the catheter. X-rays are then taken of the blood vessel or artery. After the images are obtained, the catheter is taken out. TREATMENT  Treatment of PVD involves many interventions which may include:  Lifestyle changes:  Quitting smoking.  Exercise.  Following a low fat, low cholesterol diet.  Control of diabetes.  Foot care is very important to the PVD patient. Good foot care can help prevent infection.  Medication:  Cholesterol-lowering medicine.  Blood pressure medicine.  Anti-platelet drugs.  Certain medicines may reduce symptoms of Intermittent Claudication.  Interventional/Surgical options:  Angioplasty. An Angioplasty is a procedure that inflates a balloon in the blocked artery. This opens the blocked artery to improve blood flow.  Stent Implant. A wire mesh tube (stent) is placed in the artery. The stent expands and stays in place, allowing the artery to remain open.  Peripheral Bypass Surgery. This is a surgical procedure that reroutes the blood around a blocked artery to help improve blood flow. This type of procedure may be performed if Angioplasty or stent implants are not an option. SEEK IMMEDIATE MEDICAL CARE IF:   You develop pain or numbness in your arms or legs.  Your arm or leg turns cold, becomes blue in color.  You develop redness, warmth, swelling and pain in your arms or legs. MAKE SURE YOU:   Understand these instructions.  Will watch your condition.  Will get help right away if you are not doing well or get worse. Document Released: 04/24/2004 Document Revised: 06/09/2011 Document Reviewed: 03/21/2008 ExitCare Patient Information 2015 [employer]. This information is not intended to replace advice given to you by your health care provider. Make sure you discuss any questions you have with your health care provider.   Smoking  Cessation Quitting smoking is important to your health and has many advantages. However, it is not always easy to quit since nicotine is a very addictive drug. Oftentimes, people try 3 times or more before being able to quit. This document explains the best ways for you to prepare to quit smoking. Quitting takes hard work and a lot of effort, but you can do it. ADVANTAGES OF QUITTING SMOKING  You will live longer, feel better, and live better.  Your body will feel the impact of quitting smoking almost immediately.  Within 20 minutes, blood pressure decreases. Your pulse returns to its normal level.  After 8 hours, carbon monoxide levels in the blood return to normal. Your oxygen level increases.  After 24 hours, the chance of having a heart attack starts to decrease. Your breath, hair, and body stop smelling like smoke.  After 48 hours, damaged nerve endings begin to recover. Your sense of taste and smell improve.  After 72 hours, the body is virtually free of nicotine. Your bronchial tubes relax and breathing becomes easier.  After 2 to 12 weeks, lungs can hold more air. Exercise becomes easier and circulation improves.  The risk of having a heart attack, stroke, cancer,   or lung disease is greatly reduced.  After 1 year, the risk of coronary heart disease is cut in half.  After 5 years, the risk of stroke falls to the same as a nonsmoker.  After 10 years, the risk of lung cancer is cut in half and the risk of other cancers decreases significantly.  After 15 years, the risk of coronary heart disease drops, usually to the level of a nonsmoker.  If you are pregnant, quitting smoking will improve your chances of having a healthy baby.  The people you live with, especially any children, will be healthier.  You will have extra money to spend on things other than cigarettes. QUESTIONS TO THINK ABOUT BEFORE ATTEMPTING TO QUIT You may want to talk about your answers with your health care  provider.  Why do you want to quit?  If you tried to quit in the past, what helped and what did not?  What will be the most difficult situations for you after you quit? How will you plan to handle them?  Who can help you through the tough times? Your family? Friends? A health care provider?  What pleasures do you get from smoking? What ways can you still get pleasure if you quit? Here are some questions to ask your health care provider:  How can you help me to be successful at quitting?  What medicine do you think would be best for me and how should I take it?  What should I do if I need more help?  What is smoking withdrawal like? How can I get information on withdrawal? GET READY  Set a quit date.  Change your environment by getting rid of all cigarettes, ashtrays, matches, and lighters in your home, car, or work. Do not let people smoke in your home.  Review your past attempts to quit. Think about what worked and what did not. GET SUPPORT AND ENCOURAGEMENT You have a better chance of being successful if you have help. You can get support in many ways.  Tell your family, friends, and coworkers that you are going to quit and need their support. Ask them not to smoke around you.  Get individual, group, or telephone counseling and support. Programs are available at local hospitals and health centers. Call your local health department for information about programs in your area.  Spiritual beliefs and practices may help some smokers quit.  Download a "quit meter" on your computer to keep track of quit statistics, such as how long you have gone without smoking, cigarettes not smoked, and money saved.  Get a self-help book about quitting smoking and staying off tobacco. LEARN NEW SKILLS AND BEHAVIORS  Distract yourself from urges to smoke. Talk to someone, go for a walk, or occupy your time with a task.  Change your normal routine. Take a different route to work. Drink tea  instead of coffee. Eat breakfast in a different place.  Reduce your stress. Take a hot bath, exercise, or read a book.  Plan something enjoyable to do every day. Reward yourself for not smoking.  Explore interactive web-based programs that specialize in helping you quit. GET MEDICINE AND USE IT CORRECTLY Medicines can help you stop smoking and decrease the urge to smoke. Combining medicine with the above behavioral methods and support can greatly increase your chances of successfully quitting smoking.  Nicotine replacement therapy helps deliver nicotine to your body without the negative effects and risks of smoking. Nicotine replacement therapy includes nicotine gum, lozenges, inhalers,   nasal sprays, and skin patches. Some may be available over-the-counter and others require a prescription.  Antidepressant medicine helps people abstain from smoking, but how this works is unknown. This medicine is available by prescription.  Nicotinic receptor partial agonist medicine simulates the effect of nicotine in your brain. This medicine is available by prescription. Ask your health care provider for advice about which medicines to use and how to use them based on your health history. Your health care provider will tell you what side effects to look out for if you choose to be on a medicine or therapy. Carefully read the information on the package. Do not use any other product containing nicotine while using a nicotine replacement product.  RELAPSE OR DIFFICULT SITUATIONS Most relapses occur within the first 3 months after quitting. Do not be discouraged if you start smoking again. Remember, most people try several times before finally quitting. You may have symptoms of withdrawal because your body is used to nicotine. You may crave cigarettes, be irritable, feel very hungry, cough often, get headaches, or have difficulty concentrating. The withdrawal symptoms are only temporary. They are strongest when you  first quit, but they will go away within 10-14 days. To reduce the chances of relapse, try to:  Avoid drinking alcohol. Drinking lowers your chances of successfully quitting.  Reduce the amount of caffeine you consume. Once you quit smoking, the amount of caffeine in your body increases and can give you symptoms, such as a rapid heartbeat, sweating, and anxiety.  Avoid smokers because they can make you want to smoke.  Do not let weight gain distract you. Many smokers will gain weight when they quit, usually less than 10 pounds. Eat a healthy diet and stay active. You can always lose the weight gained after you quit.  Find ways to improve your mood other than smoking. FOR MORE INFORMATION  www.smokefree.gov  Document Released: 03/11/2001 Document Revised: 08/01/2013 Document Reviewed: 06/26/2011 ExitCare Patient Information 2015 [employer]. This information is not intended to replace advice given to you by your health care provider. Make sure you discuss any questions you have with your health care provider.  

## 2013-12-16 NOTE — Progress Notes (Signed)
[facility] [place] HISTORY AND PHYSICAL -PAD  History of Present Illness Brady Bell is a 58 y.o. male patient of Dr. Imogene Burn who is s/p left common femoral artery to anterior tibial artery bypass with non-reversed ipslateral greater saphenous vein (Date: 09/06/11).  He returns today for routine surveillance.  He had been homeless for about 2 years, but is now living in an apt.  He is trying to get his teeth addressed with dental care. He now has disability status.  He denies claudication symptoms, denies non healing wounds, his left knee has a medial meniscus tear and he needs surgical repair, he wears a brace on his knee.   He denies history of stroke or TIA.  Pt Diabetic: No  Pt smoker: smoker (1/2 ppd x since age 67 with several periods of abstinence)   Pt meds include:  Statin :No, states his cholesterol is good  ASA: Yes  Other anticoagulants/antiplatelets: no   Past Medical History  Diagnosis Date  . Migraines   . Hypoglycemia   . Seizures   . Chronic knee pain   . Chronic ankle pain   . Peripheral vascular disease     Social History History  Substance Use Topics  . Smoking status: Current Every Day Smoker -- 0.50 packs/day for 30 years    Types: Cigarettes  . Smokeless tobacco: Never Used     Comment: pt states that he is trying his best to quit  . Alcohol Use: Yes     Comment: 3 or 4 times a week    Family History Family History  Problem Relation Age of Onset  . Diabetes    . Alcohol abuse Father     Past Surgical History  Procedure Laterality Date  . Femoral-tibial bypass graft  09/06/2011    Procedure: BYPASS GRAFT FEMORAL-TIBIAL ARTERY;  Surgeon: Fransisco Hertz, MD;  Location: [facility] OR;  Service: Vascular;  Laterality: Left;  . Fasciotomy  09/06/2011    Procedure: FASCIOTOMY;  Surgeon: Fransisco Hertz, MD;  Location: [facility] OR;  Service: Vascular;  Laterality: Left;  . Pr vein bypass graft,aorto-fem-pop  09/06/11    Left     No Known  Allergies  Current Outpatient Prescriptions  Medication Sig Dispense Refill  . alprazolam (XANAX) 2 MG tablet Take 2 mg by mouth at bedtime.       . gabapentin (NEURONTIN) 300 MG capsule Take 300 mg by mouth 3 (three) times daily.      Marland Kitchen topiramate (TOPAMAX) 100 MG tablet Take 100 mg by mouth daily.       . cyclobenzaprine (FLEXERIL) 10 MG tablet Take 1 tablet (10 mg total) by mouth 2 (two) times daily as needed for muscle spasms.  20 tablet  0  . oxyCODONE-acetaminophen (PERCOCET/ROXICET) 5-325 MG per tablet Take 1 tablet by mouth every 6 (six) hours as needed for pain.       No current facility-administered medications for this visit.    ROS: See HPI for pertinent positives and negatives.   Physical Examination  Filed Vitals:   12/16/13 1407  BP: 144/101  Pulse: 74  Resp: 16  Height:  (1.778 m)  Weight: 136 lb (61.689 kg)  SpO2: 100%   Body mass index is 19.51 kg/(m^2).  General: A&O x 3, WDWN.  Gait: normal  Eyes: PERRLA.  Pulmonary: CTAB, without wheezes , rales or rhonchi.  Cardiac: regular Rythm , without detected murmur.  Carotid Bruits  Left  Right  Negative  Negative    Aorta is not palpable.  Radial pulses: are 2+ palpable and =   VASCULAR EXAM:  Extremities without ischemic changes  without Gangrene; without open wounds. No peripheral edema. LE Pulses  LEFT  RIGHT   FEMORAL  palpable  palpable   POPLITEAL  graft palpable medially  not palpable   POSTERIOR TIBIAL  not palpable  2+palpable   DORSALIS PEDIS  ANTERIOR TIBIAL  1+palpable  1+palpable    Abdomen: soft, NT, no masses.  Skin: no rashes, no ulcers noted.  Musculoskeletal: no muscle wasting or atrophy. Neoprene brace in place left knee.  Neurologic: A&O X 3; Appropriate Affect ; SENSATION: normal; MOTOR FUNCTION: moving all extremities equally, motor strength 5/5 throughout. Speech is fluent/normal. CN 2-12 intact.     Non-Invasive Vascular Imaging: DATE: 12/16/2013 ABI: RIGHT 1.26,  Waveforms: tri and biphasic, TBI 0.84;  LEFT 1.14, Waveforms: bi and triphasic, TBI 0.65 Previous (06/10/13) ABI's: Right: 1.34, Left: 1.15   ASSESSMENT: Brady Bell is a 58 y.o. male who is s/p left common femoral artery to anterior tibial artery bypass with non-reversed ipslateral greater saphenous vein (Date: 09/06/11).  He denies claudication symptoms, denies non healing wounds. Today's ABI's indicate no evidence of arterial occlusive in either lower extremity. His insurance denied coverage of lower extremity arterial Duplex. Fortunately he does not have DM, unfortunately he continues to smoke and was counseled re smoking cessation.   PLAN:  I discussed in depth with the patient the nature of atherosclerosis, and emphasized the importance of maximal medical management including strict control of blood pressure, blood glucose, and lipid levels, obtaining regular exercise, and cessation of smoking.  The patient is aware that without maximal medical management the underlying atherosclerotic disease process will progress, limiting the benefit of any interventions.  Based on the patient's vascular studies and examination, pt will return to clinic in 1 year with ABI's and left lower extremity arterial Duplex.  The patient was given information about PAD including signs, symptoms, treatment, what symptoms should prompt the patient to seek immediate medical care, and risk reduction measures to take.  Charisse March, RN, MSN, FNP-C [facility] Phone: [phone]  Clinic MD: Imogene Burn  12/16/2013 2:03 PM

## 2013-12-16 NOTE — Addendum Note (Signed)
Addended by: Sharee Pimple on: 12/16/2013 05:49 PM   Modules accepted: Orders

## 2014-02-07 ENCOUNTER — Encounter: Payer: Self-pay | Admitting: Family

## 2014-02-07 ENCOUNTER — Telehealth: Payer: Self-pay

## 2014-02-07 NOTE — Telephone Encounter (Signed)
Phone call from pt.  Reported noting a lump in the lower part of the left leg incision approx. 2-3 days ago.  Reported the lump to be about half-dollar-size and raised.  Stated there is some warmth in the area.  Denied redness or discoloration.  Denied any swelling in the lower extremities.  Denied any breakdown of the incisional area.  Did report his leg was bothering him over the past week, and has "a torn meniscus" in left knee, and has been using a cane.  Denies any recent injury.  Reports some chills; hasn't checked his temperature.  Reported he recently had a tooth extracted.  Reported he has to line up transportation and must give at least 3 days notice.  Advised will call him back with an appt.  Agrees with plan.

## 2014-02-07 NOTE — Telephone Encounter (Signed)
Spoke with patient to schedule, dpm °

## 2014-02-09 ENCOUNTER — Encounter: Payer: Self-pay | Admitting: Family

## 2014-02-10 ENCOUNTER — Encounter: Payer: Self-pay | Admitting: Family

## 2014-02-10 ENCOUNTER — Ambulatory Visit (INDEPENDENT_AMBULATORY_CARE_PROVIDER_SITE_OTHER): Payer: Medicaid Other | Admitting: Family

## 2014-02-10 VITALS — BP 162/96 | HR 66 | Temp 97.3°F | Resp 18 | Ht 70.0 in | Wt 139.4 lb

## 2014-02-10 DIAGNOSIS — M79662 Pain in left lower leg: Secondary | ICD-10-CM

## 2014-02-10 DIAGNOSIS — M25569 Pain in unspecified knee: Secondary | ICD-10-CM | POA: Insufficient documentation

## 2014-02-10 NOTE — Progress Notes (Signed)
[facility] [place] HISTORY AND PHYSICAL -PAD  History of Present Illness Brady Bell is a 58 y.o. male patient of Dr. Imogene Burnhen who is s/p left common femoral artery to anterior tibial artery bypass with non-reversed ipslateral greater saphenous vein (Date: 09/06/11).  He returns today with c/o new appearance of soft lump medial aspect of mid lower leg, not painful. He had been homeless for about 2 years, but is now living in an apt.  He is trying to get his teeth addressed with dental care. He now has disability status.  He denies claudication symptoms, denies non healing wounds, his left knee has a medial meniscus tear and he needs surgical repair, he wears a brace on his knee.   He denies history of stroke or TIA.  Pt Diabetic: No  Pt smoker: smoker (1/2 ppd x since age 58 with several periods of abstinence)   Pt meds include:  Statin :No, states his cholesterol is good  ASA: Yes  Other anticoagulants/antiplatelets: no     Past Medical History  Diagnosis Date  . Migraines   . Hypoglycemia   . Seizures   . Chronic knee pain   . Chronic ankle pain   . Peripheral vascular disease     Social History History  Substance Use Topics  . Smoking status: Current Every Day Smoker -- 0.50 packs/day for 30 years    Types: Cigarettes  . Smokeless tobacco: Never Used     Comment: pt states that he is trying his best to quit  . Alcohol Use: Yes     Comment: 3 or 4 times a week    Family History Family History  Problem Relation Age of Onset  . Diabetes    . Alcohol abuse Father     Past Surgical History  Procedure Laterality Date  . Femoral-tibial bypass graft  09/06/2011    Procedure: BYPASS GRAFT FEMORAL-TIBIAL ARTERY;  Surgeon: Fransisco HertzBrian L Chen, MD;  [facility];  Service: Vascular;  Laterality: Left;  . Fasciotomy  09/06/2011    Procedure: FASCIOTOMY;  Surgeon: Fransisco HertzBrian L Chen, MD;  [facility];  Service: Vascular;  Laterality: Left;  . Pr vein  bypass graft,aorto-fem-pop  09/06/11    Left   . Tooth extraction  June-July-Aug. 2015    Several     No Known Allergies  Current Outpatient Prescriptions  Medication Sig Dispense Refill  . alprazolam (XANAX) 2 MG tablet Take 2 mg by mouth at bedtime.     . cyclobenzaprine (FLEXERIL) 10 MG tablet Take 1 tablet (10 mg total) by mouth 2 (two) times daily as needed for muscle spasms. 20 tablet 0  . gabapentin (NEURONTIN) 300 MG capsule Take 300 mg by mouth 3 (three) times daily.    Marland Kitchen. oxyCODONE-acetaminophen (PERCOCET/ROXICET) 5-325 MG per tablet Take 1 tablet by mouth every 6 (six) hours as needed for pain.    Marland Kitchen. topiramate (TOPAMAX) 100 MG tablet Take 100 mg by mouth daily.      No current facility-administered medications for this visit.    ROS: See HPI for pertinent positives and negatives.   Physical Examination  Filed Vitals:   02/10/14 1028  BP: 162/96  Pulse: 66  Temp: 97.3 F (36.3 C)  TempSrc: Oral  Resp: 18  Height: 5\' 10"  (1.778 m)  Weight: 139 lb 6.4 oz (63.231 kg)  SpO2: 97%   Body mass index is 20 kg/(m^2).  General: A&O x 3, WDWN.  Gait: normal  Eyes: PERRLA.  Pulmonary: CTAB, without wheezes , rales or rhonchi.  Cardiac: regular Rythm , without detected murmur.   Carotid Bruits  Left  Right    Negative  Negative    Aorta is not palpable.  Radial pulses: are 2+ palpable and =   VASCULAR EXAM:  Extremities without ischemic changes  without Gangrene; without open wounds. No peripheral edema. Small nest of superficial varicosities left lower leg, medial aspect.  LE Pulses  LEFT  RIGHT   FEMORAL  palpable  palpable   POPLITEAL  graft palpable medially  not palpable   POSTERIOR TIBIAL  not palpable  2+palpable   DORSALIS PEDIS  ANTERIOR TIBIAL  1+palpable  1+palpable    Abdomen: soft, NT, no palpable masses.  Skin: no rashes, no ulcers noted.  Musculoskeletal: no muscle wasting or atrophy. Possible left lower leg  medial aspect small venous varicosity. Neurologic: A&O X 3; Appropriate Affect ; SENSATION: normal; MOTOR FUNCTION: moving all extremities equally, motor strength 5/5 throughout. Speech is fluent/normal. CN 2-12 intact.      Non-Invasive Vascular Imaging: DATE: 12/16/2013 ABI: RIGHT 1.26, Waveforms: tri and biphasic, TBI 0.84; LEFT 1.14, Waveforms: bi and triphasic, TBI 0.65 Previous (06/10/13) ABI's: Right: 1.34, Left: 1.15    ASSESSMENT: Brady Bell is a 58 y.o. male who is s/p left common femoral artery to anterior tibial artery bypass with non-reversed ipslateral greater saphenous vein (Date: 09/06/11).  He has a small superficial varicosity in left medial lower leg. Pedal pulses, femoral pulses, and graft are palpable. ABI's were normal two months ago with bi and triphasic waveforms. Unfortunately he continues to smoke. See Plan.   PLAN:  Warm moist compresses several times/day to left lower leg superficial varicosity.  20-30 mm Hg graduated compression knee high stockings, wear during the day, removed at bedtime. Pt was given printed information re [employer] in [place], low cost outlet for medical grade compression hose.  The patient was counseled re smoking cessation and given several free resources re smoking cessation.  I discussed in depth with the patient the nature of atherosclerosis, and emphasized the importance of maximal medical management including strict control of blood pressure, blood glucose, and lipid levels, obtaining regular exercise, and cessation of smoking.  The patient is aware that without maximal medical management the underlying atherosclerotic disease process will progress, limiting the benefit of any interventions.  Based on the patient's vascular studies and examination, pt will return to clinic in September, 2016 as scheduled with ABI's and left lower extremity arterial Duplex.  The patient was given information about PAD including signs,  symptoms, treatment, what symptoms should prompt the patient to seek immediate medical care, and risk reduction measures to take.  Charisse MarchSuzanne Roque Schill, RN, MSN, FNP-C [facility] [place] Office Phone: [phone]  Clinic MD: Imogene BurnChen  02/10/2014 10:34 AM

## 2014-02-10 NOTE — Patient Instructions (Signed)
Peripheral Vascular Disease Peripheral Vascular Disease (PVD), also called Peripheral Arterial Disease (PAD), is a circulation problem caused by cholesterol (atherosclerotic plaque) deposits in the arteries. PVD commonly occurs in the lower extremities (legs) but it can occur in other areas of the body, such as your arms. The cholesterol buildup in the arteries reduces blood flow which can cause pain and other serious problems. The presence of PVD can place a person at risk for Coronary Artery Disease (CAD).  CAUSES  Causes of PVD can be many. It is usually associated with more than one risk factor such as:   High Cholesterol.  Smoking.  Diabetes.  Lack of exercise or inactivity.  High blood pressure (hypertension).  Obesity.  Family history. SYMPTOMS   When the lower extremities are affected, patients with PVD may experience:  Leg pain with exertion or physical activity. This is called INTERMITTENT CLAUDICATION. This may present as cramping or numbness with physical activity. The location of the pain is associated with the level of blockage. For example, blockage at the abdominal level (distal abdominal aorta) may result in buttock or hip pain. Lower leg arterial blockage may result in calf pain.  As PVD becomes more severe, pain can develop with less physical activity.  In people with severe PVD, leg pain may occur at rest.  Other PVD signs and symptoms:  Leg numbness or weakness.  Coldness in the affected leg or foot, especially when compared to the other leg.  A change in leg color.  Patients with significant PVD are more prone to ulcers or sores on toes, feet or legs. These may take longer to heal or may reoccur. The ulcers or sores can become infected.  If signs and symptoms of PVD are ignored, gangrene may occur. This can result in the loss of toes or loss of an entire limb.  Not all leg pain is related to PVD. Other medical conditions can cause leg pain such  as:  Blood clots (embolism) or Deep Vein Thrombosis.  Inflammation of the blood vessels (vasculitis).  Spinal stenosis. DIAGNOSIS  Diagnosis of PVD can involve several different types of tests. These can include:  Pulse Volume Recording Method (PVR). This test is simple, painless and does not involve the use of X-rays. PVR involves measuring and comparing the blood pressure in the arms and legs. An ABI (Ankle-Brachial Index) is calculated. The normal ratio of blood pressures is 1. As this number becomes smaller, it indicates more severe disease.  < 0.95 - indicates significant narrowing in one or more leg vessels.  <0.8 - there will usually be pain in the foot, leg or buttock with exercise.  <0.4 - will usually have pain in the legs at rest.  <0.25 - usually indicates limb threatening PVD.  Doppler detection of pulses in the legs. This test is painless and checks to see if you have a pulses in your legs/feet.  A dye or contrast material (a substance that highlights the blood vessels so they show up on x-ray) may be given to help your caregiver better see the arteries for the following tests. The dye is eliminated from your body by the kidney's. Your caregiver may order blood work to check your kidney function and other laboratory values before the following tests are performed:  Magnetic Resonance Angiography (MRA). An MRA is a picture study of the blood vessels and arteries. The MRA machine uses a large magnet to produce images of the blood vessels.  Computed Tomography Angiography (CTA). A CTA   is a specialized x-ray that looks at how the blood flows in your blood vessels. An IV may be inserted into your arm so contrast dye can be injected.  Angiogram. Is a procedure that uses x-rays to look at your blood vessels. This procedure is minimally invasive, meaning a small incision (cut) is made in your groin. A small tube (catheter) is then inserted into the artery of your groin. The catheter  is guided to the blood vessel or artery your caregiver wants to examine. Contrast dye is injected into the catheter. X-rays are then taken of the blood vessel or artery. After the images are obtained, the catheter is taken out. TREATMENT  Treatment of PVD involves many interventions which may include:  Lifestyle changes:  Quitting smoking.  Exercise.  Following a low fat, low cholesterol diet.  Control of diabetes.  Foot care is very important to the PVD patient. Good foot care can help prevent infection.  Medication:  Cholesterol-lowering medicine.  Blood pressure medicine.  Anti-platelet drugs.  Certain medicines may reduce symptoms of Intermittent Claudication.  Interventional/Surgical options:  Angioplasty. An Angioplasty is a procedure that inflates a balloon in the blocked artery. This opens the blocked artery to improve blood flow.  Stent Implant. A wire mesh tube (stent) is placed in the artery. The stent expands and stays in place, allowing the artery to remain open.  Peripheral Bypass Surgery. This is a surgical procedure that reroutes the blood around a blocked artery to help improve blood flow. This type of procedure may be performed if Angioplasty or stent implants are not an option. SEEK IMMEDIATE MEDICAL CARE IF:   You develop pain or numbness in your arms or legs.  Your arm or leg turns cold, becomes blue in color.  You develop redness, warmth, swelling and pain in your arms or legs. MAKE SURE YOU:   Understand these instructions.  Will watch your condition.  Will get help right away if you are not doing well or get worse. Document Released: 04/24/2004 Document Revised: 06/09/2011 Document Reviewed: 03/21/2008 ExitCare Patient Information 2015 [employer]. This information is not intended to replace advice given to you by your health care provider. Make sure you discuss any questions you have with your health care provider.    Smoking  Cessation Quitting smoking is important to your health and has many advantages. However, it is not always easy to quit since nicotine is a very addictive drug. Oftentimes, people try 3 times or more before being able to quit. This document explains the best ways for you to prepare to quit smoking. Quitting takes hard work and a lot of effort, but you can do it. ADVANTAGES OF QUITTING SMOKING  You will live longer, feel better, and live better.  Your body will feel the impact of quitting smoking almost immediately.  Within 20 minutes, blood pressure decreases. Your pulse returns to its normal level.  After 8 hours, carbon monoxide levels in the blood return to normal. Your oxygen level increases.  After 24 hours, the chance of having a heart attack starts to decrease. Your breath, hair, and body stop smelling like smoke.  After 48 hours, damaged nerve endings begin to recover. Your sense of taste and smell improve.  After 72 hours, the body is virtually free of nicotine. Your bronchial tubes relax and breathing becomes easier.  After 2 to 12 weeks, lungs can hold more air. Exercise becomes easier and circulation improves.  The risk of having a heart attack, stroke,   cancer, or lung disease is greatly reduced.  After 1 year, the risk of coronary heart disease is cut in half.  After 5 years, the risk of stroke falls to the same as a nonsmoker.  After 10 years, the risk of lung cancer is cut in half and the risk of other cancers decreases significantly.  After 15 years, the risk of coronary heart disease drops, usually to the level of a nonsmoker.  If you are pregnant, quitting smoking will improve your chances of having a healthy baby.  The people you live with, especially any children, will be healthier.  You will have extra money to spend on things other than cigarettes. QUESTIONS TO THINK ABOUT BEFORE ATTEMPTING TO QUIT You may want to talk about your answers with your health care  provider.  Why do you want to quit?  If you tried to quit in the past, what helped and what did not?  What will be the most difficult situations for you after you quit? How will you plan to handle them?  Who can help you through the tough times? Your family? Friends? A health care provider?  What pleasures do you get from smoking? What ways can you still get pleasure if you quit? Here are some questions to ask your health care provider:  How can you help me to be successful at quitting?  What medicine do you think would be best for me and how should I take it?  What should I do if I need more help?  What is smoking withdrawal like? How can I get information on withdrawal? GET READY  Set a quit date.  Change your environment by getting rid of all cigarettes, ashtrays, matches, and lighters in your home, car, or work. Do not let people smoke in your home.  Review your past attempts to quit. Think about what worked and what did not. GET SUPPORT AND ENCOURAGEMENT You have a better chance of being successful if you have help. You can get support in many ways.  Tell your family, friends, and coworkers that you are going to quit and need their support. Ask them not to smoke around you.  Get individual, group, or telephone counseling and support. Programs are available at local hospitals and health centers. Call your local health department for information about programs in your area.  Spiritual beliefs and practices may help some smokers quit.  Download a "quit meter" on your computer to keep track of quit statistics, such as how long you have gone without smoking, cigarettes not smoked, and money saved.  Get a self-help book about quitting smoking and staying off tobacco. LEARN NEW SKILLS AND BEHAVIORS  Distract yourself from urges to smoke. Talk to someone, go for a walk, or occupy your time with a task.  Change your normal routine. Take a different route to work. Drink tea  instead of coffee. Eat breakfast in a different place.  Reduce your stress. Take a hot bath, exercise, or read a book.  Plan something enjoyable to do every day. Reward yourself for not smoking.  Explore interactive web-based programs that specialize in helping you quit. GET MEDICINE AND USE IT CORRECTLY Medicines can help you stop smoking and decrease the urge to smoke. Combining medicine with the above behavioral methods and support can greatly increase your chances of successfully quitting smoking.  Nicotine replacement therapy helps deliver nicotine to your body without the negative effects and risks of smoking. Nicotine replacement therapy includes nicotine gum, lozenges,   inhalers, nasal sprays, and skin patches. Some may be available over-the-counter and others require a prescription.  Antidepressant medicine helps people abstain from smoking, but how this works is unknown. This medicine is available by prescription.  Nicotinic receptor partial agonist medicine simulates the effect of nicotine in your brain. This medicine is available by prescription. Ask your health care provider for advice about which medicines to use and how to use them based on your health history. Your health care provider will tell you what side effects to look out for if you choose to be on a medicine or therapy. Carefully read the information on the package. Do not use any other product containing nicotine while using a nicotine replacement product.  RELAPSE OR DIFFICULT SITUATIONS Most relapses occur within the first 3 months after quitting. Do not be discouraged if you start smoking again. Remember, most people try several times before finally quitting. You may have symptoms of withdrawal because your body is used to nicotine. You may crave cigarettes, be irritable, feel very hungry, cough often, get headaches, or have difficulty concentrating. The withdrawal symptoms are only temporary. They are strongest when you  first quit, but they will go away within 10-14 days. To reduce the chances of relapse, try to:  Avoid drinking alcohol. Drinking lowers your chances of successfully quitting.  Reduce the amount of caffeine you consume. Once you quit smoking, the amount of caffeine in your body increases and can give you symptoms, such as a rapid heartbeat, sweating, and anxiety.  Avoid smokers because they can make you want to smoke.  Do not let weight gain distract you. Many smokers will gain weight when they quit, usually less than 10 pounds. Eat a healthy diet and stay active. You can always lose the weight gained after you quit.  Find ways to improve your mood other than smoking. FOR MORE INFORMATION  www.smokefree.gov  Document Released: 03/11/2001 Document Revised: 08/01/2013 Document Reviewed: 06/26/2011 ExitCare Patient Information 2015 [employer]. This information is not intended to replace advice given to you by your health care provider. Make sure you discuss any questions you have with your health care provider.    Smoking Cessation, Tips for Success If you are ready to quit smoking, congratulations! You have chosen to help yourself be healthier. Cigarettes bring nicotine, tar, carbon monoxide, and other irritants into your body. Your lungs, heart, and blood vessels will be able to work better without these poisons. There are many different ways to quit smoking. Nicotine gum, nicotine patches, a nicotine inhaler, or nicotine nasal spray can help with physical craving. Hypnosis, support groups, and medicines help break the habit of smoking. WHAT THINGS CAN I DO TO MAKE QUITTING EASIER?  Here are some tips to help you quit for good:  Pick a date when you will quit smoking completely. Tell all of your friends and family about your plan to quit on that date.  Do not try to slowly cut down on the number of cigarettes you are smoking. Pick a quit date and quit smoking completely starting on that  day.  Throw away all cigarettes.   Clean and remove all ashtrays from your home, work, and car.  On a card, write down your reasons for quitting. Carry the card with you and read it when you get the urge to smoke.  Cleanse your body of nicotine. Drink enough water and fluids to keep your urine clear or pale yellow. Do this after quitting to flush the nicotine from   your body.  Learn to predict your moods. Do not let a bad situation be your excuse to have a cigarette. Some situations in your life might tempt you into wanting a cigarette.  Never have "just one" cigarette. It leads to wanting another and another. Remind yourself of your decision to quit.  Change habits associated with smoking. If you smoked while driving or when feeling stressed, try other activities to replace smoking. Stand up when drinking your coffee. Brush your teeth after eating. Sit in a different chair when you read the paper. Avoid alcohol while trying to quit, and try to drink fewer caffeinated beverages. Alcohol and caffeine may urge you to smoke.  Avoid foods and drinks that can trigger a desire to smoke, such as sugary or spicy foods and alcohol.  Ask people who smoke not to smoke around you.  Have something planned to do right after eating or having a cup of coffee. For example, plan to take a walk or exercise.  Try a relaxation exercise to calm you down and decrease your stress. Remember, you may be tense and nervous for the first 2 weeks after you quit, but this will pass.  Find new activities to keep your hands busy. Play with a pen, coin, or rubber band. Doodle or draw things on paper.  Brush your teeth right after eating. This will help cut down on the craving for the taste of tobacco after meals. You can also try mouthwash.   Use oral substitutes in place of cigarettes. Try using lemon drops, carrots, cinnamon sticks, or chewing gum. Keep them handy so they are available when you have the urge to  smoke.  When you have the urge to smoke, try deep breathing.  Designate your home as a nonsmoking area.  If you are a heavy smoker, ask your health care provider about a prescription for nicotine chewing gum. It can ease your withdrawal from nicotine.  Reward yourself. Set aside the cigarette money you save and buy yourself something nice.  Look for support from others. Join a support group or smoking cessation program. Ask someone at home or at work to help you with your plan to quit smoking.  Always ask yourself, "Do I need this cigarette or is this just a reflex?" Tell yourself, "Today, I choose not to smoke," or "I do not want to smoke." You are reminding yourself of your decision to quit.  Do not replace cigarette smoking with electronic cigarettes (commonly called e-cigarettes). The safety of e-cigarettes is unknown, and some may contain harmful chemicals.  If you relapse, do not give up! Plan ahead and think about what you will do the next time you get the urge to smoke. HOW WILL I FEEL WHEN I QUIT SMOKING? You may have symptoms of withdrawal because your body is used to nicotine (the addictive substance in cigarettes). You may crave cigarettes, be irritable, feel very hungry, cough often, get headaches, or have difficulty concentrating. The withdrawal symptoms are only temporary. They are strongest when you first quit but will go away within 10-14 days. When withdrawal symptoms occur, stay in control. Think about your reasons for quitting. Remind yourself that these are signs that your body is healing and getting used to being without cigarettes. Remember that withdrawal symptoms are easier to treat than the major diseases that smoking can cause.  Even after the withdrawal is over, expect periodic urges to smoke. However, these cravings are generally short lived and will go away whether you   smoke or not. Do not smoke! WHAT RESOURCES ARE AVAILABLE TO HELP ME QUIT SMOKING? Your health care  provider can direct you to community resources or hospitals for support, which may include:  Group support.  Education.  Hypnosis.  Therapy. Document Released: 12/14/2003 Document Revised: 08/01/2013 Document Reviewed: 09/02/2012 ExitCare Patient Information 2015 [employer]. This information is not intended to replace advice given to you by your health care provider. Make sure you discuss any questions you have with your health care provider.  

## 2014-03-09 ENCOUNTER — Encounter (HOSPITAL_COMMUNITY): Payer: Self-pay | Admitting: Vascular Surgery

## 2014-07-04 ENCOUNTER — Ambulatory Visit (HOSPITAL_COMMUNITY): Payer: Medicaid Other | Admitting: Physical Therapy

## 2014-07-20 ENCOUNTER — Ambulatory Visit (HOSPITAL_COMMUNITY): Payer: Medicaid Other | Attending: Orthopedic Surgery | Admitting: Physical Therapy

## 2014-07-20 DIAGNOSIS — R262 Difficulty in walking, not elsewhere classified: Secondary | ICD-10-CM

## 2014-07-20 DIAGNOSIS — Z9889 Other specified postprocedural states: Secondary | ICD-10-CM | POA: Diagnosis not present

## 2014-07-20 DIAGNOSIS — M25662 Stiffness of left knee, not elsewhere classified: Secondary | ICD-10-CM | POA: Diagnosis not present

## 2014-07-20 DIAGNOSIS — M25562 Pain in left knee: Secondary | ICD-10-CM | POA: Diagnosis not present

## 2014-07-20 NOTE — Therapy (Addendum)
[facility] [place], [place], [address] Phone: [phone]   Fax:  [phone]  Physical Therapy Evaluation  Patient Details  Name: Brady Bell MRN: [id number] Date of Birth: [birth year] Referring Provider:  Renette Butters, MD  Encounter Date: 07/20/2014      PT End of Session - 07/20/14 1343    Visit Number 1   Number of Visits 16   Date for PT Re-Evaluation 08/19/14   Authorization Type medicaid   Authorization - Visit Number 1   Authorization - Number of Visits 16   PT Start Time 1300   PT Stop Time 1345   PT Time Calculation (min) 45 min   Activity Tolerance Patient tolerated treatment well   Behavior During Therapy [facility] for tasks assessed/performed      Past Medical History  Diagnosis Date  . Migraines   . Hypoglycemia   . Seizures   . Chronic knee pain   . Chronic ankle pain   . Peripheral vascular disease     Past Surgical History  Procedure Laterality Date  . Femoral-tibial bypass graft  09/06/2011    Procedure: BYPASS GRAFT FEMORAL-TIBIAL ARTERY;  Surgeon: Conrad Hydaburg, MD;  Location: [facility];  Service: Vascular;  Laterality: Left;  . Fasciotomy  09/06/2011    Procedure: FASCIOTOMY;  Surgeon: Conrad Swisher, MD;  Location: [facility];  Service: Vascular;  Laterality: Left;  . Pr vein bypass graft,aorto-fem-pop  09/06/11    Left   . Tooth extraction  June-July-Aug. 2015    Several   . Lower extremity angiogram N/A 09/05/2011    Procedure: LOWER EXTREMITY ANGIOGRAM;  Surgeon: Elam Dutch, MD;  Location[facility] C[facility] LAB;  Service: Cardiovascular;  Laterality: N/A;  . Lower extremity angiogram Left 04/15/2012    Procedure: LOWER EXTREMITY ANGIOGRAM;  Surgeon: Conrad Mabel, MD;  Location: [facility] CATH LAB;  Service: Cardiovascular;  Laterality: Left;    There were no vitals filed for this visit.  Visit Diagnosis:  S/P left knee arthroscopy - Plan: PT plan of care cert/re-cert  Left knee pain - Plan: PT plan of care  cert/re-cert  Knee stiffness, left - Plan: PT plan of care cert/re-cert  Difficulty walking - Plan: PT plan of care cert/re-cert      Subjective Assessment - 07/20/14 1305    Subjective Lt knee pain at rest. quickness to fatigue with walking.    Pertinent History 05/11/14 Lt knee chondroplasty. patien twas originially scheduled prior to that on 06/30/14 but unable to arrive at therapy due to transportation limitations.Patient was ambulatign iwith crutches and was NWBing for first 5 weeks followign surgery. Patient has since been walking in home and not perofrming stairs. Patinet was ambulating with cain prior to surgery secodnary to arterial bypass resulting in limited ability to walk. patient currently only performing light house work. Patient states falling 1 week after surgery in apartment.    How long can you walk comfortably? < 15 minutes with crutch or cain   Currently in Pain? Yes   Pain Score 5    Pain Location Knee   Pain Orientation Left;Medial   Pain Descriptors / Indicators Aching   Pain Type Surgical pain   Pain Onset More than a month ago   Pain Frequency Constant   Aggravating Factors  weight bearing and difficulty walking   Pain Relieving Factors elevation,    Effect of Pain on Daily Activities unable to erform more than light house work.  [facility] PT Assessment - 07/20/14 0001    Assessment   Medical Diagnosis Lt knee arthroscopy.    Onset Date 05/11/14   Next MD Visit T. Percell Miller   Restrictions   Weight Bearing Restrictions No   Balance Screen   Has the patient fallen in the past 6 months Yes   How many times? 1   Has the patient had a decrease in activity level because of a fear of falling?  Yes   Is the patient reluctant to leave their home because of a fear of falling?  Yes   Other:   Other/ Comments Gait: Bilateral excessive toe out, limited ability to toe in, excessive Lt medial lateral vairiability in knee motion, limited ability to toe in,     Other:   Other/Comments 3D hip excursions: liimited all 3 planes   ROM / Strength   AROM / PROM / Strength AROM;Strength   AROM   AROM Assessment Site Knee;Hip   Right Hip Internal Rotation  20   Left Hip Internal Rotation  10   Right/Left Knee Left   Left Knee Extension 0   Left Knee Flexion 100   Strength   Strength Assessment Site Knee   Right/Left Knee Left   Left Knee Flexion 4-/5   Left Knee Extension 4+/5   Palpation   Palpation WNL on Lt Knee            PT Education - 07/20/14 1342    Education provided Yes   Education Details diagnosis, Prognosis, HEP.    Person(s) Educated Patient   Methods Explanation;Demonstration   Comprehension Verbalized understanding;Returned demonstration          PT Short Term Goals - 07/20/14 1401    PT SHORT TERM GOAL #1   Title Patint will be able to walk with normalized gait mechanincs without an assistive device and pain <3/10   Time 4   Period Weeks   Status New   PT SHORT TERM GOAL #2   Title Patient will be bale to flex knee >120 degrees to be able to squat to floor to work in home.    Time 4   Period Weeks   Status New   PT SHORT TERM GOAL #3   Title Patient will be able to demonstrate improved hamstring strength to 4/5 MMT to be bale to amulatre up and down stairs with 2 HHA   Time 4   Period Weeks   Status New   PT SHORT TERM GOAL #4   Title Indpenendent with HEP.   Time 4   Period Weeks   Status New           PT Long Term Goals - 07/20/14 1403    PT LONG TERM GOAL #1   Title Patint will be able to walk with normalized gait mechanincs >21mnutes without pain.    Time 8   Period Weeks   Status New   PT LONG TERM GOAL #2   Title Patient will dmoenstrate increased knee flexion and extension strength to 5/5 MMT to be able to ambualte up and down steps wihtou UE assistance.    Time 8   Period Weeks   Status New   PT LONG TERM GOAL #3   Title patient will demonstrate improved hip internal rotaiton to 35  degrees bilaterally to ambualte with improved/degcreased knee valgus moment.    Time 8   Period Weeks   Status New  Plan - 07/20/14 1343    Clinical Impression Statement Patient displasy Lt knee stiffness and weakness s/p arthroscopic surgery resultign in difficulty walking and Lt knee pain. patint will benefit from skilled physical therapy to increasereturn to normal walking without pain. Patien tis unable to afford therapy and awating medicaid authorization.    Pt will benefit from skilled therapeutic intervention in order to improve on the following deficits Abnormal gait;Decreased endurance;Improper body mechanics;Decreased strength;Impaired flexibility;Pain;Difficulty walking;Decreased mobility;Decreased balance;Decreased range of motion   Rehab Potential Good   PT Frequency 2x / week   PT Duration 8 weeks   PT Treatment/Interventions Gait training;Functional mobility training;Patient/family education;Therapeutic activities;Therapeutic exercise;Manual techniques;Balance training;Stair training   PT Next Visit Plan Assuming patient recieves authorization for continued therapy patient to perform gait training exercises and stretches to normalize gait mechaincs to decreased Lt knee medial pain.    PT Home Exercise Plan 3D hip excursions, calf stretch, 3 way hamstring stretch, hip flexor stretch, quad sets heel slide and bridges.    Consulted and Agree with Plan of Care Patient         Problem List Patient Active Problem List   Diagnosis Date Noted  . Pain in joint, lower leg 02/10/2014  . Numbness and tingling of left leg 12/16/2013  . Cramps of left lower extremity-Leg 12/16/2013  . Aftercare following surgery of the circulatory system, [place] 12/16/2013  . PVD (peripheral vascular disease) with claudication 05/07/2012  . Pain in limb 03/26/2012  . Pain, lower leg 12/26/2011  . Atherosclerosis of native arteries of the extremities with intermittent claudication  09/26/2011  . CLOSED FRACTURE OF UPPER END OF TIBIA 02/27/2010  . [facility] MEDIAL MENISCUS 02/27/2010   Devona Konig PT DPT [facility] [address] [place], [place], [address] Phone: [phone]   Fax:  [phone]    PHYSICAL THERAPY DISCHARGE SUMMARY  Visits from Start of Care: 1  Current functional level related to goals / functional outcomes: As above   Remaining deficits: As above    Education / Equipment: HEP  Plan: Patient agrees to discharge.  Patient goals were not met. Patient is being discharged due to not returning since the last visit.  ?????       Rayetta Humphrey, [place] CLT [phone]

## 2014-07-20 NOTE — Patient Instructions (Signed)
Antiemboli: Isometric   Pull toes toward left knee, tense muscles on front of thigh and simultaneously squeeze buttocks. Keep leg and buttock flat on floor. Hold 3 seconds. Repeat 10 times per set. Do 1 sets per session. Do 3 sessions per day.  http://orth.exer.us/708   Copyright  VHI. All rights reserved.   Bracing With Heel Slides (Supine)   With neutral spine, tighten pelvic floor and abdominals and hold. , slide heel to bottom. Repeat 10times. Do 3 times a day. Use a rope to assist bending knee.    Copyright  VHI. All rights reserved.   Bridge   Lie back, legs bent. Inhale, pressing hips up. Keeping ribs in, lengthen lower back. Exhale, rolling down along spine from top. Repeat 10 times. Do 2 sessions per day.  Copyright  VHI. All rights reserved.

## 2014-11-29 ENCOUNTER — Emergency Department (HOSPITAL_COMMUNITY)
Admission: EM | Admit: 2014-11-29 | Discharge: 2014-11-29 | Disposition: A | Discharge status: Home / Self Care | Payer: Medicaid Other | Attending: Emergency Medicine | Admitting: Emergency Medicine

## 2014-11-29 ENCOUNTER — Encounter (HOSPITAL_COMMUNITY): Payer: Self-pay | Admitting: *Deleted

## 2014-11-29 DIAGNOSIS — G8929 Other chronic pain: Secondary | ICD-10-CM | POA: Diagnosis not present

## 2014-11-29 DIAGNOSIS — G43909 Migraine, unspecified, not intractable, without status migrainosus: Secondary | ICD-10-CM | POA: Diagnosis not present

## 2014-11-29 DIAGNOSIS — Z720 Tobacco use: Secondary | ICD-10-CM | POA: Diagnosis not present

## 2014-11-29 DIAGNOSIS — Z79899 Other long term (current) drug therapy: Secondary | ICD-10-CM | POA: Diagnosis not present

## 2014-11-29 DIAGNOSIS — G40909 Epilepsy, unspecified, not intractable, without status epilepticus: Secondary | ICD-10-CM | POA: Diagnosis not present

## 2014-11-29 DIAGNOSIS — Z8679 Personal history of other diseases of the circulatory system: Secondary | ICD-10-CM | POA: Insufficient documentation

## 2014-11-29 DIAGNOSIS — M25561 Pain in right knee: Secondary | ICD-10-CM | POA: Insufficient documentation

## 2014-11-29 DIAGNOSIS — Z9889 Other specified postprocedural states: Secondary | ICD-10-CM | POA: Diagnosis not present

## 2014-11-29 DIAGNOSIS — Z8639 Personal history of other endocrine, nutritional and metabolic disease: Secondary | ICD-10-CM | POA: Diagnosis not present

## 2014-11-29 MED ORDER — KETOROLAC TROMETHAMINE 10 MG PO TABS
10.0000 mg | ORAL_TABLET | Freq: Once | ORAL | Status: AC
  Administered 2014-11-29: 10 mg via ORAL
  Filled 2014-11-29: qty 1

## 2014-11-29 MED ORDER — PREDNISONE 50 MG PO TABS
60.0000 mg | ORAL_TABLET | Freq: Once | ORAL | Status: AC
  Administered 2014-11-29: 60 mg via ORAL
  Filled 2014-11-29 (×2): qty 1

## 2014-11-29 MED ORDER — DICLOFENAC SODIUM 75 MG PO TBEC: 75.0000 mg | DELAYED_RELEASE_TABLET | Freq: Two times a day (BID) | ORAL | Status: DC

## 2014-11-29 MED ORDER — PREDNISONE 10 MG PO TABS: ORAL_TABLET | ORAL | Status: DC

## 2014-11-29 MED ORDER — ONDANSETRON HCL 4 MG PO TABS
4.0000 mg | ORAL_TABLET | Freq: Once | ORAL | Status: AC
  Administered 2014-11-29: 4 mg via ORAL
  Filled 2014-11-29: qty 1

## 2014-11-29 NOTE — ED Notes (Signed)
Left knee pain started back x 1 week. Pt states he had surgery to the knee in February and it now feels like it did before the surgery. Unable to get in with ortho at this time.

## 2014-11-29 NOTE — ED Provider Notes (Signed)
CSN: [id number]     Arrival date & time 11/29/14  1439 History   First MD Initiated Contact with Patient 11/29/14 1511     Chief Complaint  Patient presents with  . Knee Pain     (Consider location/radiation/quality/duration/timing/severity/associated sxs/prior Treatment) HPI Comments: Patient is a 59 year old male who presents to the emergency department with complaint of left knee pain.  The patient states that in February of this year he had surgery on his knee. Approximately one week ago the pain in the left knee started hurting him again. He states that it feels much like the pain did before surgery in February. It is of note that the patient has some chronic vascular problems and has required femoral-popliteal bypass graft in the past. He has not had any high fever. He has pain when he attempts to walk on the left lower extremity. He's not been any recent accidents. He has tried rest and elevation, but states these are not helping. He attempted to get an appointment with his with specialists, was unable to do so, so came to the emergency department for assistance with this discomfort.  Patient is a 59 y.o. male presenting with knee pain. The history is provided by the patient.  Knee Pain Location:  Knee   Past Medical History  Diagnosis Date  . Migraines   . Hypoglycemia   . Seizures   . Chronic knee pain   . Chronic ankle pain   . Peripheral vascular disease    Past Surgical History  Procedure Laterality Date  . Femoral-tibial bypass graft  09/06/2011    Procedure: BYPASS GRAFT FEMORAL-TIBIAL ARTERY;  Surgeon: Fransisco Hertz, MD;  Location: [facility] OR;  Service: Vascular;  Laterality: Left;  . Fasciotomy  09/06/2011    Procedure: FASCIOTOMY;  Surgeon: Fransisco Hertz, MD;  Location: [facility] OR;  Service: Vascular;  Laterality: Left;  . Pr vein bypass graft,aorto-fem-pop  09/06/11    Left   . Tooth extraction  June-July-Aug. 2015    Several   . Lower extremity angiogram N/A 09/05/2011   Procedure: LOWER EXTREMITY ANGIOGRAM;  Surgeon: Sherren Kerns, MD;  Location: [facility] CATH LAB;  Service: Cardiovascular;  Laterality: N/A;  . Lower extremity angiogram Left 04/15/2012    Procedure: LOWER EXTREMITY ANGIOGRAM;  Surgeon: Fransisco Hertz, MD;  Location: [facility] CATH LAB;  Service: Cardiovascular;  Laterality: Left;   Family History  Problem Relation Age of Onset  . Diabetes    . Alcohol abuse Father    Social History  Substance Use Topics  . Smoking status: Current Every Day Smoker -- 0.50 packs/day for 30 years    Types: Cigarettes  . Smokeless tobacco: Never Used     Comment: pt states that he is trying his best to quit  . Alcohol Use: Yes     Comment: 3 or 4 times a week    Review of Systems  Musculoskeletal: Positive for arthralgias.  Neurological: Positive for headaches.  All other systems reviewed and are negative.     Allergies  Review of patient's allergies indicates no known allergies.  Home Medications   Prior to Admission medications   Medication Sig Start Date End Date Taking? Authorizing Provider  alprazolam Prudy Feeler) 2 MG tablet Take 2 mg by mouth at bedtime.     Historical Provider, MD  cyclobenzaprine (FLEXERIL) 10 MG tablet Take 1 tablet (10 mg total) by mouth 2 (two) times daily as needed for muscle spasms. 05/24/13   Hope Orlene Och,  NP  gabapentin (NEURONTIN) 300 MG capsule Take 300 mg by mouth 3 (three) times daily.    Historical Provider, MD  HYDROcodone-acetaminophen (NORCO) 10-325 MG per tablet Take 1 tablet by mouth every 6 (six) hours as needed.    Historical Provider, MD  oxyCODONE-acetaminophen (PERCOCET/ROXICET) 5-325 MG per tablet Take 1 tablet by mouth every 6 (six) hours as needed for pain.    Historical Provider, MD  topiramate (TOPAMAX) 100 MG tablet Take 100 mg by mouth daily.     Historical Provider, MD   BP 149/106 mmHg  Pulse 105  Temp(Src) 98.1 F (36.7 C)  Resp 18  SpO2 98% Physical Exam  Constitutional: He is oriented to person,  place, and time. He appears well-developed and well-nourished.  Non-toxic appearance.  HENT:  Head: Normocephalic.  Right Ear: Tympanic membrane and external ear normal.  Left Ear: Tympanic membrane and external ear normal.  Eyes: EOM and lids are normal. Pupils are equal, round, and reactive to light.  Neck: Normal range of motion. Neck supple. Carotid bruit is not present.  Cardiovascular: Normal rate, regular rhythm, normal heart sounds, intact distal pulses and normal pulses.   Pulmonary/Chest: Breath sounds normal. No respiratory distress.  Abdominal: Soft. Bowel sounds are normal. There is no tenderness. There is no guarding.  Musculoskeletal: Normal range of motion.  There is pain and crepitus with attempted range of motion of the left knee. There is a good palpable pulse in the bypass graft of the left lower extremity. Left lower extremity is warm with good color. There is no posterior mass.  Lymphadenopathy:       Head (right side): No submandibular adenopathy present.       Head (left side): No submandibular adenopathy present.    He has no cervical adenopathy.  Neurological: He is alert and oriented to person, place, and time. He has normal strength. No cranial nerve deficit or sensory deficit.  Skin: Skin is warm and dry.  Psychiatric: He has a normal mood and affect. His speech is normal.  Nursing note and vitals reviewed.   ED Course  Procedures (including critical care time) Labs Review Labs Reviewed - No data to display  Imaging Review No results found. I have personally reviewed and evaluated these images and lab results as part of my medical decision-making.   EKG Interpretation None      MDM  Vital signs reviewed. I reviewed the findings on examination with the patient in terms which he understands. Questions were answered. Feel that it is safe for the patient be discharged home. Crutches were offered, but patient states he has a walking cane. Prescription for  diclofenac and Decadron given to the patient. He is to [facility].    Final diagnoses:  None    *I have reviewed nursing notes, vital signs, and all appropriate lab and imaging results for this patient.[address], PA-C 12/02/14 1610  Geoffery Lyons, MD 12/04/14 [phone]

## 2014-11-29 NOTE — Discharge Instructions (Signed)
I have reviewed the vital signs for today. The examination suggest exacerbation of arthritis, and/or inflammatory status. No joint effusion is appreciated at this time. Please see Dr. Eulah Pont concerning your knee. Please see Dr. Juanetta Gosling for pain management and control. Knee Pain The knee is the complex joint between your thigh and your lower leg. It is made up of bones, tendons, ligaments, and cartilage. The bones that make up the knee are:  The femur in the thigh.  The tibia and fibula in the lower leg.  The patella or kneecap riding in the groove on the lower femur. CAUSES  Knee pain is a common complaint with many causes. A few of these causes are:  Injury, such as:  A ruptured ligament or tendon injury.  Torn cartilage.  Medical conditions, such as:  Gout  Arthritis  Infections  Overuse, over training, or overdoing a physical activity. Knee pain can be minor or severe. Knee pain can accompany debilitating injury. Minor knee problems often respond well to self-care measures or get well on their own. More serious injuries may need medical intervention or even surgery. SYMPTOMS The knee is complex. Symptoms of knee problems can vary widely. Some of the problems are:  Pain with movement and weight bearing.  Swelling and tenderness.  Buckling of the knee.  Inability to straighten or extend your knee.  Your knee locks and you cannot straighten it.  Warmth and redness with pain and fever.  Deformity or dislocation of the kneecap. DIAGNOSIS  Determining what is wrong may be very straight forward such as when there is an injury. It can also be challenging because of the complexity of the knee. Tests to make a diagnosis may include:  Your caregiver taking a history and doing a physical exam.  Routine X-rays can be used to rule out other problems. X-rays will not reveal a cartilage tear. Some injuries of the knee can be diagnosed by:  Arthroscopy a surgical technique by  which a small video camera is inserted through tiny incisions on the sides of the knee. This procedure is used to examine and repair internal knee joint problems. Tiny instruments can be used during arthroscopy to repair the torn knee cartilage (meniscus).  Arthrography is a radiology technique. A contrast liquid is directly injected into the knee joint. Internal structures of the knee joint then become visible on X-ray film.  An MRI scan is a non X-ray radiology procedure in which magnetic fields and a computer produce two- or three-dimensional images of the inside of the knee. Cartilage tears are often visible using an MRI scanner. MRI scans have largely replaced arthrography in diagnosing cartilage tears of the knee.  Blood work.  Examination of the fluid that helps to lubricate the knee joint (synovial fluid). This is done by taking a sample out using a needle and a syringe. TREATMENT The treatment of knee problems depends on the cause. Some of these treatments are:  Depending on the injury, proper casting, splinting, surgery, or physical therapy care will be needed.  Give yourself adequate recovery time. Do not overuse your joints. If you begin to get sore during workout routines, back off. Slow down or do fewer repetitions.  For repetitive activities such as cycling or running, maintain your strength and nutrition.  Alternate muscle groups. For example, if you are a weight lifter, work the upper body on one day and the lower body the next.  Either tight or weak muscles do not give the proper support for  your knee. Tight or weak muscles do not absorb the stress placed on the knee joint. Keep the muscles surrounding the knee strong.  Take care of mechanical problems.  If you have flat feet, orthotics or special shoes may help. See your caregiver if you need help.  Arch supports, sometimes with wedges on the inner or outer aspect of the heel, can help. These can shift pressure away from  the side of the knee most bothered by osteoarthritis.  A brace called an "unloader" brace also may be used to help ease the pressure on the most arthritic side of the knee.  If your caregiver has prescribed crutches, braces, wraps or ice, use as directed. The acronym for this is PRICE. This means protection, rest, ice, compression, and elevation.  Nonsteroidal anti-inflammatory drugs (NSAIDs), can help relieve pain. But if taken immediately after an injury, they may actually increase swelling. Take NSAIDs with food in your stomach. Stop them if you develop stomach problems. Do not take these if you have a history of ulcers, stomach pain, or bleeding from the bowel. Do not take without your caregiver's approval if you have problems with fluid retention, heart failure, or kidney problems.  For ongoing knee problems, physical therapy may be helpful.  Glucosamine and chondroitin are over-the-counter dietary supplements. Both may help relieve the pain of osteoarthritis in the knee. These medicines are different from the usual anti-inflammatory drugs. Glucosamine may decrease the rate of cartilage destruction.  Injections of a corticosteroid drug into your knee joint may help reduce the symptoms of an arthritis flare-up. They may provide pain relief that lasts a few months. You may have to wait a few months between injections. The injections do have a small increased risk of infection, water retention, and elevated blood sugar levels.  Hyaluronic acid injected into damaged joints may ease pain and provide lubrication. These injections may work by reducing inflammation. A series of shots may give relief for as long as 6 months.  Topical painkillers. Applying certain ointments to your skin may help relieve the pain and stiffness of osteoarthritis. Ask your pharmacist for suggestions. Many over the-counter products are approved for temporary relief of arthritis pain.  In some countries, doctors often  prescribe topical NSAIDs for relief of chronic conditions such as arthritis and tendinitis. A review of treatment with NSAID creams found that they worked as well as oral medications but without the serious side effects. PREVENTION  Maintain a healthy weight. Extra pounds put more strain on your joints.  Get strong, stay limber. Weak muscles are a common cause of knee injuries. Stretching is important. Include flexibility exercises in your workouts.  Be smart about exercise. If you have osteoarthritis, chronic knee pain or recurring injuries, you may need to change the way you exercise. This does not mean you have to stop being active. If your knees ache after jogging or playing basketball, consider switching to swimming, water aerobics, or other low-impact activities, at least for a few days a week. Sometimes limiting high-impact activities will provide relief.  Make sure your shoes fit well. Choose footwear that is right for your sport.  Protect your knees. Use the proper gear for knee-sensitive activities. Use kneepads when playing volleyball or laying carpet. Buckle your seat belt every time you drive. Most shattered kneecaps occur in car accidents.  Rest when you are tired. SEEK MEDICAL CARE IF:  You have knee pain that is continual and does not seem to be getting better.  SEEK IMMEDIATE  MEDICAL CARE IF:  Your knee joint feels hot to the touch and you have a high fever. MAKE SURE YOU:   Understand these instructions.  Will watch your condition.  Will get help right away if you are not doing well or get worse. Document Released: 01/12/2007 Document Revised: 06/09/2011 Document Reviewed: 01/12/2007 [facility] Patient Information 2015 [place], [place]. This information is not intended to replace advice given to you by your health care provider. Make sure you discuss any questions you have with your health care provider.

## 2014-12-20 ENCOUNTER — Encounter: Payer: Self-pay | Admitting: Family

## 2014-12-22 ENCOUNTER — Other Ambulatory Visit (HOSPITAL_COMMUNITY): Payer: Medicaid Other

## 2014-12-22 ENCOUNTER — Encounter (HOSPITAL_COMMUNITY): Payer: Medicaid Other

## 2014-12-22 ENCOUNTER — Ambulatory Visit (INDEPENDENT_AMBULATORY_CARE_PROVIDER_SITE_OTHER): Payer: Medicaid Other | Admitting: Family

## 2014-12-22 ENCOUNTER — Encounter: Payer: Self-pay | Admitting: Family

## 2014-12-22 VITALS — BP 136/90 | HR 56 | Temp 97.1°F | Resp 14 | Ht 70.0 in | Wt 131.0 lb

## 2014-12-22 DIAGNOSIS — Z9889 Other specified postprocedural states: Secondary | ICD-10-CM | POA: Diagnosis not present

## 2014-12-22 DIAGNOSIS — Z95828 Presence of other vascular implants and grafts: Secondary | ICD-10-CM

## 2014-12-22 DIAGNOSIS — Z720 Tobacco use: Secondary | ICD-10-CM

## 2014-12-22 DIAGNOSIS — Z4889 Encounter for other specified surgical aftercare: Secondary | ICD-10-CM | POA: Diagnosis not present

## 2014-12-22 DIAGNOSIS — F17200 Nicotine dependence, unspecified, uncomplicated: Secondary | ICD-10-CM

## 2014-12-22 DIAGNOSIS — Z48812 Encounter for surgical aftercare following surgery on the circulatory system: Secondary | ICD-10-CM

## 2014-12-22 DIAGNOSIS — I779 Disorder of arteries and arterioles, unspecified: Secondary | ICD-10-CM | POA: Diagnosis not present

## 2014-12-22 NOTE — Progress Notes (Signed)
[facility] [place] HISTORY AND PHYSICAL -PAD  History of Present Illness Brady Bell is a 59 y.o. male patient of Dr. Imogene Burn who is s/p left common femoral artery to anterior tibial artery bypass with non-reversed ipslateral greater saphenous vein (Date: 09/06/11).  He returns today for routine follow up and non invasive vascular lab testing, but his insurance denied ABI's and left LE arterial duplex for today.  He had been homeless for about 2 years, but is now living in an apt.  He is trying to get his teeth addressed with dental care. He now has disability status.  He denies claudication symptoms, denies non healing wounds, his left knee has a medial meniscus tear and he needs surgical repair, he wears a brace on his knee.  He had a left knee arthroscopic procedure in February 2016, states subsequently he needs another left knee arthroscopic procedure, Dr. Renaye Rakers. Pt reports considerable discomfort in his left knee.  He denies history of stroke or TIA.  Pt Diabetic: No  Pt smoker: smoker (1/2 ppd x since age 80 with several periods of abstinence)   Pt meds include:  Statin :No, states his cholesterol is good  ASA: Yes  Other anticoagulants/antiplatelets: no    Past Medical History  Diagnosis Date  . Migraines   . Hypoglycemia   . Seizures   . Chronic knee pain   . Chronic ankle pain   . Peripheral vascular disease     Social History Social History  Substance Use Topics  . Smoking status: Current Every Day Smoker -- 0.50 packs/day for 30 years    Types: Cigarettes  . Smokeless tobacco: Never Used     Comment: pt states that he is trying his best to quit  . Alcohol Use: Yes     Comment: 3 or 4 times a week    Family History Family History  Problem Relation Age of Onset  . Diabetes    . Alcohol abuse Father     Past Surgical History  Procedure Laterality Date  . Femoral-tibial bypass graft  09/06/2011    Procedure: BYPASS GRAFT  FEMORAL-TIBIAL ARTERY;  Surgeon: Fransisco Hertz, MD;  Location: [facility] OR;  Service: Vascular;  Laterality: Left;  . Fasciotomy  09/06/2011    Procedure: FASCIOTOMY;  Surgeon: Fransisco Hertz, MD;  Location: [facility] OR;  Service: Vascular;  Laterality: Left;  . Pr vein bypass graft,aorto-fem-pop  09/06/11    Left   . Tooth extraction  June-July-Aug. 2015    Several   . Lower extremity angiogram N/A 09/05/2011    Procedure: LOWER EXTREMITY ANGIOGRAM;  Surgeon: Sherren Kerns, MD;  Location: [facility] CATH LAB;  Service: Cardiovascular;  Laterality: N/A;  . Lower extremity angiogram Left 04/15/2012    Procedure: LOWER EXTREMITY ANGIOGRAM;  Surgeon: Fransisco Hertz, MD;  Location: [facility] CATH LAB;  Service: Cardiovascular;  Laterality: Left;    No Known Allergies  Current Outpatient Prescriptions  Medication Sig Dispense Refill  . alprazolam (XANAX) 2 MG tablet Take 2 mg by mouth at bedtime.     . cyclobenzaprine (FLEXERIL) 10 MG tablet Take 1 tablet (10 mg total) by mouth 2 (two) times daily as needed for muscle spasms. 20 tablet 0  . diclofenac (VOLTAREN) 75 MG EC tablet Take 1 tablet (75 mg total) by mouth 2 (two) times daily. 14 tablet 0  . gabapentin (NEURONTIN) 300 MG capsule Take 300 mg by mouth 3 (three) times daily.    Marland Kitchen  HYDROcodone-acetaminophen (NORCO) 10-325 MG per tablet Take 1 tablet by mouth every 6 (six) hours as needed.    Marland Kitchen oxyCODONE-acetaminophen (PERCOCET/ROXICET) 5-325 MG per tablet Take 1 tablet by mouth every 6 (six) hours as needed for pain.    . predniSONE (DELTASONE) 10 MG tablet 5,4,3,2,1 - take with food 15 tablet 0  . topiramate (TOPAMAX) 100 MG tablet Take 100 mg by mouth daily.      No current facility-administered medications for this visit.    ROS: See HPI for pertinent positives and negatives.   Physical Examination  Filed Vitals:   12/22/14 1221  BP: 136/90  Pulse: 56  Temp: 97.1 F (36.2 C)  Resp: 14  Height:  (1.778 m)  Weight: 131 lb (59.421 kg)  SpO2: 99%   Body  mass index is 18.8 kg/(m^2).   General: A&O x 3, WDWN. Thin male. Gait: antalgic, limping, uses a cane  Eyes: PERRLA.  Pulmonary: CTAB but somewhat limited air movement, without wheezes , rales or rhonchi.  Cardiac: regular rythm, no detected murmur.   Carotid Bruits  Left  Right    Negative  Negative    Aorta is not palpable.  Radial pulses: are 2+ palpable and =   VASCULAR EXAM:  Extremities without ischemic changes  without Gangrene; without open wounds. No peripheral edema.   LE Pulses  LEFT  RIGHT   FEMORAL  palpable  palpable   POPLITEAL  graft palpable laterally  not palpable   POSTERIOR TIBIAL  not palpable  2+palpable   DORSALIS PEDIS  ANTERIOR TIBIAL  1+palpable  1+palpable    Abdomen: soft, NT, no palpable masses.  Skin: no rashes, no ulcers.  Musculoskeletal: Small but noticeable degree of left leg muscle wasting or atrophy. No signs of ischemia in extremities.  Neurologic: A&O X 3; Appropriate Affect,  MOTOR FUNCTION: moving all extremities equally, motor strength 5/5 throughout except 4/5 in left lower extremity. Speech is fluent/normal. CN 2-12 intact.           ASSESSMENT: Brady Bell is a 59 y.o. male who is s/p left common femoral artery to anterior tibial artery bypass with non-reversed ipslateral greater saphenous vein (Date: 09/06/11).  Pt does not seem to have claudication symptoms with walking, he has no signs of ischemia in his lower extremities. His chief complaint at this time is left knee pain.   Pt states that he had left knee arthroscopic surgery in February this year and needs another left knee arthroscopic procedure.  I discussed with Dr. Imogene Burn whether the use of a tourniquet on his left leg is contraindicated and it is not contraindicated since pt is thin,  pt's left leg arterial bypass graft is lateral and readily palpable; graft shut down would be readily noticeable. Pt should be  instructed re his left knee arthroscopy to let us know should he no longer be able to feel his left leg lateral graft pulse or he has concerns re the circulation in his legs. His insurance denied coverage of non invasive vascular lab testing today. Pt's primary atherosclerotic risk factor remains smoking. Fortunately he does not have DM.  The Society for Vascular Surgery surveillance guideline for lower extremity bypass graft is yearly surveillance 2 years and later after the operation by ABI's and duplex ultrasound of the entire lower extremity. See Plan.   PLAN:  The patient was counseled re smoking cessation and given several free resources re smoking cessation.  Based on the patient's vascular studies and  examination, pt will return to clinic in 2 months with ABI's and left LE arterial duplex if his insurance approves coverage.   I discussed in depth with the patient the nature of atherosclerosis, and emphasized the importance of maximal medical management including strict control of blood pressure, blood glucose, and lipid levels, obtaining regular exercise, and cessation of smoking.  The patient is aware that without maximal medical management the underlying atherosclerotic disease process will progress, limiting the benefit of any interventions.  The patient was given information about PAD including signs, symptoms, treatment, what symptoms should prompt the patient to seek immediate medical care, and risk reduction measures to take.  Charisse March, RN, MSN, FNP-C [facility] [place] Phone: [phone]  Clinic MD: Imogene Burn  12/22/2014 12:22 PM

## 2014-12-22 NOTE — Patient Instructions (Signed)

## 2014-12-25 NOTE — Addendum Note (Signed)
Addended by: Adria Dill L on: 12/25/2014 09:33 AM   Modules accepted: Orders

## 2015-01-05 ENCOUNTER — Emergency Department (HOSPITAL_COMMUNITY)
Admission: EM | Admit: 2015-01-05 | Discharge: 2015-01-05 | Disposition: A | Discharge status: Home / Self Care | Payer: Medicaid Other | Attending: Emergency Medicine | Admitting: Emergency Medicine

## 2015-01-05 ENCOUNTER — Encounter (HOSPITAL_COMMUNITY): Payer: Self-pay | Admitting: Nurse Practitioner

## 2015-01-05 DIAGNOSIS — K644 Residual hemorrhoidal skin tags: Secondary | ICD-10-CM | POA: Insufficient documentation

## 2015-01-05 DIAGNOSIS — Z720 Tobacco use: Secondary | ICD-10-CM | POA: Insufficient documentation

## 2015-01-05 DIAGNOSIS — G40909 Epilepsy, unspecified, not intractable, without status epilepticus: Secondary | ICD-10-CM | POA: Insufficient documentation

## 2015-01-05 DIAGNOSIS — Z8679 Personal history of other diseases of the circulatory system: Secondary | ICD-10-CM | POA: Insufficient documentation

## 2015-01-05 DIAGNOSIS — N398 Other specified disorders of urinary system: Secondary | ICD-10-CM | POA: Diagnosis present

## 2015-01-05 DIAGNOSIS — G8929 Other chronic pain: Secondary | ICD-10-CM | POA: Diagnosis not present

## 2015-01-05 DIAGNOSIS — G43909 Migraine, unspecified, not intractable, without status migrainosus: Secondary | ICD-10-CM | POA: Insufficient documentation

## 2015-01-05 DIAGNOSIS — Z79899 Other long term (current) drug therapy: Secondary | ICD-10-CM | POA: Insufficient documentation

## 2015-01-05 DIAGNOSIS — R319 Hematuria, unspecified: Secondary | ICD-10-CM | POA: Diagnosis not present

## 2015-01-05 LAB — POC OCCULT BLOOD, ED: Fecal Occult Bld: NEGATIVE

## 2015-01-05 LAB — URINALYSIS, ROUTINE W REFLEX MICROSCOPIC
Bilirubin Urine: NEGATIVE
GLUCOSE, UA: NEGATIVE mg/dL
Ketones, ur: NEGATIVE mg/dL
Leukocytes, UA: NEGATIVE
Nitrite: NEGATIVE
PH: 6 (ref 5.0–8.0)
Protein, ur: NEGATIVE mg/dL
SPECIFIC GRAVITY, URINE: 1.008 (ref 1.005–1.030)
Urobilinogen, UA: 0.2 mg/dL (ref 0.0–1.0)

## 2015-01-05 LAB — CBC
HEMATOCRIT: 49.5 % (ref 39.0–52.0)
HEMOGLOBIN: 16.9 g/dL (ref 13.0–17.0)
MCH: 34.3 pg — AB (ref 26.0–34.0)
MCHC: 34.1 g/dL (ref 30.0–36.0)
MCV: 100.4 fL — ABNORMAL HIGH (ref 78.0–100.0)
Platelets: 182 10*3/uL (ref 150–400)
RBC: 4.93 MIL/uL (ref 4.22–5.81)
RDW: 15.8 % — ABNORMAL HIGH (ref 11.5–15.5)
WBC: 7.1 10*3/uL (ref 4.0–10.5)

## 2015-01-05 LAB — BASIC METABOLIC PANEL
Anion gap: 9 (ref 5–15)
BUN: 9 mg/dL (ref 6–20)
CHLORIDE: 103 mmol/L (ref 101–111)
CO2: 24 mmol/L (ref 22–32)
CREATININE: 1.02 mg/dL (ref 0.61–1.24)
Calcium: 9.1 mg/dL (ref 8.9–10.3)
GFR calc Af Amer: >60 mL/min (ref >60)
GFR calc non Af Amer: >60 mL/min (ref >60)
Glucose, Bld: 110 mg/dL — ABNORMAL HIGH (ref 65–99)
Potassium: 4.1 mmol/L (ref 3.5–5.1)
SODIUM: 136 mmol/L (ref 135–145)

## 2015-01-05 LAB — URINE MICROSCOPIC-ADD ON

## 2015-01-05 MED ORDER — OXYCODONE-ACETAMINOPHEN 5-325 MG PO TABS
1.0000 | ORAL_TABLET | Freq: Once | ORAL | Status: AC
  Administered 2015-01-05: 1 via ORAL
  Filled 2015-01-05: qty 1

## 2015-01-05 NOTE — ED Provider Notes (Signed)
CSN: [id number]     Arrival date & time 01/05/15  1119 History   First MD Initiated Contact with Patient 01/05/15 1140     Chief Complaint  Patient presents with  . Fall    HPI   Brady Bell is a 59 y.o. male with a PMH of left knee pain s/p femoral-tibial bypass 2013 who presents to the ED with hematuria s/p fall. He states his left knee "gave out" on him on Tuesday, causing him to fall, at which time he knocked over his "makeshift" coffee table and landed on his buttocks on a cigarette tray. He denies hitting his head or LOC. He reports intermittent pain to his buttocks, which is exacerbated by movement and relieved by pain medication. He denies neck pain, back pain, extremity pain, numbness, paresthesia, fever, chills, chest pain, lightheadedness, dizziness, shortness of breath, abdominal pain, N/V/D/C, dysuria, urgency, frequency. He states his urine has been "pink" since his fall, and he states when he ejaculated, he noticed blood. He denies penile discharge, pain, or swelling. He denies testicular pain or swelling. He reports this has never happened to him before. He was seen today for recheck of his left knee, and was sent to the ED for further evaluation of hematuria.   Past Medical History  Diagnosis Date  . Migraines   . Hypoglycemia   . Seizures (HCC)   . Chronic knee pain   . Chronic ankle pain   . Peripheral vascular disease [facility])    Past Surgical History  Procedure Laterality Date  . Femoral-tibial bypass graft  09/06/2011    Procedure: BYPASS GRAFT FEMORAL-TIBIAL ARTERY;  Surgeon: Fransisco Hertz, MD;  Location: [facility] OR;  Service: Vascular;  Laterality: Left;  . Fasciotomy  09/06/2011    Procedure: FASCIOTOMY;  Surgeon: Fransisco Hertz, MD;  Location: [facility] OR;  Service: Vascular;  Laterality: Left;  . Pr vein bypass graft,aorto-fem-pop  09/06/11    Left   . Tooth extraction  June-July-Aug. 2015    Several   . Lower extremity angiogram N/A 09/05/2011    Procedure: LOWER EXTREMITY  ANGIOGRAM;  Surgeon: Sherren Kerns, MD;  Location: [facility] CATH LAB;  Service: Cardiovascular;  Laterality: N/A;  . Lower extremity angiogram Left 04/15/2012    Procedure: LOWER EXTREMITY ANGIOGRAM;  Surgeon: Fransisco Hertz, MD;  Location: [facility] CATH LAB;  Service: Cardiovascular;  Laterality: Left;   Family History  Problem Relation Age of Onset  . Diabetes    . Alcohol abuse Father    Social History  Substance Use Topics  . Smoking status: Light Tobacco Smoker -- 0.50 packs/day for 30 years    Types: Cigarettes  . Smokeless tobacco: Never Used     Comment: pt states that he is trying his best to quit  . Alcohol Use: 0.0 oz/week    0 Standard drinks or equivalent per week     Comment: 3 or 4 times a week     Review of Systems  Constitutional: Negative for fever and chills.  Respiratory: Negative for shortness of breath.   Cardiovascular: Negative for chest pain.  Gastrointestinal: Negative for nausea, vomiting, abdominal pain, diarrhea, constipation and blood in stool.  Genitourinary: Positive for hematuria. Negative for dysuria, urgency, discharge, penile swelling, scrotal swelling, difficulty urinating, penile pain and testicular pain.  Musculoskeletal: Negative for back pain and neck pain.  Neurological: Negative for dizziness, weakness, light-headedness and headaches.  All other systems reviewed and are negative.     Allergies  Review of patient's allergies indicates no known allergies.  Home Medications   Prior to Admission medications   Medication Sig Start Date End Date Taking? Authorizing Provider  alprazolam Prudy Feeler) 2 MG tablet Take 2 mg by mouth at bedtime.     Historical Provider, MD  cyclobenzaprine (FLEXERIL) 10 MG tablet Take 1 tablet (10 mg total) by mouth 2 (two) times daily as needed for muscle spasms. Patient not taking: Reported on 12/22/2014 05/24/13   Janne Napoleon, NP  diclofenac (VOLTAREN) 75 MG EC tablet Take 1 tablet (75 mg total) by mouth 2 (two) times  daily. Patient not taking: Reported on 12/22/2014 11/29/14   Ivery Quale, PA-C  gabapentin (NEURONTIN) 300 MG capsule Take 300 mg by mouth 3 (three) times daily.    Historical Provider, MD  HYDROcodone-acetaminophen (NORCO) 10-325 MG per tablet Take 1 tablet by mouth every 6 (six) hours as needed.    Historical Provider, MD  oxyCODONE-acetaminophen (PERCOCET/ROXICET) 5-325 MG per tablet Take 1 tablet by mouth every 6 (six) hours as needed for pain.    Historical Provider, MD  predniSONE (DELTASONE) 10 MG tablet 5,4,3,2,1 - take with food Patient not taking: Reported on 12/22/2014 11/29/14   Ivery Quale, PA-C  topiramate (TOPAMAX) 100 MG tablet Take 100 mg by mouth daily.     Historical Provider, MD     BP 126/88 mmHg  Pulse 69  Temp(Src) 97.7 F (36.5 C) (Oral)  SpO2 97% Physical Exam  Constitutional: He is oriented to person, place, and time. He appears well-developed and well-nourished. No distress.  HENT:  Head: Normocephalic and atraumatic.  Right Ear: External ear normal.  Left Ear: External ear normal.  Nose: Nose normal.  Mouth/Throat: Uvula is midline, oropharynx is clear and moist and mucous membranes are normal.  Eyes: Conjunctivae, EOM and lids are normal. Pupils are equal, round, and reactive to light. Right eye exhibits no discharge. Left eye exhibits no discharge. No scleral icterus.  Neck: Normal range of motion. Neck supple.  Cardiovascular: Normal rate, regular rhythm, normal heart sounds, intact distal pulses and normal pulses.   Pulmonary/Chest: Effort normal and breath sounds normal. No respiratory distress. He has no wheezes. He has no rales.  Abdominal: Soft. Normal appearance and bowel sounds are normal. He exhibits no distension and no mass. There is no tenderness. There is no rigidity, no rebound and no guarding.  Genitourinary: Prostate normal, testes normal and penis normal. Rectal exam shows external hemorrhoid. Rectal exam shows no fissure, no mass, no  tenderness and anal tone normal. Guaiac negative stool. Right testis shows no tenderness. No penile tenderness.  Non thrombosed external hemorrhoid.   Musculoskeletal: Normal range of motion. He exhibits no edema or tenderness.  Neurological: He is alert and oriented to person, place, and time. He has normal strength. No sensory deficit.  Skin: Skin is warm, dry and intact. No rash noted. He is not diaphoretic. No erythema. No pallor.  Psychiatric: He has a normal mood and affect. His speech is normal and behavior is normal. Judgment and thought content normal.  Nursing note and vitals reviewed.   ED Course  Procedures (including critical care time)  Labs Review Labs Reviewed  CBC - Abnormal; Notable for the following:    MCV 100.4 (*)    MCH 34.3 (*)    RDW 15.8 (*)    All other components within normal limits  BASIC METABOLIC PANEL - Abnormal; Notable for the following:    Glucose, Bld 110 (*)  All other components within normal limits  URINALYSIS, ROUTINE W REFLEX MICROSCOPIC (NOT AT [facility]) - Abnormal; Notable for the following:    Hgb urine dipstick LARGE (*)    All other components within normal limits  URINE MICROSCOPIC-ADD ON  OCCULT BLOOD X 1 CARD TO LAB, STOOL  POC OCCULT BLOOD, ED    Imaging Review No results found.   I have personally reviewed and evaluated these images and lab results as part of my medical decision-making.   EKG Interpretation None      MDM   Final diagnoses:  Hematuria    59 year old male presents with hematuria s/p falling on his buttocks on Tuesday. He states his urine has been pink. He denies fever, chills, abdominal pain, N/V/D/C, dysuria, urgency, frequency, urinary retention, penile pain/swelling/discharge, testicular pain/swelling.   Patient is afebrile. Vital signs stable. Heart RRR. Lungs clear to auscultation bilaterally. Abdomen soft, non-tender, non-distended. No lower extremity edema. No signs of trauma on GU exam.  CBC,  BMP unremarkable. Occult blood negative. UA with large hemoglobin, 11-20 RBC on microscopic.  Patient to follow-up with urology for further evaluation and management of symptoms. Feel patient is stable for discharge at this time. Return precautions discussed at length.  BP 130/92 mmHg  Pulse 82  Temp(Src) 97.7 F (36.5 C) (Oral)  SpO2 94%      Mady Gemma, PA-C 01/05/15 1556  Jerelyn Scott, MD 01/05/15 1558

## 2015-01-05 NOTE — ED Notes (Signed)
NAD at this time. Pt is stable and going home.  

## 2015-01-05 NOTE — ED Notes (Addendum)
Pt states his leg gave out and he fell onto a coffee on his buttock on Tuesday, immediately after he felt the urge to have a bowel movement and noticed blood in it and then began to have urinary hesitancy with hematuria that has persisted since. He went to [facility] today for f/u of knee pain and they sent him to er for further eval of urinary and gastroinestinal bleeding. C/o groin pain since the fall.

## 2015-01-05 NOTE — Discharge Instructions (Signed)
1. Medications: usual home medications 2. Treatment: rest, drink plenty of fluids 3. Follow Up: please followup with your primary doctor within the next week for discussion of your diagnoses and further evaluation after today's visit; if you do not have a primary care doctor use the resource guide provided to find one; please return to the ER for new or worsening symptoms, urinary retention, high fever    Hematuria, Adult Hematuria is blood in your urine. It can be caused by a bladder infection, kidney infection, prostate infection, kidney stone, or cancer of your urinary tract. Infections can usually be treated with medicine, and a kidney stone usually will pass through your urine. If neither of these is the cause of your hematuria, further workup to find out the reason may be needed. It is very important that you tell your health care provider about any blood you see in your urine, even if the blood stops without treatment or happens without causing pain. Blood in your urine that happens and then stops and then happens again can be a symptom of a very serious condition. Also, pain is not a symptom in the initial stages of many urinary cancers. HOME CARE INSTRUCTIONS   Drink lots of fluid, 3-4 quarts a day. If you have been diagnosed with an infection, cranberry juice is especially recommended, in addition to large amounts of water.  Avoid caffeine, tea, and carbonated beverages because they tend to irritate the bladder.  Avoid alcohol because it may irritate the prostate.  Take all medicines as directed by your health care provider.  If you were prescribed an antibiotic medicine, finish it all even if you start to feel better.  If you have been diagnosed with a kidney stone, follow your health care provider's instructions regarding straining your urine to catch the stone.  Empty your bladder often. Avoid holding urine for long periods of time.  After a bowel movement, women should cleanse  front to back. Use each tissue only once.  Empty your bladder before and after sexual intercourse if you are a male. SEEK MEDICAL CARE IF:  You develop back pain.  You have a fever.  You have a feeling of sickness in your stomach (nausea) or vomiting.  Your symptoms are not better in 3 days. Return sooner if you are getting worse. SEEK IMMEDIATE MEDICAL CARE IF:   You develop severe vomiting and are unable to keep the medicine down.  You develop severe back or abdominal pain despite taking your medicines.  You begin passing a large amount of blood or clots in your urine.  You feel extremely weak or faint, or you pass out. MAKE SURE YOU:   Understand these instructions.  Will watch your condition.  Will get help right away if you are not doing well or get worse.   This information is not intended to replace advice given to you by your health care provider. Make sure you discuss any questions you have with your health care provider.   Document Released: 03/17/2005 Document Revised: 04/07/2014 Document Reviewed: 11/15/2012 Elsevier Interactive Patient Education [employer]  Ureteroscopy Ureteroscopy is a surgical procedure to check for and treat a problem inside part of your urinary tract. You may need this procedure if you have frequent urinary tract infections, blood in your urine, or a stone in one of the tubes that carries urine from your kidneys to your bladder (ureter). You may also have this procedure if your health care provider wants to check for  an abnormal growth in your ureter. To do this, your surgeon passes a thin, flexible telescope (ureteroscope) into one or both ureters. LET [facility] CARE PROVIDER KNOW ABOUT:   Any allergies you have.  All medicines you are taking, including vitamins, herbs, eye drops, creams, and over-the-counter medicines.  Previous problems you or members of your family have had with the use of anesthetics.  Any blood  disorders you have.  Previous surgeries you have had.  Medical conditions you have. RISKS AND COMPLICATIONS  Generally, this is a safe procedure. However, as with any procedure, problems can occur. Possible problems include:   Bleeding.  Pain.  Infection.  Scarring that narrows the ureter (stricture).  Creating a hole in the ureter (perforation). BEFORE THE PROCEDURE   You may be given a medicine to make you sleep during ureteroscopy (general anesthetic).  Do not eat or drink anything for 6-8 hours before the procedure.  You may also need to have a urine test before the procedure to check for any sign of infection.  You may be given an antibiotic medicine by injection or through an IV tube before the procedure to prevent infection.  Arrange for someone to take you home after the procedure. PROCEDURE  You may be given one of the following medicines for this procedure:  A medicine that makes you go to sleep (general anesthetic).  A medicine injected into your spine that numbs your body below the waist (spinal anesthetic). This procedure is usually done as outpatient surgery and usually takes about 30 minutes. During the procedure:  The opening from which you urinate (urethra) will be cleaned with a germ-killing solution.  The ureteroscope will be passed through your urethra into your bladder.  A saltwater solution will flow through the ureteroscope to fill your bladder. This will help the surgeon see the openings of your ureters clearly.  The ureteroscope will then be passed into your ureter.  If a growth is found, your surgeon may take a piece of the growth (biopsy) to examine under a microscope.  If a stone is found, your surgeon may remove the stone through the ureteroscope or break up the stone using a laser, shock waves, or electrical energy.  In some cases, the surgeon may put in a tube that keeps your ureter open (ureteral stent).  When the procedure is over,  the surgeon will remove the scope. Then your bladder will be emptied and you will go to the recovery area. AFTER THE PROCEDURE  After ureteroscopy, you will remain in the recovery area until the anesthesia wears off and your surgeon says you can go home.    This information is not intended to replace advice given to you by your health care provider. Make sure you discuss any questions you have with your health care provider.   Document Released: 03/22/2013 Document Reviewed: 03/22/2013 Elsevier Interactive Patient Education [employer]   Emergency Department Resource Guide 1) Find a Doctor and Pay Out of Pocket Although you won't have to find out who is covered by your insurance plan, it is a good idea to ask around and get recommendations. You will then need to call the office and see if the doctor you have chosen will accept you as a new patient and what types of options they offer for patients who are self-pay. Some doctors offer discounts or will set up payment plans for their patients who do not have insurance, but you will need to ask so you aren't  surprised when you get to your appointment.  2) Contact Your Local Health Department Not all health departments have doctors that can see patients for sick visits, but many do, so it is worth a call to see if yours does. If you don't know where your local health department is, you can check in your phone book. The CDC also has a tool to help you locate your state's health department, and many state websites also have listings of all of their local health departments.  3) Find a Walk-in Clinic If your illness is not likely to be very severe or complicated, you may want to try a walk in clinic. These are popping up all over the country in pharmacies, drugstores, and shopping centers. They're usually staffed by nurse practitioners or physician assistants that have been trained to treat common illnesses and complaints. They're usually fairly  quick and inexpensive. However, if you have serious medical issues or chronic medical problems, these are probably not your best option.  No Primary Care Doctor: - Call Health Connect at  [phone] - they can help you locate a primary care doctor that  accepts your insurance, provides certain services, etc. - Physician Referral Service- [phone]  Chronic Pain Problems: Organization         Address  Phone   Notes  [facility]  [phone] Patients need to be referred by their primary care doctor.   Medication Assistance: Organization         Address  Phone   Notes  [facility] Medication [facility] [address] [place] [place], [place] [address] [phone] --Must be a resident of [facility] -- Must have NO insurance coverage whatsoever (no Medicaid/ Medicare, etc.) -- The pt. MUST have a primary care doctor that directs their care regularly and follows them in the community   [facility]ist  [phone]   [employer]  [phone]    Agencies that provide inexpensive medical care: Organization         Address  Phone   Notes  [facility]  [phone]   [facility]    [phone]   [facility] [address] [place], [place] [address] [phone]   [facility] [address] [place] [place] [phone]   Planned Parenthood    [phone]   [facility]    [phone]   [facility]  [address], [place] Phone:  [phone], Fax:  [phone] Hours of Operation:  9 am - 6 pm, M-F.  Also accepts Medicaid/Medicare and self-pay.  [facility]  [address], [place] [employer] Phone: [phone], Fax: [phone]. Hours of Operation:  8:30 am - 5:30 pm, M-F.  Also accepts Medicaid and self-pay.  [facility] [place] [address] [place] Phone: [phone]    [facility] [address] Natasha Bence [place], [place] [phone], Ext. [phone] Mondays & Thursdays: 7-9 AM.  First 15 patients are seen on a first come, first serve basis.    Medicaid-accepting [name] Providers:  Organization         Address  Phone   Notes  [facility] [address],  [phone] Also accepts self-pay patients.  [facility] [address] Laurell Josephs [place], [place]  [phone]   [facility]  [facility] [address], [place] [phone]   [facility] [address] [place] [phone]   Renaye Rakers [address], [place]   [phone] Only accepts [place] Access [place] patients after they have their name applied to their card.   Self-Pay (no insurance) in [facility]:  Organization         Address  Phone   Notes  Sickle Cell Patients, [facility] Internal Medicine [address] [place], [place] [phone]   [facility] [address] [place], [place] [phone]   [facility] [place]  [address], [place] [phone]   [facility]/Dr. Osei-Bonsu  [address], [place] or [address], [place] [phone] Phone number for both [place] and [place] locations is the same.  Urgent Medical and [facility] [address] [place] [phone]   [facility] [address], [place] or [address] [phone]   [facility] [address], [place] [phone], phone; [phone], fax Sees patients 1st and 3rd Saturday of every month.  Must not qualify for public or private insurance (i.e. Medicaid, Medicare, [place] Health Choice, Veterans' Benefits)  Household income should be no more than 200% of the poverty level The clinic cannot treat you if you are  pregnant or think you are pregnant  Sexually transmitted diseases are not treated at the clinic.    Dental Care: Organization         Address  Phone  Notes  [facility] Department of [facility] [address] [place], [place] [phone] Accepts children up to age 99 who are enrolled in [place] or [place] Health Choice; pregnant women with a Medicaid card; and children who have applied for Medicaid or [place] Health Choice, but were declined, whose parents can pay a reduced fee at time of service.  [facility]  [address], [place] [phone] Accepts children up to age 3 who are enrolled in [place] or [place] Health Choice; pregnant women with a Medicaid card; and children who have applied for Medicaid or [employer] Health Choice, but were declined, whose parents can pay a reduced fee at time of service.  [facility]  [address] [place], [place] [phone] Patients are seen by appointment only. Walk-ins are not accepted. [facility] will see patients 72 years of age and older. Monday - Tuesday (8am-5pm) Most Wednesdays (8:30-5pm) $30 per visit, cash only  [facility] Adult Dental Access PROGRAM  [address], [facility] [phone] Patients are seen by appointment only. Walk-ins are not accepted. [facility] will see patients 57 years of age and older. One Wednesday Evening (Monthly: Volunteer Based).  $30 per visit, cash only  [employer]  [phone] for adults; Children under age 21, call Graduate Pediatric Dentistry at [phone]. Children aged 15-14, please call [phone] to request a pediatric application.  Dental services are provided in all areas of dental care including fillings, crowns and bridges, complete and partial dentures, implants, gum treatment, root canals, and extractions. Preventive care is also provided. Treatment is provided to  both adults and children. Patients are selected via a lottery and there is often a waiting list.  [facility] [address] [place]  [phone] [email or web address]   [facility] [address] [place], [place] [phone], Ext. [phone] Second and Fourth Thursday of each month, opens at 6:30 AM; Clinic ends at 9 AM.  Patients are seen on a first-come first-served basis, and a limited number are seen during each clinic.   [facility]  [address] Ether Griffins [place], [place] [phone]   Eligibility Requirements You must have lived in [place], [place], or [place] counties for at least the last three months.   You cannot be eligible for state or federal sponsored [place], including [employer], [place], or [employer].   You generally cannot be eligible for healthcare insurance through your employer.    How to apply: Eligibility screenings are held every Tuesday and Wednesday afternoon from 1:00 pm until 4:00 pm. You do not need an appointment for the interview!  [facility] [address], [place], [place] [phone]   [facility] Health Department  [phone]   [facility] Health Department  [phone]   [facility] Health Department  [phone]    Behavioral Health Resources in the Community: Intensive Outpatient Programs Organization         Address  Phone  Notes  [facility] [address], [place], [place] [phone]   [facility] Outpatient [address], [place], [place] [phone]   [facility] [address] [place], [place]  [phone]   [facility] Mental Health [address]  [place], [place] [phone] or [phone]   Substance Abuse Resources Organization         Address  Phone  Notes  [facility] Drug Services  [phone]   [facility]  [phone]   [facility] [place]   [phone]   Floydene Flock  [phone]   Residential & Outpatient Substance Abuse Program  [phone]   Psychological Services Organization         Address  Phone  Notes  [facility] Health  [phone]   [facility]  [phone]   [facility] Mental Health [address], [place] [phone] or [phone]    Mobile Crisis Teams Organization         Address  Phone  Notes  [facility], Mobile Crisis Care Unit  [phone]   [facility]  [address] [place], [place] [phone]   Doristine Locks [address] [place] [phone]    Self-Help/Support Groups Organization         Address  Phone             Notes  [facility] [place] - variety of support groups  [phone]- [phone] Call for more information  Narcotics Anonymous (NA), [facility] [address], [employer] [place]  2 meetings at this location   Statistician         Address  Phone  Notes  [facility] [address],    [facility] [place]  [phone]   [facility]  [address] [place] [address], [place], [place] [phone]   [facility] [address] [place], [place] [phone] Admissions: 8am-3pm M-F  [facility] [address],    [place], [place] [phone]   [facility] [address] [place], [place], [place] [phone]   [facility] [address],  [place], [place] [phone]   [facility] - Intensive Outpatient [address] Laurell Josephs [address], [place], [place] [phone]   [facility] ([facility]iction [facility] [address] [place]  [place], [place] [phone] or [phone]   [facility] ([facility]) [address], [place], [place] [phone] Accepts Medicaid  [facility] [address]  [place] [phone] Substance Abuse/Addiction Treatment   [facility] Organization         Address  Phone  Notes  [facility]  [phone]   Angie Fava, PhD [address] Ervin Knack [place], [place]   [phone] or [phone]   [facility] Behavioral   [address] [place], [place] [phone]   [facility] [address] [place], [place] [phone] Insurance/Medicaid/sponsorship through [facility] [address]                                    [place], [place] [phone] Therapy/tele-psych/case  [facility] [address], [place] [phone]    Dr. Lolly Mustache  [phone]   [facility] [place]  [employer] [facility] Dept. 1) [address] [place] 2) [address], [place] 3)  [address], [place] [phone]  [phone]   [facility] Child Abuse Hotline [phone] or [phone] (After Hours)

## 2015-03-02 ENCOUNTER — Encounter: Payer: Self-pay | Admitting: Family

## 2015-03-09 ENCOUNTER — Other Ambulatory Visit (HOSPITAL_COMMUNITY): Payer: Medicaid Other

## 2015-03-09 ENCOUNTER — Ambulatory Visit (INDEPENDENT_AMBULATORY_CARE_PROVIDER_SITE_OTHER): Payer: Medicaid Other | Admitting: Family

## 2015-03-09 ENCOUNTER — Ambulatory Visit (HOSPITAL_COMMUNITY)
Admission: RE | Admit: 2015-03-09 | Discharge: 2015-03-09 | Disposition: A | Discharge status: Home / Self Care | Payer: Medicaid Other | Source: Ambulatory Visit | Attending: Family | Admitting: Family

## 2015-03-09 ENCOUNTER — Encounter: Payer: Self-pay | Admitting: Family

## 2015-03-09 VITALS — BP 142/98 | HR 78 | Ht 70.0 in | Wt 129.0 lb

## 2015-03-09 DIAGNOSIS — I779 Disorder of arteries and arterioles, unspecified: Secondary | ICD-10-CM

## 2015-03-09 DIAGNOSIS — Z4889 Encounter for other specified surgical aftercare: Secondary | ICD-10-CM | POA: Diagnosis not present

## 2015-03-09 DIAGNOSIS — Z720 Tobacco use: Secondary | ICD-10-CM | POA: Insufficient documentation

## 2015-03-09 DIAGNOSIS — Z48812 Encounter for surgical aftercare following surgery on the circulatory system: Secondary | ICD-10-CM

## 2015-03-09 DIAGNOSIS — F17200 Nicotine dependence, unspecified, uncomplicated: Secondary | ICD-10-CM

## 2015-03-09 DIAGNOSIS — Z95828 Presence of other vascular implants and grafts: Secondary | ICD-10-CM

## 2015-03-09 NOTE — Patient Instructions (Signed)
Peripheral Vascular Disease Peripheral vascular disease (PVD) is a disease of the blood vessels that are not part of your heart and brain. A simple term for PVD is poor circulation. In most cases, PVD narrows the blood vessels that carry blood from your heart to the rest of your body. This can result in a decreased supply of blood to your arms, legs, and internal organs, like your stomach or kidneys. However, it most often affects a person's lower legs and feet. There are two types of PVD.  Organic PVD. This is the more common type. It is caused by damage to the structure of blood vessels.  Functional PVD. This is caused by conditions that make blood vessels contract and tighten (spasm). Without treatment, PVD tends to get worse over time. PVD can also lead to acute ischemic limb. This is when an arm or limb suddenly has trouble getting enough blood. This is a medical emergency. CAUSES Each type of PVD has many different causes. The most common cause of PVD is buildup of a fatty material (plaque) inside of your arteries (atherosclerosis). Small amounts of plaque can break off from the walls of the blood vessels and become lodged in a smaller artery. This blocks blood flow and can cause acute ischemic limb. Other common causes of PVD include:  Blood clots that form inside of blood vessels.  Injuries to blood vessels.  Diseases that cause inflammation of blood vessels or cause blood vessel spasms.  Health behaviors and health history that increase your risk of developing PVD. RISK FACTORS  You may have a greater risk of PVD if you:  Have a family history of PVD.  Have certain medical conditions, including:  High cholesterol.  Diabetes.  High blood pressure (hypertension).  Coronary heart disease.  Past problems with blood clots.  Past injury, such as burns or a broken bone. These may have damaged blood vessels in your limbs.  Buerger disease. This is caused by inflamed blood  vessels in your hands and feet.  Some forms of arthritis.  Rare birth defects that affect the arteries in your legs.  Use tobacco.  Do not get enough exercise.  Are obese.  Are age 50 or older. SIGNS AND SYMPTOMS  PVD may cause many different symptoms. Your symptoms depend on what part of your body is not getting enough blood. Some common signs and symptoms include:  Cramps in your lower legs. This may be a symptom of poor leg circulation (claudication).  Pain and weakness in your legs while you are physically active that goes away when you rest (intermittent claudication).  Leg pain when at rest.  Leg numbness, tingling, or weakness.  Coldness in a leg or foot, especially when compared with the other leg.  Skin or hair changes. These can include:  Hair loss.  Shiny skin.  Pale or bluish skin.  Thick toenails.  Inability to get or maintain an erection (erectile dysfunction). People with PVD are more prone to developing ulcers and sores on their toes, feet, or legs. These may take longer than normal to heal. DIAGNOSIS Your health care provider may diagnose PVD from your signs and symptoms. The health care provider will also do a physical exam. You may have tests to find out what is causing your PVD and determine its severity. Tests may include:  Blood pressure recordings from your arms and legs and measurements of the strength of your pulses (pulse volume recordings).  Imaging studies using sound waves to take pictures of   the blood flow through your blood vessels (Doppler ultrasound).  Injecting a dye into your blood vessels before having imaging studies using:  X-rays (angiogram or arteriogram).  Computer-generated X-rays (CT angiogram).  A powerful electromagnetic field and a computer (magnetic resonance angiogram or MRA). TREATMENT Treatment for PVD depends on the cause of your condition and the severity of your symptoms. It also depends on your age. Underlying  causes need to be treated and controlled. These include long-lasting (chronic) conditions, such as diabetes, high cholesterol, and high blood pressure. You may need to first try making lifestyle changes and taking medicines. Surgery may be needed if these do not work. Lifestyle changes may include:  Quitting smoking.  Exercising regularly.  Following a low-fat, low-cholesterol diet. Medicines may include:  Blood thinners to prevent blood clots.  Medicines to improve blood flow.  Medicines to improve your blood cholesterol levels. Surgical procedures may include:  A procedure that uses an inflated balloon to open a blocked artery and improve blood flow (angioplasty).  A procedure to put in a tube (stent) to keep a blocked artery open (stent implant).  Surgery to reroute blood flow around a blocked artery (peripheral bypass surgery).  Surgery to remove dead tissue from an infected wound on the affected limb.  Amputation. This is surgical removal of the affected limb. This may be necessary in cases of acute ischemic limb that are not improved through medical or surgical treatments. HOME CARE INSTRUCTIONS  Take medicines only as directed by your health care provider.  Do not use any tobacco products, including cigarettes, chewing tobacco, or electronic cigarettes. If you need help quitting, ask your health care provider.  Lose weight if you are overweight, and maintain a healthy weight as directed by your health care provider.  Eat a diet that is low in fat and cholesterol. If you need help, ask your health care provider.  Exercise regularly. Ask your health care provider to suggest some good activities for you.  Use compression stockings or other mechanical devices as directed by your health care provider.  Take good care of your feet.  Wear comfortable shoes that fit well.  Check your feet often for any cuts or sores. SEEK MEDICAL CARE IF:  You have cramps in your legs  while walking.  You have leg pain when you are at rest.  You have coldness in a leg or foot.  Your skin changes.  You have erectile dysfunction.  You have cuts or sores on your feet that are not healing. SEEK IMMEDIATE MEDICAL CARE IF:  Your arm or leg turns cold and blue.  Your arms or legs become red, warm, swollen, painful, or numb.  You have chest pain or trouble breathing.  You suddenly have weakness in your face, arm, or leg.  You become very confused or lose the ability to speak.  You suddenly have a very bad headache or lose your vision.   This information is not intended to replace advice given to you by your health care provider. Make sure you discuss any questions you have with your health care provider.   Document Released: 04/24/2004 Document Revised: 04/07/2014 Document Reviewed: 08/25/2013 Elsevier Interactive Patient Education 2016 Elsevier Inc[employer]     Steps to Quit Smoking  Smoking tobacco can be harmful to your health and can affect almost every organ in your body. Smoking puts you, and those around you, at risk for developing many serious chronic diseases. Quitting smoking is difficult, but it is one of   the best things that you can do for your health. It is never too late to quit. WHAT ARE THE BENEFITS OF QUITTING SMOKING? When you quit smoking, you lower your risk of developing serious diseases and conditions, such as:  Lung cancer or lung disease, such as COPD.  Heart disease.  Stroke.  Heart attack.  Infertility.  Osteoporosis and bone fractures. Additionally, symptoms such as coughing, wheezing, and shortness of breath may get better when you quit. You may also find that you get sick less often because your body is stronger at fighting off colds and infections. If you are pregnant, quitting smoking can help to reduce your chances of having a baby of low birth weight. HOW DO I GET READY TO QUIT? When you decide to quit smoking, create a plan to  make sure that you are successful. Before you quit:  Pick a date to quit. Set a date within the next two weeks to give you time to prepare.  Write down the reasons why you are quitting. Keep this list in places where you will see it often, such as on your bathroom mirror or in your car or wallet.  Identify the people, places, things, and activities that make you want to smoke (triggers) and avoid them. Make sure to take these actions:  Throw away all cigarettes at home, at work, and in your car.  Throw away smoking accessories, such as ashtrays and lighters.  Clean your car and make sure to empty the ashtray.  Clean your home, including curtains and carpets.  Tell your family, friends, and coworkers that you are quitting. Support from your loved ones can make quitting easier.  Talk with your health care provider about your options for quitting smoking.  Find out what treatment options are covered by your health insurance. WHAT STRATEGIES CAN I USE TO QUIT SMOKING?  Talk with your healthcare provider about different strategies to quit smoking. Some strategies include:  Quitting smoking altogether instead of gradually lessening how much you smoke over a period of time. Research shows that quitting "cold turkey" is more successful than gradually quitting.  Attending in-person counseling to help you build problem-solving skills. You are more likely to have success in quitting if you attend several counseling sessions. Even short sessions of 10 minutes can be effective.  Finding resources and support systems that can help you to quit smoking and remain smoke-free after you quit. These resources are most helpful when you use them often. They can include:  Online chats with a counselor.  Telephone quitlines.  Printed self-help materials.  Support groups or group counseling.  Text messaging programs.  Mobile phone applications.  Taking medicines to help you quit smoking. (If you are  pregnant or breastfeeding, talk with your health care provider first.) Some medicines contain nicotine and some do not. Both types of medicines help with cravings, but the medicines that include nicotine help to relieve withdrawal symptoms. Your health care provider may recommend:  Nicotine patches, gum, or lozenges.  Nicotine inhalers or sprays.  Non-nicotine medicine that is taken by mouth. Talk with your health care provider about combining strategies, such as taking medicines while you are also receiving in-person counseling. Using these two strategies together makes you more likely to succeed in quitting than if you used either strategy on its own. If you are pregnant or breastfeeding, talk with your health care provider about finding counseling or other support strategies to quit smoking. Do not take medicine to help you   quit smoking unless told to do so by your health care provider. WHAT THINGS CAN I DO TO MAKE IT EASIER TO QUIT? Quitting smoking might feel overwhelming at first, but there is a lot that you can do to make it easier. Take these important actions:  Reach out to your family and friends and ask that they support and encourage you during this time. Call telephone quitlines, reach out to support groups, or work with a counselor for support.  Ask people who smoke to avoid smoking around you.  Avoid places that trigger you to smoke, such as bars, parties, or smoke-break areas at work.  Spend time around people who do not smoke.  Lessen stress in your life, because stress can be a smoking trigger for some people. To lessen stress, try:  Exercising regularly.  Deep-breathing exercises.  Yoga.  Meditating.  Performing a body scan. This involves closing your eyes, scanning your body from head to toe, and noticing which parts of your body are particularly tense. Purposefully relax the muscles in those areas.  Download or purchase mobile phone or tablet apps (applications)  that can help you stick to your quit plan by providing reminders, tips, and encouragement. There are many free apps, such as QuitGuide from the CDC (Centers for Disease Control and Prevention). You can find other support for quitting smoking (smoking cessation) through smokefree.gov and other websites. HOW WILL I FEEL WHEN I QUIT SMOKING? Within the first 24 hours of quitting smoking, you may start to feel some withdrawal symptoms. These symptoms are usually most noticeable 2-3 days after quitting, but they usually do not last beyond 2-3 weeks. Changes or symptoms that you might experience include:  Mood swings.  Restlessness, anxiety, or irritation.  Difficulty concentrating.  Dizziness.  Strong cravings for sugary foods in addition to nicotine.  Mild weight gain.  Constipation.  Nausea.  Coughing or a sore throat.  Changes in how your medicines work in your body.  A depressed mood.  Difficulty sleeping (insomnia). After the first 2-3 weeks of quitting, you may start to notice more positive results, such as:  Improved sense of smell and taste.  Decreased coughing and sore throat.  Slower heart rate.  Lower blood pressure.  Clearer skin.  The ability to breathe more easily.  Fewer sick days. Quitting smoking is very challenging for most people. Do not get discouraged if you are not successful the first time. Some people need to make many attempts to quit before they achieve long-term success. Do your best to stick to your quit plan, and talk with your health care provider if you have any questions or concerns.   This information is not intended to replace advice given to you by your health care provider. Make sure you discuss any questions you have with your health care provider.   Document Released: 03/11/2001 Document Revised: 08/01/2014 Document Reviewed: 08/01/2014 Elsevier Interactive Patient Education 2016 Elsevier Inc.  

## 2015-03-09 NOTE — Progress Notes (Signed)
[facility] [place] HISTORY AND PHYSICAL -PAD  History of Present Illness Brady Bell is a 59 y.o. male patient of Dr. Imogene Burnhen who is s/p left common femoral artery to anterior tibial artery bypass with non-reversed ipslateral greater saphenous vein (Date: 09/06/11).  He returns today for routine follow up.   He had been homeless for about 2 years, but is now living in an apt.   He now has disability status.  He denies claudication symptoms, denies non healing wounds, his left knee has a medial meniscus tear and he needs surgical repair, he wears a brace on his knee.  He had a left knee arthroscopic procedure in February 2016, states subsequently he needs another left knee arthroscopic procedure, Dr. Renaye Rakersim Murphy. Pt reports considerable discomfort in his left knee, wears a  brace.  He denies history of stroke or TIA.  Pt Diabetic: No  Pt smoker: smoker (1/2 ppd x since age 59 with several periods of abstinence)   Pt meds include:  Statin :No, states his cholesterol is good  ASA: Yes  Other anticoagulants/antiplatelets: no    Past Medical History  Diagnosis Date  . Migraines   . Hypoglycemia   . Seizures (HCC)   . Chronic knee pain   . Chronic ankle pain   . Peripheral vascular disease [facility](HCC)     Social History Social History  Substance Use Topics  . Smoking status: Light Tobacco Smoker -- 0.50 packs/day for 30 years    Types: Cigarettes  . Smokeless tobacco: Never Used     Comment: pt states that he is trying his best to quit  . Alcohol Use: 0.0 oz/week    0 Standard drinks or equivalent per week     Comment: 3 or 4 times a week    Family History Family History  Problem Relation Age of Onset  . Diabetes    . Alcohol abuse Father     Past Surgical History  Procedure Laterality Date  . Femoral-tibial bypass graft  09/06/2011    Procedure: BYPASS GRAFT FEMORAL-TIBIAL ARTERY;  Surgeon: Fransisco HertzBrian L Chen, MD;  [facility];  Service:  Vascular;  Laterality: Left;  . Fasciotomy  09/06/2011    Procedure: FASCIOTOMY;  Surgeon: Fransisco HertzBrian L Chen, MD;  [facility];  Service: Vascular;  Laterality: Left;  . Pr vein bypass graft,aorto-fem-pop  09/06/11    Left   . Tooth extraction  June-July-Aug. 2015    Several   . Lower extremity angiogram N/A 09/05/2011    Procedure: LOWER EXTREMITY ANGIOGRAM;  Surgeon: Sherren Kernsharles E Fields, MD;  [facility];  Service: Cardiovascular;  Laterality: N/A;  . Lower extremity angiogram Left 04/15/2012    Procedure: LOWER EXTREMITY ANGIOGRAM;  Surgeon: Fransisco HertzBrian L Chen, MD;  [facility];  Service: Cardiovascular;  Laterality: Left;    No Known Allergies  Current Outpatient Prescriptions  Medication Sig Dispense Refill  . aspirin EC 81 MG tablet Take 81 mg by mouth daily.    Marland Kitchen. gabapentin (NEURONTIN) 300 MG capsule Take 300 mg by mouth 3 (three) times daily.    Marland Kitchen. topiramate (TOPAMAX) 100 MG tablet Take 100 mg by mouth daily.     . cyclobenzaprine (FLEXERIL) 10 MG tablet Take 1 tablet (10 mg total) by mouth 2 (two) times daily as needed for muscle spasms. (Patient not taking: Reported on 12/22/2014) 20 tablet 0  . diclofenac (VOLTAREN) 75 MG EC tablet Take 1 tablet (75 mg total) by mouth  2 (two) times daily. (Patient not taking: Reported on 12/22/2014) 14 tablet 0   No current facility-administered medications for this visit.    ROS: See HPI for pertinent positives and negatives.   Physical Examination  Filed Vitals:   03/09/15 1326 03/09/15 1328  BP: 152/98 142/98  Pulse: 77 78  Height:  (1.778 m)   Weight: 129 lb (58.514 kg)   SpO2: 98%    Body mass index is 18.51 kg/(m^2).  General: A&O x 3, WDWN. Thin male. Gait: antalgic, limping, uses a cane  Eyes: PERRLA.  Pulmonary: CTAB but somewhat limited air movement, without wheezes , rales or rhonchi.  Cardiac: regular rythm, no detected murmur.   Carotid Bruits  Left  Right    Negative  Negative     Aorta is not palpable.  Radial pulses: are 2+ palpable and =   VASCULAR EXAM:  Extremities without ischemic changes  without Gangrene; without open wounds. No peripheral edema.   LE Pulses  LEFT  RIGHT   FEMORAL  palpable  palpable   POPLITEAL  graft palpable laterally  not palpable   POSTERIOR TIBIAL  not palpable  3+palpable   DORSALIS PEDIS  ANTERIOR TIBIAL  2+palpable  Not palpable    Abdomen: soft, NT, no palpable masses.  Skin: no rashes, no ulcers.  Musculoskeletal: No muscle wasting or atrophy. No signs of ischemia in extremities. Brace on left knee.  Neurologic: A&O X 3; Appropriate Affect, MOTOR FUNCTION: moving all extremities equally, motor strength 5/5 throughout except 4/5 in left lower extremity. Speech is fluent/normal. CN 2-12 intact.                 Non-Invasive Vascular Imaging: DATE: 03/09/2015 ABI: RIGHT: 1.26 (1.26, 12/17/14), Waveforms: tri and monophasic, Toe pressure: 112;  LEFT: 1.13 (1.14), Waveforms: mono and triphasic, Toe pressure: 116   ASSESSMENT: [name] is a 59 y.o. male who is s/p left common femoral artery to anterior tibial artery bypass with non-reversed ipslateral greater saphenous vein (Date: 09/06/11).  He has no claudication sx's with walking, no signs of ischemia in his feet/legs.  ABI's and toe pressures remain in the normal range with tri and monophasic waveforms bilaterally. His walking is limited by left knee pain torn meniscus.  Fortunately he does not have DM, but unfortunately he continues to smoke.     PLAN:  Daily seated leg exercises as taught to pt by physical therapy.  Walking daily as tolerated by his left knee injury and per his orthopedic surgeon.  The patient was counseled re smoking cessation and given several free resources re smoking cessation.  Based on the patient's vascular studies and examination, pt will return to clinic in 1 year with  ABI's and left LE arterial duplex.   I discussed in depth with the patient the nature of atherosclerosis, and emphasized the importance of maximal medical management including strict control of blood pressure, blood glucose, and lipid levels, obtaining regular exercise, and cessation of smoking.  The patient is aware that without maximal medical management the underlying atherosclerotic disease process will progress, limiting the benefit of any interventions.  The patient was given information about PAD including signs, symptoms, treatment, what symptoms should prompt the patient to seek immediate medical care, and risk reduction measures to take.  Charisse March, RN, MSN, FNP-C [facility] [place] Phone: [phone]  Clinic MD: Imogene Burn  03/09/2015 1:32 PM

## 2016-03-05 ENCOUNTER — Encounter: Payer: Self-pay | Admitting: Family

## 2016-03-14 ENCOUNTER — Ambulatory Visit (INDEPENDENT_AMBULATORY_CARE_PROVIDER_SITE_OTHER): Payer: Medicaid Other | Admitting: Family

## 2016-03-14 ENCOUNTER — Encounter: Payer: Self-pay | Admitting: Family

## 2016-03-14 ENCOUNTER — Encounter (HOSPITAL_COMMUNITY): Payer: Medicaid Other

## 2016-03-14 ENCOUNTER — Other Ambulatory Visit (HOSPITAL_COMMUNITY): Payer: Medicaid Other

## 2016-03-14 VITALS — BP 140/85 | HR 79 | Temp 97.1°F | Resp 18 | Wt 136.0 lb

## 2016-03-14 DIAGNOSIS — I779 Disorder of arteries and arterioles, unspecified: Secondary | ICD-10-CM

## 2016-03-14 DIAGNOSIS — F17200 Nicotine dependence, unspecified, uncomplicated: Secondary | ICD-10-CM | POA: Diagnosis not present

## 2016-03-14 DIAGNOSIS — Z95828 Presence of other vascular implants and grafts: Secondary | ICD-10-CM

## 2016-03-14 NOTE — Patient Instructions (Signed)
Peripheral Vascular Disease Peripheral vascular disease (PVD) is a disease of the blood vessels that are not part of your heart and brain. A simple term for PVD is poor circulation. In most cases, PVD narrows the blood vessels that carry blood from your heart to the rest of your body. This can result in a decreased supply of blood to your arms, legs, and internal organs, like your stomach or kidneys. However, it most often affects a person's lower legs and feet. There are two types of PVD.  Organic PVD. This is the more common type. It is caused by damage to the structure of blood vessels.  Functional PVD. This is caused by conditions that make blood vessels contract and tighten (spasm). Without treatment, PVD tends to get worse over time. PVD can also lead to acute ischemic limb. This is when an arm or limb suddenly has trouble getting enough blood. This is a medical emergency. Follow these instructions at home:  Take medicines only as told by your doctor.  Do not use any tobacco products, including cigarettes, chewing tobacco, or electronic cigarettes. If you need help quitting, ask your doctor.  Lose weight if you are overweight, and maintain a healthy weight as told by your doctor.  Eat a diet that is low in fat and cholesterol. If you need help, ask your doctor.  Exercise regularly. Ask your doctor for some good activities for you.  Take good care of your feet.  Wear comfortable shoes that fit well.  Check your feet often for any cuts or sores. Contact a doctor if:  You have cramps in your legs while walking.  You have leg pain when you are at rest.  You have coldness in a leg or foot.  Your skin changes.  You are unable to get or have an erection (erectile dysfunction).  You have cuts or sores on your feet that are not healing. Get help right away if:  Your arm or leg turns cold and blue.  Your arms or legs become red, warm, swollen, painful, or numb.  You have  chest pain or trouble breathing.  You suddenly have weakness in your face, arm, or leg.  You become very confused or you cannot speak.  You suddenly have a very bad headache.  You suddenly cannot see. This information is not intended to replace advice given to you by your health care provider. Make sure you discuss any questions you have with your health care provider. Document Released: 06/11/2009 Document Revised: 08/23/2015 Document Reviewed: 08/25/2013 Elsevier Interactive Patient Education  2017 Elsevier Inc.      Steps to Quit Smoking Smoking tobacco can be bad for your health. It can also affect almost every organ in your body. Smoking puts you and people around you at risk for many serious long-lasting (chronic) diseases. Quitting smoking is hard, but it is one of the best things that you can do for your health. It is never too late to quit. What are the benefits of quitting smoking? When you quit smoking, you lower your risk for getting serious diseases and conditions. They can include:  Lung cancer or lung disease.  Heart disease.  Stroke.  Heart attack.  Not being able to have children (infertility).  Weak bones (osteoporosis) and broken bones (fractures). If you have coughing, wheezing, and shortness of breath, those symptoms may get better when you quit. You may also get sick less often. If you are pregnant, quitting smoking can help to lower your chances   of having a baby of low birth weight. What can I do to help me quit smoking? Talk with your doctor about what can help you quit smoking. Some things you can do (strategies) include:  Quitting smoking totally, instead of slowly cutting back how much you smoke over a period of time.  Going to in-person counseling. You are more likely to quit if you go to many counseling sessions.  Using resources and support systems, such as:  Online chats with a counselor.  Phone quitlines.  Printed self-help  materials.  Support groups or group counseling.  Text messaging programs.  Mobile phone apps or applications.  Taking medicines. Some of these medicines may have nicotine in them. If you are pregnant or breastfeeding, do not take any medicines to quit smoking unless your doctor says it is okay. Talk with your doctor about counseling or other things that can help you. Talk with your doctor about using more than one strategy at the same time, such as taking medicines while you are also going to in-person counseling. This can help make quitting easier. What things can I do to make it easier to quit? Quitting smoking might feel very hard at first, but there is a lot that you can do to make it easier. Take these steps:  Talk to your family and friends. Ask them to support and encourage you.  Call phone quitlines, reach out to support groups, or work with a counselor.  Ask people who smoke to not smoke around you.  Avoid places that make you want (trigger) to smoke, such as:  Bars.  Parties.  Smoke-break areas at work.  Spend time with people who do not smoke.  Lower the stress in your life. Stress can make you want to smoke. Try these things to help your stress:  Getting regular exercise.  Deep-breathing exercises.  Yoga.  Meditating.  Doing a body scan. To do this, close your eyes, focus on one area of your body at a time from head to toe, and notice which parts of your body are tense. Try to relax the muscles in those areas.  Download or buy apps on your mobile phone or tablet that can help you stick to your quit plan. There are many free apps, such as QuitGuide from the CDC (Centers for Disease Control and Prevention). You can find more support from smokefree.gov and other websites. This information is not intended to replace advice given to you by your health care provider. Make sure you discuss any questions you have with your health care provider. Document Released:  01/11/2009 Document Revised: 11/13/2015 Document Reviewed: 08/01/2014 Elsevier Interactive Patient Education  2017 Elsevier Inc.  

## 2016-03-14 NOTE — Progress Notes (Signed)
[facility] [place]   CC: Follow up peripheral artery occlusive disease  History of Present Illness Brady Bell is a 60 y.o. male patient of Dr. Imogene Burnhen who is s/p left common femoral artery to anterior tibial artery bypass with non-reversed ipslateral greater saphenous vein (Date: 09/06/11).  He returns today for routine follow up, states his insurance denied coverage of his non invasive vascular studies, states he is scheduled to return next week, hopefully his insurance will have approved coverage of the testing by then.   He had been homeless for about 2 years, but is now living in an apt.   He now has disability status.  He denies claudication symptoms, denies non healing wounds, his left knee has a medial meniscus tear and he needs surgical repair, he wears a brace on his knee.  He had a left knee arthroscopic procedure in February 2016, states subsequently he needs another left knee arthroscopic procedure, Dr. Renaye Rakersim Murphy. Pt reports considerable discomfort in his left knee, wears a  brace.  He denies history of stroke or TIA.  Pt Diabetic: No  Pt smoker: smoker (1/2 ppd x since age 60 with several periods of abstinence)   Pt meds include:  Statin :No, states his cholesterol is good  ASA: Yes  Other anticoagulants/antiplatelets: no   Past Medical History:  Diagnosis Date  . Chronic ankle pain   . Chronic knee pain   . Hypoglycemia   . Migraines   . Peripheral vascular disease (HCC)   . Seizures [facility](HCC)     Social History Social History  Substance Use Topics  . Smoking status: Light Tobacco Smoker    Packs/day: 0.50    Years: 30.00    Types: Cigarettes  . Smokeless tobacco: Never Used     Comment: pt states that he is trying his best to quit  . Alcohol use 0.0 oz/week     Comment: 3 or 4 times a week    Family History Family History  Problem Relation Age of Onset  . Diabetes    . Alcohol abuse Father     Past Surgical History:   Procedure Laterality Date  . FASCIOTOMY  09/06/2011   Procedure: FASCIOTOMY;  Surgeon: Fransisco HertzBrian L Chen, MD;  [facility];  Service: Vascular;  Laterality: Left;  . FEMORAL-TIBIAL BYPASS GRAFT  09/06/2011   Procedure: BYPASS GRAFT FEMORAL-TIBIAL ARTERY;  Surgeon: Fransisco HertzBrian L Chen, MD;  [facility];  Service: Vascular;  Laterality: Left;  . LOWER EXTREMITY ANGIOGRAM N/A 09/05/2011   Procedure: LOWER EXTREMITY ANGIOGRAM;  Surgeon: Sherren Kernsharles E Fields, MD;  [facility];  Service: Cardiovascular;  Laterality: N/A;  . LOWER EXTREMITY ANGIOGRAM Left 04/15/2012   Procedure: LOWER EXTREMITY ANGIOGRAM;  Surgeon: Fransisco HertzBrian L Chen, MD;  [facility];  Service: Cardiovascular;  Laterality: Left;  . PR VEIN BYPASS GRAFT,AORTO-FEM-POP  09/06/11   Left   . TOOTH EXTRACTION  June-July-Aug. 2015   Several     No Known Allergies  Current Outpatient Prescriptions  Medication Sig Dispense Refill  . aspirin EC 81 MG tablet Take 81 mg by mouth daily.    . cyclobenzaprine (FLEXERIL) 10 MG tablet Take 1 tablet (10 mg total) by mouth 2 (two) times daily as needed for muscle spasms. 20 tablet 0  . diclofenac (VOLTAREN) 75 MG EC tablet Take 1 tablet (75 mg total) by mouth 2 (two) times daily. 14 tablet 0  . gabapentin (NEURONTIN) 300 MG capsule Take 300 mg  by mouth 3 (three) times daily.    Marland Kitchen. topiramate (TOPAMAX) 100 MG tablet Take 100 mg by mouth daily.      No current facility-administered medications for this visit.     ROS: See HPI for pertinent positives and negatives.   Physical Examination  Vitals:   03/14/16 1418 03/14/16 1421 03/14/16 1517  BP: (!) 144/100 (!) 143/101 140/85  Pulse: 94 88 79  Resp: 18    Temp: 97.1 F (36.2 C)    SpO2: 100%    Weight: 136 lb (61.7 kg)     Body mass index is 19.51 kg/m.    General: A&O x 3, WDWN. Thin male. Gait: antalgic, limping, uses a cane  Eyes: PERRLA.  Pulmonary: CTAB but somewhat limited air movement, without wheezes , rales or rhonchi.   Cardiac: regular rythm, no detected murmur.   Carotid Bruits  Left  Right    Negative  Negative    Aorta is not palpable.  Radial pulses: are 2+ palpable and =   VASCULAR EXAM:  Extremities without ischemic changes  without Gangrene; without open wounds. No peripheral edema.   LE Pulses  LEFT  RIGHT   FEMORAL  palpable  palpable   POPLITEAL  graft palpable laterally  not palpable   POSTERIOR TIBIAL  not palpable  2+palpable   DORSALIS PEDIS  ANTERIOR TIBIAL  1+palpable  Not palpable    Abdomen: soft, NT, no palpable masses.  Skin: no rashes, no ulcers.  Musculoskeletal: No muscle wasting or atrophy. No signs of ischemia in extremities. Brace on left knee.  Neurologic: A&O X 3; Appropriate Affect, MOTOR FUNCTION: moving all extremities equally, motor strength 5/5 throughout except 4/5 in left lower extremity. Speech is fluent/normal. CN 2-12 intact      ASSESSMENT: Brady Bell is a 60 y.o. male who is s/p left common femoral artery to anterior tibial artery bypass with non-reversed ipslateral greater saphenous vein (Date: 09/06/11).  He has no claudication sx's with walking, no signs of ischemia in his feet/legs.  His walking is limited by left knee pain torn meniscus.  Fortunately he does not have DM, but unfortunately he continues to smoke.  On 03-09-15: ABI's and toe pressures remained in the normal range with tri and monophasic waveforms bilaterally.    PLAN:  Daily seated leg exercises as taught to pt by physical therapy.  Walking daily as tolerated by his left knee injury and per his orthopedic surgeon.   The patient was counseled re smoking cessation and given several free resources re smoking cessation.  Based on the patient's vascular studies and examination, pt will return to clinic next week as scheduled for left LE arterial duplex and ABI's.   I discussed in depth with the  patient the nature of atherosclerosis, and emphasized the importance of maximal medical management including strict control of blood pressure, blood glucose, and lipid levels, obtaining regular exercise, and cessation of smoking.  The patient is aware that without maximal medical management the underlying atherosclerotic disease process will progress, limiting the benefit of any interventions.  The patient was given information about PAD including signs, symptoms, treatment, what symptoms should prompt the patient to seek immediate medical care, and risk reduction measures to take.  Charisse MarchSuzanne Nickel, RN, MSN, FNP-C [facility] [place] Office Phone: [phone]  Clinic MD: Myra GianottiBrabham  03/14/16 3:26 PM

## 2016-03-21 ENCOUNTER — Ambulatory Visit (HOSPITAL_COMMUNITY)
Admission: RE | Admit: 2016-03-21 | Discharge: 2016-03-21 | Disposition: A | Discharge status: Home / Self Care | Payer: Medicaid Other | Source: Ambulatory Visit | Attending: Family | Admitting: Family

## 2016-03-21 ENCOUNTER — Encounter: Payer: Self-pay | Admitting: Family

## 2016-03-21 ENCOUNTER — Ambulatory Visit (INDEPENDENT_AMBULATORY_CARE_PROVIDER_SITE_OTHER)
Admission: RE | Admit: 2016-03-21 | Discharge: 2016-03-21 | Disposition: A | Discharge status: Home / Self Care | Payer: Medicaid Other | Source: Ambulatory Visit | Attending: Family | Admitting: Family

## 2016-03-21 DIAGNOSIS — Z95828 Presence of other vascular implants and grafts: Secondary | ICD-10-CM | POA: Insufficient documentation

## 2016-03-21 DIAGNOSIS — Z4889 Encounter for other specified surgical aftercare: Secondary | ICD-10-CM | POA: Insufficient documentation

## 2016-03-21 DIAGNOSIS — I779 Disorder of arteries and arterioles, unspecified: Secondary | ICD-10-CM | POA: Diagnosis not present

## 2016-03-21 DIAGNOSIS — Z48812 Encounter for surgical aftercare following surgery on the circulatory system: Secondary | ICD-10-CM

## 2016-03-21 DIAGNOSIS — M25561 Pain in right knee: Secondary | ICD-10-CM | POA: Diagnosis not present

## 2016-03-21 DIAGNOSIS — R0989 Other specified symptoms and signs involving the circulatory and respiratory systems: Secondary | ICD-10-CM | POA: Diagnosis present

## 2016-04-04 ENCOUNTER — Encounter: Payer: Self-pay | Admitting: Family

## 2016-04-04 ENCOUNTER — Ambulatory Visit (INDEPENDENT_AMBULATORY_CARE_PROVIDER_SITE_OTHER): Payer: Medicaid Other | Admitting: Family

## 2016-04-04 VITALS — BP 140/88 | HR 98 | Temp 97.2°F | Resp 22 | Ht 68.0 in | Wt 136.0 lb

## 2016-04-04 DIAGNOSIS — I779 Disorder of arteries and arterioles, unspecified: Secondary | ICD-10-CM | POA: Diagnosis not present

## 2016-04-04 DIAGNOSIS — Z95828 Presence of other vascular implants and grafts: Secondary | ICD-10-CM

## 2016-04-04 DIAGNOSIS — F17200 Nicotine dependence, unspecified, uncomplicated: Secondary | ICD-10-CM

## 2016-04-04 NOTE — Patient Instructions (Signed)
Peripheral Vascular Disease Peripheral vascular disease (PVD) is a disease of the blood vessels that are not part of your heart and brain. A simple term for PVD is poor circulation. In most cases, PVD narrows the blood vessels that carry blood from your heart to the rest of your body. This can result in a decreased supply of blood to your arms, legs, and internal organs, like your stomach or kidneys. However, it most often affects a person's lower legs and feet. There are two types of PVD.  Organic PVD. This is the more common type. It is caused by damage to the structure of blood vessels.  Functional PVD. This is caused by conditions that make blood vessels contract and tighten (spasm). Without treatment, PVD tends to get worse over time. PVD can also lead to acute ischemic limb. This is when an arm or limb suddenly has trouble getting enough blood. This is a medical emergency. Follow these instructions at home:  Take medicines only as told by your doctor.  Do not use any tobacco products, including cigarettes, chewing tobacco, or electronic cigarettes. If you need help quitting, ask your doctor.  Lose weight if you are overweight, and maintain a healthy weight as told by your doctor.  Eat a diet that is low in fat and cholesterol. If you need help, ask your doctor.  Exercise regularly. Ask your doctor for some good activities for you.  Take good care of your feet.  Wear comfortable shoes that fit well.  Check your feet often for any cuts or sores. Contact a doctor if:  You have cramps in your legs while walking.  You have leg pain when you are at rest.  You have coldness in a leg or foot.  Your skin changes.  You are unable to get or have an erection (erectile dysfunction).  You have cuts or sores on your feet that are not healing. Get help right away if:  Your arm or leg turns cold and blue.  Your arms or legs become red, warm, swollen, painful, or numb.  You have  chest pain or trouble breathing.  You suddenly have weakness in your face, arm, or leg.  You become very confused or you cannot speak.  You suddenly have a very bad headache.  You suddenly cannot see. This information is not intended to replace advice given to you by your health care provider. Make sure you discuss any questions you have with your health care provider. Document Released: 06/11/2009 Document Revised: 08/23/2015 Document Reviewed: 08/25/2013 Elsevier Interactive Patient Education  2017 Elsevier Inc.      Steps to Quit Smoking Smoking tobacco can be bad for your health. It can also affect almost every organ in your body. Smoking puts you and people around you at risk for many serious long-lasting (chronic) diseases. Quitting smoking is hard, but it is one of the best things that you can do for your health. It is never too late to quit. What are the benefits of quitting smoking? When you quit smoking, you lower your risk for getting serious diseases and conditions. They can include:  Lung cancer or lung disease.  Heart disease.  Stroke.  Heart attack.  Not being able to have children (infertility).  Weak bones (osteoporosis) and broken bones (fractures). If you have coughing, wheezing, and shortness of breath, those symptoms may get better when you quit. You may also get sick less often. If you are pregnant, quitting smoking can help to lower your chances   of having a baby of low birth weight. What can I do to help me quit smoking? Talk with your doctor about what can help you quit smoking. Some things you can do (strategies) include:  Quitting smoking totally, instead of slowly cutting back how much you smoke over a period of time.  Going to in-person counseling. You are more likely to quit if you go to many counseling sessions.  Using resources and support systems, such as:  Online chats with a counselor.  Phone quitlines.  Printed self-help  materials.  Support groups or group counseling.  Text messaging programs.  Mobile phone apps or applications.  Taking medicines. Some of these medicines may have nicotine in them. If you are pregnant or breastfeeding, do not take any medicines to quit smoking unless your doctor says it is okay. Talk with your doctor about counseling or other things that can help you. Talk with your doctor about using more than one strategy at the same time, such as taking medicines while you are also going to in-person counseling. This can help make quitting easier. What things can I do to make it easier to quit? Quitting smoking might feel very hard at first, but there is a lot that you can do to make it easier. Take these steps:  Talk to your family and friends. Ask them to support and encourage you.  Call phone quitlines, reach out to support groups, or work with a counselor.  Ask people who smoke to not smoke around you.  Avoid places that make you want (trigger) to smoke, such as:  Bars.  Parties.  Smoke-break areas at work.  Spend time with people who do not smoke.  Lower the stress in your life. Stress can make you want to smoke. Try these things to help your stress:  Getting regular exercise.  Deep-breathing exercises.  Yoga.  Meditating.  Doing a body scan. To do this, close your eyes, focus on one area of your body at a time from head to toe, and notice which parts of your body are tense. Try to relax the muscles in those areas.  Download or buy apps on your mobile phone or tablet that can help you stick to your quit plan. There are many free apps, such as QuitGuide from the CDC (Centers for Disease Control and Prevention). You can find more support from smokefree.gov and other websites. This information is not intended to replace advice given to you by your health care provider. Make sure you discuss any questions you have with your health care provider. Document Released:  01/11/2009 Document Revised: 11/13/2015 Document Reviewed: 08/01/2014 Elsevier Interactive Patient Education  2017 Elsevier Inc.  

## 2016-04-04 NOTE — Progress Notes (Addendum)
[facility] [place]   CC: Follow up peripheral artery occlusive disease  History of Present Illness Brady Bell is a 61 y.o. male patient of Dr. Imogene Burnhen who is s/p left common femoral artery to anterior tibial artery bypass with non-reversed ipslateral greater saphenous vein (Date: 09/06/11).  He returns today for follow up, after ABI's and left LE arterial duplex were performed on 03-21-16.  He had been homeless for about 2 years, but is now living in an apt.   He now has disability status.  He denies claudication symptoms, denies non healing wounds, his left knee has a medial meniscus tear and he states that he needs surgical repair.  He had a left knee arthroscopic procedure in February 2016, states subsequently he needs another left knee arthroscopic procedure. Pt reports considerable discomfort in his left knee, wears a brace.  He denies history of stroke or TIA.  Pt Diabetic: No Pt smoker: smoker (1/2 ppd x since age 61 with several periods of abstinence)  Pt meds include:  Statin :No, states his cholesterol is good ASA: Yes Other anticoagulants/antiplatelets: no     Past Medical History:  Diagnosis Date  . Chronic ankle pain   . Chronic knee pain   . Hypoglycemia   . Migraines   . Peripheral vascular disease (HCC)   . Seizures [facility](HCC)     Social History Social History  Substance Use Topics  . Smoking status: Light Tobacco Smoker    Packs/day: 0.50    Years: 30.00    Types: Cigarettes  . Smokeless tobacco: Never Used     Comment: pt states that he is trying his best to quit  . Alcohol use 0.0 oz/week     Comment: 3 or 4 times a week    Family History Family History  Problem Relation Age of Onset  . Diabetes    . Alcohol abuse Father     Past Surgical History:  Procedure Laterality Date  . FASCIOTOMY  09/06/2011   Procedure: FASCIOTOMY;  Surgeon: Fransisco HertzBrian L Chen, MD;  [facility];  Service: Vascular;  Laterality:  Left;  . FEMORAL-TIBIAL BYPASS GRAFT  09/06/2011   Procedure: BYPASS GRAFT FEMORAL-TIBIAL ARTERY;  Surgeon: Fransisco HertzBrian L Chen, MD;  [facility];  Service: Vascular;  Laterality: Left;  . LOWER EXTREMITY ANGIOGRAM N/A 09/05/2011   Procedure: LOWER EXTREMITY ANGIOGRAM;  Surgeon: Sherren Kernsharles E Fields, MD;  [facility];  Service: Cardiovascular;  Laterality: N/A;  . LOWER EXTREMITY ANGIOGRAM Left 04/15/2012   Procedure: LOWER EXTREMITY ANGIOGRAM;  Surgeon: Fransisco HertzBrian L Chen, MD;  [facility];  Service: Cardiovascular;  Laterality: Left;  . PR VEIN BYPASS GRAFT,AORTO-FEM-POP  09/06/11   Left   . TOOTH EXTRACTION  June-July-Aug. 2015   Several     No Known Allergies  Current Outpatient Prescriptions  Medication Sig Dispense Refill  . aspirin EC 81 MG tablet Take 81 mg by mouth daily.    Marland Kitchen. gabapentin (NEURONTIN) 300 MG capsule Take 300 mg by mouth 3 (three) times daily.    Marland Kitchen. topiramate (TOPAMAX) 100 MG tablet Take 100 mg by mouth daily.      No current facility-administered medications for this visit.     ROS: See HPI for pertinent positives and negatives.   Physical Examination  Vitals:   04/04/16 1346  BP: 140/88  Pulse: 98  Resp: (!) 22  Temp: 97.2 F (36.2 C)  TempSrc: Oral  SpO2: 96%  Weight: 136 lb (61.7  kg)  Height: 5\' 8"  (1.727 m)   Body mass index is 20.68 kg/m.  General: A&O x 3, WDWN. Thin male. Gait: antalgic, limping, uses a cane  Eyes: PERRLA. Mild/moderate bilateral exophthalmos. Pulmonary: Respirations are non labored, CTAB but somewhat limited air movement, without wheezes , rales or rhonchi.  Cardiac: regular rythm, no detected murmur.   Carotid Bruits Left Right   Negative  Negative    Aorta is not palpable.  Radial pulses: are 2+ palpable and =   VASCULAR EXAM: Extremitieswithout ischemic changes  without Gangrene; without open wounds. No peripheral edema.   LE Pulses  LEFT  RIGHT   FEMORAL   palpable palpable   POPLITEAL  graft palpable laterally  not palpable  POSTERIOR TIBIAL  not palpable  2+palpable   DORSALIS PEDIS ANTERIOR TIBIAL  1+palpable  Not palpable    Abdomen: soft, NT, no palpable masses.  Skin: no rashes, no ulcers.  Musculoskeletal: No muscle wasting or atrophy. No signs of ischemia in extremities. Brace on left knee.  Neurologic: A&O X 3; Appropriate Affect, MOTOR FUNCTION: moving all extremities equally, motor strength 5/5 throughout except 4/5 in left lower extremity. Speech is fluent/normal. CN 2-12 intact     ASSESSMENT: Brady Bell is a 61 y.o. male who is s/p left common femoral artery to anterior tibial artery bypass with non-reversed ipslateral greater saphenous vein (Date: 09/06/11).  He has no claudication sx's with walking, no signs of ischemia in his feet/legs.His walking is limited by left knee pain torn meniscus.    Fortunately he does not have DM, but unfortunately he continues to smoke.  DATA Left LE arterial duplex on 03-21-16 demonstrates no internal vessel narrowing within the bypass graft of anastomosis.  Non significant, partially occlusive plaque in the left distal EIA/proximal CFA region. No significant change from the last exam on 06-10-13.  ABI's 03-21-16: Right: 1.19 (1.26 on 03-09-15), triphasic waveforms. Left: 1.16 (1.13), PT: monophasic, DP: triphasic   PLAN:  Daily seated leg exercises as taught to pt by physical therapy.  Walking daily as tolerated by his left knee injury and per his orthopedic surgeon.   The patient was counseled re smoking cessation and given several free resources re smoking cessation.  Based on the patient's vascular studies and examination, pt will return to clinic in a year for left LE arterial duplex and ABI's.   I advised him to notify us should he develop concerns re the circulation in his legs.   I discussed in depth with the patient the  nature of atherosclerosis, and emphasized the importance of maximal medical management including strict control of blood pressure, blood glucose, and lipid levels, obtaining regular exercise, and cessation of smoking.  The patient is aware that without maximal medical management the underlying atherosclerotic disease process will progress, limiting the benefit of any interventions.  The patient was given information about PAD including signs, symptoms, treatment, what symptoms should prompt the patient to seek immediate medical care, and risk reduction measures to take.  Charisse March, RN, MSN, FNP-C [facility] [place] Phone: [phone]  Clinic MD: Chen/Cain  04/04/16 2:04 PM

## 2016-04-08 NOTE — Addendum Note (Signed)
Addended by: Burton ApleyPETTY, Adlee Paar A on: 04/08/2016 02:58 PM   Modules accepted: Orders

## 2016-12-08 ENCOUNTER — Encounter: Payer: Self-pay | Admitting: Neurology

## 2016-12-08 ENCOUNTER — Ambulatory Visit (INDEPENDENT_AMBULATORY_CARE_PROVIDER_SITE_OTHER): Payer: Medicaid Other | Admitting: Neurology

## 2016-12-08 ENCOUNTER — Encounter (INDEPENDENT_AMBULATORY_CARE_PROVIDER_SITE_OTHER): Payer: Self-pay

## 2016-12-08 VITALS — BP 144/97 | HR 66 | Ht 68.0 in | Wt 134.5 lb

## 2016-12-08 DIAGNOSIS — R51 Headache: Secondary | ICD-10-CM

## 2016-12-08 DIAGNOSIS — I739 Peripheral vascular disease, unspecified: Secondary | ICD-10-CM | POA: Diagnosis not present

## 2016-12-08 DIAGNOSIS — G4452 New daily persistent headache (NDPH): Secondary | ICD-10-CM | POA: Insufficient documentation

## 2016-12-08 DIAGNOSIS — R519 Headache, unspecified: Secondary | ICD-10-CM

## 2016-12-08 MED ORDER — DIVALPROEX SODIUM ER 500 MG PO TB24: 500.0000 mg | ORAL_TABLET | Freq: Every day | ORAL | 11 refills | Status: DC

## 2016-12-08 MED ORDER — SUMATRIPTAN SUCCINATE 6 MG/0.5ML ~~LOC~~ SOAJ: 6.0000 mg | SUBCUTANEOUS | 11 refills | Status: DC | PRN

## 2016-12-08 MED ORDER — SUMATRIPTAN SUCCINATE 100 MG PO TABS: 100.0000 mg | ORAL_TABLET | ORAL | 11 refills | Status: AC | PRN

## 2016-12-08 MED ORDER — ONDANSETRON 4 MG PO TBDP: 4.0000 mg | ORAL_TABLET | Freq: Three times a day (TID) | ORAL | 6 refills | Status: DC | PRN

## 2016-12-08 NOTE — Progress Notes (Signed)
PATIENT: Brady Bell Dicker DOB: 08/23/1955  Chief Complaint  Patient presents with  . Migraine    States he has been having migraines for 30+ years.  However, his frequency has increased since having shingles two months ago.  Marland Kitchen. PCP    Kari BaarsHawkins, Edward, MD     HISTORICAL  Brady Bell Hence  is a 61 year old male, seen in refer by his primary care doctor Kari BaarsHawkins, Edward for evaluation of chronic migraine, initial evaluation was on December 08 2016.  I reviewed and summarized the referring note, he had a history of alcohol and nicotine dependence, peripheral vascular disease,  He reported a history of migraine headaches, severe flareups in 2003, when he was under a lot of stress, which has much improved for many years, until June 2018, he has suffered right lower thoracic shingles, then began to have headache almost every day,  Headache I his typical migraine headache, starting from right occipital region, spreading forward to become severe pounding headache with associated light noise sensitivity, lasting for a few hours, he likes sleeping dark quiet room, he has tried Imitrex, Relpax was limited help, previously Imitrex injection works well,  He complains of gait abnormality due to left knee injury, left peripheral vascular disease, is taking Topamax 100 mg half tablets every day as migraine prevention, reported possible glaucoma,   REVIEW OF SYSTEMS: Full 14 system review of systems performed and notable only for  insomnia  ALLERGIES: No Known Allergies  HOME MEDICATIONS: Current Outpatient Prescriptions  Medication Sig Dispense Refill  . aspirin EC 81 MG tablet Take 81 mg by mouth daily.    Marland Kitchen. eletriptan (RELPAX) 40 MG tablet Take 40 mg by mouth as needed for migraine or headache. May repeat in 2 hours if headache persists or recurs.    . gabapentin (NEURONTIN) 300 MG capsule Take 300 mg by mouth 3 (three) times daily.    . SUMAtriptan (IMITREX) 100 MG tablet Take 100 mg by mouth  every 2 (two) hours as needed for migraine. May repeat in 2 hours if headache persists or recurs.    . topiramate (TOPAMAX) 100 MG tablet Take 100 mg by mouth daily.      No current facility-administered medications for this visit.     PAST MEDICAL HISTORY: Past Medical History:  Diagnosis Date  . Chronic ankle pain   . Chronic knee pain   . Hypoglycemia   . Migraines   . Peripheral vascular disease (HCC)   . Seizures (HCC)   . Shingles     PAST SURGICAL HISTORY: Past Surgical History:  Procedure Laterality Date  . FASCIOTOMY  09/06/2011   Procedure: FASCIOTOMY;  Surgeon: Fransisco HertzBrian Bell Chen, MD;  [facility];  Service: Vascular;  Laterality: Left;  . FEMORAL-TIBIAL BYPASS GRAFT  09/06/2011   Procedure: BYPASS GRAFT FEMORAL-TIBIAL ARTERY;  Surgeon: Fransisco HertzBrian Bell Chen, MD;  [facility];  Service: Vascular;  Laterality: Left;  . LOWER EXTREMITY ANGIOGRAM N/A 09/05/2011   Procedure: LOWER EXTREMITY ANGIOGRAM;  Surgeon: Sherren Kernsharles E Fields, MD;  [facility];  Service: Cardiovascular;  Laterality: N/A;  . LOWER EXTREMITY ANGIOGRAM Left 04/15/2012   Procedure: LOWER EXTREMITY ANGIOGRAM;  Surgeon: Fransisco HertzBrian Bell Chen, MD;  [facility];  Service: Cardiovascular;  Laterality: Left;  . PR VEIN BYPASS GRAFT,AORTO-FEM-POP  09/06/11   Left   . TOOTH EXTRACTION  June-July-Aug. 2015   Several     FAMILY HISTORY: Family History  Problem Relation Age of Onset  .  Alcohol abuse Father   . Kidney failure Father   . Other Mother        "age"  . Diabetes Unknown     SOCIAL HISTORY:  Social History   Social History  . Marital status: Single    Spouse name: N/A  . Number of children: 0  . Years of education: some college   Occupational History  . Disabled    Social History Main Topics  . Smoking status: Current Every Day Smoker    Packs/day: 0.50    Years: 30.00    Types: Cigarettes  . Smokeless tobacco: Never Used     Comment: pt states that he is trying his best to quit  .  Alcohol use 0.0 oz/week     Comment: Few drinks per day  . Drug use: Yes    Types: Marijuana     Comment: occasional use  . Sexual activity: Yes   Other Topics Concern  . Not on file   Social History Narrative   Lives alone.   Left-handed.   2-3 cups caffeine per day.     PHYSICAL EXAM   Vitals:   12/08/16 0949  BP: (!) 144/97  Pulse: 66  Weight: 134 lb 8 oz (61 kg)  Height:  (1.727 m)    Not recorded      Body mass index is 20.45 kg/m.  PHYSICAL EXAMNIATION:  Gen: NAD, conversant, well nourised, obese, well groomed                     Cardiovascular: Regular rate rhythm, no peripheral edema, warm, nontender. Eyes: Conjunctivae clear without exudates or hemorrhage Neck: Supple, no carotid bruits. Pulmonary: Clear to auscultation bilaterally   NEUROLOGICAL EXAM:  MENTAL STATUS: Speech:    Speech is normal; fluent and spontaneous with normal comprehension.  Cognition:     Orientation to time, place and person     Normal recent and remote memory     Normal Attention span and concentration     Normal Language, naming, repeating,spontaneous speech     Fund of knowledge   CRANIAL NERVES: CN II: Visual fields are full to confrontation. Fundoscopic exam is normal with sharp discs and no vascular changes. Pupils are round equal and briskly reactive to light. CN III, IV, VI: extraocular movement are normal. No ptosis. CN V: Facial sensation is intact to pinprick in all 3 divisions bilaterally. Corneal responses are intact.  CN VII: Face is symmetric with normal eye closure and smile. CN VIII: Hearing is normal to rubbing fingers CN IX, X: Palate elevates symmetrically. Phonation is normal. CN XI: Head turning and shoulder shrug are intact CN XII: Tongue is midline with normal movements and no atrophy.  MOTOR: There is no pronator drift of out-stretched arms. Muscle bulk and tone are normal. Muscle strength is normal.  REFLEXES: Reflexes are 2+ and  symmetric at the biceps, triceps, knees, and ankles. Plantar responses are flexor.  SENSORY: Intact to light touch, pinprick, positional sensation and vibratory sensation are intact in fingers and toes.  COORDINATION: Rapid alternating movements and fine finger movements are intact. There is no dysmetria on finger-to-nose and heel-knee-shin.    GAIT/STANCE: Posture is normal. Gait is steady with normal steps, base, arm swing, and turning. Heel and toe walking are normal. Tandem gait is normal.  Romberg is absent.   DIAGNOSTIC DATA (LABS, IMAGING, TESTING) - I reviewed patient records, labs, notes, testing and imaging myself where available.  ASSESSMENT AND PLAN  [place] CURRENT is a 61 y.o. male   chronic migraine headache  Worsening new onset daily headache  Laboratory evaluations to rule out temporal arteritis  MRI of the brain to rule out structural lesions  Depakote ER 500 mg every night as migraine prevention, Imitrex subcutaneous injection for severe migraine headache, Imitrex tablets for moderate headache   Levert Feinstein, M.D. Ph.D.  [facility] [address], [address] [place], [place] [address] Ph: [phone] Fax: [phone]  CC: Kari Baars, MD

## 2016-12-08 NOTE — Patient Instructions (Signed)
You may combine Imitrex  Together with Zofran 4 mg as needed Aleve 1-2 tablets as needed

## 2016-12-10 ENCOUNTER — Telehealth: Payer: Self-pay | Admitting: Neurology

## 2016-12-10 DIAGNOSIS — D582 Other hemoglobinopathies: Secondary | ICD-10-CM

## 2016-12-10 LAB — COMPREHENSIVE METABOLIC PANEL
ALK PHOS: 113 IU/L (ref 39–117)
ALT: 17 IU/L (ref 0–44)
AST: 29 IU/L (ref 0–40)
Albumin/Globulin Ratio: 1.3 (ref 1.2–2.2)
Albumin: 4.6 g/dL (ref 3.5–5.5)
BILIRUBIN TOTAL: 0.7 mg/dL (ref 0.0–1.2)
BUN/Creatinine Ratio: 11 (ref 9–20)
BUN: 12 mg/dL (ref 6–24)
CHLORIDE: 92 mmol/L — AB (ref 96–106)
CO2: 25 mmol/L (ref 20–29)
Calcium: 10.4 mg/dL — ABNORMAL HIGH (ref 8.7–10.2)
Creatinine, Ser: 1.09 mg/dL (ref 0.76–1.27)
GFR calc non Af Amer: 74 mL/min/{1.73_m2} (ref >59)
GFR, EST AFRICAN AMERICAN: 85 mL/min/{1.73_m2} (ref >59)
GLUCOSE: 82 mg/dL (ref 65–99)
Globulin, Total: 3.5 g/dL (ref 1.5–4.5)
POTASSIUM: 3.8 mmol/L (ref 3.5–5.2)
Sodium: 135 mmol/L (ref 134–144)
TOTAL PROTEIN: 8.1 g/dL (ref 6.0–8.5)

## 2016-12-10 LAB — CBC
HEMATOCRIT: 58.3 % — AB (ref 37.5–51.0)
Hemoglobin: 20.2 g/dL (ref 13.0–17.7)
MCH: 34.9 pg — AB (ref 26.6–33.0)
MCHC: 34.6 g/dL (ref 31.5–35.7)
MCV: 101 fL — AB (ref 79–97)
PLATELETS: 168 10*3/uL (ref 150–379)
RBC: 5.78 x10E6/uL (ref 4.14–5.80)
RDW: 14.2 % (ref 12.3–15.4)
WBC: 6.5 10*3/uL (ref 3.4–10.8)

## 2016-12-10 LAB — VITAMIN B12: Vitamin B-12: 1007 pg/mL (ref 232–1245)

## 2016-12-10 LAB — C-REACTIVE PROTEIN: CRP: 3.3 mg/L (ref 0.0–4.9)

## 2016-12-10 LAB — CK: Total CK: 72 U/L (ref 24–204)

## 2016-12-10 LAB — ANA W/REFLEX: ANA: NEGATIVE

## 2016-12-10 LAB — PSA: Prostate Specific Ag, Serum: 1.3 ng/mL (ref 0.0–4.0)

## 2016-12-10 LAB — TSH: TSH: 2.09 u[IU]/mL (ref 0.450–4.500)

## 2016-12-10 LAB — RPR: RPR: NONREACTIVE

## 2016-12-10 LAB — SEDIMENTATION RATE: SED RATE: 2 mm/h (ref 0–30)

## 2016-12-10 NOTE — Telephone Encounter (Signed)
Please call patient, laboratory evaluation showed significantly elevated hemoglobin 20, elevated hematocrit 58, I will refer him to hematologist

## 2016-12-10 NOTE — Telephone Encounter (Signed)
He is aware of results and agreeable to the hematology referral. Order already placed in Epic.

## 2016-12-11 NOTE — Telephone Encounter (Signed)
Pt is asking for a call back, he states he did not write down the information that was provided to him on yesterday.  Please call

## 2016-12-11 NOTE — Telephone Encounter (Signed)
Spoke to patient and reviewed lab results again so he could write them down.  He is aware to expect a call for an appt with hematology.

## 2016-12-22 ENCOUNTER — Ambulatory Visit
Admission: RE | Admit: 2016-12-22 | Discharge: 2016-12-22 | Disposition: A | Discharge status: Home / Self Care | Payer: Medicaid Other | Source: Ambulatory Visit | Attending: Neurology | Admitting: Neurology

## 2016-12-22 ENCOUNTER — Telehealth: Payer: Self-pay | Admitting: Neurology

## 2016-12-22 DIAGNOSIS — R519 Headache, unspecified: Secondary | ICD-10-CM

## 2016-12-22 DIAGNOSIS — R51 Headache: Principal | ICD-10-CM

## 2016-12-22 MED ORDER — ALPRAZOLAM 1 MG PO TABS: ORAL_TABLET | ORAL | 0 refills | Status: DC

## 2016-12-22 NOTE — Telephone Encounter (Signed)
Ok per Dr. Terrace Arabia to provide Xanax, per MRI protocol.  Prescription sent to pharmacy and patient aware.

## 2016-12-22 NOTE — Telephone Encounter (Signed)
Patient called office in trying to have MRI done this morning and was not able to complete due to freaking out a little.  Patient states he hasn't had this happen in a while, but has had to have xanax prescribed in the past.  He would like to know if he is able to have something prescribed to help him get the MRI completed.  [facility].  Please call

## 2016-12-22 NOTE — Addendum Note (Signed)
Addended by: Lindell Spar C on: 12/22/2016 02:07 PM   Modules accepted: Orders

## 2017-01-06 ENCOUNTER — Inpatient Hospital Stay
Admission: RE | Admit: 2017-01-06 | Discharge: 2017-01-06 | Disposition: A | Discharge status: Home / Self Care | Payer: Medicaid Other | Source: Ambulatory Visit | Attending: Neurology | Admitting: Neurology

## 2017-01-06 ENCOUNTER — Ambulatory Visit
Admission: RE | Admit: 2017-01-06 | Discharge: 2017-01-06 | Disposition: A | Discharge status: Home / Self Care | Payer: Medicaid Other | Source: Ambulatory Visit | Attending: Neurology | Admitting: Neurology

## 2017-01-06 ENCOUNTER — Other Ambulatory Visit: Payer: Self-pay | Admitting: Neurology

## 2017-01-06 DIAGNOSIS — R51 Headache: Principal | ICD-10-CM

## 2017-01-06 DIAGNOSIS — R519 Headache, unspecified: Secondary | ICD-10-CM

## 2017-01-07 ENCOUNTER — Ambulatory Visit (HOSPITAL_COMMUNITY): Payer: Self-pay

## 2017-01-08 ENCOUNTER — Telehealth: Payer: Self-pay | Admitting: Neurology

## 2017-01-08 DIAGNOSIS — G4452 New daily persistent headache (NDPH): Secondary | ICD-10-CM

## 2017-01-08 NOTE — Telephone Encounter (Signed)
Please call patient, MRI of the brain showed no significant intracranial abnormality, there is evidence of moderate extensive paraspinous inflammatory mucous disease,  Please check with him, if he has signs of nasal discharge, stuffiness, and may refer him to ENT for further evaluation   IMPRESSION: 1. No acute intracranial abnormality or mass. 2. Punctate focus of right periatrial white matter signal abnormality, possibly subacute or chronic small vessel ischemia. 3. 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

## 2017-01-12 NOTE — Telephone Encounter (Signed)
I called pt. I advised him that his MRI brain did not show any significant intracranial abnormality, but there is evidence of moderate extensive paraspinous inflammatory mucous disease. Pt does confirm signs of nasal discharge and stuffiness and is agreeable to a referral to ENT. I reminded pt of his appt with Dr. Terrace Arabia on 02/11/17. Pt knows that an ENT office will contact him to schedule an appt. Pt verbalized understanding of results. Pt had no questions at this time but was encouraged to call back if questions arise.

## 2017-01-12 NOTE — Addendum Note (Signed)
Addended by: Geronimo Running A on: 01/12/2017 10:35 AM   Modules accepted: Orders

## 2017-01-19 ENCOUNTER — Ambulatory Visit (HOSPITAL_COMMUNITY): Payer: Self-pay

## 2017-02-09 ENCOUNTER — Encounter (HOSPITAL_COMMUNITY): Payer: Self-pay | Admitting: Oncology

## 2017-02-09 ENCOUNTER — Other Ambulatory Visit: Payer: Self-pay

## 2017-02-09 ENCOUNTER — Encounter (HOSPITAL_COMMUNITY): Payer: Medicaid Other

## 2017-02-09 ENCOUNTER — Encounter (HOSPITAL_COMMUNITY): Payer: Medicaid Other | Attending: Oncology | Admitting: Oncology

## 2017-02-09 VITALS — BP 146/88 | HR 92 | Temp 98.4°F | Resp 18 | Wt 141.4 lb

## 2017-02-09 DIAGNOSIS — D582 Other hemoglobinopathies: Secondary | ICD-10-CM

## 2017-02-09 DIAGNOSIS — R718 Other abnormality of red blood cells: Secondary | ICD-10-CM | POA: Diagnosis not present

## 2017-02-09 DIAGNOSIS — F17210 Nicotine dependence, cigarettes, uncomplicated: Secondary | ICD-10-CM | POA: Diagnosis not present

## 2017-02-09 DIAGNOSIS — Z79899 Other long term (current) drug therapy: Secondary | ICD-10-CM | POA: Diagnosis not present

## 2017-02-09 DIAGNOSIS — Z7982 Long term (current) use of aspirin: Secondary | ICD-10-CM | POA: Insufficient documentation

## 2017-02-09 NOTE — Progress Notes (Signed)
[facility] Initial Visit:  Patient Care Team: Kari BaarsHawkins, Edward, MD as PCP - General Lincoln Brighamurham, Christopher, DDS as Consulting Physician (Oral Surgery) Vickki HearingHarrison, Stanley E, MD as Consulting Physician (Orthopedic Surgery)  CHIEF COMPLAINTS/PURPOSE OF CONSULTATION:  Elevated hematocrit  HISTORY OF PRESENTING ILLNESS: Brady FabryKenneth L Mullens 61 y.o. male presents today for evaluation of elevated hematocrit.  He was referred by his neurologist Dr.Yan who he sees for his chronic migraines.  He had a CBC performed on 12/08/2016 which demonstrated WBC 6.5K, hemoglobin 20.2 g/dL, hematocrit 19.1%58.3%, MCV 101, platelet count 168K.  Last CBC from 01/05/2015 demonstrated hemoglobin 16.9 g/dL, hematocrit 47.8%49.5%.  Patient is a chronic smoker has smoked on and off for 40 years, and he currently smokes half a pack per day.  He denies any testosterone replacement, or sleep apnea.  He still has neuropathic pain from shingles which he had about 6 months ago.  He states his migraines are better than before and he gets them about a few times a month now.  He denies any chest pain, shortness of breath, abdominal pain, fatigue, focal weakness, weight loss, night sweats.  Review of Systems - Oncology ROS as per HPI otherwise 12 point ROS is negative.  MEDICAL HISTORY: Past Medical History:  Diagnosis Date  . Chronic ankle pain   . Chronic knee pain   . Hypoglycemia   . Migraines   . Peripheral vascular disease (HCC)   . Seizures (HCC)   . Shingles     SURGICAL HISTORY: Past Surgical History:  Procedure Laterality Date  . PR VEIN BYPASS GRAFT,AORTO-FEM-POP  09/06/11   Left   . TOOTH EXTRACTION  June-July-Aug. 2015   Several     SOCIAL HISTORY: Social History   Socioeconomic History  . Marital status: Single    Spouse name: Not on file  . Number of children: 0  . Years of education: some college  . Highest education level: Not on file  Social Needs  . Financial resource strain: Not on file  .  Food insecurity - worry: Not on file  . Food insecurity - inability: Not on file  . Transportation needs - medical: Not on file  . Transportation needs - non-medical: Not on file  Occupational History  . Occupation: Disabled  Tobacco Use  . Smoking status: Current Every Day Smoker    Packs/day: 0.50    Years: 30.00    Pack years: 15.00    Types: Cigarettes  . Smokeless tobacco: Never Used  . Tobacco comment: pt states that he is trying his best to quit  Substance and Sexual Activity  . Alcohol use: Yes    Alcohol/week: 0.0 oz    Comment: Few drinks per day  . Drug use: Yes    Types: Marijuana    Comment: occasional use  . Sexual activity: Yes  Other Topics Concern  . Not on file  Social History Narrative   Lives alone.   Left-handed.   2-3 cups caffeine per day.    FAMILY HISTORY Family History  Problem Relation Age of Onset  . Alcohol abuse Father   . Kidney failure Father   . Other Mother        "age"  . Diabetes Unknown     ALLERGIES:  has No Known Allergies.  MEDICATIONS:  Current Outpatient Medications  Medication Sig Dispense Refill  . ALPRAZolam (XANAX) 1 MG tablet Take 1-2 tablets thirty minutes prior to MRI.  May take one additional tablet before entering scanner,  if needed.  MUST HAVE DRIVER. 3 tablet 0  . aspirin EC 81 MG tablet Take 81 mg by mouth daily.    . divalproex (DEPAKOTE ER) 500 MG 24 hr tablet Take 1 tablet (500 mg total) by mouth at bedtime. 30 tablet 11  . eletriptan (RELPAX) 40 MG tablet Take 40 mg by mouth as needed for migraine or headache. May repeat in 2 hours if headache persists or recurs.    . gabapentin (NEURONTIN) 300 MG capsule Take 300 mg by mouth 3 (three) times daily.    . ondansetron (ZOFRAN ODT) 4 MG disintegrating tablet Take 1 tablet (4 mg total) by mouth every 8 (eight) hours as needed. 20 tablet 6  . SUMAtriptan (IMITREX STATDOSE SYSTEM) 6 MG/0.5ML SOAJ Inject 6 mg into the skin as needed. 12 Cartridge 11  . SUMAtriptan  (IMITREX) 100 MG tablet Take 1 tablet (100 mg total) by mouth every 2 (two) hours as needed for migraine. May repeat in 2 hours if headache persists or recurs. 12 tablet 11   No current facility-administered medications for this visit.     PHYSICAL EXAMINATION:  ECOG PERFORMANCE STATUS: 0 - Asymptomatic  RN vitals reviewed.  Physical Exam Constitutional: Well-developed, well-nourished, and in no distress.   HENT:  Head: Normocephalic and atraumatic.  Mouth/Throat: No oropharyngeal exudate. Mucosa moist. Eyes: Pupils are equal, round, and reactive to light. Conjunctivae are normal. No scleral icterus.  Neck: Normal range of motion. Neck supple. No JVD present.  Cardiovascular: Normal rate, regular rhythm and normal heart sounds.  Exam reveals no gallop and no friction rub.   No murmur heard. Pulmonary/Chest: Effort normal and breath sounds normal. No respiratory distress. No wheezes.No rales.  Abdominal: Soft. Bowel sounds are normal. No distension. There is no tenderness. There is no guarding.  Musculoskeletal: No edema or tenderness.  Lymphadenopathy:    No cervical or supraclavicular adenopathy.  Neurological: Alert and oriented to person, place, and time. No cranial nerve deficit.  Skin: Skin is warm and dry. No rash noted. No erythema. No pallor.  Psychiatric: Affect and judgment normal.    LABORATORY DATA: I have personally reviewed the data as listed:  No visits with results within 1 Month(s) from this visit.  Latest known visit with results is:  Office Visit on 12/08/2016  Component Date Value Ref Range Status  . Anit Nuclear Antibody(ANA) 12/08/2016 Negative  Negative Final  . RPR Ser Ql 12/08/2016 Non Reactive  Non Reactive Final  . Vitamin B-12 12/08/2016 1,007  232 - 1,245 pg/mL Final  . CRP 12/08/2016 3.3  0.0 - 4.9 mg/L Final  . TSH 12/08/2016 2.090  0.450 - 4.500 uIU/mL Final  . Total CK 12/08/2016 72  24 - 204 U/L Final  . Prostate Specific Ag, Serum  12/08/2016 1.3  0.0 - 4.0 ng/mL Final   Comment: Roche ECLIA methodology. According to the American Urological Association, Serum PSA should decrease and remain at undetectable levels after radical prostatectomy. The AUA defines biochemical recurrence as an initial PSA value 0.2 ng/mL or greater followed by a subsequent confirmatory PSA value 0.2 ng/mL or greater. Values obtained with different assay methods or kits cannot be used interchangeably. Results cannot be interpreted as absolute evidence of the presence or absence of malignant disease.   . Sed Rate 12/08/2016 2  0 - 30 mm/hr Final  . WBC 12/08/2016 6.5  3.4 - 10.8 x10E3/uL Final  . RBC 12/08/2016 5.78  4.14 - 5.80 x10E6/uL Final  . Hemoglobin 12/08/2016  20.2* 13.0 - 17.7 g/dL Final   Comment: Removal of plasma from EDTA tube will result in spuriously elevated CBC results. Redraw and repeat if results do not correlate with patient's clinical condition.   . Hematocrit 12/08/2016 58.3* 37.5 - 51.0 % Final  . MCV 12/08/2016 101* 79 - 97 fL Final  . MCH 12/08/2016 34.9* 26.6 - 33.0 pg Final  . MCHC 12/08/2016 34.6  31.5 - 35.7 g/dL Final  . RDW 16/10/960409/12/2016 14.2  12.3 - 15.4 % Final  . Platelets 12/08/2016 168  150 - 379 x10E3/uL Final  . Glucose 12/08/2016 82  65 - 99 mg/dL Final  . BUN 54/09/811909/12/2016 12  6 - 24 mg/dL Final  . Creatinine, Ser 12/08/2016 1.09  0.76 - 1.27 mg/dL Final  . GFR calc non Af Amer 12/08/2016 74  >59 mL/min/1.73 Final  . GFR calc Af Amer 12/08/2016 85  >59 mL/min/1.73 Final  . BUN/Creatinine Ratio 12/08/2016 11  9 - 20 Final  . Sodium 12/08/2016 135  134 - 144 mmol/L Final  . Potassium 12/08/2016 3.8  3.5 - 5.2 mmol/L Final  . Chloride 12/08/2016 92* 96 - 106 mmol/L Final  . CO2 12/08/2016 25  20 - 29 mmol/L Final  . Calcium 12/08/2016 10.4* 8.7 - 10.2 mg/dL Final  . Total Protein 12/08/2016 8.1  6.0 - 8.5 g/dL Final  . Albumin 14/78/295609/12/2016 4.6  3.5 - 5.5 g/dL Final  . Globulin, Total 12/08/2016 3.5  1.5  - 4.5 g/dL Final  . Albumin/Globulin Ratio 12/08/2016 1.3  1.2 - 2.2 Final  . Bilirubin Total 12/08/2016 0.7  0.0 - 1.2 mg/dL Final  . Alkaline Phosphatase 12/08/2016 113  39 - 117 IU/L Final  . AST 12/08/2016 29  0 - 40 IU/L Final  . ALT 12/08/2016 17  0 - 44 IU/L Final    RADIOGRAPHIC STUDIES: I have personally reviewed the radiological images as listed and agree with the findings in the report  No results found.  ASSESSMENT/PLAN 61 year old male who is a chronic smoker presents for evaluation of elevated hemoglobin/hematocrit.  PLAN: -Will check a JAK2 mutation and erythropoietin level to rule out myeloproliferative disorder with primary polycythemia vera. -Repeat CBC, CMP. 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 -RTC in 4 weeks for follow up with review of labs and to discuss the next plan of care.   Orders Placed This Encounter  Procedures  . JAK2 V617F, w Reflex to CALR/E12/MPL  . CBC with Differential    Standing Status:   Future    Standing Expiration Date:   02/09/2018  . Comprehensive metabolic panel    Standing Status:   Future    Standing Expiration Date:   02/09/2018  . Erythropoietin    Standing Status:   Future    Standing Expiration Date:   02/09/2018    All questions were answered. The patient knows to call the clinic with any problems, questions or concerns.  This note was electronically signed.    Ralene CorkLouise Toshiye Kever, MD  02/09/2017 9:12 AM

## 2017-02-09 NOTE — Patient Instructions (Signed)
[facility] at [facility] Discharge Instructions  RECOMMENDATIONS MADE BY THE CONSULTANT AND ANY TEST RESULTS WILL BE SENT TO YOUR REFERRING PHYSICIAN.  You were seen today by Dr. Ralene CorkLouise Zhou Lab work today Follow up in 4 weeks   Thank you for choosing [facility] at [facility] to provide your oncology and hematology care.  To afford each patient quality time with our provider, please arrive at least 15 minutes before your scheduled appointment time.    If you have a lab appointment with the Cancer Center please come in thru the  Main Entrance and check in at the main information desk  You need to re-schedule your appointment should you arrive 10 or more minutes late.  We strive to give you quality time with our providers, and arriving late affects you and other patients whose appointments are after yours.  Also, if you no show three or more times for appointments you may be dismissed from the clinic at the providers discretion.     Again, thank you for choosing [facility].  Our hope is that these requests will decrease the amount of time that you wait before being seen by our physicians.       _____________________________________________________________  Should you have questions after your visit to [facility], please contact our office at [phone] between the hours of 8:30 a.m. and 4:30 p.m.  Voicemails left after 4:30 p.m. will not be returned until the following business day.  For prescription refill requests, have your pharmacy contact our office.       Resources For Cancer Patients and their Caregivers ? American Cancer Society: Can assist with transportation, wigs, general needs, runs Look Good Feel Better.        [phone] ? Cancer Care: Provides financial assistance, online support groups, medication/co-pay assistance.  1-800-813-HOPE 760-302-4929(4673) ? [facility] Assists  [place] cancer patients and their families through emotional , educational and financial support.  406-427-21924017569384 ? [place] DSS Where to apply for food stamps, Medicaid and utility assistance. (364)600-5851208-108-7342 ? [facility]: Transportation to medical appointments. 312 143 7990209-022-1044 ? Social Security Administration: May apply for disability if have a Stage IV cancer. 206-090-9325249 [phone] ([phone] ? [place] [employer], Disability and Transit Services: Assists with nutrition, care and transit needs. 8547760070(667)443-5102  Cancer Center Support Programs: @10RELATIVEDAYS @ > Cancer Support Group  2nd Tuesday of the month 1pm-2pm, Journey Room  > Creative Journey  3rd Tuesday of the month 1130am-1pm, Journey Room  > Look Good Feel Better  1st Wednesday of the month 10am-12 noon, Journey Room (Call American Cancer Society to register [phone])

## 2017-02-11 ENCOUNTER — Encounter: Payer: Self-pay | Admitting: Neurology

## 2017-02-11 ENCOUNTER — Ambulatory Visit: Payer: Medicaid Other | Admitting: Neurology

## 2017-02-11 VITALS — BP 130/84 | HR 142 | Ht 68.0 in | Wt 143.0 lb

## 2017-02-11 DIAGNOSIS — G43709 Chronic migraine without aura, not intractable, without status migrainosus: Secondary | ICD-10-CM

## 2017-02-11 DIAGNOSIS — IMO0002 Reserved for concepts with insufficient information to code with codable children: Secondary | ICD-10-CM | POA: Insufficient documentation

## 2017-02-11 MED ORDER — DIVALPROEX SODIUM ER 500 MG PO TB24: 500.0000 mg | ORAL_TABLET | Freq: Every day | ORAL | 4 refills | Status: DC

## 2017-02-11 NOTE — Progress Notes (Signed)
PATIENT: Brady Bell DOB: [birth year]  Chief Complaint  Patient presents with  . Migraine    He would like to review his MRI.  Feels his headaches have improved with daily Depakote. He was unable to get sumatriptan injections but did pick up the tablets.  Says he is also getting Relpax and Maxalt from his PCP.  He is still waiting to see the ENT.     HISTORICAL  Brady Bell  is a 61 year old male, seen in refer by his primary care doctor Sinda Du for evaluation of chronic migraine, initial evaluation was on December 08 2016.  I reviewed and summarized the referring note, he had a history of alcohol and nicotine dependence, peripheral vascular disease,  He reported a history of migraine headaches, severe flareups in 2003, when he was under a lot of stress, which has much improved for many years, until June 2018, he has suffered right lower thoracic shingles, then began to have headache almost every day,  His typical migraine headache, starting from right occipital region, spreading forward to become severe pounding headache with associated light noise sensitivity, lasting for a few hours, he likes sleeping dark quiet room, he has tried Imitrex, Relpax was limited help, previously Imitrex injection works well,  He complains of gait abnormality due to left knee injury, left peripheral vascular disease, is taking Topamax 100 mg half tablets every day as migraine prevention, reported possible glaucoma,  UPDATE Feb 11 2017: Have personally reviewed MRI of the brain without contrast January 06 2017, no acute abnormality,  Laboratory evaluation showed normal ESR, C-reactive protein, TSH, CPK, vitamin B2, RPR, ANA,, CMP, CBC  Depakote ER has helped his headaches, he is now taking Imitrex 129m as needed, takes away his headache in 15 minutes,   REVIEW OF SYSTEMS: Full 14 system review of systems performed and notable only for  insomnia  ALLERGIES: No Known Allergies  HOME  MEDICATIONS: Current Outpatient Medications  Medication Sig Dispense Refill  . ALPRAZolam (XANAX) 1 MG tablet Take 1-2 tablets thirty minutes prior to MRI.  May take one additional tablet before entering scanner, if needed.  MUST HAVE DRIVER. 3 tablet 0  . aspirin EC 81 MG tablet Take 81 mg by mouth daily.    . divalproex (DEPAKOTE ER) 500 MG 24 hr tablet Take 1 tablet (500 mg total) by mouth at bedtime. 30 tablet 11  . eletriptan (RELPAX) 40 MG tablet Take 40 mg by mouth as needed for migraine or headache. May repeat in 2 hours if headache persists or recurs.    . gabapentin (NEURONTIN) 300 MG capsule Take 300 mg by mouth 3 (three) times daily.    . ondansetron (ZOFRAN ODT) 4 MG disintegrating tablet Take 1 tablet (4 mg total) by mouth every 8 (eight) hours as needed. 20 tablet 6  . rizatriptan (MAXALT) 10 MG tablet Take 10 mg as needed by mouth for migraine. May repeat in 2 hours if needed    . SUMAtriptan (IMITREX STATDOSE SYSTEM) 6 MG/0.5ML SOAJ Inject 6 mg into the skin as needed. 12 Cartridge 11  . SUMAtriptan (IMITREX) 100 MG tablet Take 1 tablet (100 mg total) by mouth every 2 (two) hours as needed for migraine. May repeat in 2 hours if headache persists or recurs. 12 tablet 11   No current facility-administered medications for this visit.     PAST MEDICAL HISTORY: Past Medical History:  Diagnosis Date  . Chronic ankle pain   . Chronic knee pain   .  Hypoglycemia   . Migraines   . Peripheral vascular disease ([place])   . Seizures ([place])   . Shingles     PAST SURGICAL HISTORY: Past Surgical History:  Procedure Laterality Date  . PR VEIN BYPASS GRAFT,AORTO-FEM-POP  09/06/11   Left   . TOOTH EXTRACTION  June-July-Aug. 2015   Several     FAMILY HISTORY: Family History  Problem Relation Age of Onset  . Alcohol abuse Father   . Kidney failure Father   . Other Mother        "age"  . Diabetes Unknown     SOCIAL HISTORY:  Social History   Socioeconomic History  . Marital  status: Single    Spouse name: Not on file  . Number of children: 0  . Years of education: some college  . Highest education level: Not on file  Social Needs  . Financial resource strain: Not on file  . Food insecurity - worry: Not on file  . Food insecurity - inability: Not on file  . Transportation needs - medical: Not on file  . Transportation needs - non-medical: Not on file  Occupational History  . Occupation: Disabled  Tobacco Use  . Smoking status: Current Every Day Smoker    Packs/day: 0.50    Years: 30.00    Pack years: 15.00    Types: Cigarettes  . Smokeless tobacco: Never Used  . Tobacco comment: pt states that he is trying his best to quit  Substance and Sexual Activity  . Alcohol use: Yes    Alcohol/week: 0.0 oz    Comment: Few drinks per day  . Drug use: Yes    Types: Marijuana    Comment: occasional use  . Sexual activity: Yes  Other Topics Concern  . Not on file  Social History Narrative   Lives alone.   Left-handed.   2-3 cups caffeine per day.     PHYSICAL EXAM   Vitals:   02/11/17 0950  BP: 130/84  Pulse: (!) 142  Weight: 143 lb (64.9 kg)  Height: 5' 8"  (1.727 m)    Not recorded      Body mass index is 21.74 kg/m.  PHYSICAL EXAMNIATION:  Gen: NAD, conversant, well nourised, obese, well groomed                     Cardiovascular: Regular rate rhythm, no peripheral edema, warm, nontender. Eyes: Conjunctivae clear without exudates or hemorrhage Neck: Supple, no carotid bruits. Pulmonary: Clear to auscultation bilaterally   NEUROLOGICAL EXAM:  MENTAL STATUS: Speech:    Speech is normal; fluent and spontaneous with normal comprehension.  Cognition:     Orientation to time, place and person     Normal recent and remote memory     Normal Attention span and concentration     Normal Language, naming, repeating,spontaneous speech     Fund of knowledge   CRANIAL NERVES: CN II: Visual fields are full to confrontation. Fundoscopic exam  is normal with sharp discs and no vascular changes. Pupils are round equal and briskly reactive to light. CN III, IV, VI: extraocular movement are normal. No ptosis. CN V: Facial sensation is intact to pinprick in all 3 divisions bilaterally. Corneal responses are intact.  CN VII: Face is symmetric with normal eye closure and smile. CN VIII: Hearing is normal to rubbing fingers CN IX, X: Palate elevates symmetrically. Phonation is normal. CN XI: Head turning and shoulder shrug are intact CN XII: Tongue is midline  with normal movements and no atrophy.  MOTOR: There is no pronator drift of out-stretched arms. Muscle bulk and tone are normal. Muscle strength is normal.  REFLEXES: Reflexes are 2+ and symmetric at the biceps, triceps, knees, and ankles. Plantar responses are flexor.  SENSORY: Intact to light touch, pinprick, positional sensation and vibratory sensation are intact in fingers and toes.  COORDINATION: Rapid alternating movements and fine finger movements are intact. There is no dysmetria on finger-to-nose and heel-knee-shin.    GAIT/STANCE: Posture is normal. Gait is steady with normal steps, base, arm swing, and turning. Heel and toe walking are normal. Tandem gait is normal.  Romberg is absent.   DIAGNOSTIC DATA (LABS, IMAGING, TESTING) - I reviewed patient records, labs, notes, testing and imaging myself where available.   ASSESSMENT AND PLAN  Brady Bell is a 61 y.o. male   chronic migraine headache  Doing well on Depakote ER 500 mg every day  Imitrex 100 mg plus Zofran as needed for abortive treatment  Chronic low back pain  Under pain management Marcial Pacas, M.D. Ph.D.  [facility] [address] [place], [place] [address] Ph: [phone] Fax: [phone]  CC: Sinda Du, MD

## 2017-02-16 ENCOUNTER — Ambulatory Visit (INDEPENDENT_AMBULATORY_CARE_PROVIDER_SITE_OTHER): Payer: Medicaid Other | Admitting: Otolaryngology

## 2017-02-16 DIAGNOSIS — J322 Chronic ethmoidal sinusitis: Secondary | ICD-10-CM | POA: Diagnosis not present

## 2017-02-16 DIAGNOSIS — J320 Chronic maxillary sinusitis: Secondary | ICD-10-CM | POA: Diagnosis not present

## 2017-02-16 DIAGNOSIS — J321 Chronic frontal sinusitis: Secondary | ICD-10-CM

## 2017-02-16 DIAGNOSIS — J323 Chronic sphenoidal sinusitis: Secondary | ICD-10-CM

## 2017-02-18 LAB — CALR + JAK2 E12-15 + MPL (REFLEXED)

## 2017-02-18 LAB — JAK2 V617F, W REFLEX TO CALR/E12/MPL

## 2017-02-23 ENCOUNTER — Other Ambulatory Visit (INDEPENDENT_AMBULATORY_CARE_PROVIDER_SITE_OTHER): Payer: Self-pay | Admitting: Otolaryngology

## 2017-02-23 DIAGNOSIS — J320 Chronic maxillary sinusitis: Secondary | ICD-10-CM

## 2017-03-03 ENCOUNTER — Ambulatory Visit (HOSPITAL_COMMUNITY)
Admission: RE | Admit: 2017-03-03 | Discharge: 2017-03-03 | Disposition: A | Discharge status: Home / Self Care | Payer: Medicaid Other | Source: Ambulatory Visit | Attending: Otolaryngology | Admitting: Otolaryngology

## 2017-03-03 DIAGNOSIS — R600 Localized edema: Secondary | ICD-10-CM | POA: Insufficient documentation

## 2017-03-03 DIAGNOSIS — J320 Chronic maxillary sinusitis: Secondary | ICD-10-CM | POA: Insufficient documentation

## 2017-03-09 ENCOUNTER — Ambulatory Visit (HOSPITAL_COMMUNITY): Payer: Medicaid Other | Admitting: Oncology

## 2017-03-12 ENCOUNTER — Other Ambulatory Visit: Payer: Self-pay

## 2017-03-12 ENCOUNTER — Encounter (HOSPITAL_COMMUNITY): Payer: Self-pay | Admitting: Oncology

## 2017-03-12 ENCOUNTER — Encounter (HOSPITAL_COMMUNITY): Payer: Medicaid Other | Attending: Oncology | Admitting: Oncology

## 2017-03-12 ENCOUNTER — Ambulatory Visit (HOSPITAL_COMMUNITY): Payer: Medicaid Other | Admitting: Oncology

## 2017-03-12 VITALS — BP 159/106 | HR 91 | Temp 97.6°F | Resp 18 | Wt 144.5 lb

## 2017-03-12 DIAGNOSIS — D582 Other hemoglobinopathies: Secondary | ICD-10-CM | POA: Diagnosis not present

## 2017-03-12 DIAGNOSIS — D751 Secondary polycythemia: Secondary | ICD-10-CM

## 2017-03-12 DIAGNOSIS — Z79899 Other long term (current) drug therapy: Secondary | ICD-10-CM | POA: Insufficient documentation

## 2017-03-12 DIAGNOSIS — F17210 Nicotine dependence, cigarettes, uncomplicated: Secondary | ICD-10-CM | POA: Insufficient documentation

## 2017-03-12 DIAGNOSIS — R718 Other abnormality of red blood cells: Secondary | ICD-10-CM | POA: Insufficient documentation

## 2017-03-12 DIAGNOSIS — Z7982 Long term (current) use of aspirin: Secondary | ICD-10-CM | POA: Insufficient documentation

## 2017-03-12 DIAGNOSIS — Z23 Encounter for immunization: Secondary | ICD-10-CM | POA: Diagnosis not present

## 2017-03-12 MED ORDER — PNEUMOCOCCAL 13-VAL CONJ VACC IM SUSP
0.5000 mL | Freq: Once | INTRAMUSCULAR | Status: AC
  Administered 2017-03-12: 0.5 mL via INTRAMUSCULAR

## 2017-03-12 MED ORDER — PNEUMOCOCCAL 13-VAL CONJ VACC IM SUSP
INTRAMUSCULAR | Status: AC
  Filled 2017-03-12: qty 0.5

## 2017-03-12 MED ORDER — INFLUENZA VAC SPLIT QUAD 0.5 ML IM SUSY
PREFILLED_SYRINGE | INTRAMUSCULAR | Status: AC
  Filled 2017-03-12: qty 0.5

## 2017-03-12 MED ORDER — INFLUENZA VAC SPLIT QUAD 0.5 ML IM SUSY
0.5000 mL | PREFILLED_SYRINGE | Freq: Once | INTRAMUSCULAR | Status: AC
  Administered 2017-03-12: 0.5 mL via INTRAMUSCULAR

## 2017-03-12 NOTE — Progress Notes (Signed)
[facility] Cancer Initial Visit:  Patient Care Team: Kari BaarsHawkins, Edward, MD as PCP - General Lincoln Brighamurham, Christopher, DDS as Consulting Physician (Oral Surgery) Vickki HearingHarrison, Stanley E, MD as Consulting Physician (Orthopedic Surgery)  CHIEF COMPLAINTS/PURPOSE OF CONSULTATION:  Elevated hematocrit  HISTORY OF PRESENTING ILLNESS: Brady Bell 61 y.o. male presents today for evaluation of elevated hematocrit.  He was referred by his neurologist Dr.Yan who he sees for his chronic migraines.  He had a CBC performed on 12/08/2016 which demonstrated WBC 6.5K, hemoglobin 20.2 g/dL, hematocrit 16.1%58.3%, MCV 101, platelet count 168K.  Last CBC from 01/05/2015 demonstrated hemoglobin 16.9 g/dL, hematocrit 09.6%49.5%.  Patient is a chronic smoker has smoked on and off for 40 years, and he currently smokes half a pack per day.  He denies any testosterone replacement, or sleep apnea.  He still has neuropathic pain from shingles which he had about 6 months ago.  He states his migraines are better than before and he gets them about a few times a month now.  He denies any chest pain, shortness of breath, abdominal pain, fatigue, focal weakness, weight loss, night sweats. 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 Patient complains of off-and-on headache had a recent CT scan of the sinuses done which reveals pansinusitis.  ENT consultation is being scheduled Review of Systems - Oncology ROS as per HPI otherwise 12 point ROS is negative. Chronic headache.  Had a CT scan of the sinuses. ENT evaluation pending.  Past history of migraine.  Continues to smoke MEDICAL HISTORY: Past Medical History:  Diagnosis Date  . Chronic ankle pain   . Chronic knee pain   . Hypoglycemia   . Migraines   . Peripheral vascular disease (HCC)   . Seizures (HCC)   . Shingles     SURGICAL HISTORY: Past Surgical History:  Procedure Laterality Date  . FASCIOTOMY   09/06/2011   Procedure: FASCIOTOMY;  Surgeon: Fransisco HertzBrian L Chen, MD;  [facility];  Service: Vascular;  Laterality: Left;  . FEMORAL-TIBIAL BYPASS GRAFT  09/06/2011   Procedure: BYPASS GRAFT FEMORAL-TIBIAL ARTERY;  Surgeon: Fransisco HertzBrian L Chen, MD;  [facility];  Service: Vascular;  Laterality: Left;  . LOWER EXTREMITY ANGIOGRAM N/A 09/05/2011   Procedure: LOWER EXTREMITY ANGIOGRAM;  Surgeon: Sherren Kernsharles E Fields, MD;  [facility];  Service: Cardiovascular;  Laterality: N/A;  . LOWER EXTREMITY ANGIOGRAM Left 04/15/2012   Procedure: LOWER EXTREMITY ANGIOGRAM;  Surgeon: Fransisco HertzBrian L Chen, MD;  [facility];  Service: Cardiovascular;  Laterality: Left;  . PR VEIN BYPASS GRAFT,AORTO-FEM-POP  09/06/11   Left   . TOOTH EXTRACTION  June-July-Aug. 2015   Several     SOCIAL HISTORY: Social History   Socioeconomic History  . Marital status: Single    Spouse name: Not on file  . Number of children: 0  . Years of education: some college  . Highest education level: Not on file  Social Needs  . Financial resource strain: Not on file  . Food insecurity - worry: Not on file  . Food insecurity - inability: Not on file  . Transportation needs - medical: Not on file  . Transportation needs - non-medical: Not on file  Occupational History  . Occupation: Disabled  Tobacco Use  . Smoking status: Current Every Day Smoker    Packs/day: 0.50    Years: 30.00    Pack years: 15.00    Types: Cigarettes  . Smokeless tobacco: Never  Used  . Tobacco comment: pt states that he is trying his best to quit  Substance and Sexual Activity  . Alcohol use: Yes    Alcohol/week: 0.0 oz    Comment: Few drinks per day  . Drug use: Yes    Types: Marijuana    Comment: occasional use  . Sexual activity: Yes  Other Topics Concern  . Not on file  Social History Narrative   Lives alone.   Left-handed.   2-3 cups caffeine per day.    FAMILY HISTORY Family History  Problem Relation Age of Onset  . Alcohol abuse  Father   . Kidney failure Father   . Other Mother        "age"  . Diabetes Unknown     ALLERGIES:  has No Known Allergies.  MEDICATIONS:  Current Outpatient Medications  Medication Sig Dispense Refill  . ALPRAZolam (XANAX) 1 MG tablet Take 1-2 tablets thirty minutes prior to MRI.  May take one additional tablet before entering scanner, if needed.  MUST HAVE DRIVER. 3 tablet 0  . aspirin EC 81 MG tablet Take 81 mg by mouth daily.    . divalproex (DEPAKOTE ER) 500 MG 24 hr tablet Take 1 tablet (500 mg total) at bedtime by mouth. 90 tablet 4  . eletriptan (RELPAX) 40 MG tablet Take 40 mg by mouth as needed for migraine or headache. May repeat in 2 hours if headache persists or recurs.    . gabapentin (NEURONTIN) 300 MG capsule Take 300 mg by mouth 3 (three) times daily.    . ondansetron (ZOFRAN ODT) 4 MG disintegrating tablet Take 1 tablet (4 mg total) by mouth every 8 (eight) hours as needed. 20 tablet 6  . SUMAtriptan (IMITREX STATDOSE SYSTEM) 6 MG/0.5ML SOAJ Inject 6 mg into the skin as needed. 12 Cartridge 11  . SUMAtriptan (IMITREX) 100 MG tablet Take 1 tablet (100 mg total) by mouth every 2 (two) hours as needed for migraine. May repeat in 2 hours if headache persists or recurs. 12 tablet 11   No current facility-administered medications for this visit.     PHYSICAL EXAMINATION:  ECOG PERFORMANCE STATUS: 0 - Asymptomatic  RN vitals reviewed.  Physical Exam Constitutional: Well-developed, well-nourished, and in no distress.   HENT:  Head: Normocephalic and atraumatic.  Mouth/Throat: No oropharyngeal exudate. Mucosa moist. Eyes: Pupils are equal, round, and reactive to light. Conjunctivae are normal. No scleral icterus.  Neck: Normal range of motion. Neck supple. No JVD present.  Cardiovascular: Normal rate, regular rhythm and normal heart sounds.  Exam reveals no gallop and no friction rub.   No murmur heard. Pulmonary/Chest: Effort normal and breath sounds normal. No  respiratory distress. No wheezes.No rales.  Abdominal: Soft. Bowel sounds are normal. No distension. There is no tenderness. There is no guarding.  Musculoskeletal: No edema or tenderness.  Lymphadenopathy:    No cervical or supraclavicular adenopathy.  Neurological: Alert and oriented to person, place, and time. No cranial nerve deficit.  Skin: Skin is warm and dry. No rash noted. No erythema. No pallor.  Psychiatric: Affect and judgment normal.    LABORATORY DATA: I have personally reviewed the data as listed:  No visits with results within 1 Month(s) from this visit.  Latest known visit with results is:  Office Visit on 02/09/2017  Component Date Value Ref Range Status  . JAK2 GenotypR 02/09/2017 Comment   Final   Comment: (NOTE) Result: NEGATIVE for the JAK2 V617F mutation. Interpretation:  The G  to T nucleotide change encoding the V617F mutation was not detected.  This result does not rule out the presence of the JAK2 mutation at a level below the sensitivity of detection of this assay, or the presence of other mutations within JAK2 not detected by this assay.  This result does not rule out a diagnosis of polycythemia vera, essential thrombocythemia or idiopathic myelofibrosis as the V617F mutation is not detected in all patients with these disorders.   Marland Kitchen BACKGROUND: 02/09/2017 Comment   Final   Comment: (NOTE) JAK2 is a cytoplasmic tyrosine kinase with a key role in signal transduction from multiple hematopoietic growth factor receptors. A point mutation within exon 14 of the JAK2 gene ([id number]) encoding a valine to phenylalanine substitution at position 617 of the JAK2 protein (V617F) has been identified in most patients with polycythemia vera, and in about half of those with either essential thrombocythemia or idiopathic myelofibrosis. The V617F has also been detected, although infrequently, in other myeloid disorders such as chronic myelomonocytic leukemia and chronic  neutrophilic luekemia. V617F is an acquired mutation that alters a highly conserved valine present in the negative regulatory JH2 domain of the JAK2 protein and is predicted to dysregulate kinase activity. Methodology: Total genomic DNA was extracted and subjected to TaqMan real-time PCR amplification/detection. Two amplification products per sample were monitored by real-time PCR using primers/probes s                          pecific to JAK2 wild type (WT) and JAK2 mutant V617F. The ABI7900 Absolute Quantitation software will compare the patient specimen valuse to the standard curves and generate percent values for wild type and mutant type. In vitro studies have indicated that this assay has an analytical sensitivity of 1%. References: Baxter EJ, Scott Lenoria Farrier, et al. Acquired mutation of the tyrosine kinase JAK2 in human myeloproliferative disorders. Lancet. 2005 Mar 19-25; 365(9464):1054-1061. Milus Banister Couedic JP. A unique clonal JAK2 mutation leading to constitutive signaling causes polycythaemia vera. Nature. 2005 Apr 28; 434(7037):1144-1148. Kralovics R, Passamonti F, Buser AS, et al. A gain-of-function mutation of JAK2 in myeloproliferative disorders. N Engl J Med. 2005 Apr 28; 352(17):1779-1790.   . Director Review, JAK2 02/09/2017 Comment   Final   Comment: (NOTE) Les Pou, PhD, [facility]               Director, Molecular [facility] for Molecular               Biology and Pathology               Research [place], [place]               [phone] This test was developed and its performance characteristics determined by [facility]. It has not been cleared or approved by the Food and Drug Administration.   Marland Kitchen REFLEX: 02/09/2017 Comment   Final   Comment: (NOTE) Reflex to CALR Mutation Analysis, JAK2 Exon 12-15 Mutation Analysis, and MPL Mutation Analysis is indicated.   Marland Kitchen Extraction 02/09/2017 Completed   Corrected   Comment:  (NOTE) Performed At: [facility] [address] [place], [place] [id number] Oley Balm MD [id number] Performed At: [facility] [address] [place], [place] [id number] Oley Balm MD [id number]   . CALR Mutation Detection Result 02/09/2017 Comment   Final  Comment: (NOTE) NEGATIVE No insertions or deletions were detected within the analyzed region of the calreticulin (CALR) gene. A negative result does not entirely exclude the possibility of a clonal population carrying CALR gene mutations that are not covered by this assay. Results should be interpreted in conjunction with clinical and laboratory findings for the most accurate interpretation.   . Background: 02/09/2017 Comment   Final   Comment: (NOTE) The calcium-binding endoplasmic reticulin chaperone protein, calreticulin (CALR), is somatically mutated in approximately 70% of patients with JAK2-negative essential thrombocythemia (ET) and 60- 88% of patients with JAK2-negative primary myelofibrosis(PMF). Only a minority of patients (approximately 8%) with myelodysplasia have mutations in  CALR gene. CALR mutations are rarely detected in patients with de novo acute myeloid leukemia, chronic myelogenous leukemia, lymphoid leukemia, or solid tumors. CALR mutations are not detected in polycythemia and generally appear to be mutually exclusive with JAK2 mutations and MPL mutations. The majority of mutational changes involve a variety of insertion or deletion mutations in exon 9 of the calreticulin gene: approximately 53% of all CALR mutations are a 52 bp deletion (type-1) while the second most prevalent mutation (approximately 32%) contains a 5 bp insertion (type-2). Other mutations (non-type 1 or type 2) are seen                           in a small minority of cases. CALR mutations in PMF tend to be associated with a favorable prognosis compared to JAK2 V617F mutations, whereas primary  myelofibrosis negative for CALR, JAK2 V617F and MPL mutations (so-called triple negative) is associated with a poor prognosis and shorter survival. The detection of a CALR gene mutation aids in the specific diagnosis of a myeloproliferative neoplasm, and help distinguish this clonal disease from a benign reactive process.   . Methodology: 02/09/2017 Comment   Final   Comment: (NOTE) Genomic DNA was isolated from the provided specimen. Polymerase chain reaction (PCR) of exon 9 of the CALR gene was performed with specific fluorescent-labeled primers, and the PCR product was analyzed by capillary gel electrophoresis to determine the size of the PCR products. This PCR assay is capable of detecting a mutant cell population with a sensitivity of 5 mutant cells per 100 normal cells. A negative result does not exclude the presence of a myeloproliferative disorder or other neoplastic process. This test was developed and its performance characteristics determined by [facility]. It has not been cleared or approved by the Food and Drug Administration. The FDA has determined that such clearance or approval is not necessary.   . References: 02/09/2017 Comment   Final   Comment: (NOTE) 1. Klampfel, T. et al. (2013) Somatic mutations of calreticulin in   myeloproliferative neoplasms. New Engl. J. Med. 161:0960-4540. 2. Gerre Couch et al. (2013) Somatic CALR mutations in   myeloproliferative neoplasms with nonmutated JAK2. New Engl. J.   Med. [phone].   Marland Kitchen Director Review 02/09/2017 Comment   Final   Comment: (NOTE) Les Pou, PhD, [facility]               Director, Molecular [facility] for Molecular               Biology and Pathology               Research [place], [place]  [phone]   . JAK2 Exons 12-15 Mut Det PCR: 02/09/2017 Comment   Final   Comment: (NOTE) NEGATIVE JAK2 mutations were not detected in exons 12, 13, 14 and 15. This result does  not rule out the presence of JAK2 mutation at a level below the detection sensitivity of this assay, the presence of other mutations outside the analyzed region of the JAK2 gene, or the presence of a myeloproliferative or other neoplasm. Result must be correlated with other clinical data for the most accurate diagnosis.   . Indications 02/09/2017 Comment:   Final   NO INDICATION SPECIFIED  . Specimen Type 02/09/2017 Comment   Final   No specimen type provided.  Marland Kitchen. BACKGROUND: 02/09/2017 Comment   Final   Comment: (NOTE) JAK2 V617F mutation is detected in patients with polycythemia vera (95%), essential thrombocythemia (50%) and primary myelofibrosis (50%). A small percentage of JAK2 mutation positive patients (3.3%) contain other non-V617F mutations within exons 12 to 15. The detection of a JAK2 gene mutation aids in the specific diagnosis of a myeloproliferative neoplasm, and help distinguish this clonal disease from a benign reactive process.   . Method 02/09/2017 Comment   Final   Comment: (NOTE) Total RNA was purified from the provided specimen. The JAK2 gene region covering exons 12 to 15 was subjected to reverse- transcription coupled PCR amplification, and bi-directional sequencing to identify sequence variations. This assay has a sensitivity to detect approximately 15% population of cells containing the JAK2 mutations in a background of non-mutant cells. This test was developed and its performance characteristics determined by [facility]. It has not been cleared or approved by the Food and Drug Administration.   . References 02/09/2017 Comment   Final   Comment: (NOTE) Algasham, N. et al. Detection of mutations in JAK2 exons 12-15 by Sanger sequencing. Int J Lab Hemato. 2015, 38:34-41. Garlon HatchetMa W. et al. Mutation profile of JAK2 transcripts in patients with chronic myeloproliferative neoplasias. J Mol Diagn. 2009, 11:49-53.   Marland Kitchen. DIRECTOR REVIEW: 02/09/2017 Comment   Final    Comment: (NOTE) Mellody Lifean Wang, PhD  Director, Molecular Oncology  [facility] and Pathology  [place], [place] [id number]  [phone]   . MPL MUTATION ANALYSIS RESULT: 02/09/2017 Comment   Final   Comment: (NOTE) No MPL mutation was identified in the provided specimen of this individual. Results should be interpreted in conjunction with clinical and other laboratory findings for the most accurate interpretation.   Marland Kitchen. BACKGROUND: 02/09/2017 Comment   Final   Comment: (NOTE) MPL (myeloproliferative leukemia virus oncogene homology) belongs to the hematopoietin superfamily and enables its ligand thrombopoietin to facilitate both global hematopoiesis and megakaryocyte growth and differentiation. MPL W515 mutations are present in patients with primary myelofibrosis (PMF) and essential thrombocythemia (ET) at a frequency of approximately 5% and 1% respectively. The S505 mutation is detected in patients with hereditary thrombocythemia.   Marland Kitchen. METHODOLOGY: 02/09/2017 Comment   Final   Comment: (NOTE) Genomic DNA was purified from the provided specimen. MPL gene region covering the S505N and W515L/K mutations were subjected to PCR amplification and bi-directional sequencing in duplicate to identify sequence variations. This assay has a sensitivity to detect approximately 20-25% population of cells containing the MPL mutations in a background of non-mutant cells. This assay will not detect the mutation below the sensitivity of this assay. Molecular- based testing is highly accurate, but as in any laboratory test, rare diagnostic errors may occur.   Marland Kitchen. REFERENCES: 02/09/2017 Comment   Final  Comment: (NOTE) 1. Pardanani AD, et al. (2006). MPL515 mutations in   myeloproliferative and other myeloid disorders: a study   of 1182 patients. Blood 161:0960-4540. 2. Gavin Pound and Levine RL. (2008). JAK2 and MPL   mutations in myeloproliferative neoplasms: discovery  and   science. Leukemia 22:1813-1817. 3. Toribio Harbour, et al. (2009). Evidence for a founder effect   of the MPL-S505N mutation in eight [place] pedigrees with   hereditary thrombocythemia. Haematologica 94(10):1368-   1374.   Marland Kitchen DIRECTOR REVIEW: 02/09/2017 Comment   Final   Comment: (NOTE) Mellody Life, PhD  Director, Molecular Oncology  [facility] for Molecular Biology and Pathology  Research [place], [place] [address]  [phone] This test was developed and its performance characteristics determined by [facility]. It has not been cleared or approved by the Food and Drug Administration.   . Extraction 02/09/2017 Comment   Final   Comment: (NOTE) This sample has been received and DNA extraction has been performed. Performed At: [facility] [address] [place], [place] [id number] Oley Balm MD [id number] Performed At: [facility] [place], [place] [id number] Oley Balm MD [id number]     RADIOGRAPHIC STUDIES: I have personally reviewed the radiological images as listed and agree with the findings in the report  No results found.  ASSESSMENT/PLAN 61 year old male who is a chronic smoker presents for evaluation of elevated hemoglobin/hematocrit.  Patient had an negative Jak 2 mutation and MPL is also negative.   CT scan of the maxillary sinusitis. He has chronic headache which is increased in intensity.  Headache might be secondary to elevated hematocrit.  Usually phlebotomy is not indicated unless patient is symptomatic and headache might be secondary to high hematocrit so will try one therapeutic phlebotomy and if there is improvement in the symptom will try to keep hematocrit below 55. The patient was advised to stop smoking and all the available resources had been offered. Patient was also advised regarding CT screening for pulmonary nodule for early detection of the lung cancer.  Patient is going to think about it and let us  know during the second visit or before Return appointment in 6 months with CBC or before if needed Therapeutic phlebotomy for trial to see whether there is any improvement in symptoms. Elevated blood pressure And were advised to get checked with primary care physician  No orders of the defined types were placed in this encounter.   All questions were answered. The patient knows to call the clinic with any problems, questions or concerns.  This note was electronically signed.    Johney Maine, MD  03/12/2017 10:52 AM

## 2017-03-12 NOTE — Patient Instructions (Signed)
[facility] Discharge Instructions  RECOMMENDATIONS MADE BY THE CONSULTANT AND ANY TEST RESULTS WILL BE SENT TO YOUR REFERRING PHYSICIAN.  You were seen today by Dr. Johney MaineJanak Choksi You got your flu shot and your prevnar 13 (pneumonia) shot today Please let your other providers that you received them today Follow up as scheduled.  Thank you for choosing [facility] to provide your oncology and hematology care.  To afford each patient quality time with our provider, please arrive at least 15 minutes before your scheduled appointment time.    If you have a lab appointment with the Cancer Center please come in thru the  Main Entrance and check in at the main information desk  You need to re-schedule your appointment should you arrive 10 or more minutes late.  We strive to give you quality time with our providers, and arriving late affects you and other patients whose appointments are after yours.  Also, if you no show three or more times for appointments you may be dismissed from the clinic at the providers discretion.     Again, thank you for choosing [facility].  Our hope is that these requests will decrease the amount of time that you wait before being seen by our physicians.       _____________________________________________________________  Should you have questions after your visit to [facility], please contact our office at [phone] between the hours of 8:30 a.m. and 4:30 p.m.  Voicemails left after 4:30 p.m. will not be returned until the following business day.  For prescription refill requests, have your pharmacy contact our office.       Resources For Cancer Patients and their Caregivers ? American Cancer Society: Can assist with transportation, wigs, general needs, runs Look Good Feel Better.        [phone] ? Cancer Care: Provides financial assistance, online support  groups, medication/co-pay assistance.  1-800-813-HOPE (231) 471-4556(4673) ? [facility] Assists [place] cancer patients and their families through emotional , educational and financial support.  479-562-62699730449881 ? [place] DSS Where to apply for food stamps, Medicaid and utility assistance. 289-372-3068717-865-8540 ? [facility]: Transportation to medical appointments. ([phone] [phone] ? Social Security Administration: May apply for disability if have a Stage IV cancer. 873-663-2663443-439-9890 [phone] ? [place] [employer], Disability and Transit Services: Assists with nutrition, care and transit needs. 858-012-92109096422912  Cancer Center Support Programs: @10RELATIVEDAYS @ > Cancer Support Group  2nd Tuesday of the month 1pm-2pm, Journey Room  > Creative Journey  3rd Tuesday of the month 1130am-1pm, Journey Room  > Look Good Feel Better  1st Wednesday of the month 10am-12 noon, Journey Room (Call American Cancer Society to register [phone])

## 2017-03-16 ENCOUNTER — Ambulatory Visit (INDEPENDENT_AMBULATORY_CARE_PROVIDER_SITE_OTHER): Payer: Medicaid Other | Admitting: Otolaryngology

## 2017-03-16 DIAGNOSIS — J321 Chronic frontal sinusitis: Secondary | ICD-10-CM | POA: Diagnosis not present

## 2017-03-16 DIAGNOSIS — J322 Chronic ethmoidal sinusitis: Secondary | ICD-10-CM

## 2017-03-16 DIAGNOSIS — J320 Chronic maxillary sinusitis: Secondary | ICD-10-CM | POA: Diagnosis not present

## 2017-03-16 DIAGNOSIS — J323 Chronic sphenoidal sinusitis: Secondary | ICD-10-CM

## 2017-03-27 ENCOUNTER — Other Ambulatory Visit: Payer: Self-pay | Admitting: Otolaryngology

## 2017-04-02 ENCOUNTER — Encounter (HOSPITAL_COMMUNITY)
Admission: RE | Admit: 2017-04-02 | Discharge: 2017-04-02 | Disposition: A | Discharge status: Home / Self Care | Payer: Medicaid Other | Source: Ambulatory Visit | Attending: Oncology | Admitting: Oncology

## 2017-04-02 ENCOUNTER — Other Ambulatory Visit (HOSPITAL_COMMUNITY): Payer: Self-pay | Admitting: *Deleted

## 2017-04-02 DIAGNOSIS — D751 Secondary polycythemia: Secondary | ICD-10-CM | POA: Diagnosis not present

## 2017-04-02 LAB — CBC WITH DIFFERENTIAL/PLATELET
Basophils Absolute: 0.1 10*3/uL (ref 0.0–0.1)
Basophils Relative: 1 %
EOS PCT: 4 %
Eosinophils Absolute: 0.3 10*3/uL (ref 0.0–0.7)
HCT: 46.6 % (ref 39.0–52.0)
HEMOGLOBIN: 15.6 g/dL (ref 13.0–17.0)
LYMPHS ABS: 0.9 10*3/uL (ref 0.7–4.0)
LYMPHS PCT: 13 %
MCH: 33.8 pg (ref 26.0–34.0)
MCHC: 33.5 g/dL (ref 30.0–36.0)
MCV: 101.1 fL — ABNORMAL HIGH (ref 78.0–100.0)
Monocytes Absolute: 1.3 10*3/uL — ABNORMAL HIGH (ref 0.1–1.0)
Monocytes Relative: 18 %
Neutro Abs: 4.7 10*3/uL (ref 1.7–7.7)
Neutrophils Relative %: 64 %
PLATELETS: 238 10*3/uL (ref 150–400)
RBC: 4.61 MIL/uL (ref 4.22–5.81)
RDW: 14.3 % (ref 11.5–15.5)
WBC: 7.3 10*3/uL (ref 4.0–10.5)

## 2017-04-02 NOTE — Progress Notes (Signed)
Brady Bell presents today for phlebotomy per MD orders. HGB/HCT:15.6/46.6 Phlebotomy procedure started at 1346 and ended at1359. 300cc removed. Patient tolerated procedure well. IV needle removed intact from R AC.

## 2017-04-06 ENCOUNTER — Encounter (HOSPITAL_BASED_OUTPATIENT_CLINIC_OR_DEPARTMENT_OTHER): Payer: Self-pay | Admitting: *Deleted

## 2017-04-06 ENCOUNTER — Other Ambulatory Visit: Payer: Self-pay

## 2017-04-09 ENCOUNTER — Encounter (HOSPITAL_BASED_OUTPATIENT_CLINIC_OR_DEPARTMENT_OTHER): Payer: Self-pay | Admitting: *Deleted

## 2017-04-10 ENCOUNTER — Ambulatory Visit (HOSPITAL_COMMUNITY)
Admission: RE | Admit: 2017-04-10 | Discharge: 2017-04-10 | Disposition: A | Discharge status: Home / Self Care | Payer: Medicaid Other | Source: Ambulatory Visit | Attending: Vascular Surgery | Admitting: Vascular Surgery

## 2017-04-10 ENCOUNTER — Encounter: Payer: Self-pay | Admitting: Family

## 2017-04-10 ENCOUNTER — Encounter (HOSPITAL_COMMUNITY): Payer: Medicaid Other

## 2017-04-10 ENCOUNTER — Encounter: Payer: Self-pay | Admitting: *Deleted

## 2017-04-10 ENCOUNTER — Ambulatory Visit (INDEPENDENT_AMBULATORY_CARE_PROVIDER_SITE_OTHER): Payer: Medicaid Other | Admitting: Family

## 2017-04-10 ENCOUNTER — Other Ambulatory Visit: Payer: Self-pay

## 2017-04-10 VITALS — BP 139/86 | HR 86 | Temp 98.2°F | Resp 16 | Ht 70.0 in | Wt 145.0 lb

## 2017-04-10 DIAGNOSIS — I70202 Unspecified atherosclerosis of native arteries of extremities, left leg: Secondary | ICD-10-CM | POA: Insufficient documentation

## 2017-04-10 DIAGNOSIS — F17200 Nicotine dependence, unspecified, uncomplicated: Secondary | ICD-10-CM | POA: Insufficient documentation

## 2017-04-10 DIAGNOSIS — Z95828 Presence of other vascular implants and grafts: Secondary | ICD-10-CM | POA: Diagnosis present

## 2017-04-10 DIAGNOSIS — I779 Disorder of arteries and arterioles, unspecified: Secondary | ICD-10-CM | POA: Diagnosis not present

## 2017-04-10 NOTE — H&P (View-Only) (Signed)
[facility] [place]   CC: Follow up peripheral artery occlusive disease  History of Present Illness Brady Bell is a 62 y.o. male  patient of Dr. Imogene Burn who is s/p left common femoral artery to anterior tibial artery bypass with non-reversed ipslateral greater saphenous vein (Date: 09/06/11).  He returns today for follow up, after ABI's and left LE arterial duplex were performed on 03-21-16.  He had been homeless for about 2 years, but is now living in an apt.   He now has disability status.  He denies claudication symptoms, denies non healing wounds, his left knee has a medial meniscus tear and he states that he needs surgical repair.  He had a left knee arthroscopic procedure in February 2016, states subsequently he needs another left knee arthroscopic procedure. Pt reports considerable discomfort in his left knee, wears a brace.  He has polycythemia, had therapeutic phlebotomy on 04-02-17 and states his headaches are less severe.  He is scheduled for bilateral maxillary antrostomy on 05-11-17 for chronic sinusitis.   He denies history of stroke or TIA.  Pt Diabetic: No Pt smoker: smoker (1/2 ppd x since age 26 with several periods of abstinence)  Pt meds include:  Statin :No, states his cholesterol is good ASA: Yes Other anticoagulants/antiplatelets: no      Past Medical History:  Diagnosis Date  . Chronic ankle pain   . Chronic knee pain   . Hypoglycemia   . Migraines   . Peripheral vascular disease (HCC)   . Recurrent sinus infections   . Seizures (HCC)   . Shingles     Social History Social History   Tobacco Use  . Smoking status: Current Every Day Smoker    Packs/day: 0.50    Years: 30.00    Pack years: 15.00    Types: Cigarettes  . Smokeless tobacco: Never Used  . Tobacco comment: pt states that he is trying his best to quit  Substance Use Topics  . Alcohol use: Yes    Alcohol/week: 0.0 oz    Comment: Few drinks per  day  . Drug use: Yes    Types: Marijuana    Comment: last smoked marijuana 03-31-17    Family History Family History  Problem Relation Age of Onset  . Alcohol abuse Father   . Kidney failure Father   . Other Mother        "age"  . Diabetes Unknown     Past Surgical History:  Procedure Laterality Date  . FASCIOTOMY  09/06/2011   Procedure: FASCIOTOMY;  Surgeon: Fransisco Hertz, MD;  Location: [facility] OR;  Service: Vascular;  Laterality: Left;  . FEMORAL-TIBIAL BYPASS GRAFT  09/06/2011   Procedure: BYPASS GRAFT FEMORAL-TIBIAL ARTERY;  Surgeon: Fransisco Hertz, MD;  Location: [facility] OR;  Service: Vascular;  Laterality: Left;  . KNEE CARTILAGE SURGERY Left   . LOWER EXTREMITY ANGIOGRAM N/A 09/05/2011   Procedure: LOWER EXTREMITY ANGIOGRAM;  Surgeon: Sherren Kerns, MD;  Location: [facility] CATH LAB;  Service: Cardiovascular;  Laterality: N/A;  . LOWER EXTREMITY ANGIOGRAM Left 04/15/2012   Procedure: LOWER EXTREMITY ANGIOGRAM;  Surgeon: Fransisco Hertz, MD;  Location: [facility] CATH LAB;  Service: Cardiovascular;  Laterality: Left;  . PR VEIN BYPASS GRAFT,AORTO-FEM-POP  09/06/11   Left   . TOOTH EXTRACTION  June-July-Aug. 2015   Several     No Known Allergies  Current Outpatient Medications  Medication Sig Dispense Refill  . aspirin EC 81 MG tablet Take 81 mg  by mouth daily.    . divalproex (DEPAKOTE ER) 500 MG 24 hr tablet Take 1 tablet (500 mg total) at bedtime by mouth. 90 tablet 4  . eletriptan (RELPAX) 40 MG tablet Take 40 mg by mouth as needed for migraine or headache. May repeat in 2 hours if headache persists or recurs.    . gabapentin (NEURONTIN) 300 MG capsule Take 300 mg by mouth 3 (three) times daily.    . ondansetron (ZOFRAN ODT) 4 MG disintegrating tablet Take 1 tablet (4 mg total) by mouth every 8 (eight) hours as needed. 20 tablet 6  . SUMAtriptan (IMITREX STATDOSE SYSTEM) 6 MG/0.5ML SOAJ Inject 6 mg into the skin as needed. 12 Cartridge 11  . SUMAtriptan (IMITREX) 100 MG tablet Take 1 tablet (100 mg  total) by mouth every 2 (two) hours as needed for migraine. May repeat in 2 hours if headache persists or recurs. 12 tablet 11   No current facility-administered medications for this visit.     ROS: See HPI for pertinent positives and negatives.   Physical Examination  Vitals:   04/10/17 1539 04/10/17 1545 04/10/17 1550  BP: (!) 159/104 (!) 154/105 139/86  Pulse: 97 97 86  Resp: 16    Temp: 98.2 F (36.8 C)    TempSrc: Oral    SpO2: 96%    Weight: 145 lb (65.8 kg)    Height: 5\' 10"  (1.778 m)     Body mass index is 20.81 kg/m.  General: A&O x 3, WDWN. Thin male. Gait: normal  Eyes: PERRLA. Mild/moderate bilateral exophthalmos. Pulmonary: Respirations are non labored, CTAB but somewhat limited air movement, without wheezes, rales, or rhonchi.  Cardiac: regular rythm, no detected murmur.   Carotid Bruits Left Right   Negative  Negative    Abdominal aortic pulse is not palpable.  Radial pulses: are 2+ palpable and =   VASCULAR EXAM: Extremitieswithout ischemic changes  without Gangrene; without open wounds. No peripheral edema.   LE Pulses  LEFT  RIGHT   FEMORAL  2+palpable 2+palpable   POPLITEAL  graft palpable laterally  not palpable  POSTERIOR TIBIAL  not palpable  2+palpable   DORSALIS PEDIS ANTERIOR TIBIAL  2+palpable  Not palpable    Abdomen: soft, NT, no palpable masses.  Skin: no rashes, no ulcers.  Musculoskeletal: No muscle wasting or atrophy. No signs of ischemia in extremities. Brace on left knee.  Neurologic: A&O X 3; Appropriate Affect, MOTOR FUNCTION: moving all extremities equally, motor strength 5/5 throughout except 4/5 in left lower extremity. Speech is fluent/normal. CN 2-12 intact. Psychiatric: Mood appropriate for clinical situation.     ASSESSMENT: Brady Bell is a 62 y.o. male who is s/p left common femoral artery to anterior tibial artery bypass  with non-reversed ipslateral greater saphenous vein (Date: 09/06/11).  He has no claudication sx's with walking, no signs of ischemia in his feet/legs.His walking is limited by left knee pain torn meniscus. Left DP and right PT are 2+ palpable.   Fortunately he does not have DM, but unfortunately he continues to smoke.  Pt states ABI's not done today since medicaid would not cover.  DATA Left LE Arterial Duplex (04/10/17): Significant partially occlusive plaque in the left CFA with increased velocity of 300 cm/s (was 141 cm/s on 03-21-16).  Left leg bypass graft with no evidence of restenosis.  50-74% left CFA stenosis. Increased stenosis in the left  CFA compared to the exam on 03-21-16.    ABI's 03-21-16: Right: 1.19 (1.26  on 03-09-15), triphasic waveforms. Left: 1.16 (1.13), PT: monophasic, DP: triphasic    PLAN:  Daily seated leg exercises as taught to pt by physical therapy.  Walking daily as tolerated by his left knee injury and per his orthopedic surgeon.   The patient was counseled re smoking cessation and given several free resources re smoking cessation.  Based on the patient's vascular studies and examination, and after discussing with Dr. Imogene Burn, pt will be scheduled for aortogram with bilateral run off, possible intervention by Dr. Imogene Burn.  I advised him to notify us should he develop concerns re the circulation in his legs.   I discussed in depth with the patient the nature of atherosclerosis, and emphasized the importance of maximal medical management including strict control of blood pressure, blood glucose, and lipid levels, obtaining regular exercise, and cessation of smoking.  The patient is aware that without maximal medical management the underlying atherosclerotic disease process will progress, limiting the benefit of any interventions.  The patient was given information about PAD including signs, symptoms, treatment, what symptoms should prompt the patient to  seek immediate medical care, and risk reduction measures to take.  Charisse March, RN, MSN, FNP-C [facility] Phone: [phone]  Clinic MD: Imogene Burn  04/10/17 3:52 PM

## 2017-04-10 NOTE — H&P (View-Only) (Signed)
[facility] [place]   CC: Follow up peripheral artery occlusive disease  History of Present Illness Brady Bell is a 62 y.o. male  patient of Dr. Imogene Burn who is s/p left common femoral artery to anterior tibial artery bypass with non-reversed ipslateral greater saphenous vein (Date: 09/06/11).  He returns today for follow up, after ABI's and left LE arterial duplex were performed on 03-21-16.  He had been homeless for about 2 years, but is now living in an apt.   He now has disability status.  He denies claudication symptoms, denies non healing wounds, his left knee has a medial meniscus tear and he states that he needs surgical repair.  He had a left knee arthroscopic procedure in February 2016, states subsequently he needs another left knee arthroscopic procedure. Pt reports considerable discomfort in his left knee, wears a brace.  He has polycythemia, had therapeutic phlebotomy on 04-02-17 and states his headaches are less severe.  He is scheduled for bilateral maxillary antrostomy on 05-11-17 for chronic sinusitis.   He denies history of stroke or TIA.  Pt Diabetic: No Pt smoker: smoker (1/2 ppd x since age 26 with several periods of abstinence)  Pt meds include:  Statin :No, states his cholesterol is good ASA: Yes Other anticoagulants/antiplatelets: no      Past Medical History:  Diagnosis Date  . Chronic ankle pain   . Chronic knee pain   . Hypoglycemia   . Migraines   . Peripheral vascular disease (HCC)   . Recurrent sinus infections   . Seizures (HCC)   . Shingles     Social History Social History   Tobacco Use  . Smoking status: Current Every Day Smoker    Packs/day: 0.50    Years: 30.00    Pack years: 15.00    Types: Cigarettes  . Smokeless tobacco: Never Used  . Tobacco comment: pt states that he is trying his best to quit  Substance Use Topics  . Alcohol use: Yes    Alcohol/week: 0.0 oz    Comment: Few drinks per  day  . Drug use: Yes    Types: Marijuana    Comment: last smoked marijuana 03-31-17    Family History Family History  Problem Relation Age of Onset  . Alcohol abuse Father   . Kidney failure Father   . Other Mother        "age"  . Diabetes Unknown     Past Surgical History:  Procedure Laterality Date  . FASCIOTOMY  09/06/2011   Procedure: FASCIOTOMY;  Surgeon: Fransisco Hertz, MD;  Location: [facility] OR;  Service: Vascular;  Laterality: Left;  . FEMORAL-TIBIAL BYPASS GRAFT  09/06/2011   Procedure: BYPASS GRAFT FEMORAL-TIBIAL ARTERY;  Surgeon: Fransisco Hertz, MD;  Location: [facility] OR;  Service: Vascular;  Laterality: Left;  . KNEE CARTILAGE SURGERY Left   . LOWER EXTREMITY ANGIOGRAM N/A 09/05/2011   Procedure: LOWER EXTREMITY ANGIOGRAM;  Surgeon: Sherren Kerns, MD;  Location: [facility] CATH LAB;  Service: Cardiovascular;  Laterality: N/A;  . LOWER EXTREMITY ANGIOGRAM Left 04/15/2012   Procedure: LOWER EXTREMITY ANGIOGRAM;  Surgeon: Fransisco Hertz, MD;  Location: [facility] CATH LAB;  Service: Cardiovascular;  Laterality: Left;  . PR VEIN BYPASS GRAFT,AORTO-FEM-POP  09/06/11   Left   . TOOTH EXTRACTION  June-July-Aug. 2015   Several     No Known Allergies  Current Outpatient Medications  Medication Sig Dispense Refill  . aspirin EC 81 MG tablet Take 81 mg  by mouth daily.    . divalproex (DEPAKOTE ER) 500 MG 24 hr tablet Take 1 tablet (500 mg total) at bedtime by mouth. 90 tablet 4  . eletriptan (RELPAX) 40 MG tablet Take 40 mg by mouth as needed for migraine or headache. May repeat in 2 hours if headache persists or recurs.    . gabapentin (NEURONTIN) 300 MG capsule Take 300 mg by mouth 3 (three) times daily.    . ondansetron (ZOFRAN ODT) 4 MG disintegrating tablet Take 1 tablet (4 mg total) by mouth every 8 (eight) hours as needed. 20 tablet 6  . SUMAtriptan (IMITREX STATDOSE SYSTEM) 6 MG/0.5ML SOAJ Inject 6 mg into the skin as needed. 12 Cartridge 11  . SUMAtriptan (IMITREX) 100 MG tablet Take 1 tablet (100 mg  total) by mouth every 2 (two) hours as needed for migraine. May repeat in 2 hours if headache persists or recurs. 12 tablet 11   No current facility-administered medications for this visit.     ROS: See HPI for pertinent positives and negatives.   Physical Examination  Vitals:   04/10/17 1539 04/10/17 1545 04/10/17 1550  BP: (!) 159/104 (!) 154/105 139/86  Pulse: 97 97 86  Resp: 16    Temp: 98.2 F (36.8 C)    TempSrc: Oral    SpO2: 96%    Weight: 145 lb (65.8 kg)    Height: 5\' 10"  (1.778 m)     Body mass index is 20.81 kg/m.  General: A&O x 3, WDWN. Thin male. Gait: normal  Eyes: PERRLA. Mild/moderate bilateral exophthalmos. Pulmonary: Respirations are non labored, CTAB but somewhat limited air movement, without wheezes, rales, or rhonchi.  Cardiac: regular rythm, no detected murmur.   Carotid Bruits Left Right   Negative  Negative    Abdominal aortic pulse is not palpable.  Radial pulses: are 2+ palpable and =   VASCULAR EXAM: Extremitieswithout ischemic changes  without Gangrene; without open wounds. No peripheral edema.   LE Pulses  LEFT  RIGHT   FEMORAL  2+palpable 2+palpable   POPLITEAL  graft palpable laterally  not palpable  POSTERIOR TIBIAL  not palpable  2+palpable   DORSALIS PEDIS ANTERIOR TIBIAL  2+palpable  Not palpable    Abdomen: soft, NT, no palpable masses.  Skin: no rashes, no ulcers.  Musculoskeletal: No muscle wasting or atrophy. No signs of ischemia in extremities. Brace on left knee.  Neurologic: A&O X 3; Appropriate Affect, MOTOR FUNCTION: moving all extremities equally, motor strength 5/5 throughout except 4/5 in left lower extremity. Speech is fluent/normal. CN 2-12 intact. Psychiatric: Mood appropriate for clinical situation.     ASSESSMENT: Brady Bell is a 62 y.o. male who is s/p left common femoral artery to anterior tibial artery bypass  with non-reversed ipslateral greater saphenous vein (Date: 09/06/11).  He has no claudication sx's with walking, no signs of ischemia in his feet/legs.His walking is limited by left knee pain torn meniscus. Left DP and right PT are 2+ palpable.   Fortunately he does not have DM, but unfortunately he continues to smoke.  Pt states ABI's not done today since medicaid would not cover.  DATA Left LE Arterial Duplex (04/10/17): Significant partially occlusive plaque in the left CFA with increased velocity of 300 cm/s (was 141 cm/s on 03-21-16).  Left leg bypass graft with no evidence of restenosis.  50-74% left CFA stenosis. Increased stenosis in the left  CFA compared to the exam on 03-21-16.    ABI's 03-21-16: Right: 1.19 (1.26  on 03-09-15), triphasic waveforms. Left: 1.16 (1.13), PT: monophasic, DP: triphasic    PLAN:  Daily seated leg exercises as taught to pt by physical therapy.  Walking daily as tolerated by his left knee injury and per his orthopedic surgeon.   The patient was counseled re smoking cessation and given several free resources re smoking cessation.  Based on the patient's vascular studies and examination, and after discussing with Dr. Imogene Burn, pt will be scheduled for aortogram with bilateral run off, possible intervention by Dr. Imogene Burn.  I advised him to notify us should he develop concerns re the circulation in his legs.   I discussed in depth with the patient the nature of atherosclerosis, and emphasized the importance of maximal medical management including strict control of blood pressure, blood glucose, and lipid levels, obtaining regular exercise, and cessation of smoking.  The patient is aware that without maximal medical management the underlying atherosclerotic disease process will progress, limiting the benefit of any interventions.  The patient was given information about PAD including signs, symptoms, treatment, what symptoms should prompt the patient to  seek immediate medical care, and risk reduction measures to take.  Charisse March, RN, MSN, FNP-C [facility] Phone: [phone]  Clinic MD: Imogene Burn  04/10/17 3:52 PM

## 2017-04-10 NOTE — Progress Notes (Signed)
[facility] [place]   CC: Follow up peripheral artery occlusive disease  History of Present Illness Brady Bell is a 62 y.o. male  patient of Dr. Imogene Burn who is s/p left common femoral artery to anterior tibial artery bypass with non-reversed ipslateral greater saphenous vein (Date: 09/06/11).  He returns today for follow up, after ABI's and left LE arterial duplex were performed on 03-21-16.  He had been homeless for about 2 years, but is now living in an apt.   He now has disability status.  He denies claudication symptoms, denies non healing wounds, his left knee has a medial meniscus tear and he states that he needs surgical repair.  He had a left knee arthroscopic procedure in February 2016, states subsequently he needs another left knee arthroscopic procedure. Pt reports considerable discomfort in his left knee, wears a brace.  He has polycythemia, had therapeutic phlebotomy on 04-02-17 and states his headaches are less severe.  He is scheduled for bilateral maxillary antrostomy on 05-11-17 for chronic sinusitis.   He denies history of stroke or TIA.  Pt Diabetic: No Pt smoker: smoker (1/2 ppd x since age 26 with several periods of abstinence)  Pt meds include:  Statin :No, states his cholesterol is good ASA: Yes Other anticoagulants/antiplatelets: no      Past Medical History:  Diagnosis Date  . Chronic ankle pain   . Chronic knee pain   . Hypoglycemia   . Migraines   . Peripheral vascular disease (HCC)   . Recurrent sinus infections   . Seizures (HCC)   . Shingles     Social History Social History   Tobacco Use  . Smoking status: Current Every Day Smoker    Packs/day: 0.50    Years: 30.00    Pack years: 15.00    Types: Cigarettes  . Smokeless tobacco: Never Used  . Tobacco comment: pt states that he is trying his best to quit  Substance Use Topics  . Alcohol use: Yes    Alcohol/week: 0.0 oz    Comment: Few drinks per  day  . Drug use: Yes    Types: Marijuana    Comment: last smoked marijuana 03-31-17    Family History Family History  Problem Relation Age of Onset  . Alcohol abuse Father   . Kidney failure Father   . Other Mother        "age"  . Diabetes Unknown     Past Surgical History:  Procedure Laterality Date  . FASCIOTOMY  09/06/2011   Procedure: FASCIOTOMY;  Surgeon: Fransisco Hertz, MD;  Location: [facility] OR;  Service: Vascular;  Laterality: Left;  . FEMORAL-TIBIAL BYPASS GRAFT  09/06/2011   Procedure: BYPASS GRAFT FEMORAL-TIBIAL ARTERY;  Surgeon: Fransisco Hertz, MD;  Location: [facility] OR;  Service: Vascular;  Laterality: Left;  . KNEE CARTILAGE SURGERY Left   . LOWER EXTREMITY ANGIOGRAM N/A 09/05/2011   Procedure: LOWER EXTREMITY ANGIOGRAM;  Surgeon: Sherren Kerns, MD;  Location: [facility] CATH LAB;  Service: Cardiovascular;  Laterality: N/A;  . LOWER EXTREMITY ANGIOGRAM Left 04/15/2012   Procedure: LOWER EXTREMITY ANGIOGRAM;  Surgeon: Fransisco Hertz, MD;  Location: [facility] CATH LAB;  Service: Cardiovascular;  Laterality: Left;  . PR VEIN BYPASS GRAFT,AORTO-FEM-POP  09/06/11   Left   . TOOTH EXTRACTION  June-July-Aug. 2015   Several     No Known Allergies  Current Outpatient Medications  Medication Sig Dispense Refill  . aspirin EC 81 MG tablet Take 81 mg  by mouth daily.    . divalproex (DEPAKOTE ER) 500 MG 24 hr tablet Take 1 tablet (500 mg total) at bedtime by mouth. 90 tablet 4  . eletriptan (RELPAX) 40 MG tablet Take 40 mg by mouth as needed for migraine or headache. May repeat in 2 hours if headache persists or recurs.    . gabapentin (NEURONTIN) 300 MG capsule Take 300 mg by mouth 3 (three) times daily.    . ondansetron (ZOFRAN ODT) 4 MG disintegrating tablet Take 1 tablet (4 mg total) by mouth every 8 (eight) hours as needed. 20 tablet 6  . SUMAtriptan (IMITREX STATDOSE SYSTEM) 6 MG/0.5ML SOAJ Inject 6 mg into the skin as needed. 12 Cartridge 11  . SUMAtriptan (IMITREX) 100 MG tablet Take 1 tablet (100 mg  total) by mouth every 2 (two) hours as needed for migraine. May repeat in 2 hours if headache persists or recurs. 12 tablet 11   No current facility-administered medications for this visit.     ROS: See HPI for pertinent positives and negatives.   Physical Examination  Vitals:   04/10/17 1539 04/10/17 1545 04/10/17 1550  BP: (!) 159/104 (!) 154/105 139/86  Pulse: 97 97 86  Resp: 16    Temp: 98.2 F (36.8 C)    TempSrc: Oral    SpO2: 96%    Weight: 145 lb (65.8 kg)    Height: 5\' 10"  (1.778 m)     Body mass index is 20.81 kg/m.  General: A&O x 3, WDWN. Thin male. Gait: normal  Eyes: PERRLA. Mild/moderate bilateral exophthalmos. Pulmonary: Respirations are non labored, CTAB but somewhat limited air movement, without wheezes, rales, or rhonchi.  Cardiac: regular rythm, no detected murmur.   Carotid Bruits Left Right   Negative  Negative    Abdominal aortic pulse is not palpable.  Radial pulses: are 2+ palpable and =   VASCULAR EXAM: Extremitieswithout ischemic changes  without Gangrene; without open wounds. No peripheral edema.   LE Pulses  LEFT  RIGHT   FEMORAL  2+palpable 2+palpable   POPLITEAL  graft palpable laterally  not palpable  POSTERIOR TIBIAL  not palpable  2+palpable   DORSALIS PEDIS ANTERIOR TIBIAL  2+palpable  Not palpable    Abdomen: soft, NT, no palpable masses.  Skin: no rashes, no ulcers.  Musculoskeletal: No muscle wasting or atrophy. No signs of ischemia in extremities. Brace on left knee.  Neurologic: A&O X 3; Appropriate Affect, MOTOR FUNCTION: moving all extremities equally, motor strength 5/5 throughout except 4/5 in left lower extremity. Speech is fluent/normal. CN 2-12 intact. Psychiatric: Mood appropriate for clinical situation.     ASSESSMENT: [name] is a 62 y.o. male who is s/p left common femoral artery to anterior tibial artery bypass  with non-reversed ipslateral greater saphenous vein (Date: 09/06/11).  He has no claudication sx's with walking, no signs of ischemia in his feet/legs.His walking is limited by left knee pain torn meniscus. Left DP and right PT are 2+ palpable.   Fortunately he does not have DM, but unfortunately he continues to smoke.  Pt states ABI's not done today since medicaid would not cover.  DATA Left LE Arterial Duplex (04/10/17): Significant partially occlusive plaque in the left CFA with increased velocity of 300 cm/s (was 141 cm/s on 03-21-16).  Left leg bypass graft with no evidence of restenosis.  50-74% left CFA stenosis. Increased stenosis in the left  CFA compared to the exam on 03-21-16.    ABI's 03-21-16: Right: 1.19 (1.26  on 03-09-15), triphasic waveforms. Left: 1.16 (1.13), PT: monophasic, DP: triphasic    PLAN:  Daily seated leg exercises as taught to pt by physical therapy.  Walking daily as tolerated by his left knee injury and per his orthopedic surgeon.   The patient was counseled re smoking cessation and given several free resources re smoking cessation.  Based on the patient's vascular studies and examination, and after discussing with Dr. Imogene Burn, pt will be scheduled for aortogram with bilateral run off, possible intervention by Dr. Imogene Burn.  I advised him to notify us should he develop concerns re the circulation in his legs.   I discussed in depth with the patient the nature of atherosclerosis, and emphasized the importance of maximal medical management including strict control of blood pressure, blood glucose, and lipid levels, obtaining regular exercise, and cessation of smoking.  The patient is aware that without maximal medical management the underlying atherosclerotic disease process will progress, limiting the benefit of any interventions.  The patient was given information about PAD including signs, symptoms, treatment, what symptoms should prompt the patient to  seek immediate medical care, and risk reduction measures to take.  Charisse March, RN, MSN, FNP-C [facility] Phone: [phone]  Clinic MD: Imogene Burn  04/10/17 3:52 PM

## 2017-04-10 NOTE — Patient Instructions (Signed)
Steps to Quit Smoking Smoking tobacco can be bad for your health. It can also affect almost every organ in your body. Smoking puts you and people around you at risk for many serious long-lasting (chronic) diseases. Quitting smoking is hard, but it is one of the best things that you can do for your health. It is never too late to quit. What are the benefits of quitting smoking? When you quit smoking, you lower your risk for getting serious diseases and conditions. They can include:  Lung cancer or lung disease.  Heart disease.  Stroke.  Heart attack.  Not being able to have children (infertility).  Weak bones (osteoporosis) and broken bones (fractures).  If you have coughing, wheezing, and shortness of breath, those symptoms may get better when you quit. You may also get sick less often. If you are pregnant, quitting smoking can help to lower your chances of having a baby of low birth weight. What can I do to help me quit smoking? Talk with your doctor about what can help you quit smoking. Some things you can do (strategies) include:  Quitting smoking totally, instead of slowly cutting back how much you smoke over a period of time.  Going to in-person counseling. You are more likely to quit if you go to many counseling sessions.  Using resources and support systems, such as: ? Online chats with a counselor. ? Phone quitlines. ? Printed self-help materials. ? Support groups or group counseling. ? Text messaging programs. ? Mobile phone apps or applications.  Taking medicines. Some of these medicines may have nicotine in them. If you are pregnant or breastfeeding, do not take any medicines to quit smoking unless your doctor says it is okay. Talk with your doctor about counseling or other things that can help you.  Talk with your doctor about using more than one strategy at the same time, such as taking medicines while you are also going to in-person counseling. This can help make  quitting easier. What things can I do to make it easier to quit? Quitting smoking might feel very hard at first, but there is a lot that you can do to make it easier. Take these steps:  Talk to your family and friends. Ask them to support and encourage you.  Call phone quitlines, reach out to support groups, or work with a counselor.  Ask people who smoke to not smoke around you.  Avoid places that make you want (trigger) to smoke, such as: ? Bars. ? Parties. ? Smoke-break areas at work.  Spend time with people who do not smoke.  Lower the stress in your life. Stress can make you want to smoke. Try these things to help your stress: ? Getting regular exercise. ? Deep-breathing exercises. ? Yoga. ? Meditating. ? Doing a body scan. To do this, close your eyes, focus on one area of your body at a time from head to toe, and notice which parts of your body are tense. Try to relax the muscles in those areas.  Download or buy apps on your mobile phone or tablet that can help you stick to your quit plan. There are many free apps, such as QuitGuide from the CDC (Centers for Disease Control and Prevention). You can find more support from smokefree.gov and other websites.  This information is not intended to replace advice given to you by your health care provider. Make sure you discuss any questions you have with your health care provider. Document Released: 01/11/2009 Document   Revised: 11/13/2015 Document Reviewed: 08/01/2014 Elsevier Interactive Patient Education  2018 Elsevier Inc.     Peripheral Vascular Disease Peripheral vascular disease (PVD) is a disease of the blood vessels that are not part of your heart and brain. A simple term for PVD is poor circulation. In most cases, PVD narrows the blood vessels that carry blood from your heart to the rest of your body. This can result in a decreased supply of blood to your arms, legs, and internal organs, like your stomach or kidneys.  However, it most often affects a person's lower legs and feet. There are two types of PVD.  Organic PVD. This is the more common type. It is caused by damage to the structure of blood vessels.  Functional PVD. This is caused by conditions that make blood vessels contract and tighten (spasm).  Without treatment, PVD tends to get worse over time. PVD can also lead to acute ischemic limb. This is when an arm or limb suddenly has trouble getting enough blood. This is a medical emergency. Follow these instructions at home:  Take medicines only as told by your doctor.  Do not use any tobacco products, including cigarettes, chewing tobacco, or electronic cigarettes. If you need help quitting, ask your doctor.  Lose weight if you are overweight, and maintain a healthy weight as told by your doctor.  Eat a diet that is low in fat and cholesterol. If you need help, ask your doctor.  Exercise regularly. Ask your doctor for some good activities for you.  Take good care of your feet. ? Wear comfortable shoes that fit well. ? Check your feet often for any cuts or sores. Contact a doctor if:  You have cramps in your legs while walking.  You have leg pain when you are at rest.  You have coldness in a leg or foot.  Your skin changes.  You are unable to get or have an erection (erectile dysfunction).  You have cuts or sores on your feet that are not healing. Get help right away if:  Your arm or leg turns cold and blue.  Your arms or legs become red, warm, swollen, painful, or numb.  You have chest pain or trouble breathing.  You suddenly have weakness in your face, arm, or leg.  You become very confused or you cannot speak.  You suddenly have a very bad headache.  You suddenly cannot see. This information is not intended to replace advice given to you by your health care provider. Make sure you discuss any questions you have with your health care provider. Document Released:  06/11/2009 Document Revised: 08/23/2015 Document Reviewed: 08/25/2013 Elsevier Interactive Patient Education  2017 Elsevier Inc.  

## 2017-04-13 ENCOUNTER — Other Ambulatory Visit: Payer: Self-pay | Admitting: *Deleted

## 2017-04-23 ENCOUNTER — Other Ambulatory Visit: Payer: Self-pay | Admitting: *Deleted

## 2017-04-23 ENCOUNTER — Ambulatory Visit (HOSPITAL_COMMUNITY)
Admission: RE | Admit: 2017-04-23 | Discharge: 2017-04-23 | Disposition: A | Discharge status: Home / Self Care | Payer: Medicaid Other | Source: Ambulatory Visit | Attending: Vascular Surgery | Admitting: Vascular Surgery

## 2017-04-23 ENCOUNTER — Encounter (HOSPITAL_COMMUNITY)
Admission: RE | Disposition: A | Discharge status: Home / Self Care | Payer: Self-pay | Source: Ambulatory Visit | Attending: Vascular Surgery

## 2017-04-23 ENCOUNTER — Encounter: Payer: Self-pay | Admitting: *Deleted

## 2017-04-23 ENCOUNTER — Encounter (HOSPITAL_COMMUNITY): Payer: Self-pay | Admitting: *Deleted

## 2017-04-23 DIAGNOSIS — F17210 Nicotine dependence, cigarettes, uncomplicated: Secondary | ICD-10-CM | POA: Diagnosis not present

## 2017-04-23 DIAGNOSIS — I70202 Unspecified atherosclerosis of native arteries of extremities, left leg: Secondary | ICD-10-CM | POA: Insufficient documentation

## 2017-04-23 DIAGNOSIS — G8929 Other chronic pain: Secondary | ICD-10-CM | POA: Diagnosis not present

## 2017-04-23 DIAGNOSIS — I739 Peripheral vascular disease, unspecified: Secondary | ICD-10-CM | POA: Diagnosis present

## 2017-04-23 DIAGNOSIS — Z7982 Long term (current) use of aspirin: Secondary | ICD-10-CM | POA: Diagnosis not present

## 2017-04-23 DIAGNOSIS — Z79899 Other long term (current) drug therapy: Secondary | ICD-10-CM | POA: Insufficient documentation

## 2017-04-23 DIAGNOSIS — I771 Stricture of artery: Secondary | ICD-10-CM

## 2017-04-23 DIAGNOSIS — D751 Secondary polycythemia: Secondary | ICD-10-CM | POA: Insufficient documentation

## 2017-04-23 HISTORY — PX: LOWER EXTREMITY ANGIOGRAPHY: CATH118251

## 2017-04-23 HISTORY — PX: ABDOMINAL AORTOGRAM: CATH118222

## 2017-04-23 LAB — POCT I-STAT, CHEM 8
BUN: 7 mg/dL (ref 6–20)
BUN: 5 mg/dL — AB (ref 6–20)
CALCIUM ION: 1.22 mmol/L (ref 1.15–1.40)
CHLORIDE: 100 mmol/L — AB (ref 101–111)
CHLORIDE: 95 mmol/L — AB (ref 101–111)
CREATININE: 0.9 mg/dL (ref 0.61–1.24)
Calcium, Ion: 1.08 mmol/L — ABNORMAL LOW (ref 1.15–1.40)
Creatinine, Ser: 1 mg/dL (ref 0.61–1.24)
Glucose, Bld: 78 mg/dL (ref 65–99)
Glucose, Bld: 81 mg/dL (ref 65–99)
HCT: 50 % (ref 39.0–52.0)
HEMATOCRIT: 50 % (ref 39.0–52.0)
Hemoglobin: 17 g/dL (ref 13.0–17.0)
Hemoglobin: 17 g/dL (ref 13.0–17.0)
POTASSIUM: 5.9 mmol/L — AB (ref 3.5–5.1)
POTASSIUM: 4.2 mmol/L (ref 3.5–5.1)
SODIUM: 139 mmol/L (ref 135–145)
Sodium: 135 mmol/L (ref 135–145)
TCO2: 32 mmol/L (ref 22–32)
TCO2: 30 mmol/L (ref 22–32)

## 2017-04-23 SURGERY — ABDOMINAL AORTOGRAM
Anesthesia: LOCAL

## 2017-04-23 MED ORDER — HEPARIN (PORCINE) IN NACL 2-0.9 UNIT/ML-% IJ SOLN
INTRAMUSCULAR | Status: AC | PRN
  Administered 2017-04-23: 1000 mL

## 2017-04-23 MED ORDER — SODIUM CHLORIDE 0.9 % IV SOLN: 250.0000 mL | INTRAVENOUS | Status: DC | PRN

## 2017-04-23 MED ORDER — MIDAZOLAM HCL 2 MG/2ML IJ SOLN
INTRAMUSCULAR | Status: AC
  Filled 2017-04-23: qty 2

## 2017-04-23 MED ORDER — LABETALOL HCL 5 MG/ML IV SOLN: 10.0000 mg | INTRAVENOUS | Status: DC | PRN

## 2017-04-23 MED ORDER — HEPARIN (PORCINE) IN NACL 2-0.9 UNIT/ML-% IJ SOLN
INTRAMUSCULAR | Status: AC
  Filled 2017-04-23: qty 1000

## 2017-04-23 MED ORDER — HYDRALAZINE HCL 20 MG/ML IJ SOLN: 5.0000 mg | INTRAMUSCULAR | Status: DC | PRN

## 2017-04-23 MED ORDER — SODIUM CHLORIDE 0.9% FLUSH: 3.0000 mL | Freq: Two times a day (BID) | INTRAVENOUS | Status: DC

## 2017-04-23 MED ORDER — FENTANYL CITRATE (PF) 100 MCG/2ML IJ SOLN
INTRAMUSCULAR | Status: DC | PRN
  Administered 2017-04-23 (×2): 50 ug via INTRAVENOUS

## 2017-04-23 MED ORDER — LIDOCAINE HCL (PF) 1 % IJ SOLN
INTRAMUSCULAR | Status: DC | PRN
  Administered 2017-04-23: 16 mL

## 2017-04-23 MED ORDER — IODIXANOL 320 MG/ML IV SOLN
INTRAVENOUS | Status: DC | PRN
  Administered 2017-04-23: 100 mL via INTRAVENOUS

## 2017-04-23 MED ORDER — SODIUM CHLORIDE 0.9 % IV SOLN
INTRAVENOUS | Status: DC
  Administered 2017-04-23: 11:00:00 via INTRAVENOUS

## 2017-04-23 MED ORDER — FENTANYL CITRATE (PF) 100 MCG/2ML IJ SOLN
INTRAMUSCULAR | Status: AC
  Filled 2017-04-23: qty 2

## 2017-04-23 MED ORDER — MIDAZOLAM HCL 2 MG/2ML IJ SOLN
INTRAMUSCULAR | Status: DC | PRN
  Administered 2017-04-23: 1 mg via INTRAVENOUS

## 2017-04-23 MED ORDER — SODIUM CHLORIDE 0.9% FLUSH: 3.0000 mL | INTRAVENOUS | Status: DC | PRN

## 2017-04-23 MED ORDER — LIDOCAINE HCL 1 % IJ SOLN
INTRAMUSCULAR | Status: AC
  Filled 2017-04-23: qty 20

## 2017-04-23 MED ORDER — SODIUM CHLORIDE 0.9 % WEIGHT BASED INFUSION: 1.0000 mL/kg/h | INTRAVENOUS | Status: DC

## 2017-04-23 SURGICAL SUPPLY — 13 items
CATH OMNI FLUSH 5F 65CM (CATHETERS) ×1 IMPLANT
CATH SOFT-VU 4F 65 STRAIGHT (CATHETERS) IMPLANT
CATH SOFT-VU STRAIGHT 4F 65CM (CATHETERS) ×3
COVER PRB 48X5XTLSCP FOLD TPE (BAG) IMPLANT
COVER PROBE 5X48 (BAG) ×3
DEVICE TORQUE .025-.038 (MISCELLANEOUS) ×1 IMPLANT
GUIDEWIRE ANGLED .035X150CM (WIRE) ×1 IMPLANT
KIT PV (KITS) ×3 IMPLANT
SHEATH PINNACLE 5F 10CM (SHEATH) ×1 IMPLANT
SYR MEDRAD MARK V 150ML (SYRINGE) ×3 IMPLANT
TRANSDUCER W/STOPCOCK (MISCELLANEOUS) ×3 IMPLANT
TRAY PV CATH (CUSTOM PROCEDURE TRAY) ×3 IMPLANT
WIRE BENTSON .035X145CM (WIRE) ×1 IMPLANT

## 2017-04-23 NOTE — Op Note (Signed)
OPERATIVE NOTE   PROCEDURE: 1.  right common femoral artery cannulation under ultrasound guidance 2.  Placement of catheter in aorta 3.  Aortogram 4.  Conscious sedation for 31 min 5.  Second order arterial selection 6.  left leg runoff via catheter 7.  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  POST-OPERATIVE DIAGNOSIS: same as above   SURGEON: Leonides Sake, MD  ANESTHESIA: conscious sedation  ESTIMATED BLOOD LOSS: 30 cc  CONTRAST: 100 cc  FINDING(S):  Aorta: patent  Superior mesenteric artery: not seen Celiac artery: not seen   Right Left  RA Patent, accessory renal artery feed posterior portion of kidney Patent  CIA patent patent  EIA patent patent  IIA patent patent  CFA patent Patent, 75-90% stenosis proximal to bypass, distal to inguinal ligament  SFA patent Occluded, widely patent CFA to AT bypass (lateral route)  PFA patent patent  Pop patent occluded  Trif patent patent  AT Patent proximally, occludes distally Patent, distal anastomosis widely patent, only runoff to left foot  Pero Patent proximally, occludes distally Occluded  PT Patent, dominant runoff Occluded   SPECIMEN(S):  none  INDICATIONS:   Brady Bell is a 62 y.o. male who presents with high grade stenosis in left common femoral artery in setting of prior left common femoral artery to anterior tibial artery bypass.  The patient presents for: aortogram, bilateral leg runoff, and possible intervention.  I discussed with the patient the nature of angiographic procedures, especially the limited patencies of any endovascular intervention.  I discussed with the patient the nature of angiographic procedures, especially the limited patencies of any endovascular intervention.  The patient is aware of that the risks of an angiographic procedure include but are not limited to: bleeding, infection, access site complications, renal failure,  embolization, rupture of vessel, dissection, arteriovenous fistula, possible need for emergent surgical intervention, possible need for surgical procedures to treat the patient's pathology, anaphylactic reaction to contrast, and stroke and death.  The patient is aware of the risks and agrees to proceed.    DESCRIPTION: After full informed consent was obtained from the patient, the patient was brought back to the angiography suite.  The patient was placed supine upon the angiography table and connected to cardiopulmonary monitoring equipment.  The patient was then given conscious sedation, the amounts of which are documented in the patient's chart.  The patient was prepped and drape in the standard fashion for an angiographic procedure.  At this point, attention was turned to the right groin.  Under ultrasound guidance,  The subcutaneous tissue surrounding the right common femoral artery was anesthesized with 1% lidocaine with epinephrine.  The artery was then cannulated with a 18 gauge needle.  The Bentson wire was passed up into the aorta.  The needle was exchanged for a 5-Fr sheath, which was advanced over the wire into the common femoral artery.  The dilator was then removed.   The Omniflush catheter was then loaded over the wire up to the level of L1.  The catheter was connected to the power injector circuit.  After de-airring and de-clotting the circuit, a power injector aortogram was completed.  The findings are listed above.  The Bentson wire was replaced in the catheter, and using the [facility] and Omniflush catheter, the left common iliac artery was selected.  The wire advanced into the external iliac artery.  Due inability to advance the catheter, the Omniflush was exchanged for a  straight catheter, which was advanced into the external iliac artery.  An automated left leg runoff was completed after de-airring and de-clotting the circuit.  The findings are as listed above.    The straight  catheter removed together from the sheath.  The right sheath was aspirated and no clot was present.  The sheath was connected to the power injector circuit.  An automated right leg runoff was completed after de-airring and de-clotting the circuit.  The findings are as listed above.  The sheath was aspirated.  No clots were present and the sheath was reloaded with heparinized saline.    Based on the images, this patient needs: redo left iliofemoral endarterectomy and patch angioplasty.  The findings in the right leg are also consistent with a Buerger's pattern of atherosclerotic disease, so smoking cessation was again emphasized.   COMPLICATIONS: none  CONDITION: stable   Leonides SakeBrian Chen, MD, [facility] [place] Office: [phone] Pager: [phone]  04/23/2017, 12:38 PM

## 2017-04-23 NOTE — Progress Notes (Signed)
Report given pt accepted. 

## 2017-04-23 NOTE — Discharge Instructions (Signed)

## 2017-04-23 NOTE — Interval H&P Note (Signed)
   History and Physical Update  The patient was interviewed and re-examined.  The patient's previous History and Physical has been reviewed and is unchanged from my NP's consult.  There is no change in the plan of care: aortogram, left leg runoff, and possible intervention.   I discussed with the patient the nature of angiographic procedures, especially the limited patencies of any endovascular intervention.    The patient is aware of that the risks of an angiographic procedure include but are not limited to: bleeding, infection, access site complications, renal failure, embolization, rupture of vessel, dissection, arteriovenous fistula, possible need for emergent surgical intervention, possible need for surgical procedures to treat the patient's pathology, anaphylactic reaction to contrast, and stroke and death.    The patient is aware of the risks and agrees to proceed.   Leonides SakeBrian Chen, MD, FACS [facility] [place] Office: [phone] Pager: [phone]  04/23/2017, 10:59 AM

## 2017-04-23 NOTE — Progress Notes (Signed)
Site area: Medical illustratort fem art Site Prior to Removal:  Level 0 Pressure Applied For: 25 min Manual: yes   Patient Status During Pull:  A/O Post Pull Site:  Level 0 Post Pull Instructions Given:  Post instructions given and pt understands. Post Pull Pulses Present: 2+ rt Pt Dressing Applied:  Tegaderm and a 4x4 applied. Bedrest begins @ 13:15:00 Comments: Pt leaves cath lab holding area in stable condition. Rt fem art site is unremarkable. Dressing is CDI.  Sheathed pulled by Sallye Latiffany Nichols-RN and observed by TPMink-RCIS

## 2017-04-24 ENCOUNTER — Encounter (HOSPITAL_COMMUNITY): Payer: Self-pay | Admitting: Vascular Surgery

## 2017-04-24 MED FILL — Lidocaine HCl Local Inj 1%: INTRAMUSCULAR | Qty: 20 | Status: AC

## 2017-04-27 ENCOUNTER — Other Ambulatory Visit: Payer: Self-pay

## 2017-04-27 ENCOUNTER — Encounter (HOSPITAL_COMMUNITY): Payer: Self-pay | Admitting: *Deleted

## 2017-04-27 NOTE — Anesthesia Preprocedure Evaluation (Addendum)
Anesthesia Evaluation  Patient identified by MRN, date of birth, ID band Patient awake    Reviewed: Allergy & Precautions, NPO status , Patient's Chart, lab work & pertinent test results  Airway Mallampati: II  TM Distance: >3 FB Neck ROM: Full    Dental  (+) Teeth Intact, Dental Advisory Given   Pulmonary Current Smoker,     + decreased breath sounds      Cardiovascular + Peripheral Vascular Disease   Rhythm:Regular Rate:Normal     Neuro/Psych  Headaches, Seizures -,     GI/Hepatic GERD  ,(+) Hepatitis -, A  Endo/Other  negative endocrine ROS  Renal/GU negative Renal ROS     Musculoskeletal negative musculoskeletal ROS (+)   Abdominal Normal abdominal exam  (+)   Peds  Hematology   Anesthesia Other Findings   Reproductive/Obstetrics                            Lab Results  Component Value Date   WBC 7.3 04/02/2017   HGB 17.0 04/23/2017   HCT 50.0 04/23/2017   MCV 101.1 (H) 04/02/2017   PLT 238 04/02/2017   Lab Results  Component Value Date   CREATININE 1.00 04/23/2017   BUN 5 (L) 04/23/2017   NA 139 04/23/2017   K 4.2 04/23/2017   CL 95 (L) 04/23/2017   CO2 25 12/08/2016   Lab Results  Component Value Date   INR 0.94 09/04/2011   EKG: NSR with incomplete RBBB  Anesthesia Physical Anesthesia Plan  ASA: III  Anesthesia Plan: General   Post-op Pain Management:    Induction: Intravenous  PONV Risk Score and Plan: 2 and Ondansetron, Dexamethasone and Midazolam  Airway Management Planned: Oral ETT  Additional Equipment: None  Intra-op Plan:   Post-operative Plan: Extubation in OR  Informed Consent: I have reviewed the patients History and Physical, chart, labs and discussed the procedure including the risks, benefits and alternatives for the proposed anesthesia with the patient or authorized representative who has indicated his/her understanding and acceptance.    Dental advisory given  Plan Discussed with: CRNA  Anesthesia Plan Comments: (Possible arterial line. )       Anesthesia Quick Evaluation

## 2017-04-27 NOTE — Progress Notes (Signed)
Pt denies SOB, chest pain, and being under the care of a cardiologist. Pt denies having a stress test, echo and cardiac cath. Pt denies having a chest x ray within the last year. Pt made aware to stop taking vitamins, fish oil and herbal medications. Do not take any NSAIDs ie: Ibuprofen, Advil, Naproxen ( Aleve), Motrin, BC and Goody Powder. Pt verbalized understanding of all pre-op instructions. 

## 2017-04-28 ENCOUNTER — Inpatient Hospital Stay (HOSPITAL_COMMUNITY): Payer: Medicaid Other | Admitting: Anesthesiology

## 2017-04-28 ENCOUNTER — Encounter (HOSPITAL_COMMUNITY): Payer: Self-pay | Admitting: *Deleted

## 2017-04-28 ENCOUNTER — Encounter (HOSPITAL_COMMUNITY)
Admission: RE | Disposition: A | Discharge status: Home / Self Care | Payer: Self-pay | Source: Ambulatory Visit | Attending: Vascular Surgery

## 2017-04-28 ENCOUNTER — Inpatient Hospital Stay (HOSPITAL_COMMUNITY)
Admission: RE | Admit: 2017-04-28 | Discharge: 2017-04-29 | DRG: 271 | Disposition: A | Discharge status: Home / Self Care | Payer: Medicaid Other | Source: Ambulatory Visit | Attending: Vascular Surgery | Admitting: Vascular Surgery

## 2017-04-28 DIAGNOSIS — I7092 Chronic total occlusion of artery of the extremities: Secondary | ICD-10-CM | POA: Diagnosis present

## 2017-04-28 DIAGNOSIS — F17210 Nicotine dependence, cigarettes, uncomplicated: Secondary | ICD-10-CM | POA: Diagnosis present

## 2017-04-28 DIAGNOSIS — I70202 Unspecified atherosclerosis of native arteries of extremities, left leg: Principal | ICD-10-CM | POA: Diagnosis present

## 2017-04-28 DIAGNOSIS — S83207A Unspecified tear of unspecified meniscus, current injury, left knee, initial encounter: Secondary | ICD-10-CM | POA: Diagnosis present

## 2017-04-28 DIAGNOSIS — R402413 Glasgow coma scale score 13-15, at hospital admission: Secondary | ICD-10-CM | POA: Diagnosis present

## 2017-04-28 DIAGNOSIS — G43909 Migraine, unspecified, not intractable, without status migrainosus: Secondary | ICD-10-CM | POA: Diagnosis present

## 2017-04-28 DIAGNOSIS — X58XXXA Exposure to other specified factors, initial encounter: Secondary | ICD-10-CM | POA: Diagnosis present

## 2017-04-28 DIAGNOSIS — G40909 Epilepsy, unspecified, not intractable, without status epilepticus: Secondary | ICD-10-CM | POA: Diagnosis present

## 2017-04-28 DIAGNOSIS — R262 Difficulty in walking, not elsewhere classified: Secondary | ICD-10-CM | POA: Diagnosis present

## 2017-04-28 DIAGNOSIS — Z7982 Long term (current) use of aspirin: Secondary | ICD-10-CM

## 2017-04-28 DIAGNOSIS — D751 Secondary polycythemia: Secondary | ICD-10-CM | POA: Diagnosis present

## 2017-04-28 DIAGNOSIS — M25579 Pain in unspecified ankle and joints of unspecified foot: Secondary | ICD-10-CM | POA: Diagnosis present

## 2017-04-28 DIAGNOSIS — Z95820 Peripheral vascular angioplasty status with implants and grafts: Secondary | ICD-10-CM | POA: Diagnosis not present

## 2017-04-28 DIAGNOSIS — I739 Peripheral vascular disease, unspecified: Secondary | ICD-10-CM | POA: Diagnosis present

## 2017-04-28 DIAGNOSIS — I771 Stricture of artery: Secondary | ICD-10-CM | POA: Diagnosis not present

## 2017-04-28 DIAGNOSIS — M25569 Pain in unspecified knee: Secondary | ICD-10-CM | POA: Diagnosis present

## 2017-04-28 DIAGNOSIS — Z8619 Personal history of other infectious and parasitic diseases: Secondary | ICD-10-CM

## 2017-04-28 DIAGNOSIS — G8929 Other chronic pain: Secondary | ICD-10-CM | POA: Diagnosis present

## 2017-04-28 DIAGNOSIS — Z79899 Other long term (current) drug therapy: Secondary | ICD-10-CM | POA: Diagnosis not present

## 2017-04-28 HISTORY — DX: Inflammatory liver disease, unspecified: K75.9

## 2017-04-28 HISTORY — DX: Gastro-esophageal reflux disease without esophagitis: K21.9

## 2017-04-28 HISTORY — PX: ENDARTERECTOMY FEMORAL: SHX5804

## 2017-04-28 HISTORY — PX: PATCH ANGIOPLASTY: SHX6230

## 2017-04-28 LAB — SURGICAL PCR SCREEN
MRSA, PCR: NEGATIVE
Staphylococcus aureus: POSITIVE — AB

## 2017-04-28 LAB — CBC
HCT: 45.5 % (ref 39.0–52.0)
Hemoglobin: 15.6 g/dL (ref 13.0–17.0)
MCH: 34.7 pg — AB (ref 26.0–34.0)
MCHC: 34.3 g/dL (ref 30.0–36.0)
MCV: 101.3 fL — AB (ref 78.0–100.0)
PLATELETS: 175 10*3/uL (ref 150–400)
RBC: 4.49 MIL/uL (ref 4.22–5.81)
RDW: 13.3 % (ref 11.5–15.5)
WBC: 7.7 10*3/uL (ref 4.0–10.5)

## 2017-04-28 LAB — COMPREHENSIVE METABOLIC PANEL
ALT: 23 U/L (ref 17–63)
AST: 37 U/L (ref 15–41)
Albumin: 3.1 g/dL — ABNORMAL LOW (ref 3.5–5.0)
Alkaline Phosphatase: 92 U/L (ref 38–126)
Anion gap: 12 (ref 5–15)
BUN: 9 mg/dL (ref 6–20)
CHLORIDE: 102 mmol/L (ref 101–111)
CO2: 22 mmol/L (ref 22–32)
CREATININE: 1.1 mg/dL (ref 0.61–1.24)
Calcium: 8.7 mg/dL — ABNORMAL LOW (ref 8.9–10.3)
Glucose, Bld: 100 mg/dL — ABNORMAL HIGH (ref 65–99)
Potassium: 3.8 mmol/L (ref 3.5–5.1)
Sodium: 136 mmol/L (ref 135–145)
TOTAL PROTEIN: 6.2 g/dL — AB (ref 6.5–8.1)
Total Bilirubin: 0.9 mg/dL (ref 0.3–1.2)

## 2017-04-28 LAB — PROTIME-INR
INR: 1.09
PROTHROMBIN TIME: 14 s (ref 11.4–15.2)

## 2017-04-28 LAB — TYPE AND SCREEN
ABO/RH(D): A POS
Antibody Screen: NEGATIVE

## 2017-04-28 LAB — APTT: aPTT: 30 seconds (ref 24–36)

## 2017-04-28 SURGERY — ENDARTERECTOMY, FEMORAL
Anesthesia: General | Site: Groin | Laterality: Left

## 2017-04-28 MED ORDER — LACTATED RINGERS IV SOLN
INTRAVENOUS | Status: DC | PRN
  Administered 2017-04-28 (×2): via INTRAVENOUS

## 2017-04-28 MED ORDER — FENTANYL CITRATE (PF) 100 MCG/2ML IJ SOLN
INTRAMUSCULAR | Status: DC | PRN
  Administered 2017-04-28 (×2): 50 ug via INTRAVENOUS
  Administered 2017-04-28: 100 ug via INTRAVENOUS
  Administered 2017-04-28: 50 ug via INTRAVENOUS

## 2017-04-28 MED ORDER — SENNOSIDES-DOCUSATE SODIUM 8.6-50 MG PO TABS: 1.0000 | ORAL_TABLET | Freq: Every evening | ORAL | Status: DC | PRN

## 2017-04-28 MED ORDER — LIDOCAINE 2% (20 MG/ML) 5 ML SYRINGE
INTRAMUSCULAR | Status: AC
  Filled 2017-04-28: qty 5

## 2017-04-28 MED ORDER — DEXTROSE 5 % IV SOLN
1.5000 g | Freq: Two times a day (BID) | INTRAVENOUS | Status: AC
  Administered 2017-04-28 – 2017-04-29 (×2): 1.5 g via INTRAVENOUS
  Filled 2017-04-28 (×2): qty 1.5

## 2017-04-28 MED ORDER — PROPOFOL 10 MG/ML IV BOLUS
INTRAVENOUS | Status: DC | PRN
  Administered 2017-04-28: 150 mg via INTRAVENOUS

## 2017-04-28 MED ORDER — DOCUSATE SODIUM 100 MG PO CAPS
100.0000 mg | ORAL_CAPSULE | Freq: Every day | ORAL | Status: DC
  Administered 2017-04-29: 100 mg via ORAL
  Filled 2017-04-28: qty 1

## 2017-04-28 MED ORDER — CHLORHEXIDINE GLUCONATE CLOTH 2 % EX PADS: 6.0000 | MEDICATED_PAD | Freq: Once | CUTANEOUS | Status: DC

## 2017-04-28 MED ORDER — LIDOCAINE HCL (CARDIAC) 20 MG/ML IV SOLN
INTRAVENOUS | Status: DC | PRN
  Administered 2017-04-28: 100 mg via INTRAVENOUS

## 2017-04-28 MED ORDER — DIVALPROEX SODIUM ER 500 MG PO TB24
500.0000 mg | ORAL_TABLET | Freq: Every day | ORAL | Status: DC
  Administered 2017-04-28: 500 mg via ORAL
  Filled 2017-04-28: qty 1

## 2017-04-28 MED ORDER — POTASSIUM CHLORIDE CRYS ER 20 MEQ PO TBCR: 20.0000 meq | EXTENDED_RELEASE_TABLET | Freq: Every day | ORAL | Status: DC | PRN

## 2017-04-28 MED ORDER — FENTANYL CITRATE (PF) 250 MCG/5ML IJ SOLN
INTRAMUSCULAR | Status: AC
  Filled 2017-04-28: qty 5

## 2017-04-28 MED ORDER — MEPERIDINE HCL 25 MG/ML IJ SOLN: 6.2500 mg | INTRAMUSCULAR | Status: DC | PRN

## 2017-04-28 MED ORDER — MAGNESIUM SULFATE 2 GM/50ML IV SOLN: 2.0000 g | Freq: Every day | INTRAVENOUS | Status: DC | PRN

## 2017-04-28 MED ORDER — ENOXAPARIN SODIUM 40 MG/0.4ML ~~LOC~~ SOLN: 40.0000 mg | SUBCUTANEOUS | Status: DC

## 2017-04-28 MED ORDER — SODIUM CHLORIDE 0.9 % IV SOLN
INTRAVENOUS | Status: DC | PRN
  Administered 2017-04-28: 500 mL

## 2017-04-28 MED ORDER — HEPARIN SODIUM (PORCINE) 1000 UNIT/ML IJ SOLN
INTRAMUSCULAR | Status: AC
  Filled 2017-04-28: qty 1

## 2017-04-28 MED ORDER — 0.9 % SODIUM CHLORIDE (POUR BTL) OPTIME
TOPICAL | Status: DC | PRN
  Administered 2017-04-28: 2000 mL

## 2017-04-28 MED ORDER — SODIUM CHLORIDE 0.9 % IV SOLN
INTRAVENOUS | Status: DC
  Administered 2017-04-28: 16:00:00 via INTRAVENOUS

## 2017-04-28 MED ORDER — PROTAMINE SULFATE 10 MG/ML IV SOLN
INTRAVENOUS | Status: DC | PRN
  Administered 2017-04-28: 50 mg via INTRAVENOUS

## 2017-04-28 MED ORDER — ADULT MULTIVITAMIN W/MINERALS CH
1.0000 | ORAL_TABLET | Freq: Every day | ORAL | Status: DC
  Administered 2017-04-28 – 2017-04-29 (×2): 1 via ORAL
  Filled 2017-04-28 (×2): qty 1

## 2017-04-28 MED ORDER — OXYCODONE HCL 5 MG PO TABS
5.0000 mg | ORAL_TABLET | ORAL | Status: DC | PRN
  Administered 2017-04-28 – 2017-04-29 (×4): 10 mg via ORAL
  Filled 2017-04-28 (×3): qty 2

## 2017-04-28 MED ORDER — GABAPENTIN 300 MG PO CAPS
300.0000 mg | ORAL_CAPSULE | Freq: Every day | ORAL | Status: DC
  Administered 2017-04-29: 300 mg via ORAL
  Filled 2017-04-28: qty 1

## 2017-04-28 MED ORDER — MORPHINE SULFATE (PF) 2 MG/ML IV SOLN: 1.0000 mg | INTRAVENOUS | Status: DC | PRN

## 2017-04-28 MED ORDER — ASPIRIN EC 81 MG PO TBEC
81.0000 mg | DELAYED_RELEASE_TABLET | Freq: Every day | ORAL | Status: DC
  Administered 2017-04-29: 81 mg via ORAL
  Filled 2017-04-28: qty 1

## 2017-04-28 MED ORDER — ONDANSETRON HCL 4 MG/2ML IJ SOLN
INTRAMUSCULAR | Status: AC
  Filled 2017-04-28: qty 2

## 2017-04-28 MED ORDER — DEXTROSE 5 % IV SOLN
1.5000 g | INTRAVENOUS | Status: AC
  Administered 2017-04-28: 1.5 g via INTRAVENOUS
  Filled 2017-04-28: qty 1.5

## 2017-04-28 MED ORDER — ROCURONIUM BROMIDE 100 MG/10ML IV SOLN
INTRAVENOUS | Status: DC | PRN
  Administered 2017-04-28: 60 mg via INTRAVENOUS

## 2017-04-28 MED ORDER — HYDRALAZINE HCL 20 MG/ML IJ SOLN: 5.0000 mg | INTRAMUSCULAR | Status: DC | PRN

## 2017-04-28 MED ORDER — BISACODYL 5 MG PO TBEC: 5.0000 mg | DELAYED_RELEASE_TABLET | Freq: Every day | ORAL | Status: DC | PRN

## 2017-04-28 MED ORDER — PHENYLEPHRINE 40 MCG/ML (10ML) SYRINGE FOR IV PUSH (FOR BLOOD PRESSURE SUPPORT)
PREFILLED_SYRINGE | INTRAVENOUS | Status: AC
  Filled 2017-04-28: qty 20

## 2017-04-28 MED ORDER — ACETAMINOPHEN 325 MG PO TABS
325.0000 mg | ORAL_TABLET | ORAL | Status: DC | PRN
  Administered 2017-04-28 (×2): 650 mg via ORAL
  Filled 2017-04-28 (×2): qty 2

## 2017-04-28 MED ORDER — PHENYLEPHRINE HCL 10 MG/ML IJ SOLN
INTRAMUSCULAR | Status: DC | PRN
  Administered 2017-04-28: 80 ug via INTRAVENOUS
  Administered 2017-04-28: 40 ug via INTRAVENOUS
  Administered 2017-04-28 (×4): 80 ug via INTRAVENOUS

## 2017-04-28 MED ORDER — HYDROMORPHONE HCL 1 MG/ML IJ SOLN
INTRAMUSCULAR | Status: AC
  Administered 2017-04-28: 0.5 mg via INTRAVENOUS
  Filled 2017-04-28: qty 1

## 2017-04-28 MED ORDER — HEPARIN SODIUM (PORCINE) 1000 UNIT/ML IJ SOLN
INTRAMUSCULAR | Status: DC | PRN
  Administered 2017-04-28: 7000 [IU] via INTRAVENOUS
  Administered 2017-04-28: 2000 [IU] via INTRAVENOUS

## 2017-04-28 MED ORDER — SODIUM CHLORIDE 0.9 % IV SOLN: INTRAVENOUS | Status: DC

## 2017-04-28 MED ORDER — ACETAMINOPHEN 325 MG RE SUPP
325.0000 mg | RECTAL | Status: DC | PRN
  Filled 2017-04-28: qty 2

## 2017-04-28 MED ORDER — METOPROLOL TARTRATE 5 MG/5ML IV SOLN: 2.0000 mg | INTRAVENOUS | Status: DC | PRN

## 2017-04-28 MED ORDER — ELETRIPTAN HYDROBROMIDE 40 MG PO TABS: 40.0000 mg | ORAL_TABLET | ORAL | Status: DC | PRN

## 2017-04-28 MED ORDER — PHENOL 1.4 % MT LIQD: 1.0000 | OROMUCOSAL | Status: DC | PRN

## 2017-04-28 MED ORDER — LABETALOL HCL 5 MG/ML IV SOLN: 10.0000 mg | INTRAVENOUS | Status: DC | PRN

## 2017-04-28 MED ORDER — ROCURONIUM BROMIDE 10 MG/ML (PF) SYRINGE
PREFILLED_SYRINGE | INTRAVENOUS | Status: AC
  Filled 2017-04-28: qty 5

## 2017-04-28 MED ORDER — HEMOSTATIC AGENTS (NO CHARGE) OPTIME
TOPICAL | Status: DC | PRN
  Administered 2017-04-28: 1 via TOPICAL

## 2017-04-28 MED ORDER — MIDAZOLAM HCL 2 MG/2ML IJ SOLN
INTRAMUSCULAR | Status: AC
  Filled 2017-04-28: qty 2

## 2017-04-28 MED ORDER — FLUTICASONE PROPIONATE 50 MCG/ACT NA SUSP
2.0000 | Freq: Every day | NASAL | Status: DC
  Administered 2017-04-29: 2 via NASAL
  Filled 2017-04-28: qty 16

## 2017-04-28 MED ORDER — CYCLOSPORINE 0.05 % OP EMUL
1.0000 [drp] | Freq: Two times a day (BID) | OPHTHALMIC | Status: DC
  Administered 2017-04-29: 1 [drp] via OPHTHALMIC
  Filled 2017-04-28 (×2): qty 1

## 2017-04-28 MED ORDER — GUAIFENESIN-DM 100-10 MG/5ML PO SYRP: 15.0000 mL | ORAL_SOLUTION | ORAL | Status: DC | PRN

## 2017-04-28 MED ORDER — MIDAZOLAM HCL 5 MG/5ML IJ SOLN
INTRAMUSCULAR | Status: DC | PRN
  Administered 2017-04-28: 2 mg via INTRAVENOUS

## 2017-04-28 MED ORDER — ALBUTEROL SULFATE HFA 108 (90 BASE) MCG/ACT IN AERS
INHALATION_SPRAY | RESPIRATORY_TRACT | Status: DC | PRN
  Administered 2017-04-28: 6 via RESPIRATORY_TRACT

## 2017-04-28 MED ORDER — PROMETHAZINE HCL 25 MG/ML IJ SOLN: 6.2500 mg | INTRAMUSCULAR | Status: DC | PRN

## 2017-04-28 MED ORDER — MUPIROCIN 2 % EX OINT
TOPICAL_OINTMENT | CUTANEOUS | Status: AC
  Filled 2017-04-28: qty 22

## 2017-04-28 MED ORDER — LACTATED RINGERS IV SOLN: INTRAVENOUS | Status: DC

## 2017-04-28 MED ORDER — OXYCODONE HCL 5 MG PO TABS
ORAL_TABLET | ORAL | Status: AC
  Administered 2017-04-28: 10 mg via ORAL
  Filled 2017-04-28: qty 2

## 2017-04-28 MED ORDER — ONDANSETRON HCL 4 MG/2ML IJ SOLN
4.0000 mg | Freq: Four times a day (QID) | INTRAMUSCULAR | Status: DC | PRN
  Administered 2017-04-28: 4 mg via INTRAVENOUS
  Filled 2017-04-28: qty 2

## 2017-04-28 MED ORDER — DEXTROSE 5 % IV SOLN
INTRAVENOUS | Status: DC | PRN
  Administered 2017-04-28: 40 ug/min via INTRAVENOUS

## 2017-04-28 MED ORDER — PANTOPRAZOLE SODIUM 40 MG PO TBEC
40.0000 mg | DELAYED_RELEASE_TABLET | Freq: Every day | ORAL | Status: DC
  Administered 2017-04-28 – 2017-04-29 (×2): 40 mg via ORAL
  Filled 2017-04-28 (×2): qty 1

## 2017-04-28 MED ORDER — SUGAMMADEX SODIUM 200 MG/2ML IV SOLN
INTRAVENOUS | Status: DC | PRN
  Administered 2017-04-28: 200 mg via INTRAVENOUS

## 2017-04-28 MED ORDER — ALUM & MAG HYDROXIDE-SIMETH 200-200-20 MG/5ML PO SUSP: 15.0000 mL | ORAL | Status: DC | PRN

## 2017-04-28 MED ORDER — PROPOFOL 10 MG/ML IV BOLUS
INTRAVENOUS | Status: AC
  Filled 2017-04-28: qty 20

## 2017-04-28 MED ORDER — SODIUM CHLORIDE 0.9 % IV SOLN: 500.0000 mL | Freq: Once | INTRAVENOUS | Status: DC | PRN

## 2017-04-28 MED ORDER — HYDROMORPHONE HCL 1 MG/ML IJ SOLN
0.2500 mg | INTRAMUSCULAR | Status: DC | PRN
  Administered 2017-04-28 (×4): 0.5 mg via INTRAVENOUS

## 2017-04-28 MED ORDER — ACETAMINOPHEN 325 MG PO TABS
ORAL_TABLET | ORAL | Status: AC
  Administered 2017-04-28: 650 mg via ORAL
  Filled 2017-04-28: qty 2

## 2017-04-28 MED ORDER — NICOTINE 21 MG/24HR TD PT24
21.0000 mg | MEDICATED_PATCH | Freq: Every day | TRANSDERMAL | Status: DC
  Administered 2017-04-28 – 2017-04-29 (×2): 21 mg via TRANSDERMAL
  Filled 2017-04-28 (×2): qty 1

## 2017-04-28 SURGICAL SUPPLY — 48 items
ADH SKN CLS APL DERMABOND .7 (GAUZE/BANDAGES/DRESSINGS) ×2
AGENT HMST SPONGE THK3/8 (HEMOSTASIS) ×1
CANISTER SUCT 3000ML PPV (MISCELLANEOUS) ×3 IMPLANT
CLIP VESOCCLUDE MED 24/CT (CLIP) ×3 IMPLANT
CLIP VESOCCLUDE SM WIDE 24/CT (CLIP) ×3 IMPLANT
DERMABOND ADVANCED (GAUZE/BANDAGES/DRESSINGS) ×4
DERMABOND ADVANCED .7 DNX12 (GAUZE/BANDAGES/DRESSINGS) IMPLANT
DRAIN CHANNEL 15F RND FF W/TCR (WOUND CARE) IMPLANT
ELECT REM PT RETURN 9FT ADLT (ELECTROSURGICAL) ×3
ELECTRODE REM PT RTRN 9FT ADLT (ELECTROSURGICAL) ×1 IMPLANT
EVACUATOR SILICONE 100CC (DRAIN) IMPLANT
GLOVE BIO SURGEON STRL SZ 6 (GLOVE) ×2 IMPLANT
GLOVE BIO SURGEON STRL SZ 6.5 (GLOVE) ×3 IMPLANT
GLOVE BIO SURGEON STRL SZ7 (GLOVE) ×3 IMPLANT
GLOVE BIO SURGEON STRL SZ7.5 (GLOVE) ×2 IMPLANT
GLOVE BIO SURGEONS STRL SZ 6.5 (GLOVE) ×3
GLOVE BIOGEL PI IND STRL 6.5 (GLOVE) IMPLANT
GLOVE BIOGEL PI IND STRL 7.5 (GLOVE) ×1 IMPLANT
GLOVE BIOGEL PI INDICATOR 6.5 (GLOVE) ×6
GLOVE BIOGEL PI INDICATOR 7.5 (GLOVE) ×6
GLOVE ECLIPSE 6.5 STRL STRAW (GLOVE) ×2 IMPLANT
GLOVE ECLIPSE 7.0 STRL STRAW (GLOVE) ×4 IMPLANT
GOWN STRL REUS W/ TWL LRG LVL3 (GOWN DISPOSABLE) ×3 IMPLANT
GOWN STRL REUS W/ TWL XL LVL3 (GOWN DISPOSABLE) IMPLANT
GOWN STRL REUS W/TWL LRG LVL3 (GOWN DISPOSABLE) ×18
GOWN STRL REUS W/TWL XL LVL3 (GOWN DISPOSABLE) ×3
HEMOSTAT SPONGE AVITENE ULTRA (HEMOSTASIS) ×2 IMPLANT
KIT BASIN OR (CUSTOM PROCEDURE TRAY) ×3 IMPLANT
KIT ROOM TURNOVER OR (KITS) ×3 IMPLANT
NS IRRIG 1000ML POUR BTL (IV SOLUTION) ×6 IMPLANT
PACK PERIPHERAL VASCULAR (CUSTOM PROCEDURE TRAY) ×3 IMPLANT
PAD ARMBOARD 7.5X6 YLW CONV (MISCELLANEOUS) ×6 IMPLANT
PATCH VASC XENOSURE 1CMX6CM (Vascular Products) ×3 IMPLANT
PATCH VASC XENOSURE 1X6 (Vascular Products) IMPLANT
SPONGE INTESTINAL PEANUT (DISPOSABLE) ×2 IMPLANT
SUT MNCRL AB 4-0 PS2 18 (SUTURE) ×3 IMPLANT
SUT PROLENE 5 0 C 1 24 (SUTURE) ×3 IMPLANT
SUT PROLENE 6 0 BV (SUTURE) ×4 IMPLANT
SUT PROLENE 7 0 BV 1 (SUTURE) ×2 IMPLANT
SUT PROLENE 7 0 BV1 MDA (SUTURE) ×2 IMPLANT
SUT VIC AB 2-0 CT1 27 (SUTURE) ×3
SUT VIC AB 2-0 CT1 TAPERPNT 27 (SUTURE) ×1 IMPLANT
SUT VIC AB 3-0 SH 27 (SUTURE) ×3
SUT VIC AB 3-0 SH 27X BRD (SUTURE) ×1 IMPLANT
TOWEL GREEN STERILE (TOWEL DISPOSABLE) ×5 IMPLANT
TRAY FOLEY W/METER SILVER 16FR (SET/KITS/TRAYS/PACK) IMPLANT
UNDERPAD 30X30 (UNDERPADS AND DIAPERS) ×3 IMPLANT
WATER STERILE IRR 1000ML POUR (IV SOLUTION) ×3 IMPLANT

## 2017-04-28 NOTE — Interval H&P Note (Signed)
   History and Physical Update  The patient was interviewed and re-examined.  The patient's previous History and Physical has been reviewed and is unchanged from my NP's consult except for: interval angiogram.  This angiogram demonstrate hemodynamically significant stenosis >75% in L CFA proximal to the fem-AT bypass.  The plan is left iliofemoral endarterectomy with bovine patch angioplasty, revision of the proximal bypass, and other indicated procedures.   The risk, benefits, and alternative for this procedure were discussed with the patient.    The patient is aware the risks include but are not limited to: bleeding, infection, myocardial infarction, stroke, limb loss, nerve damage, limb edema, need for additional procedures in the future, wound complications, and inability to complete the endarterectomy.   The patient is aware of these risks and agreed to proceed.   Leonides SakeBrian Martell Mcfadyen, MD, FACS [facility] [place] Office: [phone] Pager: [phone]  04/28/2017, 6:55 AM

## 2017-04-28 NOTE — Anesthesia Procedure Notes (Signed)
Procedure Name: Intubation Date/Time: 04/28/2017 7:47 AM Performed by: Aleesa Sweigert T, CRNA Pre-anesthesia Checklist: Patient identified, Emergency Drugs available, Suction available and Patient being monitored Patient Re-evaluated:Patient Re-evaluated prior to induction Oxygen Delivery Method: Circle system utilized Preoxygenation: Pre-oxygenation with 100% oxygen Induction Type: IV induction Ventilation: Mask ventilation without difficulty Laryngoscope Size: Mac and 4 Grade View: Grade II Tube type: Oral Tube size: 7.5 mm Number of attempts: 1 Airway Equipment and Method: Patient positioned with wedge pillow and Stylet Placement Confirmation: ETT inserted through vocal cords under direct vision,  positive ETCO2 and breath sounds checked- equal and bilateral Secured at: 22 cm Tube secured with: Tape Dental Injury: Teeth and Oropharynx as per pre-operative assessment

## 2017-04-28 NOTE — Evaluation (Signed)
Physical Therapy Evaluation Patient Details Name: Brady Bell MRN: [id number] DOB: 12/03/1955 Today's Date: 04/28/2017   History of Present Illness  Pt is a 62 y.o. male now s/p L ilio-femoral endarterectomy and patch angioplasty 04/28/17. PMH includes tobacco abuse, Hep A, GERD, PVD, seizures, chronic knee pain.    Clinical Impression  Patient evaluated by Physical Therapy with no further acute PT needs identified. Pt currently mod indep with SPC. All education has been completed and the patient has no further questions. PT is signing off. Thank you for this referral.    Follow Up Recommendations No PT follow up    Equipment Recommendations  None recommended by PT    Recommendations for Other Services       Precautions / Restrictions Precautions Precautions: None Restrictions Weight Bearing Restrictions: No      Mobility  Bed Mobility Overal bed mobility: Independent                Transfers Overall transfer level: Modified independent Equipment used: Straight cane                Ambulation/Gait Ambulation/Gait assistance: Modified independent (Device/Increase time) Ambulation Distance (Feet): 80 Feet Assistive device: Straight cane Gait Pattern/deviations: Step-through pattern;Decreased stride length;Antalgic Gait velocity: Decreased Gait velocity interpretation: <1.8 ft/sec, indicative of risk for recurrent falls General Gait Details: Slow, antalgic amb with SPC; mod indep. Distance limited secondary to incisional site/groin discomfort  Stairs            Wheelchair Mobility    Modified Rankin (Stroke Patients Only)       Balance Overall balance assessment: Modified Independent                                           Pertinent Vitals/Pain Pain Assessment: Faces Faces Pain Scale: Hurts a little bit Pain Location: Incision site Pain Descriptors / Indicators: Sore Pain Intervention(s): Monitored during  session;Limited activity within patient's tolerance    Home Living Family/patient expects to be discharged to:: Private residence Living Arrangements: Alone Available Help at Discharge: Friend(s);Available PRN/intermittently Type of Home: Apartment Home Access: Level entry     Home Layout: One level Home Equipment: Walker - 2 wheels;Cane - single point Additional Comments: Handicapped-accessible 1st floor apartment    Prior Function Level of Independence: Independent with assistive device(s)         Comments: Mod indep with SPC     Hand Dominance        Extremity/Trunk Assessment   Upper Extremity Assessment Upper Extremity Assessment: Overall WFL for tasks assessed    Lower Extremity Assessment Lower Extremity Assessment: Overall WFL for tasks assessed       Communication   Communication: No difficulties  Cognition Arousal/Alertness: Awake/alert Behavior During Therapy: WFL for tasks assessed/performed Overall Cognitive Status: Within Functional Limits for tasks assessed                                        General Comments      Exercises     Assessment/Plan    PT Assessment Patent does not need any further PT services  PT Problem List         PT Treatment Interventions      PT Goals (Current goals can be found in the Care  Plan section)  Acute Rehab PT Goals PT Goal Formulation: All assessment and education complete, DC therapy    Frequency     Barriers to discharge        Co-evaluation               AM-PAC PT "6 Clicks" Daily Activity  Outcome Measure Difficulty turning over in bed (including adjusting bedclothes, sheets and blankets)?: None Difficulty moving from lying on back to sitting on the side of the bed? : None Difficulty sitting down on and standing up from a chair with arms (e.g., wheelchair, bedside commode, etc,.)?: None Help needed moving to and from a bed to chair (including a wheelchair)?:  None Help needed walking in hospital room?: None Help needed climbing 3-5 steps with a railing? : None 6 Click Score: 24    End of Session   Activity Tolerance: Patient tolerated treatment well Patient left: in chair;with call bell/phone within reach;Other (comment)(with PA present) Nurse Communication: Mobility status PT Visit Diagnosis: Other abnormalities of gait and mobility (R26.89)    Time: 5621-3086 PT Time Calculation (min) (ACUTE ONLY): 14 min   Charges:   PT Evaluation $PT Eval Low Complexity: 1 Low     PT G Codes:       Ina Homes, PT, DPT Acute Rehab Services  Pager: [phone]  Malachy Chamber 04/28/2017, 2:57 PM

## 2017-04-28 NOTE — Op Note (Signed)
OPERATIVE NOTE   PROCEDURE: 1. 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 Lemar Livings, Lianne Cure, [facility]   ANESTHESIA: general  ESTIMATED BLOOD LOSS: 50 cc  FINDING(S): 1.  Necrotic core plaque occupying most of common femoral artery, extending from femoral bifurcation up to distal external iliac artery  2.  Widely patent vein bypass 3.  Palpable distal pulse in graft at end of case case  SPECIMEN(S):  none  INDICATIONS:   Brady Bell is a 62 y.o. 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  I recommended: left iliofemoral endarterectomy with bovine patch angioplasty and possible revision of bypass.  The risk, benefits, and alternative for proposed operation were discussed with the patient.  The patient is aware the risks include but are not limited to: bleeding, infection, myocardial infarction, stroke, limb loss, nerve damage, limb edema, need for additional procedures in the future, wound complications, and inability to complete the endarterectomy.  The patient is aware of these risks and agreed to proceed.   DESCRIPTION: After obtaining full informed written consent, the patient was brought back to the operating room and placed supine upon the operating table.  The patient received IV antibiotics prior to induction.  A procedure time out was completed and the correct surgical site was verified.  After obtaining adequate anesthesia, the patient was prepped and draped in the standard fashion for: left femoral endarterectomy.  I made an incision over the prior left femoral incision.  I dissected down to the common femoral artery with electrocautery.  I carefully avoid the bypass  graft, dissecting out the proximal common femoral artery and distal external iliac artery first.  I then dissected distally to uncover the bypass graft.  I was able to fully dissect out the common femoral artery, superficial femoral artery and profunda femoral artery.  I then dissected out the proximal bypass vein graft.  I placed vessel loops to control the vein graft and femoral arteries.  I then dissected out a soft area in the distal external iliac artery.  I placed vessel loops around the circumflex branches.  I ligated the crossing veins to get exposure of the soft area of the distal external iliac artery.    The patient was given 7000 units of Heparin intravenously, which was a therapeutic bolus. An additional 2000 units of Heparin was administered every hour after initial bolus to maintain anticoagulation.  In total, 9000 units of Heparin was administrated to achieve and maintain a therapeutic level of anticoagulation.  After waiting 3 minutes, I clamped the distal external iliac artery and then placed the vein graft and profunda femoral artery branches under tension.  I made an arteriotomy in the anterior wall of the common femoral artery with a 11-blade and then extended proximally and distally.  Immediately the necrotic core of the plaque was evident.  I dissected out the plaque until I had access to the lumen.  This plaque was was found to occupy most of the lumen of this common femoral artery.  I carried the endarterectomy into the distal external iliac artery first.  The plaque feathered in the distal external iliac artery.  I sharply removed this half of the plaque and passed it off the field.  I sharply open  the distal common femoral artery to the level of the anastomosis of the vein graft.  After sharply taking down the anterior wall of the vein graft from the common femoral artery, I could see that the plaque appeared to most terminate at the distal femoral bifurcation.  I was able to sharply  terminated the plaque just proximal to the bifurcation.  I completed the endarterectomy at this level.  I cleaned out the common femoral artery by dissecting residual intimal flaps from the the artery.  I tacked the residual disease against the wall with 7-0 Prolene stitches.    At this point, I took a prepared bovine pericardial patch and sewed the patch to the iliofemoral segment and vein graft, tying each end to the loose ends of the prior bypass suture line in the process.  Effectively, I patched the anterior wall of the vein graft.  Prior to completion, I backbled all vessels: no thrombus was present.  I washed out the common femoral artery prior to completion of the anastomosis.  I removed all vessel loops and clamps.  Immediately, there was a pulse in the vein graft.  The doppler waveform were appropriate for the graft.    I repaired two bleeding points in the suture line with 6-0 Prolene sutures.  I gave 50 mg of Protamine to reverse anticoagulation.  I packed the wound with Avitene and waited a few minutes.  After a couple of rounds of this, no further active bleeding was present.  This left groin was repaired with a double layer of 2-0 Vicryl, 3-0 Vicryl and finally a running subcuticular stitch of 4-0 Monocryl at the skin layer.  The skin was cleaned, dried, and reinforced with Dermabond.  Distally, I checked the vein bypass and there was strong palpable pulse.   COMPLICATIONS: none  CONDITION: stable   Leonides SakeBrian Jamyia Fortune, MD, [facility] [place] Office: [phone] Pager: [phone]  04/28/2017, 10:15 AM

## 2017-04-28 NOTE — Transfer of Care (Signed)
Immediate Anesthesia Transfer of Care Note  Patient: Brady Bell  Procedure(s) Performed: ENDARTERECTOMY LEFT ILIO-FEMORAL ARTERY (Left Groin) ILIO-FEMORAL ARTERY PATCH ANGIOPLASTY USING XENOSURE BIOLOGIC PATCH (Left Groin)  Patient Location: PACU  Anesthesia Type:General  Level of Consciousness: awake, alert  and oriented  Airway & Oxygen Therapy: Patient Spontanous Breathing and Patient connected to nasal cannula oxygen  Post-op Assessment: Report given to RN, Post -op Vital signs reviewed and stable and Patient moving all extremities  Post vital signs: Reviewed and stable  Last Vitals:  Vitals:   04/28/17 0623 04/28/17 1030  BP: (!) 139/91   Pulse: 72   Resp: 20 18  Temp: 36.7 C   SpO2: 100%     Last Pain:  Vitals:   04/28/17 0623  TempSrc: Oral  PainSc:       Patients Stated Pain Goal: 3 (04/28/17 0618)  Complications: No apparent anesthesia complications

## 2017-04-28 NOTE — Anesthesia Postprocedure Evaluation (Signed)
Anesthesia Post Note  Patient: Brady Bell  Procedure(s) Performed: ENDARTERECTOMY LEFT ILIO-FEMORAL ARTERY (Left Groin) ILIO-FEMORAL ARTERY PATCH ANGIOPLASTY USING XENOSURE BIOLOGIC PATCH (Left Groin)     Patient location during evaluation: PACU Anesthesia Type: General Level of consciousness: awake and alert Pain management: pain level controlled Vital Signs Assessment: post-procedure vital signs reviewed and stable Respiratory status: spontaneous breathing, nonlabored ventilation, respiratory function stable and patient connected to nasal cannula oxygen Cardiovascular status: blood pressure returned to baseline and stable Postop Assessment: no apparent nausea or vomiting Anesthetic complications: no    Last Vitals:  Vitals:   04/28/17 1230 04/28/17 1338  BP: 111/77 123/88  Pulse: 68 66  Resp: 12 18  Temp:  36.7 C  SpO2: 99% 93%    Last Pain:  Vitals:   04/28/17 1338  TempSrc: Oral  PainSc:                  Shelton SilvasKevin D Teodora Baumgarten

## 2017-04-28 NOTE — Progress Notes (Signed)
  Progress Note    04/28/2017 2:48 PM Day of Surgery  Subjective:  Pain at L groin however denies rest pain L foot   Vitals:   04/28/17 1230 04/28/17 1338  BP: 111/77 123/88  Pulse: 68 66  Resp: 12 18  Temp:  98 F (36.7 C)  SpO2: 99% 93%   Physical Exam: Lungs:  Non labored Incisions:  L groin incision without hematoma; skin edges approximated well Extremities:  Palpable L DP Abdomen:  Soft Neurologic: A&O  CBC    Component Value Date/Time   WBC 7.7 04/28/2017 0635   RBC 4.49 04/28/2017 0635   HGB 15.6 04/28/2017 0635   HGB 20.2 (HH) 12/08/2016 1049   HCT 45.5 04/28/2017 0635   HCT 58.3 (H) 12/08/2016 1049   PLT 175 04/28/2017 0635   PLT 168 12/08/2016 1049   MCV 101.3 (H) 04/28/2017 0635   MCV 101 (H) 12/08/2016 1049   MCH 34.7 (H) 04/28/2017 0635   MCHC 34.3 04/28/2017 0635   RDW 13.3 04/28/2017 0635   RDW 14.2 12/08/2016 1049   LYMPHSABS 0.9 04/02/2017 1307   MONOABS 1.3 (H) 04/02/2017 1307   EOSABS 0.3 04/02/2017 1307   BASOSABS 0.1 04/02/2017 1307    BMET    Component Value Date/Time   NA 136 04/28/2017 0635   NA 135 12/08/2016 1049   K 3.8 04/28/2017 0635   CL 102 04/28/2017 0635   CO2 22 04/28/2017 0635   GLUCOSE 100 (H) 04/28/2017 0635   BUN 9 04/28/2017 0635   BUN 12 12/08/2016 1049   CREATININE 1.10 04/28/2017 0635   CALCIUM 8.7 (L) 04/28/2017 0635   GFRNONAA >60 04/28/2017 0635   GFRAA >60 04/28/2017 0635    INR    Component Value Date/Time   INR 1.09 04/28/2017 0635     Intake/Output Summary (Last 24 hours) at 04/28/2017 1448 Last data filed at 04/28/2017 1300 Gross per 24 hour  Intake 1800 ml  Output 100 ml  Net 1700 ml     Assessment/Plan:  62 y.o. male is s/p L iliofemoral endarterectomy with bovine patch angioplasty Day of Surgery   Nicotine patch ordered Encouraged OOB Pain control overnight If no events overnight, possible d/c tomorrow   Emilie RutterMatthew Greggory Safranek, PA-C [facility] [phone] 04/28/2017 2:48 PM

## 2017-04-28 NOTE — Progress Notes (Signed)
Arrived from PACU in stable condition. VSS. Tele placed; CCMD notified. Oriented to unit and staff with understanding verbalized. Orders reviewed. Assessment as documented. Will continue to monitor.

## 2017-04-29 ENCOUNTER — Encounter (HOSPITAL_COMMUNITY): Payer: Self-pay | Admitting: Vascular Surgery

## 2017-04-29 ENCOUNTER — Telehealth: Payer: Self-pay | Admitting: Vascular Surgery

## 2017-04-29 LAB — CBC
HEMATOCRIT: 38.5 % — AB (ref 39.0–52.0)
Hemoglobin: 12.7 g/dL — ABNORMAL LOW (ref 13.0–17.0)
MCH: 34.1 pg — ABNORMAL HIGH (ref 26.0–34.0)
MCHC: 33 g/dL (ref 30.0–36.0)
MCV: 103.5 fL — ABNORMAL HIGH (ref 78.0–100.0)
PLATELETS: 136 10*3/uL — AB (ref 150–400)
RBC: 3.72 MIL/uL — ABNORMAL LOW (ref 4.22–5.81)
RDW: 13.1 % (ref 11.5–15.5)
WBC: 6.8 10*3/uL (ref 4.0–10.5)

## 2017-04-29 LAB — BASIC METABOLIC PANEL
ANION GAP: 10 (ref 5–15)
BUN: 5 mg/dL — ABNORMAL LOW (ref 6–20)
CALCIUM: 8.3 mg/dL — AB (ref 8.9–10.3)
CO2: 24 mmol/L (ref 22–32)
CREATININE: 0.8 mg/dL (ref 0.61–1.24)
Chloride: 100 mmol/L — ABNORMAL LOW (ref 101–111)
GFR calc Af Amer: >60 mL/min (ref >60)
Glucose, Bld: 96 mg/dL (ref 65–99)
Potassium: 3.9 mmol/L (ref 3.5–5.1)
SODIUM: 134 mmol/L — AB (ref 135–145)

## 2017-04-29 MED ORDER — OXYCODONE HCL 5 MG PO TABS: 5.0000 mg | ORAL_TABLET | ORAL | 0 refills | Status: DC | PRN

## 2017-04-29 NOTE — Evaluation (Signed)
Occupational Therapy Evaluation and Discharge Patient Details Name: Brady FabryKenneth L Bell MRN: [id number] DOB: 04/27/1955 Today's Date: 04/29/2017    History of Present Illness Pt is a 62 y.o. male now s/p L ilio-femoral endarterectomy and patch angioplasty 04/28/17. PMH includes tobacco abuse, Hep A, GERD, PVD, seizures, chronic knee pain.   Clinical Impression   PTA Pt mod independent with SPC, and independent in ADL. Pt is currently at baseline with the exception of min A to don socks and shoes due to mild pain at incision site. Pt has already lined up a friend/former aide to come assist him with these tasks, and anticipate that once the pain subsides he will make a quick recovery and once again be independent. No questions or concerns for OT at the end of the session. OT education complete. OT to sign off. Thank you for the opportunity to serve this patient.     Follow Up Recommendations  No OT follow up    Equipment Recommendations  None recommended by OT    Recommendations for Other Services       Precautions / Restrictions Precautions Precautions: None Restrictions Weight Bearing Restrictions: No      Mobility Bed Mobility               General bed mobility comments: Pt up out of bed when OT entered the room  Transfers Overall transfer level: Modified independent Equipment used: Straight cane                  Balance Overall balance assessment: Modified Independent                                         ADL either performed or assessed with clinical judgement   ADL                                         General ADL Comments: Pt is mod I with the exception of donning socks and shoes. OT discussed this with Pt and he has already lined up a friend/aide to come and assist him with this in the mornings. Pt is only limited due to short term pain and anticipate quick recovery of full ROM and return to independence     Vision  Baseline Vision/History: Wears glasses Wears Glasses: At all times Patient Visual Report: No change from baseline       Perception     Praxis      Pertinent Vitals/Pain Pain Assessment: Faces Faces Pain Scale: Hurts little more Pain Location: Incision site Pain Descriptors / Indicators: Sore;Tender Pain Intervention(s): Monitored during session     Hand Dominance Right   Extremity/Trunk Assessment Upper Extremity Assessment Upper Extremity Assessment: Overall WFL for tasks assessed   Lower Extremity Assessment Lower Extremity Assessment: Overall WFL for tasks assessed   Cervical / Trunk Assessment Cervical / Trunk Assessment: Normal   Communication Communication Communication: No difficulties   Cognition Arousal/Alertness: Awake/alert Behavior During Therapy: WFL for tasks assessed/performed Overall Cognitive Status: Within Functional Limits for tasks assessed                                     General Comments       Exercises  Shoulder Instructions      Home Living Family/patient expects to be discharged to:: Private residence Living Arrangements: Alone Available Help at Discharge: Friend(s);Available PRN/intermittently Type of Home: Apartment Home Access: Level entry     Home Layout: One level     Bathroom Shower/Tub: Chief Strategy Officer: Standard Bathroom Accessibility: Yes How Accessible: Accessible via wheelchair Home Equipment: Walker - 2 wheels;Cane - single point;Grab bars - toilet;Grab bars - tub/shower   Additional Comments: Handicapped-accessible 1st floor apartment      Prior Functioning/Environment Level of Independence: Independent with assistive device(s)        Comments: Mod indep with SPC        OT Problem List: Decreased range of motion;Pain      OT Treatment/Interventions:      OT Goals(Current goals can be found in the care plan section) Acute Rehab OT Goals Patient Stated Goal: to  get home OT Goal Formulation: With patient Time For Goal Achievement: 05/06/17 Potential to Achieve Goals: Good  OT Frequency:     Barriers to D/C:            Co-evaluation              AM-PAC PT "6 Clicks" Daily Activity     Outcome Measure Help from another person eating meals?: None Help from another person taking care of personal grooming?: None Help from another person toileting, which includes using toliet, bedpan, or urinal?: None Help from another person bathing (including washing, rinsing, drying)?: None Help from another person to put on and taking off regular upper body clothing?: None Help from another person to put on and taking off regular lower body clothing?: A Little 6 Click Score: 23   End of Session Nurse Communication: Mobility status  Activity Tolerance: Patient tolerated treatment well Patient left: Other (comment)(packing in room)  OT Visit Diagnosis: Pain Pain - Right/Left: Left Pain - part of body: Leg                Time: 1610-9604 OT Time Calculation (min): 17 min Charges:  OT General Charges $OT Visit: 1 Visit OT Evaluation $OT Eval Low Complexity: 1 Low G-Codes:    Sherryl Manges OTR/L [phone]  Brady Bell 04/29/2017, 9:09 AM

## 2017-04-29 NOTE — Progress Notes (Signed)
   Daily Progress Note   Assessment/Planning:   POD #1 s/p L iliofemoral EA w/ BPA   Ambulate  Home if pain control adequate  I again emphasized need for smoking cessation   Subjective  - 1 Day Post-Op   C/o back pain from laying in bed   Objective   Vitals:   04/28/17 1600 04/28/17 1700 04/28/17 2019 04/28/17 2320  BP:   (!) 140/100 (!) 139/92  Pulse:   65 60  Resp: 17 (!) 21 14 16   Temp:   97.6 F (36.4 C) (!) 97.5 F (36.4 C)  TempSrc:   Oral Oral  SpO2:      Weight:      Height:         Intake/Output Summary (Last 24 hours) at 04/29/2017 0705 Last data filed at 04/28/2017 1957 Gross per 24 hour  Intake 2920 ml  Output 300 ml  Net 2620 ml    PULM  CTAB  CV  RRR  GI  soft, NTND  VASC L groin inc c/d/i, somewhat echymotic, no hematoma palpable, strong graft pulse and DP  NEURO A&O x 3    Laboratory   CBC CBC Latest Ref Rng & Units 04/29/2017 04/28/2017 04/23/2017  WBC 4.0 - 10.5 K/uL 6.8 7.7 -  Hemoglobin 13.0 - 17.0 g/dL 12.7(L) 15.6 17.0  Hematocrit 39.0 - 52.0 % 38.5(L) 45.5 50.0  Platelets 150 - 400 K/uL 136(L) 175 -    BMET    Component Value Date/Time   NA 134 (L) 04/29/2017 0322   NA 135 12/08/2016 1049   K 3.9 04/29/2017 0322   CL 100 (L) 04/29/2017 0322   CO2 24 04/29/2017 0322   GLUCOSE 96 04/29/2017 0322   BUN 5 (L) 04/29/2017 0322   BUN 12 12/08/2016 1049   CREATININE 0.80 04/29/2017 0322   CALCIUM 8.3 (L) 04/29/2017 0322   GFRNONAA >60 04/29/2017 0322   GFRAA >60 04/29/2017 0322     Leonides SakeBrian Aspyn Warnke, MD, FACS [facility] [place] Office: [phone] Pager: [phone]  04/29/2017, 7:05 AM

## 2017-04-29 NOTE — Discharge Instructions (Signed)
 [facility] [place]  Discharge instructions  Lower Extremity Bypass Surgery  Please refer to the following instruction for your post-procedure care. Your surgeon or physician assistant will discuss any changes with you.  Activity  You are encouraged to walk as much as you can. You can slowly return to normal activities during the month after your surgery. Avoid strenuous activity and heavy lifting until your doctor tells you it's OK. Avoid activities such as vacuuming or swinging a golf club. Do not drive until your doctor give the OK and you are no longer taking prescription pain medications. It is also normal to have difficulty with sleep habits, eating and bowel movement after surgery. These will go away with time.  Bathing/Showering  You may shower after you go home. Do not soak in a bathtub, hot tub, or swim until the incision heals completely.  Incision Care  Clean your incision with mild soap and water. Shower every day. Pat the area dry with a clean towel. You do not need a bandage unless otherwise instructed. Do not apply any ointments or creams to your incision. If you have open wounds you will be instructed how to care for them or a visiting nurse may be arranged for you. If you have staples or sutures along your incision they will be removed at your post-op appointment. You may have skin glue on your incision. Do not peel it off. It will come off on its own in about one week. If you have a great deal of moisture in your groin, use a gauze help keep this area dry.  Diet  Resume your normal diet. There are no special food restrictions following this procedure. A low fat/ low cholesterol diet is recommended for all patients with vascular disease. In order to heal from your surgery, it is CRITICAL to get adequate nutrition. Your body requires vitamins, minerals, and protein. Vegetables are the best source of vitamins and minerals. Vegetables also provide the  perfect balance of protein. Processed food has little nutritional value, so try to avoid this.  Medications  Resume taking all your medications unless your doctor or nurse practitioner tells you not to. If your incision is causing pain, you may take over-the-counter pain relievers such as acetaminophen (Tylenol). If you were prescribed a stronger pain medication, please aware these medication can cause nausea and constipation. Prevent nausea by taking the medication with a snack or meal. Avoid constipation by drinking plenty of fluids and eating foods with high amount of fiber, such as fruits, vegetables, and grains. Take Colase 100 mg (an over-the-counter stool softener) twice a day as needed for constipation. Do not take Tylenol if you are taking prescription pain medications.  Follow Up  Our office will schedule a follow up appointment 2-3 weeks following discharge.  Please call us immediately for any of the following conditions  Severe or worsening pain in your legs or feet while at rest or while walking Increase pain, redness, warmth, or drainage (pus) from your incision site(s) Fever of 101 degree or higher The swelling in your leg with the bypass suddenly worsens and becomes more painful than when you were in the hospital If you have been instructed to feel your graft pulse then you should do so every day. If you can no longer feel this pulse, call the office immediately. Not all patients are given this instruction.  Leg swelling is common after leg bypass surgery.  The swelling should improve over a few months   following surgery. To improve the swelling, you may elevate your legs above the level of your heart while you are sitting or resting. Your surgeon or physician assistant may ask you to apply an ACE wrap or wear compression (TED) stockings to help to reduce swelling.  Reduce your risk of vascular disease  Stop smoking. If you would like help call [facility] at 1-800-QUIT-NOW  (1-800-784-8669) or [place] at 336-586-4000.  Manage your cholesterol Maintain a desired weight Control your diabetes weight Control your diabetes Keep your blood pressure down  If you have any questions, please call the office at [phone]   

## 2017-04-29 NOTE — Telephone Encounter (Signed)
Sched lab 05/14/17 at 4:00 and MD 05/20/17 at 12:45. Spoke to pt to inform them of appts.

## 2017-04-29 NOTE — Discharge Summary (Signed)
[facility] Discharge Summary   Patient ID:  Brady Bell MRN: [id number] DOB/AGE: 1955/11/03 62 y.o.  Admit date: 04/28/2017 Discharge date: 04/29/2017 Date of Surgery: 04/28/2017 Surgeon: Surgeon(s): Fransisco Hertz, MD Maeola Harman, MD  Admission Diagnosis: femoral occlusion  Discharge Diagnoses:  femoral occlusion  Secondary Diagnoses: Past Medical History:  Diagnosis Date  . Chronic ankle pain   . Chronic knee pain   . GERD (gastroesophageal reflux disease)   . Hepatitis    Hep A in 1980's  . Hypoglycemia   . Migraines   . Peripheral vascular disease (HCC)   . Recurrent sinus infections   . Seizures (HCC)   . Shingles     Procedure(s): ENDARTERECTOMY LEFT ILIO-FEMORAL ARTERY ILIO-FEMORAL ARTERY PATCH ANGIOPLASTY USING XENOSURE BIOLOGIC PATCH  Discharged Condition: good  HPI: 62 y/o male who is s/p left common femoral artery to anterior tibial artery bypass with non-reversed ipslateral greater saphenous vein (Date: 09/06/11). Who on routine f/u ABI's reveled elevated velocities  Of 300 cm/s indicating CFA stenosis above the by pass anastomosis.    Angiogram was performed by Dr. Imogene Burn which confirmed left iliofemoral stenosis.       Hospital Course:  Brady Bell is a 62 y.o. male is S/P  Procedure(s): ENDARTERECTOMY LEFT ILIO-FEMORAL ARTERY ILIO-FEMORAL ARTERY PATCH ANGIOPLASTY USING XENOSURE BIOLOGIC PATCH  Post op exam with palpable left DP, groin is soft without hematoma.  He has ambulated and pain is well controlled with PO pain medication.  He will be discharged home in stable condition.  ABI's will be scheduled at his next post op office visit.    Significant Diagnostic Studies: CBC Lab Results  Component Value Date   WBC 6.8 04/29/2017   HGB 12.7 (L) 04/29/2017   HCT 38.5 (L) 04/29/2017   MCV 103.5 (H) 04/29/2017   PLT 136 (L) 04/29/2017    BMET    Component Value Date/Time   NA 134 (L) 04/29/2017 0322   NA  135 12/08/2016 1049   K 3.9 04/29/2017 0322   CL 100 (L) 04/29/2017 0322   CO2 24 04/29/2017 0322   GLUCOSE 96 04/29/2017 0322   BUN 5 (L) 04/29/2017 0322   BUN 12 12/08/2016 1049   CREATININE 0.80 04/29/2017 0322   CALCIUM 8.3 (L) 04/29/2017 0322   GFRNONAA >60 04/29/2017 0322   GFRAA >60 04/29/2017 0322   COAG Lab Results  Component Value Date   INR 1.09 04/28/2017   INR 0.94 09/04/2011     Disposition:  Discharge to :Home Discharge Instructions    Call MD for:  redness, tenderness, or signs of infection (pain, swelling, bleeding, redness, odor or green/yellow discharge around incision site)   Complete by:  As directed    Call MD for:  severe or increased pain, loss or decreased feeling  in affected limb(s)   Complete by:  As directed    Call MD for:  temperature >100.5   Complete by:  As directed    Discharge instructions   Complete by:  As directed    You may shower in 24 hours, no heavy lifting for 2-3 weeks   Resume previous diet   Complete by:  As directed      Allergies as of 04/29/2017   No Known Allergies     Medication List    TAKE these medications   aspirin EC 81 MG tablet Take 81 mg by mouth daily.   cycloSPORINE 0.05 % ophthalmic emulsion Commonly known as:  RESTASIS Place 1 drop into both eyes 2 (two) times daily.   divalproex 500 MG 24 hr tablet Commonly known as:  DEPAKOTE ER Take 1 tablet (500 mg total) at bedtime by mouth.   eletriptan 40 MG tablet Commonly known as:  RELPAX Take 40 mg by mouth every 2 (two) hours as needed for migraine or headache. May repeat in 2 hours if headache persists or recurs.   fluticasone 50 MCG/ACT nasal spray Commonly known as:  FLONASE Place 2 sprays into both nostrils daily.   gabapentin 300 MG capsule Commonly known as:  NEURONTIN Take 300 mg by mouth daily.   multivitamin with minerals Tabs tablet Take 1 tablet by mouth daily.   naproxen sodium 220 MG tablet Commonly known as:  ALEVE Take 220  mg by mouth 2 (two) times daily as needed (takes 1 tablet every morning and again later if needed for pain). Takes 1 tablet every morning   ondansetron 4 MG disintegrating tablet Commonly known as:  ZOFRAN ODT Take 1 tablet (4 mg total) by mouth every 8 (eight) hours as needed. What changed:  reasons to take this   oxyCODONE 5 MG immediate release tablet Commonly known as:  Oxy IR/ROXICODONE Take 1 tablet (5 mg total) by mouth every 4 (four) hours as needed for moderate pain.   SUMAtriptan 6 MG/0.5ML Soaj Commonly known as:  IMITREX STATDOSE SYSTEM Inject 6 mg into the skin as needed.   SUMAtriptan 100 MG tablet Commonly known as:  IMITREX Take 1 tablet (100 mg total) by mouth every 2 (two) hours as needed for migraine. May repeat in 2 hours if headache persists or recurs.      Verbal and written Discharge instructions given to the patient. Wound care per Discharge AVS Follow-up Information    Fransisco Hertzhen, Brena Windsor L, MD Follow up in 2 week(s).   Specialties:  Vascular Surgery, Cardiology Why:  office will call Contact information: [address] [place] [address] [phone]           Signed: Mosetta Pigeonmma Maureen Collins 04/29/2017, 10:01 AM   Addendum  Brady Bell is a 62 y.o. 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  He underwent an uneventful left iliofemoral endarterectomy with bovine patch angioplasty.  His post-operative course was unremarkable and the patient was discharged on POD #1.  He will follow up in the office in 2 weeks for wound check.  Brady SakeBrian Danni Shima, MD, FACS [facility] [place] Office: [phone] Pager: [phone]  04/29/2017, 10:19 AM

## 2017-04-29 NOTE — Telephone Encounter (Signed)
-----   Message from Sharee PimpleMarilyn K McChesney, RN sent at 04/29/2017  9:08 AM EST ----- Regarding: 2-3 weeks w/ ABIs   ----- Message ----- From: Lars Mageollins, Emma M, PA-C Sent: 04/29/2017   7:20 AM To: [facility] Charge Pool  F/U with Dr. Imogene Burnhen in 2-3 weeks s/p left femoral iliac patch angioplasty ABI's B LE

## 2017-04-29 NOTE — Progress Notes (Signed)
[facility] [place]  Subjective  - Doing well, mild pain at incision.   Objective (!) 139/92 60 (!) 97.5 F (36.4 C) (Oral) 16 96%  Intake/Output Summary (Last 24 hours) at 04/29/2017 0708 Last data filed at 04/28/2017 [id number] Gross per 24 hour  Intake 2920 ml  Output 300 ml  Net 2620 ml    Palpable DP2+ Left groin soft with minimal ecchymosis, without hematoma Abdomin soft Heart RRR Lungs non labored breathing on RA Gen NAD  Assessment/Planning: POD # 1 Left iliofemoral endarterectomy with bovine patch angioplasty His bypass from previous fem-pop is patent post op with palpable DP pulse. We discussed his need to stop smoking to help keep the by pass patent.  He will try.   F/U with Dr. Imogene Burnhen in 2-3 weeks with ABI's Discharge home in stable condiotn.        Mosetta Pigeonmma Maureen Yeudiel Mateo 04/29/2017 7:08 AM --  Laboratory Lab Results: Recent Labs    04/28/17 0635 04/29/17 0322  WBC 7.7 6.8  HGB 15.6 12.7*  HCT 45.5 38.5*  PLT 175 136*   BMET Recent Labs    04/28/17 0635 04/29/17 0322  NA 136 134*  K 3.8 3.9  CL 102 100*  CO2 22 24  GLUCOSE 100* 96  BUN 9 5*  CREATININE 1.10 0.80  CALCIUM 8.7* 8.3*    COAG Lab Results  Component Value Date   INR 1.09 04/28/2017   INR 0.94 09/04/2011   No results found for: PTT

## 2017-04-30 ENCOUNTER — Other Ambulatory Visit: Payer: Self-pay

## 2017-05-11 ENCOUNTER — Encounter: Payer: Self-pay | Admitting: Vascular Surgery

## 2017-05-14 ENCOUNTER — Ambulatory Visit (HOSPITAL_COMMUNITY)
Admission: RE | Admit: 2017-05-14 | Discharge: 2017-05-14 | Disposition: A | Discharge status: Home / Self Care | Payer: Medicaid Other | Source: Ambulatory Visit | Attending: Vascular Surgery | Admitting: Vascular Surgery

## 2017-05-14 DIAGNOSIS — F17200 Nicotine dependence, unspecified, uncomplicated: Secondary | ICD-10-CM

## 2017-05-14 DIAGNOSIS — I779 Disorder of arteries and arterioles, unspecified: Secondary | ICD-10-CM | POA: Diagnosis present

## 2017-05-19 NOTE — Progress Notes (Signed)
    Postoperative Visit   History of Present Illness   Brady Bell is a 62 y.o. year old male who presents for postoperative follow-up for: L iliofem EA w/ BPA for sub-total occlusion of L CFA (04/28/17).  The patient's wounds are not healed.  The patient notes pain in L groin incision.  The patient is able to complete their activities of daily living.  No fever or chills  For VQI Use Only   PRE-ADM LIVING: Home  AMB STATUS: Ambulatory   Physical Examination   Vitals:   05/20/17 1223  BP: (!) 149/96  Pulse: 81  Resp: 16  Temp: (!) 97.5 F (36.4 C)  TempSrc: Oral  SpO2: 94%  Weight: 138 lb (62.6 kg)  Height: 5\' 10"  (1.778 m)    LLE: groin incision: erythema peri-incision with tenderness to light touch, graft palpable   Medical Decision Making   Brady Bell is a 62 y.o. year old male who presents s/p L iliofemoral EA w/ BPA complicated with cellulitis   Pt continues to smoke which obviously is interfering with his healing.  Discussed washing off the left groin daily with soap and water.  Dry bandage to the left groin.  Augmentin DS 1 PO BID x 10 days. I also gave the patent Percocet 5/325 mg 1-2 po q4-6 hr prn pain #20 only, no refills Follow up in 2 weeks for wound check  Leonides SakeBrian Chen, MD, FACS [facility] [place] Office: [phone] Pager: [phone]

## 2017-05-20 ENCOUNTER — Other Ambulatory Visit: Payer: Self-pay

## 2017-05-20 ENCOUNTER — Ambulatory Visit (INDEPENDENT_AMBULATORY_CARE_PROVIDER_SITE_OTHER): Payer: Medicaid Other | Admitting: Vascular Surgery

## 2017-05-20 ENCOUNTER — Encounter: Payer: Self-pay | Admitting: Vascular Surgery

## 2017-05-20 VITALS — BP 149/96 | HR 81 | Temp 97.5°F | Resp 16 | Ht 70.0 in | Wt 138.0 lb

## 2017-05-20 DIAGNOSIS — I739 Peripheral vascular disease, unspecified: Secondary | ICD-10-CM

## 2017-05-20 MED ORDER — OXYCODONE-ACETAMINOPHEN 5-325 MG PO TABS: 1.0000 | ORAL_TABLET | ORAL | 0 refills | Status: DC | PRN

## 2017-05-20 MED ORDER — AMOXICILLIN-POT CLAVULANATE 875-125 MG PO TABS: 1.0000 | ORAL_TABLET | Freq: Two times a day (BID) | ORAL | 0 refills | Status: DC

## 2017-06-01 ENCOUNTER — Other Ambulatory Visit (HOSPITAL_COMMUNITY): Payer: Medicaid Other

## 2017-06-01 NOTE — Progress Notes (Signed)
    Postoperative Visit   History of Present Illness   Leim FabryKenneth L Everly is a 62 y.o. year old male who presents for postoperative follow-up for: L iliofem EA w/ BPA for sub-total occlusion of L CFA (04/28/17).  The patient's wounds are healed.  The patient notes no further pain in L groin.  The patient is able to complete their activities of daily living.  No fever or chills   For VQI Use Only   PRE-ADM LIVING: Home  AMB STATUS: Ambulatory   Physical Examination   Vitals:   06/03/17 1427  BP: (!) 136/93  Pulse: 98  Resp: 18  Temp: (!) 97.3 F (36.3 C)  TempSrc: Oral  SpO2: 96%  Weight: 140 lb (63.5 kg)  Height: 5\' 8"  (1.727 m)    LLE: groin incision: near healed, palpable graft pulse   Medical Decision Making   Leim FabryKenneth L Ladd is a 62 y.o. year old male who presents s/p L iliofemoral EA w/ BPA complicated with cellulitis   Cellulitis is resolved at this point  L groin incision is nearly healed.  ABI and LLE arterial duplex in 3 months  Leonides SakeBrian Joanie Duprey, MD, [facility] [place] Office: [phone] Pager: [phone]

## 2017-06-03 ENCOUNTER — Ambulatory Visit (INDEPENDENT_AMBULATORY_CARE_PROVIDER_SITE_OTHER): Payer: Medicaid Other | Admitting: Vascular Surgery

## 2017-06-03 ENCOUNTER — Other Ambulatory Visit: Payer: Self-pay

## 2017-06-03 ENCOUNTER — Encounter: Payer: Self-pay | Admitting: Vascular Surgery

## 2017-06-03 VITALS — BP 136/93 | HR 98 | Temp 97.3°F | Resp 18 | Ht 68.0 in | Wt 140.0 lb

## 2017-06-03 DIAGNOSIS — I70212 Atherosclerosis of native arteries of extremities with intermittent claudication, left leg: Secondary | ICD-10-CM

## 2017-06-03 DIAGNOSIS — I739 Peripheral vascular disease, unspecified: Secondary | ICD-10-CM

## 2017-07-15 ENCOUNTER — Ambulatory Visit (HOSPITAL_COMMUNITY): Admission: RE | Admit: 2017-07-15 | Payer: Medicaid Other | Source: Ambulatory Visit | Admitting: Otolaryngology

## 2017-07-15 HISTORY — DX: Chronic sinusitis, unspecified: J32.9

## 2017-07-15 SURGERY — MAXILLARY ANTROSTOMY
Anesthesia: General | Laterality: Bilateral

## 2017-07-22 ENCOUNTER — Inpatient Hospital Stay (HOSPITAL_COMMUNITY): Payer: Medicaid Other

## 2017-07-22 ENCOUNTER — Emergency Department (HOSPITAL_COMMUNITY): Payer: Medicaid Other

## 2017-07-22 ENCOUNTER — Encounter (HOSPITAL_COMMUNITY): Payer: Self-pay | Admitting: *Deleted

## 2017-07-22 ENCOUNTER — Other Ambulatory Visit: Payer: Self-pay

## 2017-07-22 ENCOUNTER — Inpatient Hospital Stay (HOSPITAL_COMMUNITY)
Admission: EM | Admit: 2017-07-22 | Discharge: 2017-07-24 | DRG: 439 | Discharge status: Against Medical Advice | Payer: Medicaid Other | Attending: Pulmonary Disease | Admitting: Pulmonary Disease

## 2017-07-22 DIAGNOSIS — I160 Hypertensive urgency: Secondary | ICD-10-CM | POA: Diagnosis present

## 2017-07-22 DIAGNOSIS — I10 Essential (primary) hypertension: Secondary | ICD-10-CM | POA: Insufficient documentation

## 2017-07-22 DIAGNOSIS — K8000 Calculus of gallbladder with acute cholecystitis without obstruction: Secondary | ICD-10-CM | POA: Diagnosis present

## 2017-07-22 DIAGNOSIS — F17210 Nicotine dependence, cigarettes, uncomplicated: Secondary | ICD-10-CM | POA: Diagnosis present

## 2017-07-22 DIAGNOSIS — F1020 Alcohol dependence, uncomplicated: Secondary | ICD-10-CM | POA: Diagnosis present

## 2017-07-22 DIAGNOSIS — G43909 Migraine, unspecified, not intractable, without status migrainosus: Secondary | ICD-10-CM | POA: Diagnosis present

## 2017-07-22 DIAGNOSIS — G43709 Chronic migraine without aura, not intractable, without status migrainosus: Secondary | ICD-10-CM | POA: Diagnosis present

## 2017-07-22 DIAGNOSIS — Z7982 Long term (current) use of aspirin: Secondary | ICD-10-CM | POA: Diagnosis not present

## 2017-07-22 DIAGNOSIS — Z811 Family history of alcohol abuse and dependence: Secondary | ICD-10-CM

## 2017-07-22 DIAGNOSIS — Z716 Tobacco abuse counseling: Secondary | ICD-10-CM

## 2017-07-22 DIAGNOSIS — K851 Biliary acute pancreatitis without necrosis or infection: Secondary | ICD-10-CM | POA: Diagnosis not present

## 2017-07-22 DIAGNOSIS — K859 Acute pancreatitis without necrosis or infection, unspecified: Secondary | ICD-10-CM | POA: Diagnosis present

## 2017-07-22 DIAGNOSIS — F419 Anxiety disorder, unspecified: Secondary | ICD-10-CM | POA: Diagnosis present

## 2017-07-22 DIAGNOSIS — K852 Alcohol induced acute pancreatitis without necrosis or infection: Secondary | ICD-10-CM | POA: Diagnosis present

## 2017-07-22 DIAGNOSIS — I739 Peripheral vascular disease, unspecified: Secondary | ICD-10-CM | POA: Diagnosis present

## 2017-07-22 DIAGNOSIS — J449 Chronic obstructive pulmonary disease, unspecified: Secondary | ICD-10-CM | POA: Diagnosis present

## 2017-07-22 DIAGNOSIS — Z7951 Long term (current) use of inhaled steroids: Secondary | ICD-10-CM | POA: Diagnosis not present

## 2017-07-22 DIAGNOSIS — F1010 Alcohol abuse, uncomplicated: Secondary | ICD-10-CM

## 2017-07-22 DIAGNOSIS — G40909 Epilepsy, unspecified, not intractable, without status epilepticus: Secondary | ICD-10-CM | POA: Diagnosis present

## 2017-07-22 DIAGNOSIS — R0789 Other chest pain: Secondary | ICD-10-CM | POA: Diagnosis present

## 2017-07-22 DIAGNOSIS — J439 Emphysema, unspecified: Secondary | ICD-10-CM | POA: Diagnosis present

## 2017-07-22 DIAGNOSIS — I4892 Unspecified atrial flutter: Secondary | ICD-10-CM | POA: Diagnosis present

## 2017-07-22 DIAGNOSIS — R112 Nausea with vomiting, unspecified: Secondary | ICD-10-CM

## 2017-07-22 DIAGNOSIS — IMO0002 Reserved for concepts with insufficient information to code with codable children: Secondary | ICD-10-CM | POA: Diagnosis present

## 2017-07-22 DIAGNOSIS — Z79899 Other long term (current) drug therapy: Secondary | ICD-10-CM | POA: Diagnosis not present

## 2017-07-22 LAB — COMPREHENSIVE METABOLIC PANEL
ALK PHOS: 130 U/L — AB (ref 38–126)
ALT: 19 U/L (ref 17–63)
ANION GAP: 14 (ref 5–15)
AST: 33 U/L (ref 15–41)
Albumin: 3.6 g/dL (ref 3.5–5.0)
BILIRUBIN TOTAL: 1.6 mg/dL — AB (ref 0.3–1.2)
BUN: 13 mg/dL (ref 6–20)
CALCIUM: 9.2 mg/dL (ref 8.9–10.3)
CO2: 27 mmol/L (ref 22–32)
Chloride: 93 mmol/L — ABNORMAL LOW (ref 101–111)
Creatinine, Ser: 1.11 mg/dL (ref 0.61–1.24)
GFR calc Af Amer: >60 mL/min (ref >60)
GFR calc non Af Amer: >60 mL/min (ref >60)
GLUCOSE: 136 mg/dL — AB (ref 65–99)
POTASSIUM: 4.1 mmol/L (ref 3.5–5.1)
SODIUM: 134 mmol/L — AB (ref 135–145)
Total Protein: 7.7 g/dL (ref 6.5–8.1)

## 2017-07-22 LAB — URINALYSIS, ROUTINE W REFLEX MICROSCOPIC
BACTERIA UA: NONE SEEN
GLUCOSE, UA: NEGATIVE mg/dL
Hgb urine dipstick: NEGATIVE
KETONES UR: 20 mg/dL — AB
Leukocytes, UA: NEGATIVE
Nitrite: NEGATIVE
PROTEIN: 100 mg/dL — AB
Specific Gravity, Urine: 1.028 (ref 1.005–1.030)
pH: 6 (ref 5.0–8.0)

## 2017-07-22 LAB — CBC WITH DIFFERENTIAL/PLATELET
Basophils Absolute: 0 10*3/uL (ref 0.0–0.1)
Basophils Relative: 0 %
Eosinophils Absolute: 0.1 10*3/uL (ref 0.0–0.7)
Eosinophils Relative: 0 %
HEMATOCRIT: 54.2 % — AB (ref 39.0–52.0)
HEMOGLOBIN: 18.7 g/dL — AB (ref 13.0–17.0)
LYMPHS ABS: 0.9 10*3/uL (ref 0.7–4.0)
Lymphocytes Relative: 5 %
MCH: 33.9 pg (ref 26.0–34.0)
MCHC: 34.5 g/dL (ref 30.0–36.0)
MCV: 98.2 fL (ref 78.0–100.0)
MONO ABS: 1.8 10*3/uL — AB (ref 0.1–1.0)
MONOS PCT: 11 %
NEUTROS ABS: 13.9 10*3/uL — AB (ref 1.7–7.7)
NEUTROS PCT: 84 %
Platelets: 150 10*3/uL (ref 150–400)
RBC: 5.52 MIL/uL (ref 4.22–5.81)
RDW: 13.1 % (ref 11.5–15.5)
WBC: 16.7 10*3/uL — ABNORMAL HIGH (ref 4.0–10.5)

## 2017-07-22 LAB — TROPONIN I

## 2017-07-22 LAB — LIPASE, BLOOD: Lipase: 846 U/L — ABNORMAL HIGH (ref 11–51)

## 2017-07-22 MED ORDER — SODIUM CHLORIDE 0.9 % IV BOLUS
1000.0000 mL | Freq: Once | INTRAVENOUS | Status: AC
Start: 2017-07-22 — End: 2017-07-22
  Administered 2017-07-22: 1000 mL via INTRAVENOUS

## 2017-07-22 MED ORDER — VITAMIN B-1 100 MG PO TABS
100.0000 mg | ORAL_TABLET | Freq: Every day | ORAL | Status: DC
  Administered 2017-07-22 – 2017-07-24 (×3): 100 mg via ORAL
  Filled 2017-07-22 (×3): qty 1

## 2017-07-22 MED ORDER — SODIUM CHLORIDE 0.9 % IV BOLUS
500.0000 mL | Freq: Once | INTRAVENOUS | Status: AC
  Administered 2017-07-22: 500 mL via INTRAVENOUS

## 2017-07-22 MED ORDER — THIAMINE HCL 100 MG/ML IJ SOLN: 100.0000 mg | Freq: Every day | INTRAMUSCULAR | Status: DC

## 2017-07-22 MED ORDER — NICOTINE 21 MG/24HR TD PT24
21.0000 mg | MEDICATED_PATCH | Freq: Every day | TRANSDERMAL | Status: DC
  Administered 2017-07-22 – 2017-07-24 (×3): 21 mg via TRANSDERMAL
  Filled 2017-07-22 (×3): qty 1

## 2017-07-22 MED ORDER — SODIUM CHLORIDE 0.9 % IV BOLUS
1000.0000 mL | Freq: Once | INTRAVENOUS | Status: AC
  Administered 2017-07-22: 1000 mL via INTRAVENOUS

## 2017-07-22 MED ORDER — ONDANSETRON HCL 4 MG/2ML IJ SOLN
4.0000 mg | Freq: Four times a day (QID) | INTRAMUSCULAR | Status: DC | PRN
  Administered 2017-07-22 – 2017-07-23 (×3): 4 mg via INTRAVENOUS
  Filled 2017-07-22 (×3): qty 2

## 2017-07-22 MED ORDER — KETOROLAC TROMETHAMINE 30 MG/ML IJ SOLN: 30.0000 mg | Freq: Once | INTRAMUSCULAR | Status: DC

## 2017-07-22 MED ORDER — FAMOTIDINE IN NACL 20-0.9 MG/50ML-% IV SOLN
20.0000 mg | Freq: Once | INTRAVENOUS | Status: AC
  Administered 2017-07-22: 20 mg via INTRAVENOUS
  Filled 2017-07-22: qty 50

## 2017-07-22 MED ORDER — HYDRALAZINE HCL 20 MG/ML IJ SOLN
10.0000 mg | Freq: Four times a day (QID) | INTRAMUSCULAR | Status: DC | PRN
  Administered 2017-07-22 – 2017-07-24 (×2): 10 mg via INTRAVENOUS
  Filled 2017-07-22 (×2): qty 1

## 2017-07-22 MED ORDER — LORAZEPAM 2 MG/ML IJ SOLN
1.0000 mg | Freq: Four times a day (QID) | INTRAMUSCULAR | Status: DC | PRN
  Administered 2017-07-22: 1 mg via INTRAVENOUS
  Filled 2017-07-22: qty 1

## 2017-07-22 MED ORDER — MORPHINE SULFATE (PF) 2 MG/ML IV SOLN
2.0000 mg | INTRAVENOUS | Status: DC | PRN
  Administered 2017-07-22 – 2017-07-24 (×16): 2 mg via INTRAVENOUS
  Filled 2017-07-22 (×17): qty 1

## 2017-07-22 MED ORDER — FLUTICASONE PROPIONATE 50 MCG/ACT NA SUSP
2.0000 | Freq: Every day | NASAL | Status: DC
  Administered 2017-07-22 – 2017-07-24 (×3): 2 via NASAL
  Filled 2017-07-22: qty 16

## 2017-07-22 MED ORDER — LORAZEPAM 1 MG PO TABS
1.0000 mg | ORAL_TABLET | Freq: Four times a day (QID) | ORAL | Status: DC | PRN
  Administered 2017-07-22 – 2017-07-24 (×3): 1 mg via ORAL
  Filled 2017-07-22 (×3): qty 1

## 2017-07-22 MED ORDER — SODIUM CHLORIDE 0.9 % IV SOLN
INTRAVENOUS | Status: AC
  Administered 2017-07-22 – 2017-07-23 (×4): via INTRAVENOUS

## 2017-07-22 MED ORDER — ONDANSETRON HCL 4 MG/2ML IJ SOLN
4.0000 mg | Freq: Once | INTRAMUSCULAR | Status: AC
  Administered 2017-07-22: 4 mg via INTRAVENOUS
  Filled 2017-07-22: qty 2

## 2017-07-22 MED ORDER — MORPHINE SULFATE (PF) 2 MG/ML IV SOLN: 2.0000 mg | INTRAVENOUS | Status: DC | PRN

## 2017-07-22 MED ORDER — FENTANYL CITRATE (PF) 100 MCG/2ML IJ SOLN
50.0000 ug | Freq: Once | INTRAMUSCULAR | Status: AC
  Administered 2017-07-22: 50 ug via INTRAVENOUS
  Filled 2017-07-22: qty 2

## 2017-07-22 MED ORDER — ONDANSETRON HCL 4 MG PO TABS: 4.0000 mg | ORAL_TABLET | Freq: Four times a day (QID) | ORAL | Status: DC | PRN

## 2017-07-22 MED ORDER — DICYCLOMINE HCL 10 MG/ML IM SOLN
20.0000 mg | Freq: Once | INTRAMUSCULAR | Status: AC
  Administered 2017-07-22: 20 mg via INTRAMUSCULAR
  Filled 2017-07-22: qty 2

## 2017-07-22 MED ORDER — ENOXAPARIN SODIUM 40 MG/0.4ML ~~LOC~~ SOLN
40.0000 mg | SUBCUTANEOUS | Status: DC
  Administered 2017-07-22 – 2017-07-24 (×3): 40 mg via SUBCUTANEOUS
  Filled 2017-07-22 (×3): qty 0.4

## 2017-07-22 MED ORDER — CEFTRIAXONE SODIUM 2 G IJ SOLR
2.0000 g | INTRAMUSCULAR | Status: DC
  Administered 2017-07-23 – 2017-07-24 (×2): 2 g via INTRAVENOUS
  Filled 2017-07-22 (×3): qty 20
  Filled 2017-07-22: qty 2
  Filled 2017-07-22: qty 20

## 2017-07-22 MED ORDER — SODIUM CHLORIDE 0.9 % IV SOLN
2.0000 g | Freq: Once | INTRAVENOUS | Status: AC
  Administered 2017-07-22: 2 g via INTRAVENOUS
  Filled 2017-07-22: qty 20

## 2017-07-22 MED ORDER — IOPAMIDOL (ISOVUE-300) INJECTION 61%
100.0000 mL | Freq: Once | INTRAVENOUS | Status: AC | PRN
  Administered 2017-07-22: 100 mL via INTRAVENOUS

## 2017-07-22 NOTE — ED Notes (Signed)
Paged attending Dhungel that patient would like nicotene patch. Per MD be down to see patient. Patient aware.

## 2017-07-22 NOTE — ED Notes (Signed)
Pt asking for nicotine patch, pt notified that dr will come see him and put in order for patch.

## 2017-07-22 NOTE — ED Triage Notes (Signed)
Pt reports abdominal pain x 1 day with bilateral flank pain. Pt had n/v earlier in the day.

## 2017-07-22 NOTE — ED Notes (Signed)
Patient transported to Ultrasound 

## 2017-07-22 NOTE — ED Provider Notes (Signed)
[facility] EMERGENCY DEPARTMENT Provider Note   CSN: [id number] Arrival date & time: 07/22/17  [id number]  Time seen 03:50 AM   History   Chief Complaint Chief Complaint  Patient presents with  . Abdominal Pain    HPI Brady Bell is a 62 y.o. male.  HPI patient states on April 21 he had walked to the store which was over a mile and when he got there he got nauseated and had one episode of vomiting.  He states he was fine until about 6 AM yesterday morning when he started having nausea and vomiting about 6-8 times.  He was taking Zofran which helped with the vomiting.  He last took it about 2 PM.  He does continue to have some nausea.  He has had some intermittent acid reflux.  He denies any diarrhea.  He denies feeling dizzy or lightheaded.  He states he has been drinking fluids.  He denies fever but has had some chills.  He states he is having continuous diffuse chest pain and abdominal pain that started after he had the vomiting.  He describes the abdominal pain as cramping and the chest pain as aching.  He states his rib cage and his kidneys are sore and the pain gets worse if he lays on his side or he breathes deep and then it becomes sharp.  He denies any change in his diet, he denies being around anybody else who is ill.  PCP Kari BaarsHawkins, Edward, MD  Past Medical History:  Diagnosis Date  . Chronic ankle pain   . Chronic knee pain   . GERD (gastroesophageal reflux disease)   . Hepatitis    Hep A in 1980's  . Hypoglycemia   . Migraines   . Peripheral vascular disease (HCC)   . Recurrent sinus infections   . Seizures (HCC)   . Shingles     Patient Active Problem List   Diagnosis Date Noted  . Pancreatitis 07/22/2017  . PAD (peripheral artery disease) (HCC) 04/28/2017  . Chronic migraine 02/11/2017  . New daily persistent headache 12/08/2016  . Peripheral vascular disease (HCC) 12/08/2016  . Pain in joint, lower leg 02/10/2014  . Numbness and tingling of left leg 12/16/2013   . Cramps of left lower extremity-Leg 12/16/2013  . Aftercare following surgery of the circulatory system, NEC 12/16/2013  . PVD (peripheral vascular disease) with claudication (HCC) 05/07/2012  . Pain in limb 03/26/2012  . Pain, lower leg 12/26/2011  . Atherosclerosis of native arteries of extremity with intermittent claudication (HCC) 09/26/2011  . CLOSED FRACTURE OF UPPER END OF TIBIA 02/27/2010  . TEAR MEDIAL MENISCUS 02/27/2010    Past Surgical History:  Procedure Laterality Date  . ABDOMINAL AORTOGRAM N/A 04/23/2017   Procedure: ABDOMINAL AORTOGRAM;  Surgeon: Fransisco Hertzhen, Brian L, MD;  [facility];  Service: Cardiovascular;  Laterality: N/A;  . ENDARTERECTOMY FEMORAL Left 04/28/2017   Procedure: ENDARTERECTOMY LEFT ILIO-FEMORAL ARTERY;  Surgeon: Fransisco Hertzhen, Brian L, MD;  [facility];  Service: Vascular;  Laterality: Left;  . FASCIOTOMY  09/06/2011   Procedure: FASCIOTOMY;  Surgeon: Fransisco HertzBrian L Chen, MD;  [facility];  Service: Vascular;  Laterality: Left;  . FEMORAL-TIBIAL BYPASS GRAFT  09/06/2011   Procedure: BYPASS GRAFT FEMORAL-TIBIAL ARTERY;  Surgeon: Fransisco HertzBrian L Chen, MD;  [facility];  Service: Vascular;  Laterality: Left;  . KNEE CARTILAGE SURGERY Left   . LOWER EXTREMITY ANGIOGRAM N/A 09/05/2011   Procedure: LOWER EXTREMITY ANGIOGRAM;  Surgeon: Sherren Kernsharles E Fields,  MD;  Location: [facility] CATH LAB;  Service: Cardiovascular;  Laterality: N/A;  . LOWER EXTREMITY ANGIOGRAM Left 04/15/2012   Procedure: LOWER EXTREMITY ANGIOGRAM;  Surgeon: Fransisco Hertz, MD;  Location: [facility] CATH LAB;  Service: Cardiovascular;  Laterality: Left;  . LOWER EXTREMITY ANGIOGRAPHY Bilateral 04/23/2017   Procedure: Lower Extremity Angiography;  Surgeon: Fransisco Hertz, MD;  Location: [facility] INVASIVE CV LAB;  Service: Cardiovascular;  Laterality: Bilateral;  . PATCH ANGIOPLASTY Left 04/28/2017   Procedure: ILIO-FEMORAL ARTERY PATCH ANGIOPLASTY USING Livia Snellen BIOLOGIC PATCH;  Surgeon: Fransisco Hertz, MD;  Location: [facility] OR;   Service: Vascular;  Laterality: Left;  . PR VEIN BYPASS GRAFT,AORTO-FEM-POP  09/06/11   Left   . TOOTH EXTRACTION  June-July-Aug. 2015   Several         Home Medications    Prior to Admission medications   Medication Sig Start Date End Date Taking? Authorizing Provider  amoxicillin-clavulanate (AUGMENTIN) 875-125 MG tablet Take 1 tablet by mouth every 12 (twelve) hours. Patient not taking: Reported on 06/03/2017 05/20/17   Fransisco Hertz, MD  aspirin EC 81 MG tablet Take 81 mg by mouth daily.    [provider]  cycloSPORINE (RESTASIS) 0.05 % ophthalmic emulsion Place 1 drop into both eyes 2 (two) times daily.    [provider]  divalproex (DEPAKOTE ER) 500 MG 24 hr tablet Take 1 tablet (500 mg total) at bedtime by mouth. 02/11/17   Levert Feinstein, MD  eletriptan (RELPAX) 40 MG tablet Take 40 mg by mouth every 2 (two) hours as needed for migraine or headache. May repeat in 2 hours if headache persists or recurs.     [provider]  fluticasone (FLONASE) 50 MCG/ACT nasal spray Place 2 sprays into both nostrils daily.    [provider]  gabapentin (NEURONTIN) 300 MG capsule Take 300 mg by mouth daily.     [provider]  Multiple Vitamin (MULTIVITAMIN WITH MINERALS) TABS tablet Take 1 tablet by mouth daily.    [provider]  naproxen sodium (ALEVE) 220 MG tablet Take 220 mg by mouth 2 (two) times daily as needed (takes 1 tablet every morning and again later if needed for pain). Takes 1 tablet every morning     [provider]  ondansetron (ZOFRAN ODT) 4 MG disintegrating tablet Take 1 tablet (4 mg total) by mouth every 8 (eight) hours as needed. Patient taking differently: Take 4 mg by mouth every 8 (eight) hours as needed for nausea.  12/08/16   Levert Feinstein, MD  oxyCODONE (OXY IR/ROXICODONE) 5 MG immediate release tablet Take 1 tablet (5 mg total) by mouth every 4 (four) hours as needed for moderate pain. 04/29/17   Lars Mage,  PA-C  oxyCODONE-acetaminophen (PERCOCET/ROXICET) 5-325 MG tablet Take 1 tablet by mouth every 4 (four) hours as needed for severe pain. 05/20/17   Fransisco Hertz, MD  SUMAtriptan (IMITREX STATDOSE SYSTEM) 6 MG/0.5ML SOAJ Inject 6 mg into the skin as needed. Patient taking differently: Inject 6 mg into the skin as needed.  12/08/16   Levert Feinstein, MD  SUMAtriptan (IMITREX) 100 MG tablet Take 1 tablet (100 mg total) by mouth every 2 (two) hours as needed for migraine. May repeat in 2 hours if headache persists or recurs. 12/08/16   Levert Feinstein, MD    Family History Family History  Problem Relation Age of Onset  . Alcohol abuse Father   . Kidney failure Father   . Other Mother        "  age"  . Diabetes Unknown     Social History Social History   Tobacco Use  . Smoking status: Current Every Day Smoker    Packs/day: 0.50    Years: 30.00    Pack years: 15.00    Types: Cigarettes  . Smokeless tobacco: Never Used  . Tobacco comment: pt states that he is trying his best to quit  Substance Use Topics  . Alcohol use: Yes    Alcohol/week: 0.0 oz    Comment: Few drinks per day  . Drug use: Yes    Types: Marijuana    Comment: occasionally  lives at home Lives alone   Allergies   Patient has no known allergies.   Review of Systems Review of Systems  All other systems reviewed and are negative.    Physical Exam Updated Vital Signs BP (!) 182/115   Pulse 98   Temp 98.3 F (36.8 C) (Oral)   Resp 17   Ht 5\' 8"  (1.727 m)   Wt 65.8 kg (145 lb)   SpO2 100%   BMI 22.05 kg/m   Vital signs normal except for hypertension and borderline tachycardia   Physical Exam  Constitutional: He is oriented to person, place, and time.  Thin male  HENT:  Head: Normocephalic and atraumatic.  Right Ear: External ear normal.  Left Ear: External ear normal.  Nose: Nose normal.  Mouth/Throat: Uvula is midline. Mucous membranes are dry.  Eyes: Pupils are equal, round, and reactive to light.  Conjunctivae and EOM are normal.  Neck: Normal range of motion.  Cardiovascular: Normal rate, regular rhythm and normal heart sounds.  Pulmonary/Chest: Effort normal and breath sounds normal. No respiratory distress. He exhibits tenderness.  Patient has a mild pectus excavatum, he has some mild tenderness of his chest wall diffusely.  Abdominal: Soft. Bowel sounds are normal. He exhibits no distension and no mass. There is generalized tenderness. There is no guarding.  Patient has diffuse abdominal pain but it seems to be worse around his umbilicus  Musculoskeletal: Normal range of motion. He exhibits no edema, tenderness or deformity.  Patient has a well-healing incision in his left groin from his femoropopliteal bypass  Neurological: He is alert and oriented to person, place, and time. No cranial nerve deficit.  Skin: Skin is warm and dry.  Psychiatric: He has a normal mood and affect. His behavior is normal. Thought content normal.  Nursing note and vitals reviewed.    ED Treatments / Results  Labs (all labs ordered are listed, but only abnormal results are displayed) Results for orders placed or performed during the hospital encounter of 07/22/17  Comprehensive metabolic panel  Result Value Ref Range   Sodium 134 (L) 135 - 145 mmol/L   Potassium 4.1 3.5 - 5.1 mmol/L   Chloride 93 (L) 101 - 111 mmol/L   CO2 27 22 - 32 mmol/L   Glucose, Bld 136 (H) 65 - 99 mg/dL   BUN 13 6 - 20 mg/dL   Creatinine, Ser 9.14 0.61 - 1.24 mg/dL   Calcium 9.2 8.9 - 78.2 mg/dL   Total Protein 7.7 6.5 - 8.1 g/dL   Albumin 3.6 3.5 - 5.0 g/dL   AST 33 15 - 41 U/L   ALT 19 17 - 63 U/L   Alkaline Phosphatase 130 (H) 38 - 126 U/L   Total Bilirubin 1.6 (H) 0.3 - 1.2 mg/dL   GFR calc non Af Amer >60 >60 mL/min   GFR calc Af Amer >60 >60 mL/min  Anion gap 14 5 - 15  Lipase, blood  Result Value Ref Range   Lipase 846 (H) 11 - 51 U/L  CBC with Differential  Result Value Ref Range   WBC 16.7 (H) 4.0 -  10.5 K/uL   RBC 5.52 4.22 - 5.81 MIL/uL   Hemoglobin 18.7 (H) 13.0 - 17.0 g/dL   HCT 16.1 (H) 09.6 - 04.5 %   MCV 98.2 78.0 - 100.0 fL   MCH 33.9 26.0 - 34.0 pg   MCHC 34.5 30.0 - 36.0 g/dL   RDW 40.9 81.1 - 91.4 %   Platelets 150 150 - 400 K/uL   Neutrophils Relative % 84 %   Neutro Abs 13.9 (H) 1.7 - 7.7 K/uL   Lymphocytes Relative 5 %   Lymphs Abs 0.9 0.7 - 4.0 K/uL   Monocytes Relative 11 %   Monocytes Absolute 1.8 (H) 0.1 - 1.0 K/uL   Eosinophils Relative 0 %   Eosinophils Absolute 0.1 0.0 - 0.7 K/uL   Basophils Relative 0 %   Basophils Absolute 0.0 0.0 - 0.1 K/uL  Urinalysis, Routine w reflex microscopic  Result Value Ref Range   Color, Urine AMBER (A) YELLOW   APPearance CLOUDY (A) CLEAR   Specific Gravity, Urine 1.028 1.005 - 1.030   pH 6.0 5.0 - 8.0   Glucose, UA NEGATIVE NEGATIVE mg/dL   Hgb urine dipstick NEGATIVE NEGATIVE   Bilirubin Urine SMALL (A) NEGATIVE   Ketones, ur 20 (A) NEGATIVE mg/dL   Protein, ur 782 (A) NEGATIVE mg/dL   Nitrite NEGATIVE NEGATIVE   Leukocytes, UA NEGATIVE NEGATIVE   RBC / HPF 6-10 0 - 5 RBC/hpf   WBC, UA 6-10 0 - 5 WBC/hpf   Bacteria, UA NONE SEEN NONE SEEN   Squamous Epithelial / LPF 0-5 0 - 5   Mucus PRESENT    Hyaline Casts, UA PRESENT   Troponin I  Result Value Ref Range   Troponin I <0.03 <0.03 ng/mL   Laboratory interpretation all normal except concentrated hemoglobin consistent with dehydration, leukocytosis, very elevated lipase consistent with pancreatitis    EKG EKG Interpretation  Date/Time:  Wednesday July 22 2017 04:20:21 EDT Ventricular Rate:  110 PR Interval:    QRS Duration: 93 QT Interval:  332 QTC Calculation: 450 R Axis:   43 Text Interpretation:  Sinus tachycardia Atrial premature complexes Consider right atrial enlargement Repol abnrm suggests ischemia, diffuse leads Since last tracing rate faster 23 Apr 2017 ST more depressed Inferolateral leads Confirmed by Devoria Albe ([id number]) on 07/22/2017 4:53:08  AM   Radiology Ct Abdomen Pelvis W Contrast  Result Date: 07/22/2017 CLINICAL DATA:  Abdominal pain and bilateral flank pain for 1 day. Nausea and vomiting. EXAM: CT ABDOMEN AND PELVIS WITH CONTRAST TECHNIQUE: Multidetector CT imaging of the abdomen and pelvis was performed using the standard protocol following bolus administration of intravenous contrast. CONTRAST:  ISOVUE-300 IOPAMIDOL (ISOVUE-300) INJECTION 61% COMPARISON:  None. FINDINGS: Lower chest: Mild scarring in the lung bases. Bullous changes in the lung bases. Hepatobiliary: Gallbladder is contracted with edematous and enhancing wall. This is nonspecific but may represent cholecystitis. No definite radiopaque stones. No bile duct dilatation. No focal liver lesions. Pancreas: Edema surrounding the pancreas, most prominent along the tail. Fluid tracks along the pericolic gutters bilaterally. Changes are consistent with acute pancreatitis. No loculated fluid collections. No pancreatic ductal dilatation. Homogeneous enhancement of the pancreatic parenchyma without evidence of pancreatic necrosis. Spleen: Calcified granulomas in the spleen. Adrenals/Urinary Tract: Adrenal glands are unremarkable.  Kidneys are normal, without renal calculi, focal lesion, or hydronephrosis. Bladder is unremarkable. Stomach/Bowel: Stomach is within normal limits. Appendix appears normal. No evidence of bowel wall thickening, distention, or inflammatory changes. Vascular/Lymphatic: Aortic atherosclerosis. No enlarged abdominal or pelvic lymph nodes. Postoperative changes in the left groin with the proximal portion of a left femoral bypass graft demonstrated. 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 Nonocclusive peripheral thrombus. Reproductive: Prostate is unremarkable. Other: No free air in the abdomen. Abdominal wall musculature appears intact. Musculoskeletal: No acute or significant osseous findings. IMPRESSION: 1. Changes of acute pancreatitis  with edema and stranding around the pancreas most prominent along the tail. No evidence of abscess or pancreatic necrosis. 2. Gallbladder wall thickening and edema may represent cholecystitis. No stones or bile duct dilatation identified. 3. Postoperative changes in the left groin consistent with a femoral bypass graft. 4. Aortic atherosclerosis. 5. Bullous emphysematous changes in the lung bases. Electronically Signed   By: Burman Nieves M.D.   On: 07/22/2017 06:26    Procedures Procedures (including critical care time)  Medications Ordered in ED Medications  cefTRIAXone (ROCEPHIN) 2 g in sodium chloride 0.9 % 100 mL IVPB (has no administration in time range)  sodium chloride 0.9 % bolus 1,000 mL (0 mLs Intravenous Stopped 07/22/17 0516)  ondansetron (ZOFRAN) injection 4 mg (4 mg Intravenous Given 07/22/17 0421)  sodium chloride 0.9 % bolus 1,000 mL (0 mLs Intravenous Stopped 07/22/17 0601)  sodium chloride 0.9 % bolus 500 mL (0 mLs Intravenous Stopped 07/22/17 0626)  dicyclomine (BENTYL) injection 20 mg (20 mg Intramuscular Given 07/22/17 0610)  famotidine (PEPCID) IVPB 20 mg premix (0 mg Intravenous Stopped 07/22/17 0709)  iopamidol (ISOVUE-300) 61 % injection 100 mL (100 mLs Intravenous Contrast Given 07/22/17 0558)  fentaNYL (SUBLIMAZE) injection 50 mcg (50 mcg Intravenous Given 07/22/17 1610)     Initial Impression / Assessment and Plan / ED Course  I have reviewed the triage vital signs and the nursing notes.  Pertinent labs & imaging results that were available during my care of the patient were reviewed by me and considered in my medical decision making (see chart for details).    Patient was given IV fluids and IV Zofran for his nausea.  Laboratory testing was done.  After reviewing patient's laboratory testing CT was done to assess his pancreas.  When I talked to the patient he does admit to alcohol use.  He states he normally drinks several drinks a day however due to the death of  his friend's mother recently he has been drinking 1/5 a day.  He states he is never had pancreatitis before.  We discussed that his blood work was very elevated and he would need to be admitted to let things calm down.  We also are going to do a CT scan to make sure there is no complicating factors.  06:19 Dr Sherryll Burger, hospitalits, will have the day doctor admit  7:10 AM after reviewing his CT result patient was given a dose of antibiotics in case he does have a calculus cholecystitis, most likely the inflammation around his gallbladder is secondary to the inflammatory changes of his pancreas, patient is extremely thin.  Ultrasound the right upper quadrant was done to clarify that situation.  Patient was informed of the CT results in the need for ultrasound.  He is requesting a nicotine patch.  He was wanting to drink some water abuse asked to wait to after the ultrasound and the admitting doctor see him.  Final Clinical Impressions(s) / ED Diagnoses   Final diagnoses:  Non-intractable vomiting with nausea, unspecified vomiting type  Alcohol-induced acute pancreatitis, unspecified complication status  Alcohol abuse    Plan admission  Devoria Albe, MD, Concha Pyo, MD 07/22/17 [phone]

## 2017-07-22 NOTE — ED Notes (Signed)
Pt taken to ct 

## 2017-07-22 NOTE — H&P (Signed)
TRH H&P   Patient Demographics:    Brady Bell, is a 62 y.o. male  MRN: [id number]   DOB - 1956/03/28  Admit Date - 07/22/2017  Outpatient Primary MD for the patient is Kari Baars, MD  Referring MD: Dr. Lynelle Doctor  Outpatient Specialists: Dr. Imogene Burn (vascular surgery )  Patient coming from: Home  Chief Complaint  Patient presents with  . Abdominal Pain      HPI:    Brady Bell  is a 62 y.o. male, with a history of migraine headaches, peripheral arterial disease with recent left iliofemoral stenosis underwent endarterectomy with iliofemoral artery patch angioplasty January 2019, seizures, tobacco and ongoing alcohol use who presented with 3 days of pain in his mid abdomen and substernal area which is crampy and severe associated with several episodes of nausea and vomiting (at least 8 times).  He reports taking some Zofran without relief.  Reports mainly clear liquid vomiting.  Denies any diarrhea, fevers or chills.  Reports some headache along with dizziness and lightheadedness.  He has been trying to drink a lot of fluid for actively vomiting at the same time.  Denies similar symptoms in the past.  Denies any shortness of breath, orthopnea or PND. Denies eating anything outside or sick contact.  In the ED patient was hypertensive with blood pressure 182/1 1 5  mmHg, afebrile.  Blood work showed leukocytosis with WC of 16 point 7K, hemoglobin of 18.7, sodium 134, normal renal function, normal transaminases but elevated alkaline phosphatase of 130 and total bilirubin of 1.6.  Lipase was elevated at 846. Patient was given 2 and half liters normal saline along with IV fentanyl and antiemetics.  A CT of the abdomen and pelvis showed acute pancreatitis with edema and stranding around the pancreas most prominent along the tail without abscess or necrosis.  Also showed gallbladder wall  thickening and edema. An ultrasound of the abdomen done showed findings of acute calculus cholecystitis with tubal gallstones and positive Murphy sign.  CBD was not dilated and no CBD stones were noted. Hospitalist consulted for admission to medical floor.      Review of systems:    In addition to the HPI above, No Fever-chills, Headache + , no changes with Vision or hearing, No problems swallowing food or Liquids, No Chest pain, Cough or Shortness of Breath, Abdominal pain+++, nausea++, vomiting++, normal bowel movements. No Blood in stool or Urine, No dysuria, No new skin rashes or bruises, No new joints pains-aches,  Generalized weakness, dizziness, tingling, numbness in any extremity, No recent weight gain or loss, No polyuria, polydypsia or polyphagia, No significant Mental Stressors.    With Past History of the following :    Past Medical History:  Diagnosis Date  . Chronic ankle pain   . Chronic knee pain   . GERD (gastroesophageal reflux disease)   . Hepatitis  Hep A in 1980's  . Hypoglycemia   . Migraines   . Peripheral vascular disease (HCC)   . Recurrent sinus infections   . Seizures (HCC)   . Shingles       Past Surgical History:  Procedure Laterality Date  . ABDOMINAL AORTOGRAM N/A 04/23/2017   Procedure: ABDOMINAL AORTOGRAM;  Surgeon: Fransisco Hertzhen, Brian L, MD;  [facility];  Service: Cardiovascular;  Laterality: N/A;  . ENDARTERECTOMY FEMORAL Left 04/28/2017   Procedure: ENDARTERECTOMY LEFT ILIO-FEMORAL ARTERY;  Surgeon: Fransisco Hertzhen, Brian L, MD;  [facility];  Service: Vascular;  Laterality: Left;  . FASCIOTOMY  09/06/2011   Procedure: FASCIOTOMY;  Surgeon: Fransisco HertzBrian L Chen, MD;  [facility];  Service: Vascular;  Laterality: Left;  . FEMORAL-TIBIAL BYPASS GRAFT  09/06/2011   Procedure: BYPASS GRAFT FEMORAL-TIBIAL ARTERY;  Surgeon: Fransisco HertzBrian L Chen, MD;  [facility];  Service: Vascular;  Laterality: Left;  . KNEE CARTILAGE SURGERY Left   . LOWER  EXTREMITY ANGIOGRAM N/A 09/05/2011   Procedure: LOWER EXTREMITY ANGIOGRAM;  Surgeon: Sherren Kernsharles E Fields, MD;  [facility];  Service: Cardiovascular;  Laterality: N/A;  . LOWER EXTREMITY ANGIOGRAM Left 04/15/2012   Procedure: LOWER EXTREMITY ANGIOGRAM;  Surgeon: Fransisco HertzBrian L Chen, MD;  [facility];  Service: Cardiovascular;  Laterality: Left;  . LOWER EXTREMITY ANGIOGRAPHY Bilateral 04/23/2017   Procedure: Lower Extremity Angiography;  Surgeon: Fransisco Hertzhen, Brian L, MD;  [facility];  Service: Cardiovascular;  Laterality: Bilateral;  . PATCH ANGIOPLASTY Left 04/28/2017   Procedure: ILIO-FEMORAL ARTERY PATCH ANGIOPLASTY USING Livia SnellenXENOSURE BIOLOGIC PATCH;  Surgeon: Fransisco Hertzhen, Brian L, MD;  [facility];  Service: Vascular;  Laterality: Left;  . PR VEIN BYPASS GRAFT,AORTO-FEM-POP  09/06/11   Left   . TOOTH EXTRACTION  June-July-Aug. 2015   Several       Social History:     Social History   Tobacco Use  . Smoking status: Current Every Day Smoker    Packs/day: 0.50    Years: 30.00    Pack years: 15.00    Types: Cigarettes  . Smokeless tobacco: Never Used  . Tobacco comment: pt states that he is trying his best to quit  Substance Use Topics  . Alcohol use: Yes    Alcohol/week: 0.0 oz    Comment: Few drinks per day    Smokes half a pack per day and drinks 3-4 shots of liquor every day.  Lives -home  Mobility -independent     Family History :     Family History  Problem Relation Age of Onset  . Alcohol abuse Father   . Kidney failure Father   . Other Mother        "age"  . Diabetes Unknown       Home Medications:   Prior to Admission medications   Medication Sig Start Date End Date Taking? Authorizing Provider  aspirin EC 81 MG tablet Take 81 mg by mouth daily.   Yes [provider]  cycloSPORINE (RESTASIS) 0.05 % ophthalmic emulsion Place 1 drop into both eyes 2 (two) times daily.   Yes [provider]  divalproex (DEPAKOTE ER) 500 MG 24  hr tablet Take 1 tablet (500 mg total) at bedtime by mouth. 02/11/17  Yes Levert FeinsteinYan, Yijun, MD  eletriptan (RELPAX) 40 MG tablet Take 40 mg by mouth every 2 (two) hours as needed for migraine or headache. May repeat in 2 hours if headache persists or recurs.    Yes  [provider]  fluticasone (FLONASE) 50 MCG/ACT nasal spray Place 2 sprays into both nostrils daily.   Yes [provider]  gabapentin (NEURONTIN) 300 MG capsule Take 300 mg by mouth daily.    Yes [provider]  Multiple Vitamin (MULTIVITAMIN WITH MINERALS) TABS tablet Take 1 tablet by mouth daily.   Yes [provider]  naproxen sodium (ALEVE) 220 MG tablet Take 220 mg by mouth 2 (two) times daily as needed (takes 1 tablet every morning and again later if needed for pain). Takes 1 tablet every morning    Yes [provider]  ondansetron (ZOFRAN ODT) 4 MG disintegrating tablet Take 1 tablet (4 mg total) by mouth every 8 (eight) hours as needed. Patient taking differently: Take 4 mg by mouth every 8 (eight) hours as needed for nausea.  12/08/16  Yes Levert Feinstein, MD  SUMAtriptan (IMITREX STATDOSE SYSTEM) 6 MG/0.5ML SOAJ Inject 6 mg into the skin as needed. Patient taking differently: Inject 6 mg into the skin as needed.  12/08/16  Yes Levert Feinstein, MD  SUMAtriptan (IMITREX) 100 MG tablet Take 1 tablet (100 mg total) by mouth every 2 (two) hours as needed for migraine. May repeat in 2 hours if headache persists or recurs. 12/08/16  Yes Levert Feinstein, MD  amoxicillin-clavulanate (AUGMENTIN) 875-125 MG tablet Take 1 tablet by mouth every 12 (twelve) hours. Patient not taking: Reported on 06/03/2017 05/20/17   Fransisco Hertz, MD  oxyCODONE (OXY IR/ROXICODONE) 5 MG immediate release tablet Take 1 tablet (5 mg total) by mouth every 4 (four) hours as needed for moderate pain. Patient not taking: Reported on 07/22/2017 04/29/17   Lars Mage, PA-C  oxyCODONE-acetaminophen (PERCOCET/ROXICET) 5-325 MG tablet Take 1  tablet by mouth every 4 (four) hours as needed for severe pain. Patient not taking: Reported on 07/22/2017 05/20/17   Fransisco Hertz, MD     Allergies:    No Known Allergies   Physical Exam:   Vitals  Blood pressure (!) 158/101, pulse 94, temperature 98.3 F (36.8 C), temperature source Oral, resp. rate 18, height 5\' 8"  (1.727 m), weight 65.8 kg (145 lb), SpO2 96 %.   General middle-aged male lying in bed in no acute distress HEENT: Pupils reactive bilaterally, EOMI, no pallor, no icterus, dry oral mucosa, supple neck Chest: Clear to auscultation bilaterally, no added sounds CVS: Normal S1 and S2, no murmurs or gallop GI: Soft, nondistended, tense abdomen, diffuse tenderness to pressure, bowel sounds present Musculoskeletal: Warm, no edema, normal skin CNs: Alert and oriented, nonfocal      Data Review:    CBC Recent Labs  Lab 07/22/17 0422  WBC 16.7*  HGB 18.7*  HCT 54.2*  PLT 150  MCV 98.2  MCH 33.9  MCHC 34.5  RDW 13.1  LYMPHSABS 0.9  MONOABS 1.8*  EOSABS 0.1  BASOSABS 0.0   ------------------------------------------------------------------------------------------------------------------  Chemistries  Recent Labs  Lab 07/22/17 0422  NA 134*  K 4.1  CL 93*  CO2 27  GLUCOSE 136*  BUN 13  CREATININE 1.11  CALCIUM 9.2  AST 33  ALT 19  ALKPHOS 130*  BILITOT 1.6*   ------------------------------------------------------------------------------------------------------------------ estimated creatinine clearance is 66.7 mL/min (by C-G formula based on SCr of 1.11 mg/dL). ------------------------------------------------------------------------------------------------------------------ No results for input(s): TSH, T4TOTAL, T3FREE, THYROIDAB in the last 72 hours.  Invalid input(s): FREET3  Coagulation profile No results for input(s): INR, PROTIME in the last 168  hours. ------------------------------------------------------------------------------------------------------------------- No results for input(s): DDIMER in the last 72 hours. -------------------------------------------------------------------------------------------------------------------  Cardiac Enzymes Recent Labs  Lab 07/22/17 0422  TROPONINI <0.03   ------------------------------------------------------------------------------------------------------------------ No results found for: BNP   ---------------------------------------------------------------------------------------------------------------  Urinalysis    Component Value Date/Time   COLORURINE AMBER (A) 07/22/2017 0517   APPEARANCEUR CLOUDY (A) 07/22/2017 0517   LABSPEC 1.028 07/22/2017 0517   PHURINE 6.0 07/22/2017 0517   GLUCOSEU NEGATIVE 07/22/2017 0517   HGBUR NEGATIVE 07/22/2017 0517   BILIRUBINUR SMALL (A) 07/22/2017 0517   KETONESUR 20 (A) 07/22/2017 0517   PROTEINUR 100 (A) 07/22/2017 0517   UROBILINOGEN 0.2 01/05/2015 1316   NITRITE NEGATIVE 07/22/2017 0517   LEUKOCYTESUR NEGATIVE 07/22/2017 0517    ----------------------------------------------------------------------------------------------------------------   Imaging Results:    Ct Abdomen Pelvis W Contrast  Result Date: 07/22/2017 CLINICAL DATA:  Abdominal pain and bilateral flank pain for 1 day. Nausea and vomiting. EXAM: CT ABDOMEN AND PELVIS WITH CONTRAST TECHNIQUE: Multidetector CT imaging of the abdomen and pelvis was performed using the standard protocol following bolus administration of intravenous contrast. CONTRAST:  ISOVUE-300 IOPAMIDOL (ISOVUE-300) INJECTION 61% COMPARISON:  None. FINDINGS: Lower chest: Mild scarring in the lung bases. Bullous changes in the lung bases. Hepatobiliary: Gallbladder is contracted with edematous and enhancing wall. This is nonspecific but may represent cholecystitis. No definite radiopaque stones. No  bile duct dilatation. No focal liver lesions. Pancreas: Edema surrounding the pancreas, most prominent along the tail. Fluid tracks along the pericolic gutters bilaterally. Changes are consistent with acute pancreatitis. No loculated fluid collections. No pancreatic ductal dilatation. Homogeneous enhancement of the pancreatic parenchyma without evidence of pancreatic necrosis. Spleen: Calcified granulomas in the spleen. Adrenals/Urinary Tract: Adrenal glands are unremarkable. Kidneys are normal, without renal calculi, focal lesion, or hydronephrosis. Bladder is unremarkable. Stomach/Bowel: Stomach is within normal limits. Appendix appears normal. No evidence of bowel wall thickening, distention, or inflammatory changes. Vascular/Lymphatic: Aortic atherosclerosis. No enlarged abdominal or pelvic lymph nodes. Postoperative changes in the left groin with the proximal portion of a left femoral bypass graft demonstrated. 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 Nonocclusive peripheral thrombus. Reproductive: Prostate is unremarkable. Other: No free air in the abdomen. Abdominal wall musculature appears intact. Musculoskeletal: No acute or significant osseous findings. IMPRESSION: 1. Changes of acute pancreatitis with edema and stranding around the pancreas most prominent along the tail. No evidence of abscess or pancreatic necrosis. 2. Gallbladder wall thickening and edema may represent cholecystitis. No stones or bile duct dilatation identified. 3. Postoperative changes in the left groin consistent with a femoral bypass graft. 4. Aortic atherosclerosis. 5. Bullous emphysematous changes in the lung bases. Electronically Signed   By: Burman Nieves M.D.   On: 07/22/2017 06:26   US Abdomen Limited Ruq  Result Date: 07/22/2017 CLINICAL DATA:  Upper abdominal pain with changes of pancreatitis on recent CT EXAM: ULTRASOUND ABDOMEN LIMITED RIGHT UPPER QUADRANT COMPARISON:  CT from earlier in the same  day. FINDINGS: Gallbladder: Wall thickening is noted to 4 mm. Multiple gallstones are identified. No significant pericholecystic fluid is seen. Positive sonographic Eulah Pont sign is elicited. Common bile duct: Diameter: 2 mm. Liver: Diffusely increased in echogenicity consistent with fatty infiltration. No focal mass lesion is noted. Portal vein is patent on color Doppler imaging with normal direction of blood flow towards the liver. IMPRESSION: Changes consistent with acute calculous cholecystitis. Fatty liver. Electronically Signed   By: Alcide Clever M.D.   On: 07/22/2017 08:52    My personal review of EKG: Normal sinus rhythm with APCs, ST depression inferior and lateral leads (of his  chronic and seen on previous EKG)   Assessment & Plan:    Principal Problem:   Acute gallstone pancreatitis  Admit to MedSurg.  Keep strict n.p.o.  Hydration with IV normal saline at 150 cc/h, pain control with as needed morphine.  Supportive care with antiemetics. May have associated alcoholic pancreatitis as well. GI consulted, may need MRCP to be evaluated for CBD stone. Will eventually need cholecystectomy once acute pancreatitis resolved.  Active Problems:   PVD (peripheral vascular disease) with claudication (HCC) Recent left femoral patch angioplasty for stenosis.  On aspirin at home which is on hold given strict n.p.o.  CT shows postoperative changes over the left groin.  Accelerated hypertension Not on any blood pressure medications.  Possibly associated with pain.  Monitor with pain control and add as needed hydralazine.  Tobacco abuse with COPD findings on CT. CT chest shows bullous emphysematous changes.  Counseled strongly on smoking cessation.  Ordered nicotine patch.  Alcohol abuse Counseled strongly on cessation.  No signs of withdrawal at present.  Monitor on CIWA.    Chest pain Atypical and worsened with movement.  Currently asymptomatic.  EKG shows inferior and lateral ST changes which  were present on prior EKG.    DVT Prophylaxis: Subcu Lovenox  AM Labs Ordered, also please review Full Orders  Family Communication: Admission, patients condition and plan of care including tests being ordered have been discussed with the patient at bedside Code Status full code  Likely DC to home  Condition GUARDED    Consults called: GI (Dr. Karilyn Cota)  Admission status: Inpatient  Time spent in minutes : 55   Arryn Terrones M.D on 07/22/2017 at 9:08 AM  Between 7am to 7pm - Pager - [phone]. After 7pm go to [email or web address] - password [facility]  [facility] - Office  [phone]

## 2017-07-22 NOTE — Progress Notes (Signed)
PHARMACY NOTE:  ANTIMICROBIAL RENAL DOSAGE ADJUSTMENT  Current antimicrobial regimen includes a mismatch between antimicrobial dosage and estimated renal function.  As per policy approved by the Pharmacy & Therapeutics and Medical Executive Committees, the antimicrobial dosage will be adjusted accordingly.  Current antimicrobial dosage:  Ceftriaxone 1 gram  Indication: intra-abdominal infection  Renal Function:  Estimated Creatinine Clearance: 64.9 mL/min (by C-G formula based on SCr of 1.11 mg/dL). []      On intermittent HD, scheduled: []      On CRRT    Antimicrobial dosage has been changed to:  Ceftriaxone 2 grams Q24 hours  Additional comments: Per P&T Committee, the dose of cefTRIAXone should be increased to high dose - 2 gm IV q 24 hours for the following conditions:   Intra-abdominal infection  Thank you for allowing pharmacy to be a part of this patient's care.  Loyola MastMidkiff, Dushawn Pusey Scarlett, [facility] HospitalRPH 07/22/2017 11:32 PM

## 2017-07-22 NOTE — Consult Note (Signed)
Referring Provider: Eddie North, MD Primary Care Physician:  Kari Baars, MD Primary Gastroenterologist:  Dr. Karilyn Cota  Reason for Consultation:   Acute pancreatitis and gallstones.  HPI:   Patient is a 62 year old Caucasian male with several year history of drinking excessive amount of alcohol(vodka) who reports to have been drinking more than usual over the last few days.  On the evening of 07/18/2017 he vomited once but did not experience abdominal pain or other symptoms.  A day later or 3 days prior to admission he became sick after eating dinner.  He developed excruciating pain across upper abdomen and vomited at least 6 times.  He did not experience hematemesis.  He had chills but no fever.  His pain continued over the next day and a half.  He was not able to eat or drink anything.  He finally decided to come to emergency room around 3:00 this morning.  Evaluation in the emergency room revealed elevated serum lipase and CT confirmed acute pancreatitis.  Gallbladder wall was thickened.  Bile duct was not dilated.  Ultrasound was performed revealing small gallstones.  Patient was given 2 g of ceftriaxone.  He was hospitalized and begun on IV fluids analgesia and IV famotidine.  MRCP was attempted earlier today but patient could not lie long enough for this test.  Therefore test was not performed. Patient has never been diagnosed with pancreatitis before.  Family history is negative for pancreatitis. He states he has been drinking alcohol off and on for 30 years.  He drinks at least 4 drinks of vodka every night. He is single.  He has never married and does not have any children.  He is disabled.  He lives alone.  He worked for [employer] for more than 10 years.  Prior to that he had multiple jobs.  He is not on disability on account of peripheral vascular disease.  He continues to smoke cigarettes.  Presently he is smoking half a pack per day. Family history is significant for  alcohol abuse in his father who died at age 2.  Mother lived to be 44.   Past Medical History:  Diagnosis Date  . Chronic ankle pain   . Chronic knee pain   . GERD (gastroesophageal reflux disease)   . Hepatitis    Hep A in 1980's  . Hypoglycemia   . Migraines   . Peripheral vascular disease (HCC)   . Recurrent sinus infections   .    Marland Kitchen Shingles     Past Surgical History:  Procedure Laterality Date  . ABDOMINAL AORTOGRAM N/A 04/23/2017   Procedure: ABDOMINAL AORTOGRAM;  Surgeon: Fransisco Hertz, MD;  Location[facility] INVASIVE CV LAB;  Service: Cardiovascular;  Laterality: N/A;  . ENDARTERECTOMY FEMORAL Left 04/28/2017   Procedure: ENDARTERECTOMY LEFT ILIO-FEMORAL ARTERY;  Surgeon: Fransisco Hertz, MD;  Location: [facility] OR;  Service: Vascular;  Laterality: Left;  . FASCIOTOMY  09/06/2011   Procedure: FASCIOTOMY;  Surgeon: Fransisco Hertz, MD;  Location: [facility] OR;  Service: Vascular;  Laterality: Left;  . FEMORAL-TIBIAL BYPASS GRAFT  09/06/2011   Procedure: BYPASS GRAFT FEMORAL-TIBIAL ARTERY;  Surgeon: Fransisco Hertz, MD;  Location: [facility] OR;  Service: Vascular;  Laterality: Left;  . KNEE CARTILAGE SURGERY Left   . LOWER EXTREMITY ANGIOGRAM N/A 09/05/2011   Procedure: LOWER EXTREMITY ANGIOGRAM;  Surgeon: Sherren Kerns, MD;  Location: [facility] CATH LAB;  Service: Cardiovascular;  Laterality: N/A;  . LOWER EXTREMITY ANGIOGRAM Left 04/15/2012   Procedure:  LOWER EXTREMITY ANGIOGRAM;  Surgeon: Fransisco HertzBrian L Chen, MD;  [facility];  Service: Cardiovascular;  Laterality: Left;  . LOWER EXTREMITY ANGIOGRAPHY Bilateral 04/23/2017   Procedure: Lower Extremity Angiography;  Surgeon: Fransisco Hertzhen, Brian L, MD;  [facility];  Service: Cardiovascular;  Laterality: Bilateral;  . PATCH ANGIOPLASTY Left 04/28/2017   Procedure: ILIO-FEMORAL ARTERY PATCH ANGIOPLASTY USING Livia SnellenXENOSURE BIOLOGIC PATCH;  Surgeon: Fransisco Hertzhen, Brian L, MD;  [facility];  Service: Vascular;  Laterality: Left;  . PR VEIN BYPASS GRAFT,AORTO-FEM-POP   09/06/11   Left   . TOOTH EXTRACTION  June-July-Aug. 2015   Several     Prior to Admission medications   Medication Sig Start Date End Date Taking? Authorizing Provider  aspirin EC 81 MG tablet Take 81 mg by mouth daily.   Yes [provider]  cycloSPORINE (RESTASIS) 0.05 % ophthalmic emulsion Place 1 drop into both eyes 2 (two) times daily.   Yes [provider]  divalproex (DEPAKOTE ER) 500 MG 24 hr tablet Take 1 tablet (500 mg total) at bedtime by mouth. 02/11/17  Yes Levert FeinsteinYan, Yijun, MD  eletriptan (RELPAX) 40 MG tablet Take 40 mg by mouth every 2 (two) hours as needed for migraine or headache. May repeat in 2 hours if headache persists or recurs.    Yes [provider]  fluticasone (FLONASE) 50 MCG/ACT nasal spray Place 2 sprays into both nostrils daily.   Yes [provider]  gabapentin (NEURONTIN) 300 MG capsule Take 300 mg by mouth daily.    Yes [provider]  Multiple Vitamin (MULTIVITAMIN WITH MINERALS) TABS tablet Take 1 tablet by mouth daily.   Yes [provider]  naproxen sodium (ALEVE) 220 MG tablet Take 220 mg by mouth 2 (two) times daily as needed (takes 1 tablet every morning and again later if needed for pain). Takes 1 tablet every morning    Yes [provider]  ondansetron (ZOFRAN ODT) 4 MG disintegrating tablet Take 1 tablet (4 mg total) by mouth every 8 (eight) hours as needed. Patient taking differently: Take 4 mg by mouth every 8 (eight) hours as needed for nausea.  12/08/16  Yes Levert FeinsteinYan, Yijun, MD  SUMAtriptan (IMITREX STATDOSE SYSTEM) 6 MG/0.5ML SOAJ Inject 6 mg into the skin as needed. Patient taking differently: Inject 6 mg into the skin as needed.  12/08/16  Yes Levert FeinsteinYan, Yijun, MD  SUMAtriptan (IMITREX) 100 MG tablet Take 1 tablet (100 mg total) by mouth every 2 (two) hours as needed for migraine. May repeat in 2 hours if headache persists or recurs. 12/08/16  Yes Levert FeinsteinYan, Yijun, MD  amoxicillin-clavulanate (AUGMENTIN)  875-125 MG tablet Take 1 tablet by mouth every 12 (twelve) hours. Patient not taking: Reported on 06/03/2017 05/20/17   Fransisco Hertzhen, Brian L, MD  oxyCODONE (OXY IR/ROXICODONE) 5 MG immediate release tablet Take 1 tablet (5 mg total) by mouth every 4 (four) hours as needed for moderate pain. Patient not taking: Reported on 07/22/2017 04/29/17   Lars Mageollins, Emma M, PA-C  oxyCODONE-acetaminophen (PERCOCET/ROXICET) 5-325 MG tablet Take 1 tablet by mouth every 4 (four) hours as needed for severe pain. Patient not taking: Reported on 07/22/2017 05/20/17   Fransisco Hertzhen, Brian L, MD    Current Facility-Administered Medications  Medication Dose Route Frequency Provider Last Rate Last Dose  . 0.9 %  sodium chloride infusion   Intravenous Continuous Malissa Hippoehman, Kalecia Hartney U, MD 200 mL/hr at 07/22/17 1841    . [START ON 07/23/2017] cefTRIAXone (ROCEPHIN) 1 g in sodium chloride  0.9 % 100 mL IVPB  1 g Intravenous Q24H Amadeus Oyama U, MD      . enoxaparin (LOVENOX) injection 40 mg  40 mg Subcutaneous Q24H Dhungel, Nishant, MD   40 mg at 07/22/17 1051  . fluticasone (FLONASE) 50 MCG/ACT nasal spray 2 spray  2 spray Each Nare Daily Dhungel, Nishant, MD   2 spray at 07/22/17 1051  . hydrALAZINE (APRESOLINE) injection 10 mg  10 mg Intravenous Q6H PRN Dhungel, Nishant, MD   10 mg at 07/22/17 1052  . LORazepam (ATIVAN) tablet 1 mg  1 mg Oral Q6H PRN Dhungel, Nishant, MD   1 mg at 07/22/17 2212   Or  . LORazepam (ATIVAN) injection 1 mg  1 mg Intravenous Q6H PRN Dhungel, Nishant, MD   1 mg at 07/22/17 1706  . morphine 2 MG/ML injection 2 mg  2 mg Intravenous Q3H PRN Dhungel, Nishant, MD   2 mg at 07/22/17 2208  . nicotine (NICODERM CQ - dosed in mg/24 hours) patch 21 mg  21 mg Transdermal Daily Dhungel, Nishant, MD   21 mg at 07/22/17 1051  . ondansetron (ZOFRAN) tablet 4 mg  4 mg Oral Q6H PRN Dhungel, Nishant, MD       Or  . ondansetron (ZOFRAN) injection 4 mg  4 mg Intravenous Q6H PRN Dhungel, Nishant, MD   4 mg at 07/22/17 2208  . thiamine  (VITAMIN B-1) tablet 100 mg  100 mg Oral Daily Dhungel, Nishant, MD   100 mg at 07/22/17 1052   Or  . thiamine (B-1) injection 100 mg  100 mg Intravenous Daily Dhungel, Nishant, MD        Allergies as of 07/22/2017  . (No Known Allergies)    Family History  Problem Relation Age of Onset  . Alcohol abuse Father   . Kidney failure Father   . Other Mother        "age"  . Diabetes Unknown     Social History   Socioeconomic History  . Marital status: Single    Spouse name: Not on file  . Number of children: 0  . Years of education: some college  . Highest education level: Not on file  Occupational History  . Occupation: Disabled  Social Needs  . Financial resource strain: Not on file  . Food insecurity:    Worry: Not on file    Inability: Not on file  . Transportation needs:    Medical: Not on file    Non-medical: Not on file  Tobacco Use  . Smoking status: Current Every Day Smoker    Packs/day: 0.50    Years: 30.00    Pack years: 15.00    Types: Cigarettes  . Smokeless tobacco: Never Used  . Tobacco comment: pt states that he is trying his best to quit  Substance and Sexual Activity  . Alcohol use: Yes    Alcohol/week: 0.0 oz    Comment: Few drinks per day  . Drug use: Yes    Types: Marijuana    Comment: occasionally  . Sexual activity: Yes  Lifestyle  . Physical activity:    Days per week: Not on file    Minutes per session: Not on file  . Stress: Not on file  Relationships  . Social connections:    Talks on phone: Not on file    Gets together: Not on file    Attends religious service: Not on file    Active member of club or organization: Not on  file    Attends meetings of clubs or organizations: Not on file    Relationship status: Not on file  . Intimate partner violence:    Fear of current or ex partner: Not on file    Emotionally abused: Not on file    Physically abused: Not on file    Forced sexual activity: Not on file  Other Topics Concern  .  Not on file  Social History Narrative   Lives alone.   Left-handed.   2-3 cups caffeine per day.    Review of Systems: See HPI, otherwise normal ROS  Physical Exam: Temp:  [98.2 F (36.8 C)-98.3 F (36.8 C)] 98.3 F (36.8 C) (04/24 1508) Pulse Rate:  [77-107] 99 (04/24 1800) Resp:  [16-29] 22 (04/24 1800) BP: (148-182)/(96-123) 167/98 (04/24 1800) SpO2:  [92 %-100 %] 92 % (04/24 1800) Weight:  [141 lb (64 kg)-145 lb (65.8 kg)] 141 lb (64 kg) (04/24 0956) Last BM Date: 07/20/17  Well-developed thin Caucasian male who appears to be in some distress. Conjunctiva is pink.  Sclerae nonicteric. Oropharyngeal mucosa is normal.  Few teeth are missing the rest of the dentition in satisfactory condition. No neck masses or thyromegaly noted. Cardiac exam with regular rhythm normal S1 and S2.  No murmur or gallop noted. Lungs are clear to auscultation. Abdomen is symmetrical.  Bowel sounds are normal.  On palpation abdomen is soft.  He has generalized tenderness which is mild.  He has moderate tenderness in mid epigastric region with guarding.  No organomegaly or masses. He has long scar to left lower extremity as well as in left groin. He does not have tremors.    Lab Results: Recent Labs    07/22/17 0422  WBC 16.7*  HGB 18.7*  HCT 54.2*  PLT 150   BMET Recent Labs    07/22/17 0422  NA 134*  K 4.1  CL 93*  CO2 27  GLUCOSE 136*  BUN 13  CREATININE 1.11  CALCIUM 9.2   LFT Recent Labs    07/22/17 0422  PROT 7.7  ALBUMIN 3.6  AST 33  ALT 19  ALKPHOS 130*  BILITOT 1.6*    Studies/Results: Ct Abdomen Pelvis W Contrast  Result Date: 07/22/2017 CLINICAL DATA:  Abdominal pain and bilateral flank pain for 1 day. Nausea and vomiting. EXAM: CT ABDOMEN AND PELVIS WITH CONTRAST TECHNIQUE: Multidetector CT imaging of the abdomen and pelvis was performed using the standard protocol following bolus administration of intravenous contrast. CONTRAST:  ISOVUE-300  IOPAMIDOL (ISOVUE-300) INJECTION 61% COMPARISON:  None. FINDINGS: Lower chest: Mild scarring in the lung bases. Bullous changes in the lung bases. Hepatobiliary: Gallbladder is contracted with edematous and enhancing wall. This is nonspecific but may represent cholecystitis. No definite radiopaque stones. No bile duct dilatation. No focal liver lesions. Pancreas: Edema surrounding the pancreas, most prominent along the tail. Fluid tracks along the pericolic gutters bilaterally. Changes are consistent with acute pancreatitis. No loculated fluid collections. No pancreatic ductal dilatation. Homogeneous enhancement of the pancreatic parenchyma without evidence of pancreatic necrosis. Spleen: Calcified granulomas in the spleen. Adrenals/Urinary Tract: Adrenal glands are unremarkable. Kidneys are normal, without renal calculi, focal lesion, or hydronephrosis. Bladder is unremarkable. Stomach/Bowel: Stomach is within normal limits. Appendix appears normal. No evidence of bowel wall thickening, distention, or inflammatory changes. Vascular/Lymphatic: Aortic atherosclerosis. No enlarged abdominal or pelvic lymph nodes. Postoperative changes in the left groin with the proximal portion of a left femoral bypass graft demonstrated. 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 Nonocclusive peripheral thrombus. Reproductive: Prostate is unremarkable. Other: No free air in the abdomen. Abdominal wall musculature appears intact. Musculoskeletal: No acute or significant osseous findings. IMPRESSION: 1. Changes of acute pancreatitis with edema and stranding around the pancreas most prominent along the tail. No evidence of abscess or pancreatic necrosis. 2. Gallbladder wall thickening and edema may represent cholecystitis. No stones or bile duct dilatation identified. 3. Postoperative changes in the left groin consistent with a femoral bypass graft. 4. Aortic atherosclerosis. 5. Bullous emphysematous changes in the  lung bases. Electronically Signed   By: Burman Nieves M.D.   On: 07/22/2017 06:26   US Abdomen Limited Ruq  Result Date: 07/22/2017 CLINICAL DATA:  Upper abdominal pain with changes of pancreatitis on recent CT EXAM: ULTRASOUND ABDOMEN LIMITED RIGHT UPPER QUADRANT COMPARISON:  CT from earlier in the same day. FINDINGS: Gallbladder: Wall thickening is noted to 4 mm. Multiple gallstones are identified. No significant pericholecystic fluid is seen. Positive sonographic Eulah Pont sign is elicited. Common bile duct: Diameter: 2 mm. Liver: Diffusely increased in echogenicity consistent with fatty infiltration. No focal mass lesion is noted. Portal vein is patent on color Doppler imaging with normal direction of blood flow towards the liver. IMPRESSION: Changes consistent with acute calculous cholecystitis. Fatty liver. Electronically Signed   By: Alcide Clever M.D.   On: 07/22/2017 08:52   CT images and ultrasound reviewed.  Findings as above.  Assessment;  #1. Acute pancreatitis.  Suspect etiology to be alcoholic pancreatitis.  Doubt biliary pancreatitis given normal transaminases and normal sized bile ducts.  Mildly elevated alkaline phosphatase most likely due to alcohol use.  Mildly elevated bilirubin felt to be insignificant.  Bilirubin will be fractionated next time. MRCP was attempted but patient could not tolerate the procedure.  I do not believe there is any indication for MRCP at this time.  May consider repeating ultrasound prior to discharge.  #2. cholelithiasis with gallbladder wall thickening.  Suspect wall thickening is related to extensive peripancreatic edema doubt acute pancreatitis.  Since one cannot be absolutely certain will cover with antibiotic.  He received 2 g of ceftriaxone this morning.  Next dose will be due tomorrow morning.  #3. history of alcohol abuse.  Patient is at risk for alcohol withdrawal and CIWA protocol has been initiated.  Recommendations;  Increase IV fluid rate  to 200 mL/h. Monitor urine output closely. Continue n.p.o. Status. Ceftriaxone 1 g IV every 24 hours starting tomorrow morning. Repeat lab in a.m.   LOS: 0 days   Angeleigh Chiasson  07/22/2017, 11:13 PM

## 2017-07-23 DIAGNOSIS — G43709 Chronic migraine without aura, not intractable, without status migrainosus: Secondary | ICD-10-CM

## 2017-07-23 DIAGNOSIS — K852 Alcohol induced acute pancreatitis without necrosis or infection: Secondary | ICD-10-CM

## 2017-07-23 DIAGNOSIS — K851 Biliary acute pancreatitis without necrosis or infection: Secondary | ICD-10-CM

## 2017-07-23 LAB — COMPREHENSIVE METABOLIC PANEL
ALK PHOS: 73 U/L (ref 38–126)
ALT: 14 U/L — ABNORMAL LOW (ref 17–63)
ANION GAP: 11 (ref 5–15)
AST: 26 U/L (ref 15–41)
Albumin: 2.4 g/dL — ABNORMAL LOW (ref 3.5–5.0)
BILIRUBIN TOTAL: 1.1 mg/dL (ref 0.3–1.2)
BUN: 11 mg/dL (ref 6–20)
CALCIUM: 7.4 mg/dL — AB (ref 8.9–10.3)
CO2: 22 mmol/L (ref 22–32)
Chloride: 101 mmol/L (ref 101–111)
Creatinine, Ser: 0.69 mg/dL (ref 0.61–1.24)
GFR calc non Af Amer: >60 mL/min (ref >60)
Glucose, Bld: 81 mg/dL (ref 65–99)
Potassium: 3.6 mmol/L (ref 3.5–5.1)
SODIUM: 134 mmol/L — AB (ref 135–145)
TOTAL PROTEIN: 5.3 g/dL — AB (ref 6.5–8.1)

## 2017-07-23 LAB — CBC
HCT: 41.4 % (ref 39.0–52.0)
HEMOGLOBIN: 13.9 g/dL (ref 13.0–17.0)
MCH: 33.2 pg (ref 26.0–34.0)
MCHC: 33.6 g/dL (ref 30.0–36.0)
MCV: 98.8 fL (ref 78.0–100.0)
Platelets: 103 10*3/uL — ABNORMAL LOW (ref 150–400)
RBC: 4.19 MIL/uL — AB (ref 4.22–5.81)
RDW: 13.6 % (ref 11.5–15.5)
WBC: 18.4 10*3/uL — ABNORMAL HIGH (ref 4.0–10.5)

## 2017-07-23 LAB — LIPASE, BLOOD: LIPASE: 78 U/L — AB (ref 11–51)

## 2017-07-23 LAB — HIV ANTIBODY (ROUTINE TESTING W REFLEX): HIV Screen 4th Generation wRfx: NONREACTIVE

## 2017-07-23 MED ORDER — SODIUM CHLORIDE 0.9 % IV SOLN
INTRAVENOUS | Status: AC
  Administered 2017-07-23 – 2017-07-24 (×4): via INTRAVENOUS

## 2017-07-23 MED ORDER — DIVALPROEX SODIUM ER 500 MG PO TB24
500.0000 mg | ORAL_TABLET | Freq: Every day | ORAL | Status: DC
Start: 2017-07-23 — End: 2017-07-24
  Administered 2017-07-23: 500 mg via ORAL
  Filled 2017-07-23: qty 1

## 2017-07-23 MED ORDER — SODIUM CHLORIDE 0.9 % IV SOLN
INTRAVENOUS | Status: AC
  Filled 2017-07-23: qty 20

## 2017-07-23 NOTE — Progress Notes (Signed)
  Subjective:  Patient continues to complain of pain across upper abdomen as well as back.  He states pain is no better or worse.  He is getting good relief with pain medication but sometimes he waits too long.  He denies nausea or vomiting.  He did pass flatus but has not had a bowel movement.  He feels he is passing  lot of urine.  Objective: Blood pressure 116/83, pulse 96, temperature 98.3 F (36.8 C), temperature source Oral, resp. rate 18, height 5\' 10"  (1.778 m), weight 141 lb (64 kg), SpO2 98 %. Evette CristalShin is alert and in no acute distress. Conjunctiva is pink. Sclera is nonicteric Oropharyngeal mucosa is normal. No neck masses or thyromegaly noted. Cardiac exam with regular rhythm normal S1 and S2. No murmur or gallop noted. Lungs are clear to auscultation. Abdomen is full.  He has ecchymosis in right lower quadrant.  Bowel sounds are normal.  Abdomen is soft.  He remains with moderate tenderness with guarding and epigastric region right upper quadrant as well as left upper quadrant.  No organomegaly or masses. No LE edema or clubbing noted.  Labs/studies Results:  Recent Labs    07/22/17 0422 07/23/17 0453  WBC 16.7* 18.4*  HGB 18.7* 13.9  HCT 54.2* 41.4  PLT 150 103*    BMET  Recent Labs    07/22/17 0422 07/23/17 0453  NA 134* 134*  K 4.1 3.6  CL 93* 101  CO2 27 22  GLUCOSE 136* 81  BUN 13 11  CREATININE 1.11 0.69  CALCIUM 9.2 7.4*    LFT  Recent Labs    07/22/17 0422 07/23/17 0453  PROT 7.7 5.3*  ALBUMIN 3.6 2.4*  AST 33 26  ALT 19 14*  ALKPHOS 130* 73  BILITOT 1.6* 1.1      Assessment:  #1.  Acute pancreatitis felt to be secondary to alcohol abuse. No symptomatic or objective improvement in the last 24 hours. Remains with reserved renal function.  Bilirubin and alkaline phosphatase normal today.  #2.  Cholelithiasis.  He also has gallbladder wall thickening felt to be due to peripancreatic edema due to pancreatitis and not indicative of  cholecystitis.  Nevertheless he is being covered with ceftriaxone.  He will need cholecystectomy down the road.  #3.  History of migraine.  Patient requested Depakote should be resumed.  Recommendations:  CBC and comprehensive chemistry panel in a.m. Continue IV fluids at a rate of 200 mL/h. Resume Depakote ER at usual dose.

## 2017-07-23 NOTE — Progress Notes (Signed)
Subjective: He says he still has significant abdominal discomfort.  He has some nausea but no vomiting.  No other new complaints.  Objective: Vital signs in last 24 hours: Temp:  [98.2 F (36.8 C)-98.4 F (36.9 C)] 98.4 F (36.9 C) (04/25 0618) Pulse Rate:  [82-107] 92 (04/25 0618) Resp:  [16-22] 18 (04/25 0618) BP: (142-171)/(96-119) 142/99 (04/25 0618) SpO2:  [92 %-100 %] 94 % (04/25 0618) Weight:  [64 kg (141 lb)-64.3 kg (141 lb 12.1 oz)] 64 kg (141 lb) (04/24 0956) Weight change: -1.472 kg (-3 lb 3.9 oz) Last BM Date: 07/21/17  Intake/Output from previous day: 04/24 0701 - 04/25 0700 In: 1057.5 [P.O.:280; I.V.:777.5] Out: 850 [Urine:850]  PHYSICAL EXAM General appearance: alert, cooperative and mild distress Resp: clear to auscultation bilaterally Cardio: regular rate and rhythm, S1, S2 normal, no murmur, click, rub or gallop GI: Still diffuse tenderness most marked in the epigastric region Extremities: extremities normal, atraumatic, no cyanosis or edema  Lab Results:  Results for orders placed or performed during the hospital encounter of 07/22/17 (from the past 48 hour(s))  Comprehensive metabolic panel     Status: Abnormal   Collection Time: 07/22/17  4:22 AM  Result Value Ref Range   Sodium 134 (L) 135 - 145 mmol/L   Potassium 4.1 3.5 - 5.1 mmol/L   Chloride 93 (L) 101 - 111 mmol/L   CO2 27 22 - 32 mmol/L   Glucose, Bld 136 (H) 65 - 99 mg/dL   BUN 13 6 - 20 mg/dL   Creatinine, Ser 1.11 0.61 - 1.24 mg/dL   Calcium 9.2 8.9 - 10.3 mg/dL   Total Protein 7.7 6.5 - 8.1 g/dL   Albumin 3.6 3.5 - 5.0 g/dL   AST 33 15 - 41 U/L   ALT 19 17 - 63 U/L   Alkaline Phosphatase 130 (H) 38 - 126 U/L   Total Bilirubin 1.6 (H) 0.3 - 1.2 mg/dL   GFR calc non Af Amer >60 >60 mL/min   GFR calc Af Amer >60 >60 mL/min    Comment: (NOTE) The eGFR has been calculated using the CKD EPI equation. This calculation has not been validated in all clinical situations. eGFR's persistently  <60 mL/min signify possible Chronic Kidney Disease.    Anion gap 14 5 - 15    Comment: Performed at [facility], [address], [place], [place] [address]  Lipase, blood     Status: Abnormal   Collection Time: 07/22/17  4:22 AM  Result Value Ref Range   Lipase 846 (H) 11 - 51 U/L    Comment: RESULTS CONFIRMED BY MANUAL DILUTION Performed at [facility], [address], [place], [place] [address]   CBC with Differential     Status: Abnormal   Collection Time: 07/22/17  4:22 AM  Result Value Ref Range   WBC 16.7 (H) 4.0 - 10.5 K/uL   RBC 5.52 4.22 - 5.81 MIL/uL   Hemoglobin 18.7 (H) 13.0 - 17.0 g/dL   HCT 54.2 (H) 39.0 - 52.0 %   MCV 98.2 78.0 - 100.0 fL   MCH 33.9 26.0 - 34.0 pg   MCHC 34.5 30.0 - 36.0 g/dL   RDW 13.1 11.5 - 15.5 %   Platelets 150 150 - 400 K/uL   Neutrophils Relative % 84 %   Neutro Abs 13.9 (H) 1.7 - 7.7 K/uL   Lymphocytes Relative 5 %   Lymphs Abs 0.9 0.7 - 4.0 K/uL   Monocytes Relative 11 %   Monocytes  Absolute 1.8 (H) 0.1 - 1.0 K/uL   Eosinophils Relative 0 %   Eosinophils Absolute 0.1 0.0 - 0.7 K/uL   Basophils Relative 0 %   Basophils Absolute 0.0 0.0 - 0.1 K/uL    Comment: Performed at [facility], [address] [place], [place] [address]  Troponin I     Status: None   Collection Time: 07/22/17  4:22 AM  Result Value Ref Range   Troponin I <0.03 <0.03 ng/mL    Comment: Performed at [facility], [address] [place], [place] [address]  HIV antibody (Routine Testing)     Status: None   Collection Time: 07/22/17  4:22 AM  Result Value Ref Range   HIV Screen 4th Generation wRfx Non Reactive Non Reactive    Comment: (NOTE) Performed At: [facility], [place] [id number] Rush Farmer MD VH:[id number] Performed at [facility], [address] [place], [place] [address]   Urinalysis, Routine w reflex microscopic     Status: Abnormal   Collection Time: 07/22/17  5:17 AM  Result Value Ref Range   Color, Urine  AMBER (A) YELLOW    Comment: BIOCHEMICALS MAY BE AFFECTED BY COLOR   APPearance CLOUDY (A) CLEAR   Specific Gravity, Urine 1.028 1.005 - 1.030   pH 6.0 5.0 - 8.0   Glucose, UA NEGATIVE NEGATIVE mg/dL   Hgb urine dipstick NEGATIVE NEGATIVE   Bilirubin Urine SMALL (A) NEGATIVE   Ketones, ur 20 (A) NEGATIVE mg/dL   Protein, ur 100 (A) NEGATIVE mg/dL   Nitrite NEGATIVE NEGATIVE   Leukocytes, UA NEGATIVE NEGATIVE   RBC / HPF 6-10 0 - 5 RBC/hpf   WBC, UA 6-10 0 - 5 WBC/hpf   Bacteria, UA NONE SEEN NONE SEEN   Squamous Epithelial / LPF 0-5 0 - 5    Comment: Please note change in reference range.   Mucus PRESENT    Hyaline Casts, UA PRESENT     Comment: Performed at [facility], [address] [place], [place] [address]  Comprehensive metabolic panel     Status: Abnormal   Collection Time: 07/23/17  4:53 AM  Result Value Ref Range   Sodium 134 (L) 135 - 145 mmol/L   Potassium 3.6 3.5 - 5.1 mmol/L   Chloride 101 101 - 111 mmol/L   CO2 22 22 - 32 mmol/L   Glucose, Bld 81 65 - 99 mg/dL   BUN 11 6 - 20 mg/dL   Creatinine, Ser 0.69 0.61 - 1.24 mg/dL   Calcium 7.4 (L) 8.9 - 10.3 mg/dL   Total Protein 5.3 (L) 6.5 - 8.1 g/dL   Albumin 2.4 (L) 3.5 - 5.0 g/dL   AST 26 15 - 41 U/L   ALT 14 (L) 17 - 63 U/L   Alkaline Phosphatase 73 38 - 126 U/L   Total Bilirubin 1.1 0.3 - 1.2 mg/dL   GFR calc non Af Amer >60 >60 mL/min   GFR calc Af Amer >60 >60 mL/min    Comment: (NOTE) The eGFR has been calculated using the CKD EPI equation. This calculation has not been validated in all clinical situations. eGFR's persistently <60 mL/min signify possible Chronic Kidney Disease.    Anion gap 11 5 - 15    Comment: Performed at [facility], [address], [place], [place] [address]  CBC     Status: Abnormal   Collection Time: 07/23/17  4:53 AM  Result Value Ref Range   WBC 18.4 (H) 4.0 -  10.5 K/uL   RBC 4.19 (L) 4.22 - 5.81 MIL/uL   Hemoglobin 13.9 13.0 - 17.0 g/dL    Comment: DELTA CHECK  NOTED DECREASE DUE TO FLUIDS PER RN    HCT 41.4 39.0 - 52.0 %   MCV 98.8 78.0 - 100.0 fL   MCH 33.2 26.0 - 34.0 pg   MCHC 33.6 30.0 - 36.0 g/dL   RDW 13.6 11.5 - 15.5 %   Platelets 103 (L) 150 - 400 K/uL    Comment: SPECIMEN CHECKED FOR CLOTS PLATELET COUNT CONFIRMED BY SMEAR GIANT PLATELETS SEEN Performed at [facility], [address], [place], [place] [address]   Lipase, blood     Status: Abnormal   Collection Time: 07/23/17  4:53 AM  Result Value Ref Range   Lipase 78 (H) 11 - 51 U/L    Comment: Performed at [facility], [address] [place], [place] [address]    ABGS No results for input(s): PHART, PO2ART, TCO2, HCO3 in the last 72 hours.  Invalid input(s): PCO2 CULTURES No results found for this or any previous visit (from the past 240 hour(s)). Studies/Results: Ct Abdomen Pelvis W Contrast  Result Date: 07/22/2017 CLINICAL DATA:  Abdominal pain and bilateral flank pain for 1 day. Nausea and vomiting. EXAM: CT ABDOMEN AND PELVIS WITH CONTRAST TECHNIQUE: Multidetector CT imaging of the abdomen and pelvis was performed using the standard protocol following bolus administration of intravenous contrast. CONTRAST:  15m ISOVUE-300 IOPAMIDOL (ISOVUE-300) INJECTION 61% COMPARISON:  None. FINDINGS: Lower chest: Mild scarring in the lung bases. Bullous changes in the lung bases. Hepatobiliary: Gallbladder is contracted with edematous and enhancing wall. This is nonspecific but may represent cholecystitis. No definite radiopaque stones. No bile duct dilatation. No focal liver lesions. Pancreas: Edema surrounding the pancreas, most prominent along the tail. Fluid tracks along the pericolic gutters bilaterally. Changes are consistent with acute pancreatitis. No loculated fluid collections. No pancreatic ductal dilatation. Homogeneous enhancement of the pancreatic parenchyma without evidence of pancreatic necrosis. Spleen: Calcified granulomas in the spleen. Adrenals/Urinary Tract: Adrenal  glands are unremarkable. Kidneys are normal, without renal calculi, focal lesion, or hydronephrosis. Bladder is unremarkable. Stomach/Bowel: Stomach is within normal limits. Appendix appears normal. No evidence of bowel wall thickening, distention, or inflammatory changes. Vascular/Lymphatic: Aortic atherosclerosis. No enlarged abdominal or pelvic lymph nodes. Postoperative changes in the left groin with the proximal portion of a left femoral bypass graft demonstrated. 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 Nonocclusive peripheral thrombus. Reproductive: Prostate is unremarkable. Other: No free air in the abdomen. Abdominal wall musculature appears intact. Musculoskeletal: No acute or significant osseous findings. IMPRESSION: 1. Changes of acute pancreatitis with edema and stranding around the pancreas most prominent along the tail. No evidence of abscess or pancreatic necrosis. 2. Gallbladder wall thickening and edema may represent cholecystitis. No stones or bile duct dilatation identified. 3. Postoperative changes in the left groin consistent with a femoral bypass graft. 4. Aortic atherosclerosis. 5. Bullous emphysematous changes in the lung bases. Electronically Signed   By: WLucienne CapersM.D.   On: 07/22/2017 06:26   UKoreaAbdomen Limited Ruq  Result Date: 07/22/2017 CLINICAL DATA:  Upper abdominal pain with changes of pancreatitis on recent CT EXAM: ULTRASOUND ABDOMEN LIMITED RIGHT UPPER QUADRANT COMPARISON:  CT from earlier in the same day. FINDINGS: Gallbladder: Wall thickening is noted to 4 mm. Multiple gallstones are identified. No significant pericholecystic fluid is seen. Positive sonographic MPercell Millersign is elicited. Common bile duct: Diameter: 2 mm. Liver:  Diffusely increased in echogenicity consistent with fatty infiltration. No focal mass lesion is noted. Portal vein is patent on color Doppler imaging with normal direction of blood flow towards the liver. IMPRESSION: Changes  consistent with acute calculous cholecystitis. Fatty liver. Electronically Signed   By: Inez Catalina M.D.   On: 07/22/2017 08:52    Medications:  Prior to Admission:  Medications Prior to Admission  Medication Sig Dispense Refill Last Dose  . aspirin EC 81 MG tablet Take 81 mg by mouth daily.   07/21/2017 at Unknown time  . cycloSPORINE (RESTASIS) 0.05 % ophthalmic emulsion Place 1 drop into both eyes 2 (two) times daily.   07/21/2017 at Unknown time  . divalproex (DEPAKOTE ER) 500 MG 24 hr tablet Take 1 tablet (500 mg total) at bedtime by mouth. 90 tablet 4 Past Week at Unknown time  . eletriptan (RELPAX) 40 MG tablet Take 40 mg by mouth every 2 (two) hours as needed for migraine or headache. May repeat in 2 hours if headache persists or recurs.    Past Week at Unknown time  . fluticasone (FLONASE) 50 MCG/ACT nasal spray Place 2 sprays into both nostrils daily.   07/21/2017 at Unknown time  . gabapentin (NEURONTIN) 300 MG capsule Take 300 mg by mouth daily.    07/21/2017 at Unknown time  . Multiple Vitamin (MULTIVITAMIN WITH MINERALS) TABS tablet Take 1 tablet by mouth daily.   07/21/2017 at Unknown time  . naproxen sodium (ALEVE) 220 MG tablet Take 220 mg by mouth 2 (two) times daily as needed (takes 1 tablet every morning and again later if needed for pain). Takes 1 tablet every morning    07/21/2017 at Unknown time  . ondansetron (ZOFRAN ODT) 4 MG disintegrating tablet Take 1 tablet (4 mg total) by mouth every 8 (eight) hours as needed. (Patient taking differently: Take 4 mg by mouth every 8 (eight) hours as needed for nausea. ) 20 tablet 6 07/21/2017 at Unknown time  . SUMAtriptan (IMITREX STATDOSE SYSTEM) 6 MG/0.5ML SOAJ Inject 6 mg into the skin as needed. (Patient taking differently: Inject 6 mg into the skin as needed. ) 12 Cartridge 11 unknown  . SUMAtriptan (IMITREX) 100 MG tablet Take 1 tablet (100 mg total) by mouth every 2 (two) hours as needed for migraine. May repeat in 2 hours if headache  persists or recurs. 12 tablet 11 unknown  . amoxicillin-clavulanate (AUGMENTIN) 875-125 MG tablet Take 1 tablet by mouth every 12 (twelve) hours. (Patient not taking: Reported on 06/03/2017) 20 tablet 0 Not Taking  . oxyCODONE (OXY IR/ROXICODONE) 5 MG immediate release tablet Take 1 tablet (5 mg total) by mouth every 4 (four) hours as needed for moderate pain. (Patient not taking: Reported on 07/22/2017) 20 tablet 0 Not Taking at Unknown time  . oxyCODONE-acetaminophen (PERCOCET/ROXICET) 5-325 MG tablet Take 1 tablet by mouth every 4 (four) hours as needed for severe pain. (Patient not taking: Reported on 07/22/2017) 20 tablet 0 Not Taking at Unknown time   Scheduled: . enoxaparin (LOVENOX) injection  40 mg Subcutaneous Q24H  . fluticasone  2 spray Each Nare Daily  . nicotine  21 mg Transdermal Daily  . thiamine  100 mg Oral Daily   Or  . thiamine  100 mg Intravenous Daily   Continuous: . sodium chloride 200 mL/hr at 07/23/17 0415  . cefTRIAXone (ROCEPHIN)  IV 2 g (07/23/17 0617)   XBM:[id number], LORazepam **OR** LORazepam, morphine injection, ondansetron **OR** ondansetron (ZOFRAN) IV  Assesment: He has acute pancreatitis.  I agree with Dr. Laural Golden that this is more likely alcoholic pancreatitis although he does have gallstones.  He may be slightly better.  His blood pressure has been up and is better.  He has significant history of alcohol abuse and he is on protocol Principal Problem:   Acute gallstone pancreatitis Active Problems:   PVD (peripheral vascular disease) with claudication (HCC)   Chronic migraine   PAD (peripheral artery disease) ([place])   Acute alcoholic pancreatitis   Hypertensive urgency   Acute pancreatitis    Plan: Continue current treatment    LOS: 1 day   Brin Ruggerio L 07/23/2017, 8:36 AM

## 2017-07-24 LAB — URINE CULTURE
Culture: NO GROWTH
SPECIAL REQUESTS: NORMAL

## 2017-07-24 LAB — CBC
HEMATOCRIT: 42.5 % (ref 39.0–52.0)
HEMOGLOBIN: 13.7 g/dL (ref 13.0–17.0)
MCH: 32.4 pg (ref 26.0–34.0)
MCHC: 32.2 g/dL (ref 30.0–36.0)
MCV: 100.5 fL — ABNORMAL HIGH (ref 78.0–100.0)
Platelets: 118 10*3/uL — ABNORMAL LOW (ref 150–400)
RBC: 4.23 MIL/uL (ref 4.22–5.81)
RDW: 13.6 % (ref 11.5–15.5)
WBC: 15.9 10*3/uL — ABNORMAL HIGH (ref 4.0–10.5)

## 2017-07-24 LAB — COMPREHENSIVE METABOLIC PANEL
ALBUMIN: 2.5 g/dL — AB (ref 3.5–5.0)
ALT: 13 U/L — ABNORMAL LOW (ref 17–63)
ANION GAP: 11 (ref 5–15)
AST: 26 U/L (ref 15–41)
Alkaline Phosphatase: 73 U/L (ref 38–126)
BUN: 17 mg/dL (ref 6–20)
CO2: 19 mmol/L — AB (ref 22–32)
Calcium: 8 mg/dL — ABNORMAL LOW (ref 8.9–10.3)
Chloride: 102 mmol/L (ref 101–111)
Creatinine, Ser: 0.63 mg/dL (ref 0.61–1.24)
GFR calc non Af Amer: >60 mL/min (ref >60)
GLUCOSE: 65 mg/dL (ref 65–99)
POTASSIUM: 3.8 mmol/L (ref 3.5–5.1)
SODIUM: 132 mmol/L — AB (ref 135–145)
Total Bilirubin: 1.1 mg/dL (ref 0.3–1.2)
Total Protein: 5.7 g/dL — ABNORMAL LOW (ref 6.5–8.1)

## 2017-07-24 LAB — MRSA PCR SCREENING: MRSA BY PCR: NEGATIVE

## 2017-07-24 MED ORDER — DILTIAZEM LOAD VIA INFUSION
10.0000 mg | Freq: Once | INTRAVENOUS | Status: DC
  Filled 2017-07-24: qty 10

## 2017-07-24 MED ORDER — KCL IN DEXTROSE-NACL 20-5-0.45 MEQ/L-%-% IV SOLN
INTRAVENOUS | Status: DC
  Administered 2017-07-24: 17:00:00 via INTRAVENOUS

## 2017-07-24 MED ORDER — POTASSIUM CHLORIDE 2 MEQ/ML IV SOLN: INTRAVENOUS | Status: DC

## 2017-07-24 MED ORDER — DILTIAZEM HCL-DEXTROSE 100-5 MG/100ML-% IV SOLN (PREMIX)
5.0000 mg/h | INTRAVENOUS | Status: DC
  Filled 2017-07-24: qty 100

## 2017-07-24 MED ORDER — ASPIRIN EC 81 MG PO TBEC
81.0000 mg | DELAYED_RELEASE_TABLET | Freq: Every day | ORAL | Status: DC
  Administered 2017-07-24: 81 mg via ORAL
  Filled 2017-07-24: qty 1

## 2017-07-24 MED ORDER — POTASSIUM CHLORIDE 2 MEQ/ML IV SOLN
INTRAVENOUS | Status: DC
  Filled 2017-07-24 (×8): qty 1000

## 2017-07-24 NOTE — Progress Notes (Signed)
Received verbal order from Dr. Oliva Bustard to transfer patient to ICU stepdown telemetry and to place pt on a cardizem bolus 10 mg once, then a cardizem drip 10 mg/hr and titrate to maintain HR 65-100.

## 2017-07-24 NOTE — Progress Notes (Signed)
Patient brought down to ICU for Cardizem drip after he was assessed and found to be in Afib RVR 140-160s. Patient was brought to ICU and placed on our monitor where this nurse noticed patient had converted to SR-ST low 100s. Dr. Janna Archondiego made aware and drip was held. Will continue to monitor.

## 2017-07-24 NOTE — Progress Notes (Signed)
  Subjective:  Patient feels better.  He was begun on clear liquids by Dr. Juanetta GoslingHawkins and he is not having any problems.  He states pain has not gotten any worse since he has been on clear liquids.  He denies chest pain or shortness of breath.  Objective: Blood pressure (!) 151/111, pulse (!) 158, temperature 98 F (36.7 C), temperature source Oral, resp. rate 20, height 5\' 10"  (1.778 m), weight 141 lb (64 kg), SpO2 96 %. Examination patient was noted to have heart rate of around 150.  Stat EKG was obtained and he was noted to be in atrial flutter. Lungs are clear to auscultation. Abdomen is full.  Bruising right lower quadrant is unchanged.  Bowel sounds are normal.  He has mild to moderate tenderness in mid epigastrium with some guarding.  No organomegaly or masses.  Labs/studies Results:  Recent Labs    07/22/17 0422 07/23/17 0453 07/24/17 0509  WBC 16.7* 18.4* 15.9*  HGB 18.7* 13.9 13.7  HCT 54.2* 41.4 42.5  PLT 150 103* 118*    BMET  Recent Labs    07/22/17 0422 07/23/17 0453 07/24/17 0509  NA 134* 134* 132*  K 4.1 3.6 3.8  CL 93* 101 102  CO2 27 22 19*  GLUCOSE 136* 81 65  BUN 13 11 17   CREATININE 1.11 0.69 0.63  CALCIUM 9.2 7.4* 8.0*    LFT  Recent Labs    07/22/17 0422 07/23/17 0453 07/24/17 0509  PROT 7.7 5.3* 5.7*  ALBUMIN 3.6 2.4* 2.5*  AST 33 26 26  ALT 19 14* 13*  ALKPHOS 130* 73 73  BILITOT 1.6* 1.1 1.1    Stat EKG shows a flutter with 2-1 block ventricular rate 155/min.  Assessment:  #1.  Alcoholic pancreatitis.  Symptomatically he is improving.  Leukocytosis also improved.  Patient is tolerating clear liquids.  #2.  New onset of atrial flutter with rapid ventricular rate.  Discussed with Dr. Juanetta GoslingHawkins.  Patient to be transferred to ICU and begun on Cardizem infusion.  #3.  Gallstones.  Should consider cholecystectomy at a later date.   Recommendations:  D5 half-normal saline with KCl of 20 mg/L rate of 100 mL/h. Continue clear  liquids.  Dr. Jena Gaussourk to be seen patient over the weekend.

## 2017-07-24 NOTE — Progress Notes (Addendum)
Patient wanting to leave hospital. Was anxious about being moved to the ICU. Explained that he would have to sign out AMA and would need a ride. Dr. Sherryll BurgerShah paged and came to speak with patient. Went over the risks of leaving AMA. Patient still wanting to go. Patient alert and oriented. IV removed, AMA paper signed, belongings given to patient. Patient walked out on his own.

## 2017-07-24 NOTE — Progress Notes (Addendum)
Subjective: He says he feels substantially better this morning.  His pain is better.  He has had bowel movements.  He is not having any nausea or vomiting.  Objective: Vital signs in last 24 hours: Temp:  [98 F (36.7 C)-98.8 F (37.1 C)] 98 F (36.7 C) (04/26 0606) Pulse Rate:  [79-96] 79 (04/26 0606) Resp:  [16-20] 20 (04/26 0606) BP: (116-159)/(83-98) 159/98 (04/26 0606) SpO2:  [92 %-98 %] 92 % (04/26 0606) Weight change:  Last BM Date: 07/21/17  Intake/Output from previous day: 04/25 0701 - 04/26 0700 In: 170 [I.V.:70; IV Piggyback:100] Out: -   PHYSICAL EXAM General appearance: alert, cooperative and no distress Resp: clear to auscultation bilaterally Cardio: regular rate and rhythm, S1, S2 normal, no murmur, click, rub or gallop GI: His abdomen is much less protuberant this morning and less tender.  He has bowel sounds. Extremities: extremities normal, atraumatic, no cyanosis or edema  Lab Results:  Results for orders placed or performed during the hospital encounter of 07/22/17 (from the past 48 hour(s))  Comprehensive metabolic panel     Status: Abnormal   Collection Time: 07/23/17  4:53 AM  Result Value Ref Range   Sodium 134 (L) 135 - 145 mmol/L   Potassium 3.6 3.5 - 5.1 mmol/L   Chloride 101 101 - 111 mmol/L   CO2 22 22 - 32 mmol/L   Glucose, Bld 81 65 - 99 mg/dL   BUN 11 6 - 20 mg/dL   Creatinine, Ser 0.69 0.61 - 1.24 mg/dL   Calcium 7.4 (L) 8.9 - 10.3 mg/dL   Total Protein 5.3 (L) 6.5 - 8.1 g/dL   Albumin 2.4 (L) 3.5 - 5.0 g/dL   AST 26 15 - 41 U/L   ALT 14 (L) 17 - 63 U/L   Alkaline Phosphatase 73 38 - 126 U/L   Total Bilirubin 1.1 0.3 - 1.2 mg/dL   GFR calc non Af Amer >60 >60 mL/min   GFR calc Af Amer >60 >60 mL/min    Comment: (NOTE) The eGFR has been calculated using the CKD EPI equation. This calculation has not been validated in all clinical situations. eGFR's persistently <60 mL/min signify possible Chronic Kidney Disease.    Anion gap 11 5  - 15    Comment: Performed at [facility], [address] [place], [place] [address]  CBC     Status: Abnormal   Collection Time: 07/23/17  4:53 AM  Result Value Ref Range   WBC 18.4 (H) 4.0 - 10.5 K/uL   RBC 4.19 (L) 4.22 - 5.81 MIL/uL   Hemoglobin 13.9 13.0 - 17.0 g/dL    Comment: DELTA CHECK NOTED DECREASE DUE TO FLUIDS PER RN    HCT 41.4 39.0 - 52.0 %   MCV 98.8 78.0 - 100.0 fL   MCH 33.2 26.0 - 34.0 pg   MCHC 33.6 30.0 - 36.0 g/dL   RDW 13.6 11.5 - 15.5 %   Platelets 103 (L) 150 - 400 K/uL    Comment: SPECIMEN CHECKED FOR CLOTS PLATELET COUNT CONFIRMED BY SMEAR GIANT PLATELETS SEEN Performed at [facility], [address] [place], [place] [address]   Lipase, blood     Status: Abnormal   Collection Time: 07/23/17  4:53 AM  Result Value Ref Range   Lipase 78 (H) 11 - 51 U/L    Comment: Performed at [facility], [address], [place], [place] [address]  Comprehensive metabolic panel     Status: Abnormal   Collection Time:  07/24/17  5:09 AM  Result Value Ref Range   Sodium 132 (L) 135 - 145 mmol/L   Potassium 3.8 3.5 - 5.1 mmol/L   Chloride 102 101 - 111 mmol/L   CO2 19 (L) 22 - 32 mmol/L   Glucose, Bld 65 65 - 99 mg/dL   BUN 17 6 - 20 mg/dL   Creatinine, Ser 0.63 0.61 - 1.24 mg/dL   Calcium 8.0 (L) 8.9 - 10.3 mg/dL   Total Protein 5.7 (L) 6.5 - 8.1 g/dL   Albumin 2.5 (L) 3.5 - 5.0 g/dL   AST 26 15 - 41 U/L   ALT 13 (L) 17 - 63 U/L   Alkaline Phosphatase 73 38 - 126 U/L   Total Bilirubin 1.1 0.3 - 1.2 mg/dL   GFR calc non Af Amer >60 >60 mL/min   GFR calc Af Amer >60 >60 mL/min    Comment: (NOTE) The eGFR has been calculated using the CKD EPI equation. This calculation has not been validated in all clinical situations. eGFR's persistently <60 mL/min signify possible Chronic Kidney Disease.    Anion gap 11 5 - 15    Comment: Performed at [facility], [address] [place], [place] [address]  CBC     Status: Abnormal   Collection Time: 07/24/17  5:09 AM   Result Value Ref Range   WBC 15.9 (H) 4.0 - 10.5 K/uL   RBC 4.23 4.22 - 5.81 MIL/uL   Hemoglobin 13.7 13.0 - 17.0 g/dL   HCT 42.5 39.0 - 52.0 %   MCV 100.5 (H) 78.0 - 100.0 fL   MCH 32.4 26.0 - 34.0 pg   MCHC 32.2 30.0 - 36.0 g/dL   RDW 13.6 11.5 - 15.5 %   Platelets 118 (L) 150 - 400 K/uL    Comment: SPECIMEN CHECKED FOR CLOTS CONSISTENT WITH PREVIOUS RESULT Performed at [facility], [address], [place], [place] [address]     ABGS No results for input(s): PHART, PO2ART, TCO2, HCO3 in the last 72 hours.  Invalid input(s): PCO2 CULTURES Recent Results (from the past 240 hour(s))  Urine culture     Status: None   Collection Time: 07/22/17  4:37 AM  Result Value Ref Range Status   Specimen Description   Final    URINE, CLEAN CATCH Performed at [facility], [address] [place], [place] [address]    Special Requests   Final    Normal Performed at [facility], [address] [place], [place] [address]    Culture   Final    NO GROWTH Performed at [facility], [place] [address], [place], [place] [address]    Report Status 07/24/2017 FINAL  Final   Studies/Results: No results found.  Medications:  Prior to Admission:  Medications Prior to Admission  Medication Sig Dispense Refill Last Dose  . aspirin EC 81 MG tablet Take 81 mg by mouth daily.   07/21/2017 at Unknown time  . cycloSPORINE (RESTASIS) 0.05 % ophthalmic emulsion Place 1 drop into both eyes 2 (two) times daily.   07/21/2017 at Unknown time  . divalproex (DEPAKOTE ER) 500 MG 24 hr tablet Take 1 tablet (500 mg total) at bedtime by mouth. 90 tablet 4 Past Week at Unknown time  . eletriptan (RELPAX) 40 MG tablet Take 40 mg by mouth every 2 (two) hours as needed for migraine or headache. May repeat in 2 hours if headache persists or recurs.    Past Week at Unknown time  .  fluticasone (FLONASE) 50 MCG/ACT nasal spray Place 2 sprays into both nostrils daily.   07/21/2017 at Unknown time  . gabapentin  (NEURONTIN) 300 MG capsule Take 300 mg by mouth daily.    07/21/2017 at Unknown time  . Multiple Vitamin (MULTIVITAMIN WITH MINERALS) TABS tablet Take 1 tablet by mouth daily.   07/21/2017 at Unknown time  . naproxen sodium (ALEVE) 220 MG tablet Take 220 mg by mouth 2 (two) times daily as needed (takes 1 tablet every morning and again later if needed for pain). Takes 1 tablet every morning    07/21/2017 at Unknown time  . ondansetron (ZOFRAN ODT) 4 MG disintegrating tablet Take 1 tablet (4 mg total) by mouth every 8 (eight) hours as needed. (Patient taking differently: Take 4 mg by mouth every 8 (eight) hours as needed for nausea. ) 20 tablet 6 07/21/2017 at Unknown time  . SUMAtriptan (IMITREX STATDOSE SYSTEM) 6 MG/0.5ML SOAJ Inject 6 mg into the skin as needed. (Patient taking differently: Inject 6 mg into the skin as needed. ) 12 Cartridge 11 unknown  . SUMAtriptan (IMITREX) 100 MG tablet Take 1 tablet (100 mg total) by mouth every 2 (two) hours as needed for migraine. May repeat in 2 hours if headache persists or recurs. 12 tablet 11 unknown  . amoxicillin-clavulanate (AUGMENTIN) 875-125 MG tablet Take 1 tablet by mouth every 12 (twelve) hours. (Patient not taking: Reported on 06/03/2017) 20 tablet 0 Not Taking  . oxyCODONE (OXY IR/ROXICODONE) 5 MG immediate release tablet Take 1 tablet (5 mg total) by mouth every 4 (four) hours as needed for moderate pain. (Patient not taking: Reported on 07/22/2017) 20 tablet 0 Not Taking at Unknown time  . oxyCODONE-acetaminophen (PERCOCET/ROXICET) 5-325 MG tablet Take 1 tablet by mouth every 4 (four) hours as needed for severe pain. (Patient not taking: Reported on 07/22/2017) 20 tablet 0 Not Taking at Unknown time   Scheduled: . divalproex  500 mg Oral QHS  . enoxaparin (LOVENOX) injection  40 mg Subcutaneous Q24H  . fluticasone  2 spray Each Nare Daily  . nicotine  21 mg Transdermal Daily  . thiamine  100 mg Oral Daily   Or  . thiamine  100 mg Intravenous Daily    Continuous: . sodium chloride 200 mL/hr at 07/24/17 0545  . cefTRIAXone (ROCEPHIN)  IV Stopped (07/24/17 0700)   [id number], LORazepam **OR** LORazepam, morphine injection, ondansetron **OR** ondansetron (ZOFRAN) IV  Assesment: He has acute pancreatitis likely from alcohol.  He does also have gallstones.  He will need cholecystectomy in the future.  He is better today.  He has chronic alcohol abuse and he is on withdrawal protocol  He has peripheral arterial disease status post surgical intervention which is stable  He had significant hypertensive urgency and that is better I think because his pain is better controlled  He has ongoing nicotine abuse and he is on nicotine patch  He has seizure disorder and he is back on his medication Principal Problem:   Acute gallstone pancreatitis Active Problems:   PVD (peripheral vascular disease) with claudication (HCC)   Chronic migraine   PAD (peripheral artery disease) ([place])   Acute alcoholic pancreatitis   Hypertensive urgency   Acute pancreatitis    Plan: Clear liquid diet continue other treatments    LOS: 2 days   Audyn Dimercurio L 07/24/2017, 8:34 AM

## 2017-07-24 NOTE — Progress Notes (Signed)
Patient has decided to leave AMA as he states that he is upset with his care.  He has been admitted with alcoholic and gallstone pancreatitis and is currently tolerating clear liquids.  He is currently stable and is getting dressed to leave.  He is alert and oriented x3 to person place and time.  He will sign AMA paperwork and understands that we will not arrange for any discharge medications or follow-up care.  He understands the medical risk that he is undertaking by leaving AMA at this time and acknowledges that he may have to come back to the ED.

## 2017-07-24 NOTE — Progress Notes (Signed)
Received verbal order from Dr. Juanetta GoslingHawkins to order 81 mg aspirin daily for pt.

## 2017-07-28 NOTE — Discharge Summary (Signed)
Physician Discharge Summary  Patient ID: Brady Bell MRN: [id number] DOB/AGE: [birth year] 60 y.o. Primary Care Physician:Thermon Zulauf, Ramon Dredge, MD Admit date: 07/22/2017 Discharge date: 07/28/2017    Discharge Diagnoses:   Acute alcoholic pancreatitis Chronic alcoholism COPD Peripheral arterial disease Anxiety Migraine headaches Seizure disorder Malignant hypertension Cholelithiasis Atrial flutter  Allergies as of 07/24/2017   No Known Allergies     Medication List    ASK your doctor about these medications   amoxicillin-clavulanate 875-125 MG tablet Commonly known as:  AUGMENTIN Take 1 tablet by mouth every 12 (twelve) hours.   aspirin EC 81 MG tablet Take 81 mg by mouth daily.   cycloSPORINE 0.05 % ophthalmic emulsion Commonly known as:  RESTASIS Place 1 drop into both eyes 2 (two) times daily.   divalproex 500 MG 24 hr tablet Commonly known as:  DEPAKOTE ER Take 1 tablet (500 mg total) at bedtime by mouth.   eletriptan 40 MG tablet Commonly known as:  RELPAX Take 40 mg by mouth every 2 (two) hours as needed for migraine or headache. May repeat in 2 hours if headache persists or recurs.   fluticasone 50 MCG/ACT nasal spray Commonly known as:  FLONASE Place 2 sprays into both nostrils daily.   gabapentin 300 MG capsule Commonly known as:  NEURONTIN Take 300 mg by mouth daily.   multivitamin with minerals Tabs tablet Take 1 tablet by mouth daily.   naproxen sodium 220 MG tablet Commonly known as:  ALEVE Take 220 mg by mouth 2 (two) times daily as needed (takes 1 tablet every morning and again later if needed for pain). Takes 1 tablet every morning   ondansetron 4 MG disintegrating tablet Commonly known as:  ZOFRAN ODT Take 1 tablet (4 mg total) by mouth every 8 (eight) hours as needed.   oxyCODONE 5 MG immediate release tablet Commonly known as:  Oxy IR/ROXICODONE Take 1 tablet (5 mg total) by mouth every 4 (four) hours as needed for moderate pain.    oxyCODONE-acetaminophen 5-325 MG tablet Commonly known as:  PERCOCET/ROXICET Take 1 tablet by mouth every 4 (four) hours as needed for severe pain.   SUMAtriptan 6 MG/0.5ML Soaj Commonly known as:  IMITREX STATDOSE SYSTEM Inject 6 mg into the skin as needed.   SUMAtriptan 100 MG tablet Commonly known as:  IMITREX Take 1 tablet (100 mg total) by mouth every 2 (two) hours as needed for migraine. May repeat in 2 hours if headache persists or recurs.       Discharged Condition: Improved    Consults: Gastroenterology, Dr. Karilyn Cota  Significant Diagnostic Studies: Ct Abdomen Pelvis W Contrast  Result Date: 07/22/2017 CLINICAL DATA:  Abdominal pain and bilateral flank pain for 1 day. Nausea and vomiting. EXAM: CT ABDOMEN AND PELVIS WITH CONTRAST TECHNIQUE: Multidetector CT imaging of the abdomen and pelvis was performed using the standard protocol following bolus administration of intravenous contrast. CONTRAST:  ISOVUE-300 IOPAMIDOL (ISOVUE-300) INJECTION 61% COMPARISON:  None. FINDINGS: Lower chest: Mild scarring in the lung bases. Bullous changes in the lung bases. Hepatobiliary: Gallbladder is contracted with edematous and enhancing wall. This is nonspecific but may represent cholecystitis. No definite radiopaque stones. No bile duct dilatation. No focal liver lesions. Pancreas: Edema surrounding the pancreas, most prominent along the tail. Fluid tracks along the pericolic gutters bilaterally. Changes are consistent with acute pancreatitis. No loculated fluid collections. No pancreatic ductal dilatation. Homogeneous enhancement of the pancreatic parenchyma without evidence of pancreatic necrosis. Spleen: Calcified granulomas in the spleen. Adrenals/Urinary Tract: Adrenal  glands are unremarkable. Kidneys are normal, without renal calculi, focal lesion, or hydronephrosis. Bladder is unremarkable. Stomach/Bowel: Stomach is within normal limits. Appendix appears normal. No evidence of bowel  wall thickening, distention, or inflammatory changes. Vascular/Lymphatic: Aortic atherosclerosis. No enlarged abdominal or pelvic lymph nodes. Postoperative changes in the left groin with the proximal portion of a left femoral bypass graft demonstrated. 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 Nonocclusive peripheral thrombus. Reproductive: Prostate is unremarkable. Other: No free air in the abdomen. Abdominal wall musculature appears intact. Musculoskeletal: No acute or significant osseous findings. IMPRESSION: 1. Changes of acute pancreatitis with edema and stranding around the pancreas most prominent along the tail. No evidence of abscess or pancreatic necrosis. 2. Gallbladder wall thickening and edema may represent cholecystitis. No stones or bile duct dilatation identified. 3. Postoperative changes in the left groin consistent with a femoral bypass graft. 4. Aortic atherosclerosis. 5. Bullous emphysematous changes in the lung bases. Electronically Signed   By: Burman Nieves M.D.   On: 07/22/2017 06:26   US Abdomen Limited Ruq  Result Date: 07/22/2017 CLINICAL DATA:  Upper abdominal pain with changes of pancreatitis on recent CT EXAM: ULTRASOUND ABDOMEN LIMITED RIGHT UPPER QUADRANT COMPARISON:  CT from earlier in the same day. FINDINGS: Gallbladder: Wall thickening is noted to 4 mm. Multiple gallstones are identified. No significant pericholecystic fluid is seen. Positive sonographic Eulah Pont sign is elicited. Common bile duct: Diameter: 2 mm. Liver: Diffusely increased in echogenicity consistent with fatty infiltration. No focal mass lesion is noted. Portal vein is patent on color Doppler imaging with normal direction of blood flow towards the liver. IMPRESSION: Changes consistent with acute calculous cholecystitis. Fatty liver. Electronically Signed   By: Alcide Clever M.D.   On: 07/22/2017 08:52    Lab Results: Basic Metabolic Panel: No results for input(s): NA, K, CL, CO2,  GLUCOSE, BUN, CREATININE, CALCIUM, MG, PHOS in the last 72 hours. Liver Function Tests: No results for input(s): AST, ALT, ALKPHOS, BILITOT, PROT, ALBUMIN in the last 72 hours.   CBC: No results for input(s): WBC, NEUTROABS, HGB, HCT, MCV, PLT in the last 72 hours.  Recent Results (from the past 240 hour(s))  Urine culture     Status: None   Collection Time: 07/22/17  4:37 AM  Result Value Ref Range Status   Specimen Description   Final    URINE, CLEAN CATCH Performed at [facility], [address], [place], [place] [address]    Special Requests   Final    Normal Performed at [facility], [address], [place], [place] [address]    Culture   Final    NO GROWTH Performed at [facility], [address], [place], [place] [address]    Report Status 07/24/2017 FINAL  Final  MRSA PCR Screening     Status: None   Collection Time: 07/24/17 10:20 PM  Result Value Ref Range Status   MRSA by PCR NEGATIVE NEGATIVE Final    Comment:        The GeneXpert MRSA Assay (FDA approved for NASAL specimens only), is one component of a comprehensive MRSA colonization surveillance program. It is not intended to diagnose MRSA infection nor to guide or monitor treatment for MRSA infections. Performed at [facility], [address], [place], [place] [address]      Hospital Course: This is a 62 year old came to the emergency department with nausea vomiting and abdominal pain.  He was found to have pancreatitis.  He  had gallstones but has significant alcohol intake and although initially it was felt that it might be gallstone pancreatitis it was later felt much more likely that it was alcoholic pancreatitis.  He was treated with IV fluids pain medications and was improving.  He was able to eat.  However he developed atrial flutter and is being transferred to the stepdown unit.  He received diltiazem.  He became very anxious but actually converted to sinus rhythm and then checked out  AMA.  Discharge Exam: Blood pressure (!) 151/111, pulse (!) 158, temperature 98 F (36.7 C), temperature source Oral, resp. rate 20, height  (1.778 m), weight 64 kg (141 lb), SpO2 96 %. Awake and alert.  Anxious in sinus rhythm now  Disposition: Home      Signed: Tonee Silverstein L   07/28/2017, 8:15 AM

## 2017-07-29 ENCOUNTER — Inpatient Hospital Stay (HOSPITAL_COMMUNITY)
Admission: EM | Admit: 2017-07-29 | Discharge: 2017-08-03 | DRG: 439 | Disposition: A | Discharge status: Home / Self Care | Payer: Medicaid Other | Attending: Pulmonary Disease | Admitting: Pulmonary Disease

## 2017-07-29 ENCOUNTER — Encounter (HOSPITAL_COMMUNITY): Payer: Self-pay | Admitting: Emergency Medicine

## 2017-07-29 ENCOUNTER — Other Ambulatory Visit: Payer: Self-pay

## 2017-07-29 DIAGNOSIS — F17210 Nicotine dependence, cigarettes, uncomplicated: Secondary | ICD-10-CM | POA: Diagnosis present

## 2017-07-29 DIAGNOSIS — J439 Emphysema, unspecified: Secondary | ICD-10-CM | POA: Diagnosis present

## 2017-07-29 DIAGNOSIS — I471 Supraventricular tachycardia: Secondary | ICD-10-CM | POA: Diagnosis present

## 2017-07-29 DIAGNOSIS — K861 Other chronic pancreatitis: Secondary | ICD-10-CM | POA: Diagnosis present

## 2017-07-29 DIAGNOSIS — K810 Acute cholecystitis: Secondary | ICD-10-CM | POA: Diagnosis not present

## 2017-07-29 DIAGNOSIS — K219 Gastro-esophageal reflux disease without esophagitis: Secondary | ICD-10-CM | POA: Diagnosis present

## 2017-07-29 DIAGNOSIS — Z811 Family history of alcohol abuse and dependence: Secondary | ICD-10-CM | POA: Diagnosis not present

## 2017-07-29 DIAGNOSIS — Z841 Family history of disorders of kidney and ureter: Secondary | ICD-10-CM

## 2017-07-29 DIAGNOSIS — J9690 Respiratory failure, unspecified, unspecified whether with hypoxia or hypercapnia: Secondary | ICD-10-CM

## 2017-07-29 DIAGNOSIS — Z599 Problem related to housing and economic circumstances, unspecified: Secondary | ICD-10-CM

## 2017-07-29 DIAGNOSIS — I10 Essential (primary) hypertension: Secondary | ICD-10-CM | POA: Diagnosis present

## 2017-07-29 DIAGNOSIS — Z7982 Long term (current) use of aspirin: Secondary | ICD-10-CM

## 2017-07-29 DIAGNOSIS — F1010 Alcohol abuse, uncomplicated: Secondary | ICD-10-CM | POA: Diagnosis not present

## 2017-07-29 DIAGNOSIS — K852 Alcohol induced acute pancreatitis without necrosis or infection: Principal | ICD-10-CM | POA: Diagnosis present

## 2017-07-29 DIAGNOSIS — K8000 Calculus of gallbladder with acute cholecystitis without obstruction: Secondary | ICD-10-CM | POA: Diagnosis not present

## 2017-07-29 DIAGNOSIS — I4892 Unspecified atrial flutter: Secondary | ICD-10-CM | POA: Diagnosis present

## 2017-07-29 DIAGNOSIS — R1084 Generalized abdominal pain: Secondary | ICD-10-CM

## 2017-07-29 DIAGNOSIS — R000 Tachycardia, unspecified: Secondary | ICD-10-CM | POA: Diagnosis not present

## 2017-07-29 DIAGNOSIS — K8010 Calculus of gallbladder with chronic cholecystitis without obstruction: Secondary | ICD-10-CM

## 2017-07-29 DIAGNOSIS — E876 Hypokalemia: Secondary | ICD-10-CM | POA: Diagnosis present

## 2017-07-29 DIAGNOSIS — G40909 Epilepsy, unspecified, not intractable, without status epilepticus: Secondary | ICD-10-CM | POA: Diagnosis present

## 2017-07-29 DIAGNOSIS — Z720 Tobacco use: Secondary | ICD-10-CM | POA: Diagnosis not present

## 2017-07-29 DIAGNOSIS — F419 Anxiety disorder, unspecified: Secondary | ICD-10-CM | POA: Diagnosis present

## 2017-07-29 DIAGNOSIS — I739 Peripheral vascular disease, unspecified: Secondary | ICD-10-CM | POA: Diagnosis present

## 2017-07-29 LAB — COMPREHENSIVE METABOLIC PANEL
ALT: 17 U/L (ref 17–63)
AST: 30 U/L (ref 15–41)
Albumin: 2.9 g/dL — ABNORMAL LOW (ref 3.5–5.0)
Alkaline Phosphatase: 98 U/L (ref 38–126)
Anion gap: 16 — ABNORMAL HIGH (ref 5–15)
BUN: 8 mg/dL (ref 6–20)
CO2: 29 mmol/L (ref 22–32)
Calcium: 8.9 mg/dL (ref 8.9–10.3)
Chloride: 92 mmol/L — ABNORMAL LOW (ref 101–111)
Creatinine, Ser: 0.7 mg/dL (ref 0.61–1.24)
GFR calc Af Amer: >60 mL/min (ref >60)
GFR calc non Af Amer: >60 mL/min (ref >60)
Glucose, Bld: 108 mg/dL — ABNORMAL HIGH (ref 65–99)
Potassium: 2.1 mmol/L — CL (ref 3.5–5.1)
Sodium: 137 mmol/L (ref 135–145)
Total Bilirubin: 0.7 mg/dL (ref 0.3–1.2)
Total Protein: 6.7 g/dL (ref 6.5–8.1)

## 2017-07-29 LAB — CBC WITH DIFFERENTIAL/PLATELET
Basophils Absolute: 0.1 10*3/uL (ref 0.0–0.1)
Basophils Relative: 1 %
Eosinophils Absolute: 0.2 10*3/uL (ref 0.0–0.7)
Eosinophils Relative: 2 %
HCT: 41.8 % (ref 39.0–52.0)
Hemoglobin: 14.2 g/dL (ref 13.0–17.0)
Lymphocytes Relative: 15 %
Lymphs Abs: 1.4 10*3/uL (ref 0.7–4.0)
MCH: 32.9 pg (ref 26.0–34.0)
MCHC: 34 g/dL (ref 30.0–36.0)
MCV: 96.8 fL (ref 78.0–100.0)
Monocytes Absolute: 1.7 10*3/uL — ABNORMAL HIGH (ref 0.1–1.0)
Monocytes Relative: 19 %
Neutro Abs: 5.6 10*3/uL (ref 1.7–7.7)
Neutrophils Relative %: 63 %
Platelets: 348 10*3/uL (ref 150–400)
RBC: 4.32 MIL/uL (ref 4.22–5.81)
RDW: 13.4 % (ref 11.5–15.5)
WBC: 8.9 10*3/uL (ref 4.0–10.5)

## 2017-07-29 LAB — URINALYSIS, ROUTINE W REFLEX MICROSCOPIC
Bilirubin Urine: NEGATIVE
Glucose, UA: NEGATIVE mg/dL
Hgb urine dipstick: NEGATIVE
Ketones, ur: 5 mg/dL — AB
Leukocytes, UA: NEGATIVE
Nitrite: NEGATIVE
Protein, ur: NEGATIVE mg/dL
Specific Gravity, Urine: 1.006 (ref 1.005–1.030)
pH: 7 (ref 5.0–8.0)

## 2017-07-29 LAB — LIPASE, BLOOD: Lipase: 151 U/L — ABNORMAL HIGH (ref 11–51)

## 2017-07-29 LAB — PROTIME-INR
INR: 1.03
Prothrombin Time: 13.4 seconds (ref 11.4–15.2)

## 2017-07-29 LAB — MAGNESIUM: Magnesium: 1.8 mg/dL (ref 1.7–2.4)

## 2017-07-29 LAB — APTT: APTT: 37 s — AB (ref 24–36)

## 2017-07-29 MED ORDER — HEPARIN (PORCINE) IN NACL 100-0.45 UNIT/ML-% IJ SOLN
1400.0000 [IU]/h | INTRAMUSCULAR | Status: DC
  Administered 2017-07-29: 1000 [IU]/h via INTRAVENOUS
  Administered 2017-07-30: 1200 [IU]/h via INTRAVENOUS
  Administered 2017-07-31: 1400 [IU]/h via INTRAVENOUS
  Filled 2017-07-29 (×3): qty 250

## 2017-07-29 MED ORDER — MAGNESIUM SULFATE 2 GM/50ML IV SOLN
2.0000 g | Freq: Once | INTRAVENOUS | Status: AC
  Administered 2017-07-29: 2 g via INTRAVENOUS
  Filled 2017-07-29: qty 50

## 2017-07-29 MED ORDER — MORPHINE SULFATE (PF) 4 MG/ML IV SOLN
4.0000 mg | Freq: Once | INTRAVENOUS | Status: AC
  Administered 2017-07-29: 4 mg via INTRAVENOUS
  Filled 2017-07-29: qty 1

## 2017-07-29 MED ORDER — LORAZEPAM 2 MG/ML IJ SOLN
1.0000 mg | Freq: Four times a day (QID) | INTRAMUSCULAR | Status: AC | PRN
  Administered 2017-07-30 (×3): 1 mg via INTRAVENOUS
  Filled 2017-07-29 (×3): qty 1

## 2017-07-29 MED ORDER — ADULT MULTIVITAMIN W/MINERALS CH
1.0000 | ORAL_TABLET | Freq: Every day | ORAL | Status: DC
  Filled 2017-07-29: qty 1

## 2017-07-29 MED ORDER — DIVALPROEX SODIUM ER 500 MG PO TB24
750.0000 mg | ORAL_TABLET | Freq: Every day | ORAL | Status: DC
  Administered 2017-07-29 – 2017-08-02 (×5): 750 mg via ORAL
  Filled 2017-07-29 (×7): qty 1

## 2017-07-29 MED ORDER — METOPROLOL TARTRATE 5 MG/5ML IV SOLN
5.0000 mg | Freq: Once | INTRAVENOUS | Status: AC
  Administered 2017-07-29: 5 mg via INTRAVENOUS
  Filled 2017-07-29: qty 5

## 2017-07-29 MED ORDER — CYCLOSPORINE 0.05 % OP EMUL
1.0000 [drp] | Freq: Two times a day (BID) | OPHTHALMIC | Status: DC
  Administered 2017-07-31 – 2017-08-03 (×7): 1 [drp] via OPHTHALMIC
  Filled 2017-07-29 (×17): qty 1

## 2017-07-29 MED ORDER — POTASSIUM CHLORIDE 10 MEQ/100ML IV SOLN: 10.0000 meq | Freq: Once | INTRAVENOUS | Status: DC

## 2017-07-29 MED ORDER — GABAPENTIN 300 MG PO CAPS
300.0000 mg | ORAL_CAPSULE | Freq: Every day | ORAL | Status: DC
Start: 2017-07-29 — End: 2017-08-03
  Administered 2017-07-30 – 2017-08-03 (×5): 300 mg via ORAL
  Filled 2017-07-29 (×6): qty 1

## 2017-07-29 MED ORDER — ADENOSINE 6 MG/2ML IV SOLN: 6.0000 mg | Freq: Once | INTRAVENOUS | Status: DC

## 2017-07-29 MED ORDER — SODIUM CHLORIDE 0.9 % IV SOLN
INTRAVENOUS | Status: DC
  Administered 2017-07-29 – 2017-08-01 (×4): via INTRAVENOUS

## 2017-07-29 MED ORDER — ONDANSETRON HCL 4 MG/2ML IJ SOLN: 4.0000 mg | Freq: Four times a day (QID) | INTRAMUSCULAR | Status: DC | PRN

## 2017-07-29 MED ORDER — HYDRALAZINE HCL 25 MG PO TABS: 25.0000 mg | ORAL_TABLET | Freq: Four times a day (QID) | ORAL | Status: DC | PRN

## 2017-07-29 MED ORDER — NICOTINE 21 MG/24HR TD PT24
21.0000 mg | MEDICATED_PATCH | Freq: Once | TRANSDERMAL | Status: AC
  Administered 2017-07-29: 21 mg via TRANSDERMAL
  Filled 2017-07-29: qty 1

## 2017-07-29 MED ORDER — LORAZEPAM 1 MG PO TABS
1.0000 mg | ORAL_TABLET | Freq: Four times a day (QID) | ORAL | Status: AC | PRN
  Administered 2017-07-31 – 2017-08-01 (×3): 1 mg via ORAL
  Filled 2017-07-29 (×3): qty 1

## 2017-07-29 MED ORDER — THIAMINE HCL 100 MG/ML IJ SOLN: 100.0000 mg | Freq: Every day | INTRAMUSCULAR | Status: DC

## 2017-07-29 MED ORDER — ADULT MULTIVITAMIN W/MINERALS CH
1.0000 | ORAL_TABLET | Freq: Every day | ORAL | Status: DC
  Administered 2017-07-30 – 2017-08-03 (×5): 1 via ORAL
  Filled 2017-07-29 (×5): qty 1

## 2017-07-29 MED ORDER — ASPIRIN EC 81 MG PO TBEC
81.0000 mg | DELAYED_RELEASE_TABLET | Freq: Every day | ORAL | Status: DC
  Administered 2017-07-30 – 2017-08-03 (×5): 81 mg via ORAL
  Filled 2017-07-29 (×6): qty 1

## 2017-07-29 MED ORDER — ONDANSETRON HCL 4 MG PO TABS
4.0000 mg | ORAL_TABLET | Freq: Four times a day (QID) | ORAL | Status: DC | PRN
Start: 2017-07-29 — End: 2017-08-03

## 2017-07-29 MED ORDER — FOLIC ACID 1 MG PO TABS
1.0000 mg | ORAL_TABLET | Freq: Every day | ORAL | Status: DC
  Administered 2017-07-29 – 2017-08-03 (×6): 1 mg via ORAL
  Filled 2017-07-29 (×6): qty 1

## 2017-07-29 MED ORDER — ACETAMINOPHEN 325 MG PO TABS: 650.0000 mg | ORAL_TABLET | Freq: Four times a day (QID) | ORAL | Status: DC | PRN

## 2017-07-29 MED ORDER — HEPARIN BOLUS VIA INFUSION
4000.0000 [IU] | Freq: Once | INTRAVENOUS | Status: AC
  Administered 2017-07-29: 4000 [IU] via INTRAVENOUS

## 2017-07-29 MED ORDER — VITAMIN B-1 100 MG PO TABS
100.0000 mg | ORAL_TABLET | Freq: Every day | ORAL | Status: DC
  Administered 2017-07-29 – 2017-08-03 (×6): 100 mg via ORAL
  Filled 2017-07-29 (×6): qty 1

## 2017-07-29 MED ORDER — ADENOSINE 6 MG/2ML IV SOLN
INTRAVENOUS | Status: AC
  Filled 2017-07-29: qty 8

## 2017-07-29 MED ORDER — POTASSIUM CHLORIDE 10 MEQ/100ML IV SOLN
10.0000 meq | Freq: Once | INTRAVENOUS | Status: AC
  Administered 2017-07-29: 10 meq via INTRAVENOUS
  Filled 2017-07-29: qty 100

## 2017-07-29 MED ORDER — ENOXAPARIN SODIUM 40 MG/0.4ML ~~LOC~~ SOLN: 40.0000 mg | SUBCUTANEOUS | Status: DC

## 2017-07-29 MED ORDER — DILTIAZEM HCL 30 MG PO TABS
30.0000 mg | ORAL_TABLET | Freq: Four times a day (QID) | ORAL | Status: DC
  Administered 2017-07-29 – 2017-07-31 (×6): 30 mg via ORAL
  Filled 2017-07-29 (×6): qty 1

## 2017-07-29 MED ORDER — FLUTICASONE PROPIONATE 50 MCG/ACT NA SUSP
2.0000 | Freq: Every day | NASAL | Status: DC
  Administered 2017-07-30 – 2017-08-03 (×5): 2 via NASAL
  Filled 2017-07-29 (×3): qty 16

## 2017-07-29 MED ORDER — DILTIAZEM HCL-DEXTROSE 100-5 MG/100ML-% IV SOLN (PREMIX)
5.0000 mg/h | Freq: Once | INTRAVENOUS | Status: AC
  Administered 2017-07-29: 5 mg/h via INTRAVENOUS
  Filled 2017-07-29: qty 100

## 2017-07-29 MED ORDER — POTASSIUM CHLORIDE CRYS ER 20 MEQ PO TBCR
60.0000 meq | EXTENDED_RELEASE_TABLET | Freq: Once | ORAL | Status: AC
Start: 2017-07-29 — End: 2017-07-29
  Administered 2017-07-29: 60 meq via ORAL
  Filled 2017-07-29: qty 3

## 2017-07-29 MED ORDER — MORPHINE SULFATE (PF) 2 MG/ML IV SOLN
1.0000 mg | INTRAVENOUS | Status: DC | PRN
  Administered 2017-07-29 – 2017-08-03 (×19): 1 mg via INTRAVENOUS
  Filled 2017-07-29 (×22): qty 1

## 2017-07-29 MED ORDER — ACETAMINOPHEN 650 MG RE SUPP: 650.0000 mg | Freq: Four times a day (QID) | RECTAL | Status: DC | PRN

## 2017-07-29 MED ORDER — POTASSIUM CHLORIDE 10 MEQ/100ML IV SOLN
10.0000 meq | INTRAVENOUS | Status: AC
  Administered 2017-07-29 (×2): 10 meq via INTRAVENOUS
  Filled 2017-07-29 (×2): qty 100

## 2017-07-29 MED ORDER — SODIUM CHLORIDE 0.9 % IV SOLN
INTRAVENOUS | Status: DC
Start: 2017-07-29 — End: 2017-08-03
  Administered 2017-07-29: 23:00:00 via INTRAVENOUS

## 2017-07-29 NOTE — ED Provider Notes (Signed)
Medical screening examination/treatment/procedure(s) were conducted as a shared visit with non-physician practitioner(s) and myself.  I personally evaluated the patient during the encounter.  EKG Interpretation  Date/Time:  Wednesday Jul 29 2017 18:07:15 EDT Ventricular Rate:  155 PR Interval:    QRS Duration: 96 QT Interval:  186 QTC Calculation: 299 R Axis:   45 Text Interpretation:  Ectopic atrial tachycardia, unifocal versus SVT Repolarization abnormality, prob rate related Minimal ST elevation, lateral leads Confirmed by Raeford Razor [phone]) on 07/29/2017 6:53:32 PM  62 year old male with abdominal pain.  Likely alcoholic pancreatitis.  He has a history of the same.  He is extremely hypokalemic with a potassium of 2.1.  While in the emergency room he developed tachycardia.  Likely a-flutter with 2-1 conduction versus less likely SVT.  Attempted to give the adenosine, but his IV infiltrated.  He really has no acute complaints with regards to dyspnea, chest pain or palpitations.  Will alternatively try metoprolol given shortage of IV Cardizem.  This may be precipitated by his significant electrolyte derangement.  These need repleted.  Admission for ongoing treatment.  CRITICAL CARE Performed by: Raeford Razor Total critical care time: 35 minutes Critical care time was exclusive of separately billable procedures and treating other patients. Critical care was necessary to treat or prevent imminent or life-threatening deterioration. Critical care was time spent personally by me on the following activities: development of treatment plan with patient and/or surrogate as well as nursing, discussions with consultants, evaluation of patient's response to treatment, examination of patient, obtaining history from patient or surrogate, ordering and performing treatments and interventions, ordering and review of laboratory studies, ordering and review of radiographic studies, pulse oximetry and  re-evaluation of patient's condition.    Raeford Razor, MD 07/29/17 [phone]

## 2017-07-29 NOTE — ED Notes (Signed)
Per MD Juleen China give pt 3 runs of K+ IV. Multiple doses ordered at this same time. Pt is currently on second run of K+.

## 2017-07-29 NOTE — H&P (Signed)
TRH H&P    Patient Demographics:    Brady Bell, is a 62 y.o. male  MRN: [id number]  DOB - 1955-11-29  Admit Date - 07/29/2017  Referring MD/NP/PA: Belinda Block  Outpatient Primary MD for the patient is Kari Baars, MD  Patient coming from: Home  Chief complaint-abdominal pain   HPI:    Brady Bell  is a 62 y.o. male, with history of migraine headaches, peripheral arterial disease, with recent left iliofemoral stenosis underwent endarterectomy with iliofemoral artery patch angioplasty, seizures, tobacco, alcohol abuse who was earlier admitted to the hospital with acute pancreatitis developed atrial flutter and left AMA. Today patient came back to hospital after he continued to have abdominal pain which was intermittent along with some nausea but no vomiting.  He called PCP office and was told to come to the ED for further evaluation. In the ED patient was found to be hypokalemic with potassium of 2.1, and an atrial flutter versus SVT, he received 2 doses of adenosine but IV was infiltrated and he continued to be an ectopic atrial rhythm. He denies chest pain or shortness of breath. No coughing up  ofany phlegm. Denies dysuria urgency or frequency of urination. Denies diarrhea constipation    Review of systems:      All other systems reviewed and are negative.   With Past History of the following :    Past Medical History:  Diagnosis Date  . Chronic ankle pain   . Chronic knee pain   . GERD (gastroesophageal reflux disease)   . Hepatitis    Hep A in 1980's  . Hypoglycemia   . Migraines   . Peripheral vascular disease (HCC)   . Recurrent sinus infections   . Seizures (HCC)   . Shingles       Past Surgical History:  Procedure Laterality Date  . ABDOMINAL AORTOGRAM N/A 04/23/2017   Procedure: ABDOMINAL AORTOGRAM;  Surgeon: Fransisco Hertz, MD;  Location: [facility] INVASIVE CV LAB;  Service:  Cardiovascular;  Laterality: N/A;  . ENDARTERECTOMY FEMORAL Left 04/28/2017   Procedure: ENDARTERECTOMY LEFT ILIO-FEMORAL ARTERY;  Surgeon: Fransisco Hertz, MD;  Location: [facility] OR;  Service: Vascular;  Laterality: Left;  . FASCIOTOMY  09/06/2011   Procedure: FASCIOTOMY;  Surgeon: Fransisco Hertz, MD;  Location: [facility] OR;  Service: Vascular;  Laterality: Left;  . FEMORAL-TIBIAL BYPASS GRAFT  09/06/2011   Procedure: BYPASS GRAFT FEMORAL-TIBIAL ARTERY;  Surgeon: Fransisco Hertz, MD;  Location: [facility] OR;  Service: Vascular;  Laterality: Left;  . KNEE CARTILAGE SURGERY Left   . LOWER EXTREMITY ANGIOGRAM N/A 09/05/2011   Procedure: LOWER EXTREMITY ANGIOGRAM;  Surgeon: Sherren Kerns, MD;  Location: [facility] CATH LAB;  Service: Cardiovascular;  Laterality: N/A;  . LOWER EXTREMITY ANGIOGRAM Left 04/15/2012   Procedure: LOWER EXTREMITY ANGIOGRAM;  Surgeon: Fransisco Hertz, MD;  Location: [facility] CATH LAB;  Service: Cardiovascular;  Laterality: Left;  . LOWER EXTREMITY ANGIOGRAPHY Bilateral 04/23/2017   Procedure: Lower Extremity Angiography;  Surgeon: Fransisco Hertz, MD;  Location: [facility] INVASIVE CV LAB;  Service:  Cardiovascular;  Laterality: Bilateral;  . PATCH ANGIOPLASTY Left 04/28/2017   Procedure: ILIO-FEMORAL ARTERY PATCH ANGIOPLASTY USING Livia Snellen BIOLOGIC PATCH;  Surgeon: Fransisco Hertz, MD;  Location: [facility] OR;  Service: Vascular;  Laterality: Left;  . PR VEIN BYPASS GRAFT,AORTO-FEM-POP  09/06/11   Left   . TOOTH EXTRACTION  June-July-Aug. 2015   Several       Social History:      Social History   Tobacco Use  . Smoking status: Current Every Day Smoker    Packs/day: 0.50    Years: 30.00    Pack years: 15.00    Types: Cigarettes  . Smokeless tobacco: Never Used  . Tobacco comment: pt states that he is trying his best to quit  Substance Use Topics  . Alcohol use: Yes    Alcohol/week: 0.0 oz    Comment: Few drinks per day       Family History :     Family History  Problem Relation Age of Onset  . Alcohol abuse Father    . Kidney failure Father   . Other Mother        "age"  . Diabetes Unknown       Home Medications:   Prior to Admission medications   Medication Sig Start Date End Date Taking? Authorizing Provider  aspirin EC 81 MG tablet Take 81 mg by mouth daily.   Yes [provider]  cycloSPORINE (RESTASIS) 0.05 % ophthalmic emulsion Place 1 drop into both eyes 2 (two) times daily.   Yes [provider]  divalproex (DEPAKOTE ER) 500 MG 24 hr tablet Take 1 tablet (500 mg total) at bedtime by mouth. Patient taking differently: Take 750 mg by mouth at bedtime.  02/11/17  Yes Levert Feinstein, MD  eletriptan (RELPAX) 40 MG tablet Take 40 mg by mouth every 2 (two) hours as needed for migraine or headache. May repeat in 2 hours if headache persists or recurs.    Yes [provider]  fluticasone (FLONASE) 50 MCG/ACT nasal spray Place 2 sprays into both nostrils daily.   Yes [provider]  gabapentin (NEURONTIN) 300 MG capsule Take 300 mg by mouth daily.    Yes [provider]  Multiple Vitamin (MULTIVITAMIN WITH MINERALS) TABS tablet Take 1 tablet by mouth daily.   Yes [provider]  naproxen sodium (ALEVE) 220 MG tablet Take 220 mg by mouth 2 (two) times daily as needed (takes 1 tablet every morning and again later if needed for pain). Takes 1 tablet every morning    Yes [provider]  SUMAtriptan (IMITREX STATDOSE SYSTEM) 6 MG/0.5ML SOAJ Inject 6 mg into the skin as needed. Patient taking differently: Inject 6 mg into the skin as needed.  12/08/16  Yes Levert Feinstein, MD  SUMAtriptan (IMITREX) 100 MG tablet Take 1 tablet (100 mg total) by mouth every 2 (two) hours as needed for migraine. May repeat in 2 hours if headache persists or recurs. 12/08/16  Yes Levert Feinstein, MD     Allergies:    No Known Allergies   Physical Exam:   Vitals  Blood pressure (!) 137/106, pulse (!) 154, temperature 97.9 F (36.6 C), temperature source Temporal, resp. rate  (!) 27, weight 64 kg (141 lb), SpO2 96 %.  1.  General: Appears in no acute distress  2. Psychiatric:  Intact judgement and  insight, awake alert, oriented x 3.  3. Neurologic: No focal neurological deficits, all cranial nerves intact.Strength 5/5 all 4 extremities, sensation intact  all 4 extremities, plantars down going.  4. Eyes :  anicteric sclerae, moist conjunctivae with no lid lag. PERRLA.  5. ENMT:  Oropharynx clear with moist mucous membranes and good dentition  6. Neck:  supple, no cervical lymphadenopathy appriciated, No thyromegaly  7. Respiratory : Normal respiratory effort, good air movement bilaterally,clear to  auscultation bilaterally  8. Cardiovascular : RRR, no gallops, rubs or murmurs, no leg edema  9. Gastrointestinal:  Positive bowel sounds, abdomen soft, non-tender to palpation,no hepatosplenomegaly, no rigidity or guarding       10. Skin:  No cyanosis, normal texture and turgor, no rash, lesions or ulcers  11.Musculoskeletal:  Good muscle tone,  joints appear normal , no effusions,  normal range of motion    Data Review:    CBC Recent Labs  Lab 07/23/17 0453 07/24/17 0509 07/29/17 1541  WBC 18.4* 15.9* 8.9  HGB 13.9 13.7 14.2  HCT 41.4 42.5 41.8  PLT 103* 118* 348  MCV 98.8 100.5* 96.8  MCH 33.2 32.4 32.9  MCHC 33.6 32.2 34.0  RDW 13.6 13.6 13.4  LYMPHSABS  --   --  1.4  MONOABS  --   --  1.7*  EOSABS  --   --  0.2  BASOSABS  --   --  0.1   ------------------------------------------------------------------------------------------------------------------  Chemistries  Recent Labs  Lab 07/23/17 0453 07/24/17 0509 07/29/17 1541 07/29/17 1808  NA 134* 132* 137  --   K 3.6 3.8 2.1*  --   CL 101 102 92*  --   CO2 22 19* 29  --   GLUCOSE 81 65 108*  --   BUN --   CREATININE 0.69 0.63 0.70  --   CALCIUM 7.4* 8.0* 8.9  --   MG  --   --   --  1.8  AST --   ALT 14* 13* 17  --   ALKPHOS 73 73 98  --   BILITOT  1.1 1.1 0.7  --    ------------------------------------------------------------------------------------------------------------------  ------------------------------------------------------------------------------------------------------------------ GFR: Estimated Creatinine Clearance: 90 mL/min (by C-G formula based on SCr of 0.7 mg/dL). Liver Function Tests: Recent Labs  Lab 07/23/17 0453 07/24/17 0509 07/29/17 1541  AST ALT 14* 13* 17  ALKPHOS 73 73 98  BILITOT 1.1 1.1 0.7  PROT 5.3* 5.7* 6.7  ALBUMIN 2.4* 2.5* 2.9*   Recent Labs  Lab 07/23/17 0453 07/29/17 1541  LIPASE 78* 151*    --------------------------------------------------------------------------------------------------------------- Urine analysis:    Component Value Date/Time   COLORURINE YELLOW 07/29/2017 1500   APPEARANCEUR CLEAR 07/29/2017 1500   LABSPEC 1.006 07/29/2017 1500   PHURINE 7.0 07/29/2017 1500   GLUCOSEU NEGATIVE 07/29/2017 1500   HGBUR NEGATIVE 07/29/2017 1500   BILIRUBINUR NEGATIVE 07/29/2017 1500   KETONESUR 5 (A) 07/29/2017 1500   PROTEINUR NEGATIVE 07/29/2017 1500   UROBILINOGEN 0.2 01/05/2015 1316   NITRITE NEGATIVE 07/29/2017 1500   LEUKOCYTESUR NEGATIVE 07/29/2017 1500      Imaging Results:    No results found.  My personal review of EKG: Rhythm-atrial flutter   Assessment & Plan:    Active Problems:   Atrial flutter (HCC)   1. Abdominal pain-patient's lipase is normal, he has intermittent abdominal pain, no vomiting.  We will keep him n.p.o.  Start IV normal saline at 75 hour/h.  Pain control with as needed morphine 2. Atrial flutter with RVR-we will start patient on Cardizem infusion.  I called and discussed with  cardiology fellow at [facility], patient's This patients CHA2DS2-VASc Score and unadjusted Ischemic Stroke Rate (% per year) is equal to 1.  We will start him on IV heparin along with Cardizem, if rate is not controlled with Cardizem, will   consider amiodarone infusion.  Consult cardiology in a.m. 3. Hypokalemia-patient potassium was significantly low at 2.1, already received magnesium in the ED.  Potassium is been replaced with IV KCl 10 meq x 4.  He also received p.o. potassium 60 mEq p.o. x1.  Will check BMP in a.m. 4. Peripheral vascular disease-continue aspirin 5. Accelerated hypertension-patient has persistently elevated blood pressure, it has been high over the past few days also during previous admission he had high blood pressure.  Not sure whether this is during hospitalization only or patient has developed hypotension.  Will treat with as needed hydralazine, patient is already on Cardizem infusion.  Patient will benefit from metoprolol at the time of discharge which will also help and rate control along with hypertension. 6. Tobacco abuse-counseled 7. Alcohol abuse-no signs and symptoms of alcohol withdrawal, will start CIWA protocol.    DVT Prophylaxis-   Heparin protocol  AM Labs Ordered, also please review Full Orders  Family Communication: Admission, patients condition and plan of care including tests being ordered have been discussed with the patient  who indicate understanding and agree with the plan and Code Status.  Code Status:  Full code  Admission status: Inpatient  Time spent in minutes : 60 min   Meredeth Ide M.D on 07/29/2017 at 8:16 PM  Between 7am to 7pm - Pager - [phone]. After 7pm go to [email or web address] - password [facility]  [facility] - Office  [phone]

## 2017-07-29 NOTE — ED Notes (Signed)
Report given to Fayrene Fearing, RN ICU.

## 2017-07-29 NOTE — Progress Notes (Signed)
ANTICOAGULATION CONSULT NOTE - Initial Consult  Pharmacy Consult for Heparin Indication: atrial fibrillation  No Known Allergies  Patient Measurements: Height:  (177.8 cm) Weight: 141 lb (64 kg) IBW/kg (Calculated) : 73 HEPARIN DW (KG): 64   Vital Signs: Temp: 97.9 F (36.6 C) (05/01 1458) Temp Source: Temporal (05/01 1458) BP: 137/106 (05/01 1930) Pulse Rate: 154 (05/01 1900)  Labs: Recent Labs    07/29/17 1541  HGB 14.2  HCT 41.8  PLT 348  CREATININE 0.70    Estimated Creatinine Clearance: 90 mL/min (by C-G formula based on SCr of 0.7 mg/dL).  Medical History: Past Medical History:  Diagnosis Date  . Chronic ankle pain   . Chronic knee pain   . GERD (gastroesophageal reflux disease)   . Hepatitis    Hep A in 1980's  . Hypoglycemia   . Migraines   . Peripheral vascular disease (HCC)   . Recurrent sinus infections   . Seizures (HCC)   . Shingles    Medications:   (Not in a hospital admission)  See Med Rec, reviewed no anticoagulants PTA.  Assessment: Okay for Protocol, baseline anticoag labs pending.  Goal of Therapy:  Heparin level 0.3-0.7 units/ml Monitor platelets by anticoagulation protocol: Yes   Plan:  Give 4000 units bolus x 1 Start heparin infusion at 1000 units/hr Check anti-Xa level in 6-8 hours and daily while on heparin Continue to monitor H&H and platelets  Mady Gemma 07/29/2017,8:33 PM

## 2017-07-29 NOTE — ED Provider Notes (Signed)
[facility] EMERGENCY DEPARTMENT Provider Note   CSN: [id number] Arrival date & time: 07/29/17  1454     History   Chief Complaint Chief Complaint  Patient presents with  . Abdominal Pain    pancreatitis    HPI Brady Bell is a 62 y.o. male with a history as outlined below, most significant for history of alcohol induced pancreatitis presenting with persistent abdominal pain, nausea, vomiting along with decreased oral intake since he was discharged from here last week with the same complaints and diagnosis.  He contacted his primary doctor today and was advised to come to the hospital for readmission.  He has been able to control the vomiting with home Zofran, but has pretty significant generalized abdominal pain which has worsened over the past several days.  He denies fevers or chills, denies diarrhea, constipation, dysuria.  He does endorse generalized weakness and fatigue.  He has found no alleviators for his symptoms.  His last EtOH intake was yesterday evening.  He denies any history of DTs, but also endorses drinks most days.  The history is provided by the patient.    Past Medical History:  Diagnosis Date  . Chronic ankle pain   . Chronic knee pain   . GERD (gastroesophageal reflux disease)   . Hepatitis    Hep A in 1980's  . Hypoglycemia   . Migraines   . Peripheral vascular disease (HCC)   . Recurrent sinus infections   . Seizures (HCC)   . Shingles     Patient Active Problem List   Diagnosis Date Noted  . Acute gallstone pancreatitis 07/22/2017  . Acute alcoholic pancreatitis 07/22/2017  . Hypertensive urgency 07/22/2017  . Acute pancreatitis 07/22/2017  . Accelerated hypertension   . Tobacco abuse counseling   . PAD (peripheral artery disease) (HCC) 04/28/2017  . Chronic migraine 02/11/2017  . New daily persistent headache 12/08/2016  . Peripheral vascular disease (HCC) 12/08/2016  . Pain in joint, lower leg 02/10/2014  . Numbness and tingling of left  leg 12/16/2013  . Cramps of left lower extremity-Leg 12/16/2013  . Aftercare following surgery of the circulatory system, NEC 12/16/2013  . PVD (peripheral vascular disease) with claudication (HCC) 05/07/2012  . Pain in limb 03/26/2012  . Pain, lower leg 12/26/2011  . Atherosclerosis of native arteries of extremity with intermittent claudication (HCC) 09/26/2011  . CLOSED FRACTURE OF UPPER END OF TIBIA 02/27/2010  . TEAR MEDIAL MENISCUS 02/27/2010    Past Surgical History:  Procedure Laterality Date  . ABDOMINAL AORTOGRAM N/A 04/23/2017   Procedure: ABDOMINAL AORTOGRAM;  Surgeon: Fransisco Hertz, MD;  Location: [facility] INVASIVE CV LAB;  Service: Cardiovascular;  Laterality: N/A;  . ENDARTERECTOMY FEMORAL Left 04/28/2017   Procedure: ENDARTERECTOMY LEFT ILIO-FEMORAL ARTERY;  Surgeon: Fransisco Hertz, MD;  Location: [facility] OR;  Service: Vascular;  Laterality: Left;  . FASCIOTOMY  09/06/2011   Procedure: FASCIOTOMY;  Surgeon: Fransisco Hertz, MD;  Location: [facility] OR;  Service: Vascular;  Laterality: Left;  . FEMORAL-TIBIAL BYPASS GRAFT  09/06/2011   Procedure: BYPASS GRAFT FEMORAL-TIBIAL ARTERY;  Surgeon: Fransisco Hertz, MD;  Location: [facility] OR;  Service: Vascular;  Laterality: Left;  . KNEE CARTILAGE SURGERY Left   . LOWER EXTREMITY ANGIOGRAM N/A 09/05/2011   Procedure: LOWER EXTREMITY ANGIOGRAM;  Surgeon: Sherren Kerns, MD;  Location: [facility] CATH LAB;  Service: Cardiovascular;  Laterality: N/A;  . LOWER EXTREMITY ANGIOGRAM Left 04/15/2012   Procedure: LOWER EXTREMITY ANGIOGRAM;  Surgeon: Fransisco Hertz, MD;  Location: [facility] CATH LAB;  Service: Cardiovascular;  Laterality: Left;  . LOWER EXTREMITY ANGIOGRAPHY Bilateral 04/23/2017   Procedure: Lower Extremity Angiography;  Surgeon: Fransisco Hertz, MD;  Location: [facility] INVASIVE CV LAB;  Service: Cardiovascular;  Laterality: Bilateral;  . PATCH ANGIOPLASTY Left 04/28/2017   Procedure: ILIO-FEMORAL ARTERY PATCH ANGIOPLASTY USING Livia Snellen BIOLOGIC PATCH;  Surgeon: Fransisco Hertz, MD;   Location: [facility] OR;  Service: Vascular;  Laterality: Left;  . PR VEIN BYPASS GRAFT,AORTO-FEM-POP  09/06/11   Left   . TOOTH EXTRACTION  June-July-Aug. 2015   Several         Home Medications    Prior to Admission medications   Medication Sig Start Date End Date Taking? Authorizing Provider  amoxicillin-clavulanate (AUGMENTIN) 875-125 MG tablet Take 1 tablet by mouth every 12 (twelve) hours. Patient not taking: Reported on 06/03/2017 05/20/17   Fransisco Hertz, MD  aspirin EC 81 MG tablet Take 81 mg by mouth daily.    [provider]  cycloSPORINE (RESTASIS) 0.05 % ophthalmic emulsion Place 1 drop into both eyes 2 (two) times daily.    [provider]  divalproex (DEPAKOTE ER) 500 MG 24 hr tablet Take 1 tablet (500 mg total) at bedtime by mouth. 02/11/17   Levert Feinstein, MD  eletriptan (RELPAX) 40 MG tablet Take 40 mg by mouth every 2 (two) hours as needed for migraine or headache. May repeat in 2 hours if headache persists or recurs.     [provider]  fluticasone (FLONASE) 50 MCG/ACT nasal spray Place 2 sprays into both nostrils daily.    [provider]  gabapentin (NEURONTIN) 300 MG capsule Take 300 mg by mouth daily.     [provider]  Multiple Vitamin (MULTIVITAMIN WITH MINERALS) TABS tablet Take 1 tablet by mouth daily.    [provider]  naproxen sodium (ALEVE) 220 MG tablet Take 220 mg by mouth 2 (two) times daily as needed (takes 1 tablet every morning and again later if needed for pain). Takes 1 tablet every morning     [provider]  ondansetron (ZOFRAN ODT) 4 MG disintegrating tablet Take 1 tablet (4 mg total) by mouth every 8 (eight) hours as needed. Patient taking differently: Take 4 mg by mouth every 8 (eight) hours as needed for nausea.  12/08/16   Levert Feinstein, MD  oxyCODONE (OXY IR/ROXICODONE) 5 MG immediate release tablet Take 1 tablet (5 mg total) by mouth every 4 (four) hours as needed for moderate pain. Patient not  taking: Reported on 07/22/2017 04/29/17   Lars Mage, PA-C  oxyCODONE-acetaminophen (PERCOCET/ROXICET) 5-325 MG tablet Take 1 tablet by mouth every 4 (four) hours as needed for severe pain. Patient not taking: Reported on 07/22/2017 05/20/17   Fransisco Hertz, MD  SUMAtriptan (IMITREX STATDOSE SYSTEM) 6 MG/0.5ML SOAJ Inject 6 mg into the skin as needed. Patient taking differently: Inject 6 mg into the skin as needed.  12/08/16   Levert Feinstein, MD  SUMAtriptan (IMITREX) 100 MG tablet Take 1 tablet (100 mg total) by mouth every 2 (two) hours as needed for migraine. May repeat in 2 hours if headache persists or recurs. 12/08/16   Levert Feinstein, MD    Family History Family History  Problem Relation Age of Onset  . Alcohol abuse Father   . Kidney failure Father   . Other Mother        "age"  . Diabetes Unknown     Social History Social History   Tobacco  Use  . Smoking status: Current Every Day Smoker    Packs/day: 0.50    Years: 30.00    Pack years: 15.00    Types: Cigarettes  . Smokeless tobacco: Never Used  . Tobacco comment: pt states that he is trying his best to quit  Substance Use Topics  . Alcohol use: Yes    Alcohol/week: 0.0 oz    Comment: Few drinks per day  . Drug use: Yes    Types: Marijuana    Comment: occasionally     Allergies   Patient has no known allergies.   Review of Systems Review of Systems  Constitutional: Positive for fatigue. Negative for chills and fever.  HENT: Negative.   Eyes: Negative.   Respiratory: Negative for chest tightness and shortness of breath.   Cardiovascular: Negative for chest pain.  Gastrointestinal: Positive for abdominal pain and nausea. Negative for vomiting.  Genitourinary: Negative.   Musculoskeletal: Negative for arthralgias, joint swelling and neck pain.  Skin: Negative.  Negative for rash and wound.  Neurological: Negative for dizziness, weakness, light-headedness, numbness and headaches.  Psychiatric/Behavioral: Negative.        Physical Exam Updated Vital Signs BP (!) 148/114   Pulse (!) 154   Temp 97.9 F (36.6 C) (Temporal)   Resp (!) 21   Wt 64 kg (141 lb)   SpO2 97%   BMI 20.23 kg/m   Physical Exam  Constitutional: He appears well-developed and well-nourished.  HENT:  Head: Normocephalic and atraumatic.  Eyes: Conjunctivae are normal.  Neck: Normal range of motion.  Cardiovascular: Normal rate, regular rhythm, normal heart sounds and intact distal pulses.  Pulmonary/Chest: Effort normal and breath sounds normal. He has no wheezes.  Abdominal: Soft. He exhibits no distension, no fluid wave, no ascites and no mass. Bowel sounds are increased. There is generalized tenderness. There is guarding.  Musculoskeletal: Normal range of motion.  Neurological: He is alert.  Skin: Skin is warm and dry.  Psychiatric: He has a normal mood and affect.  Nursing note and vitals reviewed.    ED Treatments / Results  Labs (all labs ordered are listed, but only abnormal results are displayed) Labs Reviewed  LIPASE, BLOOD - Abnormal; Notable for the following components:      Result Value   Lipase 151 (*)    All other components within normal limits  COMPREHENSIVE METABOLIC PANEL - Abnormal; Notable for the following components:   Potassium 2.1 (*)    Chloride 92 (*)    Glucose, Bld 108 (*)    Albumin 2.9 (*)    Anion gap 16 (*)    All other components within normal limits  URINALYSIS, ROUTINE W REFLEX MICROSCOPIC - Abnormal; Notable for the following components:   Ketones, ur 5 (*)    All other components within normal limits  CBC WITH DIFFERENTIAL/PLATELET - Abnormal; Notable for the following components:   Monocytes Absolute 1.7 (*)    All other components within normal limits  MAGNESIUM    EKG EKG Interpretation  Date/Time:  Wednesday Jul 29 2017 18:07:15 EDT Ventricular Rate:  155 PR Interval:    QRS Duration: 96 QT Interval:  186 QTC Calculation: 299 R Axis:   45 Text  Interpretation:  Atrial flutter with 2 to 1 block Repolarization abnormality, prob rate related Minimal ST elevation, lateral leads Reconfirmed by Raeford Razor [phone]) on 07/29/2017 6:39:01 PM   Radiology No results found.  Procedures Procedures (including critical care time)  Medications Ordered in ED Medications  potassium chloride 10 mEq in 100 mL IVPB (has no administration in time range)  magnesium sulfate IVPB 2 g 50 mL (has no administration in time range)  potassium chloride SA (K-DUR,KLOR-CON) CR tablet 60 mEq (has no administration in time range)  adenosine (ADENOCARD) 6 MG/2ML injection (has no administration in time range)  potassium chloride 10 mEq in 100 mL IVPB (10 mEq Intravenous New Bag/Given 07/29/17 1743)  morphine 4 MG/ML injection 4 mg (4 mg Intravenous Given 07/29/17 1738)  metoprolol tartrate (LOPRESSOR) injection 5 mg (5 mg Intravenous Given 07/29/17 1855)  metoprolol tartrate (LOPRESSOR) injection 5 mg (5 mg Intravenous Given 07/29/17 1927)     Initial Impression / Assessment and Plan / ED Course  I have reviewed the triage vital signs and the nursing notes.  Pertinent labs & imaging results that were available during my care of the patient were reviewed by me and considered in my medical decision making (see chart for details).    Patient with persistent acute on chronic pancreatitis with profound hypokalemia.  During ed stay, pt developed tachycardia, svt vs flutter.  tx with adenosine 6 mg with no response, attempted 12 mg, but IV infiltrated.  He is receiving IV potassium and magnesium as well as PO potassium ordered. Suspect arrhythmia secondary to electrolyte abnormality.  He was asymptomatic, denies cp, sob, palpitations.  Given lopressor 5 mg without rate response.  Repeated x 1.  Pt seen and treated by Dr. Juleen China during this ed visit.  Call to hospitalists for admission.  Discussed with Dr. Sharl Ma who accepts pt for admission.  Step down.  Ordered cardizem  drip.  Final Clinical Impressions(s) / ED Diagnoses   Final diagnoses:  Generalized abdominal pain  Alcohol-induced acute pancreatitis, unspecified complication status  Hypokalemia  Tachycardia    ED Discharge Orders    None       Victoriano Lain 07/29/17 2137    Raeford Razor, MD 07/30/17 2249

## 2017-07-29 NOTE — ED Notes (Signed)
AC called to bring PO potassium.

## 2017-07-29 NOTE — ED Notes (Signed)
Per pharmacy do not give lovenox as pt in currently on heparin drip.

## 2017-07-29 NOTE — ED Notes (Signed)
Per MD Sharl Ma start pt on PO Cardizem and then stop drip 10 minutes later.

## 2017-07-29 NOTE — ED Triage Notes (Signed)
Pt c/o "pancreatitis". Pt c/o all over abd and bloating. Denies v/d. Sx's x one week

## 2017-07-30 ENCOUNTER — Inpatient Hospital Stay (HOSPITAL_COMMUNITY): Payer: Medicaid Other

## 2017-07-30 LAB — HEPARIN LEVEL (UNFRACTIONATED)
HEPARIN UNFRACTIONATED: 0.2 [IU]/mL — AB (ref 0.30–0.70)
HEPARIN UNFRACTIONATED: 0.25 [IU]/mL — AB (ref 0.30–0.70)
HEPARIN UNFRACTIONATED: 0.49 [IU]/mL (ref 0.30–0.70)

## 2017-07-30 LAB — COMPREHENSIVE METABOLIC PANEL
ALBUMIN: 2.3 g/dL — AB (ref 3.5–5.0)
ALK PHOS: 78 U/L (ref 38–126)
ALT: 16 U/L — ABNORMAL LOW (ref 17–63)
ANION GAP: 10 (ref 5–15)
AST: 26 U/L (ref 15–41)
BILIRUBIN TOTAL: 0.8 mg/dL (ref 0.3–1.2)
BUN: 8 mg/dL (ref 6–20)
CO2: 26 mmol/L (ref 22–32)
Calcium: 8 mg/dL — ABNORMAL LOW (ref 8.9–10.3)
Chloride: 102 mmol/L (ref 101–111)
Creatinine, Ser: 0.61 mg/dL (ref 0.61–1.24)
GFR calc non Af Amer: >60 mL/min (ref >60)
GLUCOSE: 97 mg/dL (ref 65–99)
POTASSIUM: 3.1 mmol/L — AB (ref 3.5–5.1)
Sodium: 138 mmol/L (ref 135–145)
TOTAL PROTEIN: 5.3 g/dL — AB (ref 6.5–8.1)

## 2017-07-30 LAB — CBC
HEMATOCRIT: 35.5 % — AB (ref 39.0–52.0)
Hemoglobin: 12.2 g/dL — ABNORMAL LOW (ref 13.0–17.0)
MCH: 33.6 pg (ref 26.0–34.0)
MCHC: 34.4 g/dL (ref 30.0–36.0)
MCV: 97.8 fL (ref 78.0–100.0)
Platelets: 283 10*3/uL (ref 150–400)
RBC: 3.63 MIL/uL — ABNORMAL LOW (ref 4.22–5.81)
RDW: 13.6 % (ref 11.5–15.5)
WBC: 8.4 10*3/uL (ref 4.0–10.5)

## 2017-07-30 LAB — LIPASE, BLOOD: LIPASE: 93 U/L — AB (ref 11–51)

## 2017-07-30 MED ORDER — IOPAMIDOL (ISOVUE-300) INJECTION 61%
100.0000 mL | Freq: Once | INTRAVENOUS | Status: AC | PRN
  Administered 2017-07-30: 100 mL via INTRAVENOUS

## 2017-07-30 MED ORDER — HEPARIN BOLUS VIA INFUSION
2000.0000 [IU] | Freq: Once | INTRAVENOUS | Status: AC
  Administered 2017-07-30: 2000 [IU] via INTRAVENOUS
  Filled 2017-07-30: qty 2000

## 2017-07-30 MED ORDER — POTASSIUM CHLORIDE 10 MEQ/100ML IV SOLN
10.0000 meq | INTRAVENOUS | Status: AC
  Administered 2017-07-30 (×4): 10 meq via INTRAVENOUS
  Filled 2017-07-30 (×4): qty 100

## 2017-07-30 NOTE — Progress Notes (Signed)
ANTICOAGULATION CONSULT NOTE -  Pharmacy Consult for Heparin Indication: atrial fibrillation  No Known Allergies  Patient Measurements: Height:  (177.8 cm) Weight: 147 lb 0.8 oz (66.7 kg) IBW/kg (Calculated) : 73 HEPARIN DW (KG): 65.9   Vital Signs: Temp: 98.2 F (36.8 C) (05/02 1143) Temp Source: Oral (05/02 1143) BP: 125/85 (05/02 0900) Pulse Rate: 70 (05/02 1143)  Labs: Recent Labs    07/29/17 1541 07/29/17 2100 07/30/17 0452 07/30/17 1433  HGB 14.2  --  12.2*  --   HCT 41.8  --  35.5*  --   PLT 348  --  283  --   APTT  --  37*  --   --   LABPROT  --  13.4  --   --   INR  --  1.03  --   --   HEPARINUNFRC  --   --  0.20* 0.25*  CREATININE 0.70  --  0.61  --    Estimated Creatinine Clearance: 93.8 mL/min (by C-G formula based on SCr of 0.61 mg/dL). Medical History: Past Medical History:  Diagnosis Date  . Chronic ankle pain   . Chronic knee pain   . GERD (gastroesophageal reflux disease)   . Hepatitis    Hep A in 1980's  . Hypoglycemia   . Migraines   . Peripheral vascular disease (HCC)   . Recurrent sinus infections   . Seizures (HCC)   . Shingles    Medications:  Medications Prior to Admission  Medication Sig Dispense Refill Last Dose  . aspirin EC 81 MG tablet Take 81 mg by mouth daily.   07/29/2017 at Unknown time  . cycloSPORINE (RESTASIS) 0.05 % ophthalmic emulsion Place 1 drop into both eyes 2 (two) times daily.   07/29/2017 at Unknown time  . divalproex (DEPAKOTE ER) 500 MG 24 hr tablet Take 1 tablet (500 mg total) at bedtime by mouth. (Patient taking differently: Take 750 mg by mouth at bedtime. ) 90 tablet 4 07/27/2017 at Unknown time  . eletriptan (RELPAX) 40 MG tablet Take 40 mg by mouth every 2 (two) hours as needed for migraine or headache. May repeat in 2 hours if headache persists or recurs.    07/28/2017 at Unknown time  . fluticasone (FLONASE) 50 MCG/ACT nasal spray Place 2 sprays into both nostrils daily.   07/29/2017 at Unknown time  .  gabapentin (NEURONTIN) 300 MG capsule Take 300 mg by mouth daily.    07/29/2017 at Unknown time  . Multiple Vitamin (MULTIVITAMIN WITH MINERALS) TABS tablet Take 1 tablet by mouth daily.   07/29/2017 at Unknown time  . naproxen sodium (ALEVE) 220 MG tablet Take 220 mg by mouth 2 (two) times daily as needed (takes 1 tablet every morning and again later if needed for pain). Takes 1 tablet every morning    Past Week at Unknown time  . SUMAtriptan (IMITREX STATDOSE SYSTEM) 6 MG/0.5ML SOAJ Inject 6 mg into the skin as needed. (Patient taking differently: Inject 6 mg into the skin as needed. ) 12 Cartridge 11 unknown  . SUMAtriptan (IMITREX) 100 MG tablet Take 1 tablet (100 mg total) by mouth every 2 (two) hours as needed for migraine. May repeat in 2 hours if headache persists or recurs. 12 tablet 11 unknown    See Med Rec, reviewed no anticoagulants PTA.  Assessment: Okay for Protocol, Heparin level remains below goal despite earlier rate increase and rebolus.   Goal of Therapy:  Heparin level 0.3-0.7 units/ml Monitor platelets by anticoagulation  protocol: Yes   Plan:   Heparin 2000 units IV now x 1 rebolus  Increase Heparin infusion to 1400 units/hr  Heparin level in 6-8 hrs then daily  CBC daily while on Heparin  Valrie Hart A 07/30/2017,4:01 PM

## 2017-07-30 NOTE — Progress Notes (Signed)
Subjective: He came to the emergency department with more trouble with abdominal pain and was found to have atrial flutter.  He converted to sinus rhythm.  He was hypokalemic with a potassium of 2.1.  He received a [place] in the emergency department.  He has remained in sinus rhythm.  He says he still has some abdominal pain but he wants to try to eat.  He is getting anxious and may be starting alcohol withdrawal problems.  His lipase was 151 last night and I am going to repeat it today.  Objective: Vital signs in last 24 hours: Temp:  [97.8 F (36.6 C)-98.1 F (36.7 C)] 97.8 F (36.6 C) (05/02 0742) Pulse Rate:  [52-154] 65 (05/02 0742) Resp:  [16-35] 23 (05/02 0742) BP: (112-171)/(73-117) 138/83 (05/02 0745) SpO2:  [94 %-99 %] 96 % (05/01 2300) Weight:  [64 kg (141 lb)-66.7 kg (147 lb 0.8 oz)] 66.7 kg (147 lb 0.8 oz) (05/02 0500) Weight change:     Intake/Output from previous day: 05/01 0701 - 05/02 0700 In: 1087.3 [I.V.:737.3; IV Piggyback:350] Out: 750 [Urine:750]  PHYSICAL EXAM General appearance: alert, cooperative and Anxious Resp: rhonchi bilaterally Cardio: regular rate and rhythm, S1, S2 normal, no murmur, click, rub or gallop GI: He still has some mild tenderness diffusely and active bowel sounds Extremities: extremities normal, atraumatic, no cyanosis or edema Skin turgor fair  Lab Results:  Results for orders placed or performed during the hospital encounter of 07/29/17 (from the past 48 hour(s))  Urinalysis, Routine w reflex microscopic     Status: Abnormal   Collection Time: 07/29/17  3:00 PM  Result Value Ref Range   Color, Urine YELLOW YELLOW   APPearance CLEAR CLEAR   Specific Gravity, Urine 1.006 1.005 - 1.030   pH 7.0 5.0 - 8.0   Glucose, UA NEGATIVE NEGATIVE mg/dL   Hgb urine dipstick NEGATIVE NEGATIVE   Bilirubin Urine NEGATIVE NEGATIVE   Ketones, ur 5 (A) NEGATIVE mg/dL   Protein, ur NEGATIVE NEGATIVE mg/dL   Nitrite NEGATIVE NEGATIVE   Leukocytes, UA NEGATIVE NEGATIVE    Comment: Performed at [facility], [address], [place], [place] [address]  Lipase, blood     Status: Abnormal   Collection Time: 07/29/17  3:41 PM  Result Value Ref Range   Lipase 151 (H) 11 - 51 U/L    Comment: Performed at [facility], [address], [place], [place] [address]  Comprehensive metabolic panel     Status: Abnormal   Collection Time: 07/29/17  3:41 PM  Result Value Ref Range   Sodium 137 135 - 145 mmol/L   Potassium 2.1 (LL) 3.5 - 5.1 mmol/L    Comment: CRITICAL RESULT CALLED TO, READ BACK BY AND VERIFIED WITH: TRIPLETT,T ON 07/29/17 AT 1650 BY LOY,C    Chloride 92 (L) 101 - 111 mmol/L   CO2 29 22 - 32 mmol/L   Glucose, Bld 108 (H) 65 - 99 mg/dL   BUN 8 6 - 20 mg/dL   Creatinine, Ser 0.70 0.61 - 1.24 mg/dL   Calcium 8.9 8.9 - 10.3 mg/dL   Total Protein 6.7 6.5 - 8.1 g/dL   Albumin 2.9 (L) 3.5 - 5.0 g/dL   AST 30 15 - 41 U/L   ALT 17 17 - 63 U/L   Alkaline Phosphatase 98 38 - 126 U/L   Total Bilirubin 0.7 0.3 - 1.2 mg/dL   GFR calc non Af Amer >60 >60 mL/min   GFR calc Af Amer >60 >60 mL/min  Comment: (NOTE) The eGFR has been calculated using the CKD EPI equation. This calculation has not been validated in all clinical situations. eGFR's persistently <60 mL/min signify possible Chronic Kidney Disease.    Anion gap 16 (H) 5 - 15    Comment: Performed at [facility], [address] [place], [place] [address]  CBC with Differential     Status: Abnormal   Collection Time: 07/29/17  3:41 PM  Result Value Ref Range   WBC 8.9 4.0 - 10.5 K/uL   RBC 4.32 4.22 - 5.81 MIL/uL   Hemoglobin 14.2 13.0 - 17.0 g/dL   HCT 41.8 39.0 - 52.0 %   MCV 96.8 78.0 - 100.0 fL   MCH 32.9 26.0 - 34.0 pg   MCHC 34.0 30.0 - 36.0 g/dL   RDW 13.4 11.5 - 15.5 %   Platelets 348 150 - 400 K/uL   Neutrophils Relative % 63 %   Neutro Abs 5.6 1.7 - 7.7 K/uL   Lymphocytes Relative 15 %   Lymphs Abs 1.4 0.7 - 4.0 K/uL   Monocytes Relative 19 %    Monocytes Absolute 1.7 (H) 0.1 - 1.0 K/uL   Eosinophils Relative 2 %   Eosinophils Absolute 0.2 0.0 - 0.7 K/uL   Basophils Relative 1 %   Basophils Absolute 0.1 0.0 - 0.1 K/uL    Comment: Performed at [facility], [address] [place], [place] [address]  Magnesium     Status: None   Collection Time: 07/29/17  6:08 PM  Result Value Ref Range   Magnesium 1.8 1.7 - 2.4 mg/dL    Comment: Performed at [facility], [address], [place], [place] [address]  Protime-INR     Status: None   Collection Time: 07/29/17  9:00 PM  Result Value Ref Range   Prothrombin Time 13.4 11.4 - 15.2 seconds   INR 1.03     Comment: Performed at [facility], [address], [place], [place] [address]  APTT     Status: Abnormal   Collection Time: 07/29/17  9:00 PM  Result Value Ref Range   aPTT 37 (H) 24 - 36 seconds    Comment:        IF BASELINE aPTT IS ELEVATED, SUGGEST PATIENT RISK ASSESSMENT BE USED TO DETERMINE APPROPRIATE ANTICOAGULANT THERAPY. Performed at [facility], [address], [place], [place] [address]   Heparin level (unfractionated)     Status: Abnormal   Collection Time: 07/30/17  4:52 AM  Result Value Ref Range   Heparin Unfractionated 0.20 (L) 0.30 - 0.70 IU/mL    Comment:        IF HEPARIN RESULTS ARE BELOW EXPECTED VALUES, AND PATIENT DOSAGE HAS BEEN CONFIRMED, SUGGEST FOLLOW UP TESTING OF ANTITHROMBIN III LEVELS. Performed at [facility] [address], [place], [place] [address]   CBC     Status: Abnormal   Collection Time: 07/30/17  4:52 AM  Result Value Ref Range   WBC 8.4 4.0 - 10.5 K/uL   RBC 3.63 (L) 4.22 - 5.81 MIL/uL   Hemoglobin 12.2 (L) 13.0 - 17.0 g/dL   HCT 35.5 (L) 39.0 - 52.0 %   MCV 97.8 78.0 - 100.0 fL   MCH 33.6 26.0 - 34.0 pg   MCHC 34.4 30.0 - 36.0 g/dL   RDW 13.6 11.5 - 15.5 %   Platelets 283 150 - 400 K/uL    Comment: Performed at [facility], [address], [place], [place] [address]  Comprehensive metabolic panel  Status: Abnormal    Collection Time: 07/30/17  4:52 AM  Result Value Ref Range   Sodium 138 135 - 145 mmol/L   Potassium 3.1 (L) 3.5 - 5.1 mmol/L    Comment: DELTA CHECK NOTED   Chloride 102 101 - 111 mmol/L   CO2 26 22 - 32 mmol/L   Glucose, Bld 97 65 - 99 mg/dL   BUN 8 6 - 20 mg/dL   Creatinine, Ser 0.61 0.61 - 1.24 mg/dL   Calcium 8.0 (L) 8.9 - 10.3 mg/dL   Total Protein 5.3 (L) 6.5 - 8.1 g/dL   Albumin 2.3 (L) 3.5 - 5.0 g/dL   AST 26 15 - 41 U/L   ALT 16 (L) 17 - 63 U/L   Alkaline Phosphatase 78 38 - 126 U/L   Total Bilirubin 0.8 0.3 - 1.2 mg/dL   GFR calc non Af Amer >60 >60 mL/min   GFR calc Af Amer >60 >60 mL/min    Comment: (NOTE) The eGFR has been calculated using the CKD EPI equation. This calculation has not been validated in all clinical situations. eGFR's persistently <60 mL/min signify possible Chronic Kidney Disease.    Anion gap 10 5 - 15    Comment: Performed at [facility], [address] [place], [place] [address]    ABGS No results for input(s): PHART, PO2ART, TCO2, HCO3 in the last 72 hours.  Invalid input(s): PCO2 CULTURES Recent Results (from the past 240 hour(s))  Urine culture     Status: None   Collection Time: 07/22/17  4:37 AM  Result Value Ref Range Status   Specimen Description   Final    URINE, CLEAN CATCH Performed at [facility], [address], [place], [place] [address]    Special Requests   Final    Normal Performed at [facility], [address] [place], [place] [address]    Culture   Final    NO GROWTH Performed at [facility], [place] [address], [place], [place] [address]    Report Status 07/24/2017 FINAL  Final  MRSA PCR Screening     Status: None   Collection Time: 07/24/17 10:20 PM  Result Value Ref Range Status   MRSA by PCR NEGATIVE NEGATIVE Final    Comment:        The GeneXpert MRSA Assay (FDA approved for NASAL specimens only), is one component of a comprehensive MRSA colonization surveillance program. It is not intended to  diagnose MRSA infection nor to guide or monitor treatment for MRSA infections. Performed at [facility], [address], [place],  [address]    Studies/Results: No results found.  Medications:  Prior to Admission:  Medications Prior to Admission  Medication Sig Dispense Refill Last Dose  . aspirin EC 81 MG tablet Take 81 mg by mouth daily.   07/29/2017 at Unknown time  . cycloSPORINE (RESTASIS) 0.05 % ophthalmic emulsion Place 1 drop into both eyes 2 (two) times daily.   07/29/2017 at Unknown time  . divalproex (DEPAKOTE ER) 500 MG 24 hr tablet Take 1 tablet (500 mg total) at bedtime by mouth. (Patient taking differently: Take 750 mg by mouth at bedtime. ) 90 tablet 4 07/27/2017 at Unknown time  . eletriptan (RELPAX) 40 MG tablet Take 40 mg by mouth every 2 (two) hours as needed for migraine or headache. May repeat in 2 hours if headache persists or recurs.    07/28/2017 at Unknown time  . fluticasone (FLONASE) 50 MCG/ACT nasal spray Place  2 sprays into both nostrils daily.   07/29/2017 at Unknown time  . gabapentin (NEURONTIN) 300 MG capsule Take 300 mg by mouth daily.    07/29/2017 at Unknown time  . Multiple Vitamin (MULTIVITAMIN WITH MINERALS) TABS tablet Take 1 tablet by mouth daily.   07/29/2017 at Unknown time  . naproxen sodium (ALEVE) 220 MG tablet Take 220 mg by mouth 2 (two) times daily as needed (takes 1 tablet every morning and again later if needed for pain). Takes 1 tablet every morning    Past Week at Unknown time  . SUMAtriptan (IMITREX STATDOSE SYSTEM) 6 MG/0.5ML SOAJ Inject 6 mg into the skin as needed. (Patient taking differently: Inject 6 mg into the skin as needed. ) 12 Cartridge 11 unknown  . SUMAtriptan (IMITREX) 100 MG tablet Take 1 tablet (100 mg total) by mouth every 2 (two) hours as needed for migraine. May repeat in 2 hours if headache persists or recurs. 12 tablet 11 unknown   Scheduled: . aspirin EC  81 mg Oral Daily  . cycloSPORINE  1 drop Both Eyes BID  .  diltiazem  30 mg Oral Q6H  . divalproex  750 mg Oral QHS  . fluticasone  2 spray Each Nare Daily  . folic acid  1 mg Oral Daily  . gabapentin  300 mg Oral Daily  . multivitamin with minerals  1 tablet Oral Daily  . nicotine  21 mg Transdermal Once  . thiamine  100 mg Oral Daily   Or  . thiamine  100 mg Intravenous Daily   Continuous: . sodium chloride 10 mL/hr at 07/30/17 0600  . sodium chloride 75 mL/hr at 07/30/17 0600  . heparin 1,200 Units/hr (07/30/17 0759)  . potassium chloride     KZG:[id number] **OR** acetaminophen, hydrALAZINE, LORazepam **OR** LORazepam, morphine injection, ondansetron **OR** ondansetron (ZOFRAN) IV  Assesment: He was admitted with atrial flutter and this is his second episode.  Last time he checked out AMA.  He has abdominal pain and his lipase was elevated.  We will repeat lipase and repeat CT.  He has alcoholism and seems to be starting to have some withdrawal he is on protocol  He has peripheral arterial disease which is stable  He has nicotine addiction and he is on a nicotine patch  He has seizure disorder but no seizures recently Active Problems:   Atrial flutter ([place])    Plan: Continue treatments.    LOS: 1 day   Kellyann Ordway L 07/30/2017, 8:10 AM

## 2017-07-31 ENCOUNTER — Inpatient Hospital Stay (HOSPITAL_COMMUNITY): Payer: Medicaid Other

## 2017-07-31 DIAGNOSIS — I4892 Unspecified atrial flutter: Secondary | ICD-10-CM

## 2017-07-31 DIAGNOSIS — Z720 Tobacco use: Secondary | ICD-10-CM

## 2017-07-31 DIAGNOSIS — K8010 Calculus of gallbladder with chronic cholecystitis without obstruction: Secondary | ICD-10-CM

## 2017-07-31 DIAGNOSIS — F1010 Alcohol abuse, uncomplicated: Secondary | ICD-10-CM

## 2017-07-31 DIAGNOSIS — K852 Alcohol induced acute pancreatitis without necrosis or infection: Secondary | ICD-10-CM

## 2017-07-31 DIAGNOSIS — I739 Peripheral vascular disease, unspecified: Secondary | ICD-10-CM

## 2017-07-31 DIAGNOSIS — K810 Acute cholecystitis: Secondary | ICD-10-CM

## 2017-07-31 DIAGNOSIS — E876 Hypokalemia: Secondary | ICD-10-CM

## 2017-07-31 LAB — BASIC METABOLIC PANEL
Anion gap: 9 (ref 5–15)
BUN: <5 mg/dL — ABNORMAL LOW (ref 6–20)
CALCIUM: 8 mg/dL — AB (ref 8.9–10.3)
CO2: 24 mmol/L (ref 22–32)
CREATININE: 0.53 mg/dL — AB (ref 0.61–1.24)
Chloride: 104 mmol/L (ref 101–111)
Glucose, Bld: 100 mg/dL — ABNORMAL HIGH (ref 65–99)
Potassium: 3.3 mmol/L — ABNORMAL LOW (ref 3.5–5.1)
SODIUM: 137 mmol/L (ref 135–145)

## 2017-07-31 LAB — CBC
HEMATOCRIT: 36.3 % — AB (ref 39.0–52.0)
HEMOGLOBIN: 12.5 g/dL — AB (ref 13.0–17.0)
MCH: 33.5 pg (ref 26.0–34.0)
MCHC: 34.4 g/dL (ref 30.0–36.0)
MCV: 97.3 fL (ref 78.0–100.0)
Platelets: 285 10*3/uL (ref 150–400)
RBC: 3.73 MIL/uL — AB (ref 4.22–5.81)
RDW: 13.5 % (ref 11.5–15.5)
WBC: 8 10*3/uL (ref 4.0–10.5)

## 2017-07-31 LAB — LIPASE, BLOOD: LIPASE: 62 U/L — AB (ref 11–51)

## 2017-07-31 LAB — ECHOCARDIOGRAM COMPLETE
Height: 70 in
WEIGHTICAEL: 2388.02 [oz_av]

## 2017-07-31 LAB — HEPARIN LEVEL (UNFRACTIONATED): HEPARIN UNFRACTIONATED: 0.45 [IU]/mL (ref 0.30–0.70)

## 2017-07-31 MED ORDER — NICOTINE 21 MG/24HR TD PT24
21.0000 mg | MEDICATED_PATCH | Freq: Every day | TRANSDERMAL | Status: DC
  Administered 2017-07-31 – 2017-08-03 (×4): 21 mg via TRANSDERMAL
  Filled 2017-07-31 (×4): qty 1

## 2017-07-31 MED ORDER — DILTIAZEM HCL ER COATED BEADS 120 MG PO CP24
120.0000 mg | ORAL_CAPSULE | Freq: Every day | ORAL | Status: DC
  Administered 2017-07-31 – 2017-08-03 (×4): 120 mg via ORAL
  Filled 2017-07-31 (×4): qty 1

## 2017-07-31 MED ORDER — POTASSIUM CHLORIDE 10 MEQ/100ML IV SOLN
10.0000 meq | INTRAVENOUS | Status: AC
  Administered 2017-07-31 (×6): 10 meq via INTRAVENOUS
  Filled 2017-07-31 (×6): qty 100

## 2017-07-31 MED ORDER — PIPERACILLIN-TAZOBACTAM 3.375 G IVPB
3.3750 g | Freq: Three times a day (TID) | INTRAVENOUS | Status: DC
  Administered 2017-07-31 – 2017-08-03 (×9): 3.375 g via INTRAVENOUS
  Filled 2017-07-31 (×9): qty 50

## 2017-07-31 NOTE — Consult Note (Signed)
Referring Provider: Oneal Deputy. Brady Gosling, MD Primary Care Physician:  Kari Baars, MD Primary Gastroenterologist:  Dr. Karilyn Cota  Reason for Consultation:    Acute pancreatitis.  HPI:   Patient is a 62 year old Caucasian male who has several year history of drinking vodka with increased intake over the last 6 months who was hospitalized on 07/22/2017 for acute pancreatitis and was seen in consultation by me.  On work-up he was also found to have cholelithiasis.  His transaminases were normal.  Bilirubin and alkaline phosphatase were borderline.  I felt his pancreatitis was secondary to alcohol and not biliary in nature.  It was recommended he should have his gallbladder removed once he is recovered from pancreatitis.  Patient started to feel better and was begun on clear liquids.  His hospitalization was pertinent for SVT for which she was transferred to ICU for management.  Patient decided to leave AMA on 07/24/2017. He says he decided to go back to drinking again.  He began to have more upper abdominal pain.  He was not able to eat.  He called Dr. Juanetta Bell and patient was advised to go to emergency room.  He came to emergency room on the evening of 08/06/2017.  Serum lipase was 151.  It was 846 on his last presentation and by the time he left it was down to 78. Patient has had some diaphoresis but he denies fever or chills.  He is been having nonbloody diarrhea.  He has not experienced nausea or vomiting.  He has noted some difficulty breathing even at rest.  He has continued to smoke cigarettes.  On presentation he was noted to be back in atrial flutter with rapid ventricular rate and admitted to ICU so that he could receive Cardizem infusion.  He is back into sinus rhythm.   Past Medical History:  Diagnosis Date  . Chronic ankle pain   . Chronic knee pain   . GERD (gastroesophageal reflux disease)   . Hepatitis    Hep A in 1980's  .    Marland Kitchen Migraines   . Peripheral vascular disease (HCC)   .  Recurrent sinus infections   . Seizures (HCC)   . Shingles     Past Surgical History:  Procedure Laterality Date  . ABDOMINAL AORTOGRAM N/A 04/23/2017   Procedure: ABDOMINAL AORTOGRAM;  Surgeon: Fransisco Hertz, MD;  Location: [facility] INVASIVE CV LAB;  Service: Cardiovascular;  Laterality: N/A;  . ENDARTERECTOMY FEMORAL Left 04/28/2017   Procedure: ENDARTERECTOMY LEFT ILIO-FEMORAL ARTERY;  Surgeon: Fransisco Hertz, MD;  Location: [facility] OR;  Service: Vascular;  Laterality: Left;  . FASCIOTOMY  09/06/2011   Procedure: FASCIOTOMY;  Surgeon: Fransisco Hertz, MD;  Location[facility] OR;  Service: Vascular;  Laterality: Left;  . FEMORAL-TIBIAL BYPASS GRAFT  09/06/2011   Procedure: BYPASS GRAFT FEMORAL-TIBIAL ARTERY;  Surgeon: Fransisco Hertz, MD;  Location: [facility] OR;  Service: Vascular;  Laterality: Left;  . KNEE CARTILAGE SURGERY Left   . LOWER EXTREMITY ANGIOGRAM N/A 09/05/2011   Procedure: LOWER EXTREMITY ANGIOGRAM;  Surgeon: Sherren Kerns, MD;  Location: [facility] CATH LAB;  Service: Cardiovascular;  Laterality: N/A;  . LOWER EXTREMITY ANGIOGRAM Left 04/15/2012   Procedure: LOWER EXTREMITY ANGIOGRAM;  Surgeon: Fransisco Hertz, MD;  Location: [facility] CATH LAB;  Service: Cardiovascular;  Laterality: Left;  . LOWER EXTREMITY ANGIOGRAPHY Bilateral 04/23/2017   Procedure: Lower Extremity Angiography;  Surgeon: Fransisco Hertz, MD;  Location[facility] INVASIVE CV LAB;  Service: Cardiovascular;  Laterality: Bilateral;  . PATCH  ANGIOPLASTY Left 04/28/2017   Procedure: ILIO-FEMORAL ARTERY PATCH ANGIOPLASTY USING Livia Snellen BIOLOGIC PATCH;  Surgeon: Fransisco Hertz, MD;  Location: [facility] OR;  Service: Vascular;  Laterality: Left;  . PR VEIN BYPASS GRAFT,AORTO-FEM-POP  09/06/11   Left   . TOOTH EXTRACTION  June-July-Aug. 2015   Several     Prior to Admission medications   Medication Sig Start Date End Date Taking? Authorizing Provider  aspirin EC 81 MG tablet Take 81 mg by mouth daily.   Yes [provider]  cycloSPORINE (RESTASIS) 0.05 % ophthalmic  emulsion Place 1 drop into both eyes 2 (two) times daily.   Yes [provider]  divalproex (DEPAKOTE ER) 500 MG 24 hr tablet Take 1 tablet (500 mg total) at bedtime by mouth. Patient taking differently: Take 750 mg by mouth at bedtime.  02/11/17  Yes Levert Feinstein, MD  eletriptan (RELPAX) 40 MG tablet Take 40 mg by mouth every 2 (two) hours as needed for migraine or headache. May repeat in 2 hours if headache persists or recurs.    Yes [provider]  fluticasone (FLONASE) 50 MCG/ACT nasal spray Place 2 sprays into both nostrils daily.   Yes [provider]  gabapentin (NEURONTIN) 300 MG capsule Take 300 mg by mouth daily.    Yes [provider]  Multiple Vitamin (MULTIVITAMIN WITH MINERALS) TABS tablet Take 1 tablet by mouth daily.   Yes [provider]  naproxen sodium (ALEVE) 220 MG tablet Take 220 mg by mouth 2 (two) times daily as needed (takes 1 tablet every morning and again later if needed for pain). Takes 1 tablet every morning    Yes [provider]  SUMAtriptan (IMITREX STATDOSE SYSTEM) 6 MG/0.5ML SOAJ Inject 6 mg into the skin as needed. Patient taking differently: Inject 6 mg into the skin as needed.  12/08/16  Yes Levert Feinstein, MD  SUMAtriptan (IMITREX) 100 MG tablet Take 1 tablet (100 mg total) by mouth every 2 (two) hours as needed for migraine. May repeat in 2 hours if headache persists or recurs. 12/08/16  Yes Levert Feinstein, MD    Current Facility-Administered Medications  Medication Dose Route Frequency Provider Last Rate Last Dose  . 0.9 %  sodium chloride infusion   Intravenous Continuous Meredeth Ide, MD 10 mL/hr at 07/31/17 0000    . 0.9 %  sodium chloride infusion   Intravenous Continuous Meredeth Ide, MD 75 mL/hr at 07/31/17 0000    . acetaminophen (TYLENOL) tablet 650 mg  650 mg Oral Q6H PRN Meredeth Ide, MD       Or  . acetaminophen (TYLENOL) suppository 650 mg  650 mg Rectal Q6H PRN Meredeth Ide, MD      . aspirin EC  tablet 81 mg  81 mg Oral Daily Meredeth Ide, MD   81 mg at 07/31/17 0946  . cycloSPORINE (RESTASIS) 0.05 % ophthalmic emulsion 1 drop  1 drop Both Eyes BID Meredeth Ide, MD   1 drop at 07/31/17 1051  . diltiazem (CARDIZEM CD) 24 hr capsule 120 mg  120 mg Oral Daily Randall An M, PA-C   120 mg at 07/31/17 1050  . divalproex (DEPAKOTE ER) 24 hr tablet 750 mg  750 mg Oral QHS Meredeth Ide, MD   750 mg at 07/31/17 0020  . fluticasone (FLONASE) 50 MCG/ACT nasal spray 2 spray  2 spray Each Nare Daily Meredeth Ide, MD   2 spray at 07/31/17 0946  . folic  acid (FOLVITE) tablet 1 mg  1 mg Oral Daily Meredeth Ide, MD   1 mg at 07/31/17 0946  . gabapentin (NEURONTIN) capsule 300 mg  300 mg Oral Daily Meredeth Ide, MD   300 mg at 07/31/17 0946  . hydrALAZINE (APRESOLINE) tablet 25 mg  25 mg Oral Q6H PRN Meredeth Ide, MD      . LORazepam (ATIVAN) tablet 1 mg  1 mg Oral Q6H PRN Meredeth Ide, MD   1 mg at 07/31/17 1317   Or  . LORazepam (ATIVAN) injection 1 mg  1 mg Intravenous Q6H PRN Meredeth Ide, MD   1 mg at 07/30/17 2117  . morphine 2 MG/ML injection 1 mg  1 mg Intravenous Q4H PRN Meredeth Ide, MD   1 mg at 07/31/17 1044  . multivitamin with minerals tablet 1 tablet  1 tablet Oral Daily Meredeth Ide, MD   1 tablet at 07/31/17 0946  . nicotine (NICODERM CQ - dosed in mg/24 hours) patch 21 mg  21 mg Transdermal Daily Kari Baars, MD   21 mg at 07/31/17 1322  . ondansetron (ZOFRAN) tablet 4 mg  4 mg Oral Q6H PRN Meredeth Ide, MD       Or  . ondansetron (ZOFRAN) injection 4 mg  4 mg Intravenous Q6H PRN Meredeth Ide, MD      . piperacillin-tazobactam (ZOSYN) IVPB 3.375 g  3.375 g Intravenous Louis Matte, MD   Stopped at 07/31/17 1317  . thiamine (VITAMIN B-1) tablet 100 mg  100 mg Oral Daily Meredeth Ide, MD   100 mg at 07/31/17 0946   Or  . thiamine (B-1) injection 100 mg  100 mg Intravenous Daily Meredeth Ide, MD        Allergies as of 07/29/2017  . (No Known Allergies)     Family History  Problem Relation Age of Onset  . Alcohol abuse Father   . Kidney failure Father   . Other Mother        "age"  . Diabetes Unknown     Social History   Socioeconomic History  . Marital status: Single    Spouse name: Not on file  . Number of children: 0  . Years of education: some college  . Highest education level: Not on file  Occupational History  . Occupation: Disabled  Social Needs  . Financial resource strain: Not on file  . Food insecurity:    Worry: Not on file    Inability: Not on file  . Transportation needs:    Medical: Not on file    Non-medical: Not on file  Tobacco Use  . Smoking status: Current Every Day Smoker    Packs/day: 0.50    Years: 30.00    Pack years: 15.00    Types: Cigarettes  . Smokeless tobacco: Never Used  . Tobacco comment: pt states that he is trying his best to quit  Substance and Sexual Activity  . Alcohol use: Yes    Alcohol/week: 0.0 oz    Comment: Few drinks per day  . Drug use: Yes    Types: Marijuana    Comment: occasionally  . Sexual activity: Yes  Lifestyle  . Physical activity:    Days per week: Not on file    Minutes per session: Not on file  . Stress: Not on file  Relationships  . Social connections:    Talks on phone: Not on file  Gets together: Not on file    Attends religious service: Not on file    Active member of club or organization: Not on file    Attends meetings of clubs or organizations: Not on file    Relationship status: Not on file  . Intimate partner violence:    Fear of current or ex partner: Not on file    Emotionally abused: Not on file    Physically abused: Not on file    Forced sexual activity: Not on file  Other Topics Concern  . Not on file  Social History Narrative   Lives alone.   Left-handed.   2-3 cups caffeine per day.    Review of Systems: See HPI, otherwise normal ROS  Physical Exam: Temp:  [97.6 F (36.4 C)-99 F (37.2 C)] 99 F (37.2 C) (05/03  1600) Pulse Rate:  [37-77] 72 (05/03 1500) Resp:  [15-28] 22 (05/03 1500) BP: (107-155)/(72-98) 155/92 (05/03 1500) SpO2:  [95 %] 95 % (05/03 0800) Weight:  [149 lb 4 oz (67.7 kg)] 149 lb 4 oz (67.7 kg) (05/03 0500) Last BM Date: 07/30/17  Well-developed thin Caucasian male in NAD. Patient is alert and in no acute distress. He is not tremulous. Conjunctiva is pink.  Sclerae nonicteric. Oropharyngeal mucosa is normal. He has few teeth missing in upper and lower jaw. Neck masses or thyromegaly noted. Cardiac exam with regular rhythm normal S1 and S2.  No murmur or gallop noted. Auscultation of lungs reveal vesicular breath sounds bilaterally.  He has few basilar rhonchi. Abdomen is full.  Ecchymosis noted in right lower quadrant from  Lovenox injections.  Bowel sounds are normal.  On palpation abdomen is soft.  He has mild generalized tenderness but in mid epigastric region he had moderate tenderness.  No organomegaly or masses. Ecchymoses noted in left groin where he had a vascular surgery few weeks ago.  There is induration in this area.   Lab Results: Recent Labs    07/29/17 1541 07/30/17 0452 07/31/17 0422  WBC 8.9 8.4 8.0  HGB 14.2 12.2* 12.5*  HCT 41.8 35.5* 36.3*  PLT 348 283 285   BMET Recent Labs    07/29/17 1541 07/30/17 0452 07/31/17 0422  NA 137 138 137  K 2.1* 3.1* 3.3*  CL 92* 102 104  CO2 29 26 24   GLUCOSE 108* 97 100*  BUN 8 8 <5*  CREATININE 0.70 0.61 0.53*  CALCIUM 8.9 8.0* 8.0*   LFT Recent Labs    07/30/17 0452  PROT 5.3*  ALBUMIN 2.3*  AST 26  ALT 16*  ALKPHOS 78  BILITOT 0.8   PT/INR Recent Labs    07/29/17 2100  LABPROT 13.4  INR 1.03   Hepatitis Panel No results for input(s): HEPBSAG, HCVAB, HEPAIGM, HEPBIGM in the last 72 hours.  Studies/Results: Ct Abdomen Pelvis W Wo Contrast  Result Date: 07/30/2017 CLINICAL DATA:  62 year old male with history of upper abdominal pain and nausea for several days. Elevated lipase. EXAM: CT  ABDOMEN AND PELVIS WITHOUT AND WITH CONTRAST TECHNIQUE: Multidetector CT imaging of the abdomen and pelvis was performed following the standard protocol before and following the bolus administration of intravenous contrast. CONTRAST:  ISOVUE-300 IOPAMIDOL (ISOVUE-300) INJECTION 61% COMPARISON:  CT the abdomen and pelvis 07/22/2017. FINDINGS: Lower chest: Small to moderate bilateral pleural effusions lying dependently with some associated passive atelectasis in the lower lobes of the lungs bilaterally. Emphysematous changes throughout the visualized lung bases. Atherosclerotic calcifications in the left anterior descending coronary arteries.  Hepatobiliary: Focal fatty infiltration in segment 4A and 4B of the liver adjacent to the falciform ligament. No other suspicious cystic or solid hepatic lesions. No intra or extrahepatic biliary ductal dilatation. Gallbladder is under distended, but the gallbladder wall appears thickened measuring up to 9 mm, and demonstrates slight haziness in the adjacent fat, which could indicate inflammation. No definite calcified gallstones are identified. Pancreas: No definite pancreatic mass. No pancreatic ductal dilatation. Large amount of peripancreatic fluid and inflammatory changes surrounding the body and tail of the pancreas, indicative of an acute pancreatitis, significantly increased compared to the prior study. Pancreatic parenchyma appears to enhance normally. Spleen: Numerous calcified granulomas scattered throughout the spleen. Adrenals/Urinary Tract: Bilateral kidneys and bilateral adrenal glands are normal in appearance. No hydroureteronephrosis. Urinary bladder is normal in appearance. Bilateral adrenal glands are normal in appearance. Stomach/Bowel: Normal appearance of the stomach. No pathologic dilatation of small bowel or colon. The appendix is not confidently identified and may be surgically absent. Regardless, there are no inflammatory changes noted adjacent to  the cecum to suggest the presence of an acute appendicitis at this time. Vascular/Lymphatic: Aortic atherosclerosis, without evidence of aneurysm or dissection in the abdominal or pelvic vasculature. Bypass graft extending from the left superficial femoral artery (left femoral-tibial bypass graft) in the upper left thigh incompletely imaged. There continues to be some circumferential low attenuation with surrounding rim enhancement and adjacent haziness in the perivascular fat associated with this area, which was a site of left iliofemoral endarterectomy with bovine patch angioplasty on 04/28/2017. 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 Circumaortic left renal vein (normal anatomical variant) incidentally noted. No lymphadenopathy noted in the abdomen or pelvis. Reproductive: Prostate gland and seminal vesicles are unremarkable in appearance. Other: No significant volume of ascites.  No pneumoperitoneum. Musculoskeletal: No aggressive appearing lytic or blastic lesions are noted in the visualized portions of the skeleton. IMPRESSION: 1. Today's study demonstrates worsening inflammatory changes and increasing peripancreatic fluid adjacent to the body and tail of the pancreas, indicative of persistent acute pancreatitis. These fluid collections appear to be partially organizing, likely developing in to pancreatic pseudocysts, although no well defined completely organized collection is identified at this time. 2. Gallbladder is partially contracted but demonstrates some mural thickening and slight haziness in the pericholecystic fat. No calcified gallstones are noted. Findings are concerning for acute acalculous cholecystitis, as suggested on prior ultrasound examination. 3. Postoperative changes related to prior left iliofemoral endarterectomy and bovine patch angioplasty again noted. This may simply reflect postoperative healing, however, given  the subtle inflammatory changes in the fat adjacent to the surgical site, clinical correlation to exclude infection is recommended. 4. Small to moderate bilateral pleural effusions lying dependently, new compared to the prior study. This is associated with areas of passive atelectasis in the visualize lung bases. 5. Aortic atherosclerosis. 6. Additional incidental findings, as above. Aortic Atherosclerosis (ICD10-I70.0). Electronically Signed   By: Trudie Reed M.D.   On: 07/30/2017 10:42   I have reviewed current CT and compared it from the one taken on 07/22/2017.  Worsening inflammatory changes noted in peripancreatic region along with fluid collection around body and tail without discrete wall formation.  Contracted gallbladder with some wall thickening and slight haziness of pericholecystic fluid.  No gallstones identified on the study.  Assessment;  Patient is a 62 year old Caucasian male who was recently hospitalized for acute pancreatitis felt to be secondary to alcohol abuse and he was also noted  to have cholelithiasis.  Patient signed AMA on 07/28/2017 and decided to go back to drinking vodka again.  Now he presents with worsening pain and CT shows evidence of progressive pancreatitis.  He does not have dilated bile duct and his transaminases are normal.  Therefore his pancreatitis is not secondary to gallstones.  Now has developed bilateral pleural effusions and has developed small peripancreatic fluid collections which are not fully organized.  He is at risk for developing pseudocyst.  He would however need cholecystectomy when he is recovered from acute pancreatitis.  He has been evaluated by Dr. Lovell Sheehan of surgical service. Will need to monitor him closely for signs and symptoms of worsening pancreatitis. Patient is presently on Zosyn.  His admission is also pertinent for hypokalemia for which he has received IV KCl, paroxysmal atrial flutter.  He has been evaluated by Dr. Purvis Sheffield of  cardiology service.  Recommendations;  Continue clear liquids for now. Will need to monitor fluid and electrolyte balance closely. Need and timing for repeat CT to be determined by his course over the next few days. Patient counseled for the need to abstain from drinking alcohol.  Patient will be seen by Dr. Darrick Penna over the weekend.   LOS: 2 days   Najeeb Rehman  07/31/2017, 4:32 PM

## 2017-07-31 NOTE — Progress Notes (Signed)
Pharmacy Antibiotic Note  Brady Bell is a 62 y.o. male admitted on 07/29/2017 with intra-abdominal.  Pharmacy has been consulted for Zosyn dosing.  Plan: Zosyn 3.375g IV q8h (4 hour infusion).  F/U cxs and clinical progress Monitor V/S, labs  Height:  (177.8 cm) Weight: 149 lb 4 oz (67.7 kg) IBW/kg (Calculated) : 73  Temp (24hrs), Avg:98 F (36.7 C), Min:97.6 F (36.4 C), Max:98.2 F (36.8 C)  Recent Labs  Lab 07/29/17 1541 07/30/17 0452 07/31/17 0422  WBC 8.9 8.4 8.0  CREATININE 0.70 0.61 0.53*    Estimated Creatinine Clearance: 95.2 mL/min (A) (by C-G formula based on SCr of 0.53 mg/dL (L)).    No Known Allergies  Antimicrobials this admission: Zosyn 5/3 >>   Dose adjustments this admission: n/a  Microbiology results: 4/26 UCx: no growth  4/26  MRSA PCR: negative  Thank you for allowing pharmacy to be a part of this patient's care.  Elder Cyphers, BS Pharm D, [place] Clinical Pharmacist Pager [phone] 07/31/2017 8:33 AM

## 2017-07-31 NOTE — Progress Notes (Addendum)
Subjective: He says he feels okay.  He still has abdominal pain.  No further episodes of atrial flutter.  No other new complaints.  His pain did not increase with clear liquids  Objective: Vital signs in last 24 hours: Temp:  [97.6 F (36.4 C)-98.2 F (36.8 C)] 98.1 F (36.7 C) (05/03 0740) Pulse Rate:  [37-73] 66 (05/03 0740) Resp:  [15-30] 28 (05/03 0740) BP: (107-151)/(72-96) 120/72 (05/03 0532) Weight:  [67.7 kg (149 lb 4 oz)] 67.7 kg (149 lb 4 oz) (05/03 0500) Weight change: 3.743 kg (8 lb 4 oz) Last BM Date: 07/30/17  Intake/Output from previous day: 05/02 0701 - 05/03 0700 In: 1756.4 [I.V.:1756.4] Out: 1300 [Urine:1300]  PHYSICAL EXAM General appearance: alert, cooperative and no distress Resp: clear to auscultation bilaterally Cardio: regular rate and rhythm, S1, S2 normal, no murmur, click, rub or gallop GI: He still has abdominal tenderness most marked in the epigastric region Extremities: extremities normal, atraumatic, no cyanosis or edema  Lab Results:  Results for orders placed or performed during the hospital encounter of 07/29/17 (from the past 48 hour(s))  Urinalysis, Routine w reflex microscopic     Status: Abnormal   Collection Time: 07/29/17  3:00 PM  Result Value Ref Range   Color, Urine YELLOW YELLOW   APPearance CLEAR CLEAR   Specific Gravity, Urine 1.006 1.005 - 1.030   pH 7.0 5.0 - 8.0   Glucose, UA NEGATIVE NEGATIVE mg/dL   Hgb urine dipstick NEGATIVE NEGATIVE   Bilirubin Urine NEGATIVE NEGATIVE   Ketones, ur 5 (A) NEGATIVE mg/dL   Protein, ur NEGATIVE NEGATIVE mg/dL   Nitrite NEGATIVE NEGATIVE   Leukocytes, UA NEGATIVE NEGATIVE    Comment: Performed at [facility], [address], [place], [place] [address]  Lipase, blood     Status: Abnormal   Collection Time: 07/29/17  3:41 PM  Result Value Ref Range   Lipase 151 (H) 11 - 51 U/L    Comment: Performed at [facility], [address] [place], [place] [address]  Comprehensive metabolic  panel     Status: Abnormal   Collection Time: 07/29/17  3:41 PM  Result Value Ref Range   Sodium 137 135 - 145 mmol/L   Potassium 2.1 (LL) 3.5 - 5.1 mmol/L    Comment: CRITICAL RESULT CALLED TO, READ BACK BY AND VERIFIED WITH: TRIPLETT,T ON 07/29/17 AT 1650 BY LOY,C    Chloride 92 (L) 101 - 111 mmol/L   CO2 29 22 - 32 mmol/L   Glucose, Bld 108 (H) 65 - 99 mg/dL   BUN 8 6 - 20 mg/dL   Creatinine, Ser 0.70 0.61 - 1.24 mg/dL   Calcium 8.9 8.9 - 10.3 mg/dL   Total Protein 6.7 6.5 - 8.1 g/dL   Albumin 2.9 (L) 3.5 - 5.0 g/dL   AST 30 15 - 41 U/L   ALT 17 17 - 63 U/L   Alkaline Phosphatase 98 38 - 126 U/L   Total Bilirubin 0.7 0.3 - 1.2 mg/dL   GFR calc non Af Amer >60 >60 mL/min   GFR calc Af Amer >60 >60 mL/min    Comment: (NOTE) The eGFR has been calculated using the CKD EPI equation. This calculation has not been validated in all clinical situations. eGFR's persistently <60 mL/min signify possible Chronic Kidney Disease.    Anion gap 16 (H) 5 - 15    Comment: Performed at [facility], [address], [place], [place] [address]  CBC with Differential  Status: Abnormal   Collection Time: 07/29/17  3:41 PM  Result Value Ref Range   WBC 8.9 4.0 - 10.5 K/uL   RBC 4.32 4.22 - 5.81 MIL/uL   Hemoglobin 14.2 13.0 - 17.0 g/dL   HCT 41.8 39.0 - 52.0 %   MCV 96.8 78.0 - 100.0 fL   MCH 32.9 26.0 - 34.0 pg   MCHC 34.0 30.0 - 36.0 g/dL   RDW 13.4 11.5 - 15.5 %   Platelets 348 150 - 400 K/uL   Neutrophils Relative % 63 %   Neutro Abs 5.6 1.7 - 7.7 K/uL   Lymphocytes Relative 15 %   Lymphs Abs 1.4 0.7 - 4.0 K/uL   Monocytes Relative 19 %   Monocytes Absolute 1.7 (H) 0.1 - 1.0 K/uL   Eosinophils Relative 2 %   Eosinophils Absolute 0.2 0.0 - 0.7 K/uL   Basophils Relative 1 %   Basophils Absolute 0.1 0.0 - 0.1 K/uL    Comment: Performed at [facility], [address], [place], [place] [address]  Magnesium     Status: None   Collection Time: 07/29/17  6:08 PM  Result Value Ref Range    Magnesium 1.8 1.7 - 2.4 mg/dL    Comment: Performed at [facility], [address], [place], [place] [address]  Protime-INR     Status: None   Collection Time: 07/29/17  9:00 PM  Result Value Ref Range   Prothrombin Time 13.4 11.4 - 15.2 seconds   INR 1.03     Comment: Performed at [facility], [address] [place], [place] [address]  APTT     Status: Abnormal   Collection Time: 07/29/17  9:00 PM  Result Value Ref Range   aPTT 37 (H) 24 - 36 seconds    Comment:        IF BASELINE aPTT IS ELEVATED, SUGGEST PATIENT RISK ASSESSMENT BE USED TO DETERMINE APPROPRIATE ANTICOAGULANT THERAPY. Performed at [facility], [address], [place], [place] [address]   Heparin level (unfractionated)     Status: Abnormal   Collection Time: 07/30/17  4:52 AM  Result Value Ref Range   Heparin Unfractionated 0.20 (L) 0.30 - 0.70 IU/mL    Comment:        IF HEPARIN RESULTS ARE BELOW EXPECTED VALUES, AND PATIENT DOSAGE HAS BEEN CONFIRMED, SUGGEST FOLLOW UP TESTING OF ANTITHROMBIN III LEVELS. Performed at [facility], [address], [place], [place] [address]   CBC     Status: Abnormal   Collection Time: 07/30/17  4:52 AM  Result Value Ref Range   WBC 8.4 4.0 - 10.5 K/uL   RBC 3.63 (L) 4.22 - 5.81 MIL/uL   Hemoglobin 12.2 (L) 13.0 - 17.0 g/dL   HCT 35.5 (L) 39.0 - 52.0 %   MCV 97.8 78.0 - 100.0 fL   MCH 33.6 26.0 - 34.0 pg   MCHC 34.4 30.0 - 36.0 g/dL   RDW 13.6 11.5 - 15.5 %   Platelets 283 150 - 400 K/uL    Comment: Performed at [facility], [address], [place], [place] [address]  Comprehensive metabolic panel     Status: Abnormal   Collection Time: 07/30/17  4:52 AM  Result Value Ref Range   Sodium 138 135 - 145 mmol/L   Potassium 3.1 (L) 3.5 - 5.1 mmol/L    Comment: DELTA CHECK NOTED   Chloride 102 101 - 111 mmol/L   CO2 26 22 - 32 mmol/L   Glucose, Bld 97 65 -  99 mg/dL   BUN 8 6 - 20 mg/dL   Creatinine, Ser 0.61 0.61 - 1.24 mg/dL   Calcium 8.0 (L) 8.9 - 10.3 mg/dL    Total Protein 5.3 (L) 6.5 - 8.1 g/dL   Albumin 2.3 (L) 3.5 - 5.0 g/dL   AST 26 15 - 41 U/L   ALT 16 (L) 17 - 63 U/L   Alkaline Phosphatase 78 38 - 126 U/L   Total Bilirubin 0.8 0.3 - 1.2 mg/dL   GFR calc non Af Amer >60 >60 mL/min   GFR calc Af Amer >60 >60 mL/min    Comment: (NOTE) The eGFR has been calculated using the CKD EPI equation. This calculation has not been validated in all clinical situations. eGFR's persistently <60 mL/min signify possible Chronic Kidney Disease.    Anion gap 10 5 - 15    Comment: Performed at [facility], [address], [place], [place] [address]  Lipase, blood     Status: Abnormal   Collection Time: 07/30/17  4:52 AM  Result Value Ref Range   Lipase 93 (H) 11 - 51 U/L    Comment: Performed at [facility], [address] [place], [place] [address]  Heparin level (unfractionated)     Status: Abnormal   Collection Time: 07/30/17  2:33 PM  Result Value Ref Range   Heparin Unfractionated 0.25 (L) 0.30 - 0.70 IU/mL    Comment:        IF HEPARIN RESULTS ARE BELOW EXPECTED VALUES, AND PATIENT DOSAGE HAS BEEN CONFIRMED, SUGGEST FOLLOW UP TESTING OF ANTITHROMBIN III LEVELS. Performed at [facility], [address] [place], [place] [address]   Heparin level (unfractionated)     Status: None   Collection Time: 07/30/17 10:31 PM  Result Value Ref Range   Heparin Unfractionated 0.49 0.30 - 0.70 IU/mL    Comment:        IF HEPARIN RESULTS ARE BELOW EXPECTED VALUES, AND PATIENT DOSAGE HAS BEEN CONFIRMED, SUGGEST FOLLOW UP TESTING OF ANTITHROMBIN III LEVELS. Performed at [facility], [address] [place], [place] [address]   Heparin level (unfractionated)     Status: None   Collection Time: 07/31/17  4:22 AM  Result Value Ref Range   Heparin Unfractionated 0.45 0.30 - 0.70 IU/mL    Comment:        IF HEPARIN RESULTS ARE BELOW EXPECTED VALUES, AND PATIENT DOSAGE HAS BEEN CONFIRMED, SUGGEST FOLLOW UP TESTING OF ANTITHROMBIN III  LEVELS. Performed at [facility], [address] [place], [place] [address]   CBC     Status: Abnormal   Collection Time: 07/31/17  4:22 AM  Result Value Ref Range   WBC 8.0 4.0 - 10.5 K/uL   RBC 3.73 (L) 4.22 - 5.81 MIL/uL   Hemoglobin 12.5 (L) 13.0 - 17.0 g/dL   HCT 36.3 (L) 39.0 - 52.0 %   MCV 97.3 78.0 - 100.0 fL   MCH 33.5 26.0 - 34.0 pg   MCHC 34.4 30.0 - 36.0 g/dL   RDW 13.5 11.5 - 15.5 %   Platelets 285 150 - 400 K/uL    Comment: Performed at [facility], [address], [place], [place] [address]  Basic metabolic panel     Status: Abnormal   Collection Time: 07/31/17  4:22 AM  Result Value Ref Range   Sodium 137 135 - 145 mmol/L   Potassium 3.3 (L) 3.5 - 5.1 mmol/L   Chloride 104 101 - 111 mmol/L   CO2 24 22 -  32 mmol/L   Glucose, Bld 100 (H) 65 - 99 mg/dL   BUN <5 (L) 6 - 20 mg/dL   Creatinine, Ser 0.53 (L) 0.61 - 1.24 mg/dL   Calcium 8.0 (L) 8.9 - 10.3 mg/dL   GFR calc non Af Amer >60 >60 mL/min   GFR calc Af Amer >60 >60 mL/min    Comment: (NOTE) The eGFR has been calculated using the CKD EPI equation. This calculation has not been validated in all clinical situations. eGFR's persistently <60 mL/min signify possible Chronic Kidney Disease.    Anion gap 9 5 - 15    Comment: Performed at [facility], [address] [place], [place] [address]  Lipase, blood     Status: Abnormal   Collection Time: 07/31/17  4:22 AM  Result Value Ref Range   Lipase 62 (H) 11 - 51 U/L    Comment: Performed at [facility], [address], [place], [place] [address]    ABGS No results for input(s): PHART, PO2ART, TCO2, HCO3 in the last 72 hours.  Invalid input(s): PCO2 CULTURES Recent Results (from the past 240 hour(s))  Urine culture     Status: None   Collection Time: 07/22/17  4:37 AM  Result Value Ref Range Status   Specimen Description   Final    URINE, CLEAN CATCH Performed at [facility], [address], [place], [place] [address]    Special Requests   Final     Normal Performed at [facility], [address], [place], [place] [address]    Culture   Final    NO GROWTH Performed at [facility], [place] [address], [place], [place] [address]    Report Status 07/24/2017 FINAL  Final  MRSA PCR Screening     Status: None   Collection Time: 07/24/17 10:20 PM  Result Value Ref Range Status   MRSA by PCR NEGATIVE NEGATIVE Final    Comment:        The GeneXpert MRSA Assay (FDA approved for NASAL specimens only), is one component of a comprehensive MRSA colonization surveillance program. It is not intended to diagnose MRSA infection nor to guide or monitor treatment for MRSA infections. Performed at [facility], [address] [place], [place] [address]    Studies/Results: Ct Abdomen Pelvis W Wo Contrast  Result Date: 07/30/2017 CLINICAL DATA:  62 year old male with history of upper abdominal pain and nausea for several days. Elevated lipase. EXAM: CT ABDOMEN AND PELVIS WITHOUT AND WITH CONTRAST TECHNIQUE: Multidetector CT imaging of the abdomen and pelvis was performed following the standard protocol before and following the bolus administration of intravenous contrast. CONTRAST:  131m ISOVUE-300 IOPAMIDOL (ISOVUE-300) INJECTION 61% COMPARISON:  CT the abdomen and pelvis 07/22/2017. FINDINGS: Lower chest: Small to moderate bilateral pleural effusions lying dependently with some associated passive atelectasis in the lower lobes of the lungs bilaterally. Emphysematous changes throughout the visualized lung bases. Atherosclerotic calcifications in the left anterior descending coronary arteries. Hepatobiliary: Focal fatty infiltration in segment 4A and 4B of the liver adjacent to the falciform ligament. No other suspicious cystic or solid hepatic lesions. No intra or extrahepatic biliary ductal dilatation. Gallbladder is under distended, but the gallbladder wall appears thickened measuring up to 9 mm, and demonstrates slight haziness in the adjacent fat,  which could indicate inflammation. No definite calcified gallstones are identified. Pancreas: No definite pancreatic mass. No pancreatic ductal dilatation. Large amount of peripancreatic fluid and inflammatory changes surrounding the body and tail of the pancreas, indicative  of an acute pancreatitis, significantly increased compared to the prior study. Pancreatic parenchyma appears to enhance normally. Spleen: Numerous calcified granulomas scattered throughout the spleen. Adrenals/Urinary Tract: Bilateral kidneys and bilateral adrenal glands are normal in appearance. No hydroureteronephrosis. Urinary bladder is normal in appearance. Bilateral adrenal glands are normal in appearance. Stomach/Bowel: Normal appearance of the stomach. No pathologic dilatation of small bowel or colon. The appendix is not confidently identified and may be surgically absent. Regardless, there are no inflammatory changes noted adjacent to the cecum to suggest the presence of an acute appendicitis at this time. Vascular/Lymphatic: Aortic atherosclerosis, without evidence of aneurysm or dissection in the abdominal or pelvic vasculature. Bypass graft extending from the left superficial femoral artery (left femoral-tibial bypass graft) in the upper left thigh incompletely imaged. There continues to be some circumferential low attenuation with surrounding rim enhancement and adjacent haziness in the perivascular fat associated with this area, which was a site of left iliofemoral endarterectomy with bovine patch angioplasty on 04/28/2017. 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 Circumaortic left renal vein (normal anatomical variant) incidentally noted. No lymphadenopathy noted in the abdomen or pelvis. Reproductive: Prostate gland and seminal vesicles are unremarkable in appearance. Other: No significant volume of ascites.  No pneumoperitoneum. Musculoskeletal: No aggressive  appearing lytic or blastic lesions are noted in the visualized portions of the skeleton. IMPRESSION: 1. Today's study demonstrates worsening inflammatory changes and increasing peripancreatic fluid adjacent to the body and tail of the pancreas, indicative of persistent acute pancreatitis. These fluid collections appear to be partially organizing, likely developing in to pancreatic pseudocysts, although no well defined completely organized collection is identified at this time. 2. Gallbladder is partially contracted but demonstrates some mural thickening and slight haziness in the pericholecystic fat. No calcified gallstones are noted. Findings are concerning for acute acalculous cholecystitis, as suggested on prior ultrasound examination. 3. Postoperative changes related to prior left iliofemoral endarterectomy and bovine patch angioplasty again noted. This may simply reflect postoperative healing, however, given the subtle inflammatory changes in the fat adjacent to the surgical site, clinical correlation to exclude infection is recommended. 4. Small to moderate bilateral pleural effusions lying dependently, new compared to the prior study. This is associated with areas of passive atelectasis in the visualize lung bases. 5. Aortic atherosclerosis. 6. Additional incidental findings, as above. Aortic Atherosclerosis (ICD10-I70.0). Electronically Signed   By: Vinnie Langton M.D.   On: 07/30/2017 10:42    Medications:  Prior to Admission:  Medications Prior to Admission  Medication Sig Dispense Refill Last Dose  . aspirin EC 81 MG tablet Take 81 mg by mouth daily.   07/29/2017 at Unknown time  . cycloSPORINE (RESTASIS) 0.05 % ophthalmic emulsion Place 1 drop into both eyes 2 (two) times daily.   07/29/2017 at Unknown time  . divalproex (DEPAKOTE ER) 500 MG 24 hr tablet Take 1 tablet (500 mg total) at bedtime by mouth. (Patient taking differently: Take 750 mg by mouth at bedtime. ) 90 tablet 4 07/27/2017 at  Unknown time  . eletriptan (RELPAX) 40 MG tablet Take 40 mg by mouth every 2 (two) hours as needed for migraine or headache. May repeat in 2 hours if headache persists or recurs.    07/28/2017 at Unknown time  . fluticasone (FLONASE) 50 MCG/ACT nasal spray Place 2 sprays into both nostrils daily.   07/29/2017 at Unknown time  . gabapentin (NEURONTIN) 300 MG capsule Take 300 mg by mouth daily.  07/29/2017 at Unknown time  . Multiple Vitamin (MULTIVITAMIN WITH MINERALS) TABS tablet Take 1 tablet by mouth daily.   07/29/2017 at Unknown time  . naproxen sodium (ALEVE) 220 MG tablet Take 220 mg by mouth 2 (two) times daily as needed (takes 1 tablet every morning and again later if needed for pain). Takes 1 tablet every morning    Past Week at Unknown time  . SUMAtriptan (IMITREX STATDOSE SYSTEM) 6 MG/0.5ML SOAJ Inject 6 mg into the skin as needed. (Patient taking differently: Inject 6 mg into the skin as needed. ) 12 Cartridge 11 unknown  . SUMAtriptan (IMITREX) 100 MG tablet Take 1 tablet (100 mg total) by mouth every 2 (two) hours as needed for migraine. May repeat in 2 hours if headache persists or recurs. 12 tablet 11 unknown   Scheduled: . aspirin EC  81 mg Oral Daily  . cycloSPORINE  1 drop Both Eyes BID  . diltiazem  30 mg Oral Q6H  . divalproex  750 mg Oral QHS  . fluticasone  2 spray Each Nare Daily  . folic acid  1 mg Oral Daily  . gabapentin  300 mg Oral Daily  . multivitamin with minerals  1 tablet Oral Daily  . thiamine  100 mg Oral Daily   Or  . thiamine  100 mg Intravenous Daily   Continuous: . sodium chloride 10 mL/hr at 07/31/17 0000  . sodium chloride 75 mL/hr at 07/31/17 0000  . heparin 1,400 Units/hr (07/31/17 0736)   TWS:[id number] **OR** acetaminophen, hydrALAZINE, LORazepam **OR** LORazepam, morphine injection, ondansetron **OR** ondansetron (ZOFRAN) IV  Assesment: He was admitted with atrial flutter but has converted to sinus rhythm and has not had any further episodes  of atrial flutter.  This is his second episode both in the face of acute illness but I still want to have cardiology take a look at him and see if we need to do anything about this.  He has acute pancreatitis.  He was admitted with acute pancreatitis last week and checked out AMA when he developed atrial flutter.  His CT yesterday looks worse than the one he had during his last admission.  There is question of possible pseudocyst formation.  There is also question of possible cholecystitis.  His lipase has come down from 150 on admission to 60 today  He has chronic alcohol abuse and yesterday look like he may be developing DTs.  Today he looks better.  He is on protocol  He has a longtime nicotine abuse and CT is consistent with emphysema  He has peripheral arterial disease and has had surgery  on his left leg  He is still hypokalemic and this is being replaced  He is high risk for significant deterioration Active Problems:   Atrial flutter ([place])    Plan: Continue treatments.  Start Zosyn because of the issue with the gallbladder.  Request general surgical and GI consultation    LOS: 2 days   Eva Vallee L 07/31/2017, 8:06 AM

## 2017-07-31 NOTE — Consult Note (Signed)
Reason for Consult: Pancreatitis, cholelithiasis Referring Physician: Dr. Renelda Loma is an 62 y.o. male.  HPI: Patient is a 62 year old white male who was admitted to [facility] with nausea and vomiting which was recurrent in nature.  He had previously been in the hospital in late April for a similar problem.  He was diagnosed with pancreatitis at that time.  He does have a history of known cholelithiasis.  He does suffer from chronic alcohol abuse and has been treated for DTs.  He left AMA on 07/24/2017 but presented back to the emergency room on 07/29/2017 with recurrent symptoms.  Repeat CT scan of the abdomen revealed worsening pancreatitis.  In addition, he has had episodes of atrial flutter.  He was noted to be severely hypokalemic at 2.1.  His lipase was elevated 151.  Today, he denies any significant right upper quadrant abdominal pain.  He seems to be more alert and oriented today.  Past Medical History:  Diagnosis Date  . Chronic ankle pain   . Chronic knee pain   . GERD (gastroesophageal reflux disease)   . Hepatitis    Hep A in 1980's  . Hypoglycemia   . Migraines   . Peripheral vascular disease ([place])   . Recurrent sinus infections   . Seizures ([place])   . Shingles     Past Surgical History:  Procedure Laterality Date  . ABDOMINAL AORTOGRAM N/A 04/23/2017   Procedure: ABDOMINAL AORTOGRAM;  Surgeon: Conrad [place], MD;  Location: [place] [facility];  Service: Cardiovascular;  Laterality: N/A;  . ENDARTERECTOMY FEMORAL Left 04/28/2017   Procedure: ENDARTERECTOMY LEFT ILIO-FEMORAL ARTERY;  Surgeon: Conrad Lake T[place], MD;  Location: [place];  Service: Vascular;  Laterality: Left;  . FASCIOTOMY  09/06/2011   Procedure: FASCIOTOMY;  Surgeon: Conrad Burt, MD;  Location: [place];  Service: Vascular;  Laterality: Left;  . FEMORAL-TIBIAL BYPASS GRAFT  09/06/2011   Procedure: BYPASS GRAFT FEMORAL-TIBIAL ARTERY;  Surgeon: Conrad Liberty, MD;  Location: [place];  Service: Vascular;   Laterality: Left;  . KNEE CARTILAGE SURGERY Left   . LOWER EXTREMITY ANGIOGRAM N/A 09/05/2011   Procedure: LOWER EXTREMITY ANGIOGRAM;  Surgeon: Elam Dutch, MD;  Location: [facility] CATH LAB;  Service: Cardiovascular;  Laterality: N/A;  . LOWER EXTREMITY ANGIOGRAM Left 04/15/2012   Procedure: LOWER EXTREMITY ANGIOGRAM;  Surgeon: Conrad Yoder, MD;  Location: [facility] CATH LAB;  Service: Cardiovascular;  Laterality: Left;  . LOWER EXTREMITY ANGIOGRAPHY Bilateral 04/23/2017   Procedure: Lower Extremity Angiography;  Surgeon: Conrad [place], MD;  Location: [place] [facility];  Service: Cardiovascular;  Laterality: Bilateral;  . PATCH ANGIOPLASTY Left 04/28/2017   Procedure: ILIO-FEMORAL ARTERY PATCH ANGIOPLASTY USING Rueben Bash BIOLOGIC PATCH;  Surgeon: Conrad , MD;  Location: [place];  Service: Vascular;  Laterality: Left;  . PR VEIN BYPASS GRAFT,AORTO-FEM-POP  09/06/11   Left   . TOOTH EXTRACTION  June-July-Aug. 2015   Several     Family History  Problem Relation Age of Onset  . Alcohol abuse Father   . Kidney failure Father   . Other Mother        "age"  . Diabetes Unknown     Social History:  reports that he has been smoking cigarettes.  He has a 15.00 pack-year smoking history. He has never used smokeless tobacco. He reports that he drinks alcohol. He reports that he has current or past drug history. Drug: Marijuana.  Allergies: No Known Allergies  Medications:  I have reviewed the patient's current medications.  Results for orders placed or performed during the hospital encounter of 07/29/17 (from the past 48 hour(s))  Urinalysis, Routine w reflex microscopic     Status: Abnormal   Collection Time: 07/29/17  3:00 PM  Result Value Ref Range   Color, Urine YELLOW YELLOW   APPearance CLEAR CLEAR   Specific Gravity, Urine 1.006 1.005 - 1.030   pH 7.0 5.0 - 8.0   Glucose, UA NEGATIVE NEGATIVE mg/dL   Hgb urine dipstick NEGATIVE NEGATIVE   Bilirubin Urine NEGATIVE NEGATIVE   Ketones, ur 5 (A)  NEGATIVE mg/dL   Protein, ur NEGATIVE NEGATIVE mg/dL   Nitrite NEGATIVE NEGATIVE   Leukocytes, UA NEGATIVE NEGATIVE    Comment: Performed at [facility], [address], [place], [place] [address]  Lipase, blood     Status: Abnormal   Collection Time: 07/29/17  3:41 PM  Result Value Ref Range   Lipase 151 (H) 11 - 51 U/L    Comment: Performed at [facility], [address], [place], [place] [address]  Comprehensive metabolic panel     Status: Abnormal   Collection Time: 07/29/17  3:41 PM  Result Value Ref Range   Sodium 137 135 - 145 mmol/L   Potassium 2.1 (LL) 3.5 - 5.1 mmol/L    Comment: CRITICAL RESULT CALLED TO, READ BACK BY AND VERIFIED WITH: TRIPLETT,T ON 07/29/17 AT 1650 BY LOY,C    Chloride 92 (L) 101 - 111 mmol/L   CO2 29 22 - 32 mmol/L   Glucose, Bld 108 (H) 65 - 99 mg/dL   BUN 8 6 - 20 mg/dL   Creatinine, Ser 0.70 0.61 - 1.24 mg/dL   Calcium 8.9 8.9 - 10.3 mg/dL   Total Protein 6.7 6.5 - 8.1 g/dL   Albumin 2.9 (L) 3.5 - 5.0 g/dL   AST 30 15 - 41 U/L   ALT 17 17 - 63 U/L   Alkaline Phosphatase 98 38 - 126 U/L   Total Bilirubin 0.7 0.3 - 1.2 mg/dL   GFR calc non Af Amer >60 >60 mL/min   GFR calc Af Amer >60 >60 mL/min    Comment: (NOTE) The eGFR has been calculated using the CKD EPI equation. This calculation has not been validated in all clinical situations. eGFR's persistently <60 mL/min signify possible Chronic Kidney Disease.    Anion gap 16 (H) 5 - 15    Comment: Performed at [facility], [address], [place], [place] [address]  CBC with Differential     Status: Abnormal   Collection Time: 07/29/17  3:41 PM  Result Value Ref Range   WBC 8.9 4.0 - 10.5 K/uL   RBC 4.32 4.22 - 5.81 MIL/uL   Hemoglobin 14.2 13.0 - 17.0 g/dL   HCT 41.8 39.0 - 52.0 %   MCV 96.8 78.0 - 100.0 fL   MCH 32.9 26.0 - 34.0 pg   MCHC 34.0 30.0 - 36.0 g/dL   RDW 13.4 11.5 - 15.5 %   Platelets 348 150 - 400 K/uL   Neutrophils Relative % 63 %   Neutro Abs 5.6 1.7 - 7.7 K/uL    Lymphocytes Relative 15 %   Lymphs Abs 1.4 0.7 - 4.0 K/uL   Monocytes Relative 19 %   Monocytes Absolute 1.7 (H) 0.1 - 1.0 K/uL   Eosinophils Relative 2 %   Eosinophils Absolute 0.2 0.0 - 0.7 K/uL   Basophils Relative 1 %   Basophils Absolute 0.1 0.0 -  0.1 K/uL    Comment: Performed at [facility], [address] [place], [place] [address]  Magnesium     Status: None   Collection Time: 07/29/17  6:08 PM  Result Value Ref Range   Magnesium 1.8 1.7 - 2.4 mg/dL    Comment: Performed at [facility], [address], [place], [place] [address]  Protime-INR     Status: None   Collection Time: 07/29/17  9:00 PM  Result Value Ref Range   Prothrombin Time 13.4 11.4 - 15.2 seconds   INR 1.03     Comment: Performed at [facility], [address], [place], [place] [address]  APTT     Status: Abnormal   Collection Time: 07/29/17  9:00 PM  Result Value Ref Range   aPTT 37 (H) 24 - 36 seconds    Comment:        IF BASELINE aPTT IS ELEVATED, SUGGEST PATIENT RISK ASSESSMENT BE USED TO DETERMINE APPROPRIATE ANTICOAGULANT THERAPY. Performed at [facility], [address] [place], [place] [address]   Heparin level (unfractionated)     Status: Abnormal   Collection Time: 07/30/17  4:52 AM  Result Value Ref Range   Heparin Unfractionated 0.20 (L) 0.30 - 0.70 IU/mL    Comment:        IF HEPARIN RESULTS ARE BELOW EXPECTED VALUES, AND PATIENT DOSAGE HAS BEEN CONFIRMED, SUGGEST FOLLOW UP TESTING OF ANTITHROMBIN III LEVELS. Performed at [facility], [address] [place], [place] [address]   CBC     Status: Abnormal   Collection Time: 07/30/17  4:52 AM  Result Value Ref Range   WBC 8.4 4.0 - 10.5 K/uL   RBC 3.63 (L) 4.22 - 5.81 MIL/uL   Hemoglobin 12.2 (L) 13.0 - 17.0 g/dL   HCT 35.5 (L) 39.0 - 52.0 %   MCV 97.8 78.0 - 100.0 fL   MCH 33.6 26.0 - 34.0 pg   MCHC 34.4 30.0 - 36.0 g/dL   RDW 13.6 11.5 - 15.5 %   Platelets 283 150 - 400 K/uL    Comment: Performed at [facility], [address] [place], [place] [address]  Comprehensive metabolic panel     Status: Abnormal   Collection Time: 07/30/17  4:52 AM  Result Value Ref Range   Sodium 138 135 - 145 mmol/L   Potassium 3.1 (L) 3.5 - 5.1 mmol/L    Comment: DELTA CHECK NOTED   Chloride 102 101 - 111 mmol/L   CO2 26 22 - 32 mmol/L   Glucose, Bld 97 65 - 99 mg/dL   BUN 8 6 - 20 mg/dL   Creatinine, Ser 0.61 0.61 - 1.24 mg/dL   Calcium 8.0 (L) 8.9 - 10.3 mg/dL   Total Protein 5.3 (L) 6.5 - 8.1 g/dL   Albumin 2.3 (L) 3.5 - 5.0 g/dL   AST 26 15 - 41 U/L   ALT 16 (L) 17 - 63 U/L   Alkaline Phosphatase 78 38 - 126 U/L   Total Bilirubin 0.8 0.3 - 1.2 mg/dL   GFR calc non Af Amer >60 >60 mL/min   GFR calc Af Amer >60 >60 mL/min    Comment: (NOTE) The eGFR has been calculated using the CKD EPI equation. This calculation has not been validated in all clinical situations. eGFR's persistently <60 mL/min signify possible Chronic Kidney Disease.    Anion gap 10 5 - 15    Comment: Performed at [facility], [address], [place], [place] [address]  Lipase,  blood     Status: Abnormal   Collection Time: 07/30/17  4:52 AM  Result Value Ref Range   Lipase 93 (H) 11 - 51 U/L    Comment: Performed at [facility], [address], [place], [place] [address]  Heparin level (unfractionated)     Status: Abnormal   Collection Time: 07/30/17  2:33 PM  Result Value Ref Range   Heparin Unfractionated 0.25 (L) 0.30 - 0.70 IU/mL    Comment:        IF HEPARIN RESULTS ARE BELOW EXPECTED VALUES, AND PATIENT DOSAGE HAS BEEN CONFIRMED, SUGGEST FOLLOW UP TESTING OF ANTITHROMBIN III LEVELS. Performed at [facility], [address] [place], [place] [address]   Heparin level (unfractionated)     Status: None   Collection Time: 07/30/17 10:31 PM  Result Value Ref Range   Heparin Unfractionated 0.49 0.30 - 0.70 IU/mL    Comment:        IF HEPARIN RESULTS ARE BELOW EXPECTED VALUES, AND PATIENT DOSAGE HAS BEEN CONFIRMED, SUGGEST FOLLOW UP  TESTING OF ANTITHROMBIN III LEVELS. Performed at [facility], [address] [place], [place] [address]   Heparin level (unfractionated)     Status: None   Collection Time: 07/31/17  4:22 AM  Result Value Ref Range   Heparin Unfractionated 0.45 0.30 - 0.70 IU/mL    Comment:        IF HEPARIN RESULTS ARE BELOW EXPECTED VALUES, AND PATIENT DOSAGE HAS BEEN CONFIRMED, SUGGEST FOLLOW UP TESTING OF ANTITHROMBIN III LEVELS. Performed at [facility], [address] [place], [place] [address]   CBC     Status: Abnormal   Collection Time: 07/31/17  4:22 AM  Result Value Ref Range   WBC 8.0 4.0 - 10.5 K/uL   RBC 3.73 (L) 4.22 - 5.81 MIL/uL   Hemoglobin 12.5 (L) 13.0 - 17.0 g/dL   HCT 36.3 (L) 39.0 - 52.0 %   MCV 97.3 78.0 - 100.0 fL   MCH 33.5 26.0 - 34.0 pg   MCHC 34.4 30.0 - 36.0 g/dL   RDW 13.5 11.5 - 15.5 %   Platelets 285 150 - 400 K/uL    Comment: Performed at [facility], [address], [place], [place] [address]  Basic metabolic panel     Status: Abnormal   Collection Time: 07/31/17  4:22 AM  Result Value Ref Range   Sodium 137 135 - 145 mmol/L   Potassium 3.3 (L) 3.5 - 5.1 mmol/L   Chloride 104 101 - 111 mmol/L   CO2 24 22 - 32 mmol/L   Glucose, Bld 100 (H) 65 - 99 mg/dL   BUN <5 (L) 6 - 20 mg/dL   Creatinine, Ser 0.53 (L) 0.61 - 1.24 mg/dL   Calcium 8.0 (L) 8.9 - 10.3 mg/dL   GFR calc non Af Amer >60 >60 mL/min   GFR calc Af Amer >60 >60 mL/min    Comment: (NOTE) The eGFR has been calculated using the CKD EPI equation. This calculation has not been validated in all clinical situations. eGFR's persistently <60 mL/min signify possible Chronic Kidney Disease.    Anion gap 9 5 - 15    Comment: Performed at [facility], [address], [place], [place] [address]  Lipase, blood     Status: Abnormal   Collection Time: 07/31/17  4:22 AM  Result Value Ref Range   Lipase 62 (H) 11 - 51 U/L    Comment: Performed at [facility], [address] [place], [place]  [address]     Ct Abdomen Pelvis W Wo Contrast  Result Date: 07/30/2017 CLINICAL DATA:  62 year old male with history of upper abdominal pain and nausea for several days. Elevated lipase. EXAM: CT ABDOMEN AND PELVIS WITHOUT AND WITH CONTRAST TECHNIQUE: Multidetector CT imaging of the abdomen and pelvis was performed following the standard protocol before and following the bolus administration of intravenous contrast. CONTRAST:  132m ISOVUE-300 IOPAMIDOL (ISOVUE-300) INJECTION 61% COMPARISON:  CT the abdomen and pelvis 07/22/2017. FINDINGS: Lower chest: Small to moderate bilateral pleural effusions lying dependently with some associated passive atelectasis in the lower lobes of the lungs bilaterally. Emphysematous changes throughout the visualized lung bases. Atherosclerotic calcifications in the left anterior descending coronary arteries. Hepatobiliary: Focal fatty infiltration in segment 4A and 4B of the liver adjacent to the falciform ligament. No other suspicious cystic or solid hepatic lesions. No intra or extrahepatic biliary ductal dilatation. Gallbladder is under distended, but the gallbladder wall appears thickened measuring up to 9 mm, and demonstrates slight haziness in the adjacent fat, which could indicate inflammation. No definite calcified gallstones are identified. Pancreas: No definite pancreatic mass. No pancreatic ductal dilatation. Large amount of peripancreatic fluid and inflammatory changes surrounding the body and tail of the pancreas, indicative of an acute pancreatitis, significantly increased compared to the prior study. Pancreatic parenchyma appears to enhance normally. Spleen: Numerous calcified granulomas scattered throughout the spleen. Adrenals/Urinary Tract: Bilateral kidneys and bilateral adrenal glands are normal in appearance. No hydroureteronephrosis. Urinary bladder is normal in appearance. Bilateral adrenal glands are normal in appearance. Stomach/Bowel: Normal appearance of the  stomach. No pathologic dilatation of small bowel or colon. The appendix is not confidently identified and may be surgically absent. Regardless, there are no inflammatory changes noted adjacent to the cecum to suggest the presence of an acute appendicitis at this time. Vascular/Lymphatic: Aortic atherosclerosis, without evidence of aneurysm or dissection in the abdominal or pelvic vasculature. Bypass graft extending from the left superficial femoral artery (left femoral-tibial bypass graft) in the upper left thigh incompletely imaged. There continues to be some circumferential low attenuation with surrounding rim enhancement and adjacent haziness in the perivascular fat associated with this area, which was a site of left iliofemoral endarterectomy with bovine patch angioplasty on 04/28/2017. 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 Circumaortic left renal vein (normal anatomical variant) incidentally noted. No lymphadenopathy noted in the abdomen or pelvis. Reproductive: Prostate gland and seminal vesicles are unremarkable in appearance. Other: No significant volume of ascites.  No pneumoperitoneum. Musculoskeletal: No aggressive appearing lytic or blastic lesions are noted in the visualized portions of the skeleton. IMPRESSION: 1. Today's study demonstrates worsening inflammatory changes and increasing peripancreatic fluid adjacent to the body and tail of the pancreas, indicative of persistent acute pancreatitis. These fluid collections appear to be partially organizing, likely developing in to pancreatic pseudocysts, although no well defined completely organized collection is identified at this time. 2. Gallbladder is partially contracted but demonstrates some mural thickening and slight haziness in the pericholecystic fat. No calcified gallstones are noted. Findings are concerning for acute acalculous cholecystitis, as suggested on prior  ultrasound examination. 3. Postoperative changes related to prior left iliofemoral endarterectomy and bovine patch angioplasty again noted. This may simply reflect postoperative healing, however, given the subtle inflammatory changes in the fat adjacent to the surgical site, clinical correlation to exclude infection is recommended. 4. Small to moderate bilateral pleural effusions lying dependently, new compared to the prior study. This  is associated with areas of passive atelectasis in the visualize lung bases. 5. Aortic atherosclerosis. 6. Additional incidental findings, as above. Aortic Atherosclerosis (ICD10-I70.0). Electronically Signed   By: Vinnie Langton M.D.   On: 07/30/2017 10:42    ROS:  Pertinent items are noted in HPI.  Blood pressure (!) 144/98, pulse 70, temperature 98.1 F (36.7 C), temperature source Oral, resp. rate (!) 22, height 5' 10"  (1.778 m), weight 149 lb 4 oz (67.7 kg), SpO2 96 %. Physical Exam: Pleasant white male no acute distress Head is normocephalic, atraumatic Eyes without scleral icterus Lungs are clear to auscultation with equal breath sounds bilaterally Heart examination reveals regular rate and rhythm without S3, S4, murmurs Abdomen is soft with mild discomfort in the epigastric and left upper quadrant regions, but no specific right upper quadrant pain is noted.  No rigidity is noted.  CT scan images personally reviewed  Assessment/Plan: Impression: Recurrent pancreatitis, history of chronic alcohol use, history of cholelithiasis.  It is difficult to determine whether or not his pancreatitis is due to his alcoholism or cholelithiasis.  With that being said, he may need his gallbladder out in the future, though at this point he has multiple other issues that need to be addressed, in particular his cardiac status.  I am also worried that he may form a pseudocyst in the future should he have continuing episodes of pancreatitis.  GI consultation may be needed just  to clarify future management of Mr. May.  Aviva Signs 07/31/2017, 11:21 AM

## 2017-07-31 NOTE — Progress Notes (Signed)
ANTICOAGULATION CONSULT NOTE -  Pharmacy Consult for Heparin Indication: atrial fibrillation  No Known Allergies  Patient Measurements: Height:  (177.8 cm) Weight: 149 lb 4 oz (67.7 kg) IBW/kg (Calculated) : 73 HEPARIN DW (KG): 65.9   Vital Signs: Temp: 98.1 F (36.7 C) (05/03 0740) Temp Source: Oral (05/03 0740) BP: 120/72 (05/03 0532) Pulse Rate: 66 (05/03 0740)  Labs: Recent Labs    07/29/17 1541 07/29/17 2100  07/30/17 0452 07/30/17 1433 07/30/17 2231 07/31/17 0422  HGB 14.2  --   --  12.2*  --   --  12.5*  HCT 41.8  --   --  35.5*  --   --  36.3*  PLT 348  --   --  283  --   --  285  APTT  --  37*  --   --   --   --   --   LABPROT  --  13.4  --   --   --   --   --   INR  --  1.03  --   --   --   --   --   HEPARINUNFRC  --   --    < > 0.20* 0.25* 0.49 0.45  CREATININE 0.70  --   --  0.61  --   --  0.53*   < > = values in this interval not displayed.   Estimated Creatinine Clearance: 95.2 mL/min (A) (by C-G formula based on SCr of 0.53 mg/dL (L)). Medical History: Past Medical History:  Diagnosis Date  . Chronic ankle pain   . Chronic knee pain   . GERD (gastroesophageal reflux disease)   . Hepatitis    Hep A in 1980's  . Hypoglycemia   . Migraines   . Peripheral vascular disease ([facility])   . Recurrent sinus infections   . Seizures ([facility])   . Shingles    Medications:  Medications Prior to Admission  Medication Sig Dispense Refill Last Dose  . aspirin EC 81 MG tablet Take 81 mg by mouth daily.   07/29/2017 at Unknown time  . cycloSPORINE (RESTASIS) 0.05 % ophthalmic emulsion Place 1 drop into both eyes 2 (two) times daily.   07/29/2017 at Unknown time  . divalproex (DEPAKOTE ER) 500 MG 24 hr tablet Take 1 tablet (500 mg total) at bedtime by mouth. (Patient taking differently: Take 750 mg by mouth at bedtime. ) 90 tablet 4 07/27/2017 at Unknown time  . eletriptan (RELPAX) 40 MG tablet Take 40 mg by mouth every 2 (two) hours as needed for migraine or  headache. May repeat in 2 hours if headache persists or recurs.    07/28/2017 at Unknown time  . fluticasone (FLONASE) 50 MCG/ACT nasal spray Place 2 sprays into both nostrils daily.   07/29/2017 at Unknown time  . gabapentin (NEURONTIN) 300 MG capsule Take 300 mg by mouth daily.    07/29/2017 at Unknown time  . Multiple Vitamin (MULTIVITAMIN WITH MINERALS) TABS tablet Take 1 tablet by mouth daily.   07/29/2017 at Unknown time  . naproxen sodium (ALEVE) 220 MG tablet Take 220 mg by mouth 2 (two) times daily as needed (takes 1 tablet every morning and again later if needed for pain). Takes 1 tablet every morning    Past Week at Unknown time  . SUMAtriptan (IMITREX STATDOSE SYSTEM) 6 MG/0.5ML SOAJ Inject 6 mg into the skin as needed. (Patient taking differently: Inject 6 mg into the skin as needed. ) 12 Cartridge  11 unknown  . SUMAtriptan (IMITREX) 100 MG tablet Take 1 tablet (100 mg total) by mouth every 2 (two) hours as needed for migraine. May repeat in 2 hours if headache persists or recurs. 12 tablet 11 unknown    See Med Rec, reviewed no anticoagulants PTA.  Assessment: Okay for Protocol, Heparin level is now therapeutic.  Goal of Therapy:  Heparin level 0.3-0.7 units/ml Monitor platelets by anticoagulation protocol: Yes   Plan:   Continue Heparin infusion at 1400 units/hr  Heparin level daily  CBC daily while on Heparin  Elder Cyphers, BS Loura Back, BCPS Clinical Pharmacist Pager [phone] 07/31/2017,7:52 AM

## 2017-07-31 NOTE — Consult Note (Addendum)
Cardiology Consult    Patient ID: Brady Bell; [id number]; 10/28/1955   Admit date: 07/29/2017 Date of Consult: 07/31/2017  Primary Care Provider: Kari Baars, MD Primary Cardiologist: New to [facility] - Dr. Purvis Sheffield   Patient Profile    Brady Bell is a 62 y.o. male with past medical history of PVD (s/p left iliofemoral endarterectomy and angioplasty in 03/2017), tobacco use and alcohol use  who is being seen today for the evaluation of atrial fibrillation at the request of Dr. Margaretmary Dys.   History of Present Illness    Brady Bell was recently admitted from 4/24 - 07/24/2017 for evaluation of nausea, vomiting, and abdominal pain. Was found to have pancreatitis which was thought to possibly be due to gallstones but later felt to be due to his chronic alcohol use. He was progressed to a clear liquid diet and was tolerating this well but unfortunately left AMA on 4/26. On the day of leaving AMA, he actually experienced an episode of atrial flutter but converted back to NSR after receiving Cardizem.   He presented back to the ED on 07/29/2017 with recurrent symptoms. Reported abdominal pain, nausea, and vomiting but denied any associated chest pain, dyspnea, or palpitations. While in the ED he was initially thought to be in SVT but was given Adenosine with no improvement in his arrhythmia. IV was later found to be infiltrated. He was therefore started on IV Cardizem and converted back to NSR overnight.    Initial labs showed WBC 8.9, Hgb 14.2, platelets 348, Na+ 137, K+ 2.1, creatinine 0.70, AST 30, and ALT 14. Mg 1.8. Lipase 151. Repeat Abdominal CT showing worsening inflammatory changes and increasing peripancreatic fluid adjacent to the body and tail of the pancreas, indicative of persistent acute pancreatitis, gallbladder with some mural thickening and slight haziness in the pericholecystic fat, concerning for acute acalculous cholecystitis, small to moderate bilateral pleural effusions, and  aortic atherosclerosis.  He has been started on Zosyn and GI and Surgical consults have been requested.   He denies any known history of CAD. Does have PAD as outlined above and remains on ASA 81mg  daily. The patient smokes 0.5 ppd and consumes at least 3-4 shots of Liquor per days. Says it is "sometimes more than this depending on the type of day".   Past Medical History:  Diagnosis Date  . Chronic ankle pain   . Chronic knee pain   . GERD (gastroesophageal reflux disease)   . Hepatitis    Hep A in 1980's  . Hypoglycemia   . Migraines   . Peripheral vascular disease (HCC)   . Recurrent sinus infections   . Seizures (HCC)   . Shingles     Past Surgical History:  Procedure Laterality Date  . ABDOMINAL AORTOGRAM N/A 04/23/2017   Procedure: ABDOMINAL AORTOGRAM;  Surgeon: Fransisco Hertz, MD;  Location: [facility] INVASIVE CV LAB;  Service: Cardiovascular;  Laterality: N/A;  . ENDARTERECTOMY FEMORAL Left 04/28/2017   Procedure: ENDARTERECTOMY LEFT ILIO-FEMORAL ARTERY;  Surgeon: Fransisco Hertz, MD;  Location: [facility] OR;  Service: Vascular;  Laterality: Left;  . FASCIOTOMY  09/06/2011   Procedure: FASCIOTOMY;  Surgeon: Fransisco Hertz, MD;  Location: [facility] OR;  Service: Vascular;  Laterality: Left;  . FEMORAL-TIBIAL BYPASS GRAFT  09/06/2011   Procedure: BYPASS GRAFT FEMORAL-TIBIAL ARTERY;  Surgeon: Fransisco Hertz, MD;  Location: [facility] OR;  Service: Vascular;  Laterality: Left;  . KNEE CARTILAGE SURGERY Left   . LOWER EXTREMITY ANGIOGRAM N/A 09/05/2011  Procedure: LOWER EXTREMITY ANGIOGRAM;  Surgeon: Sherren Kerns, MD;  Location: [facility] CATH LAB;  Service: Cardiovascular;  Laterality: N/A;  . LOWER EXTREMITY ANGIOGRAM Left 04/15/2012   Procedure: LOWER EXTREMITY ANGIOGRAM;  Surgeon: Fransisco Hertz, MD;  Location: [facility] CATH LAB;  Service: Cardiovascular;  Laterality: Left;  . LOWER EXTREMITY ANGIOGRAPHY Bilateral 04/23/2017   Procedure: Lower Extremity Angiography;  Surgeon: Fransisco Hertz, MD;  Location: [facility] INVASIVE CV LAB;   Service: Cardiovascular;  Laterality: Bilateral;  . PATCH ANGIOPLASTY Left 04/28/2017   Procedure: ILIO-FEMORAL ARTERY PATCH ANGIOPLASTY USING Livia Snellen BIOLOGIC PATCH;  Surgeon: Fransisco Hertz, MD;  Location: [facility] OR;  Service: Vascular;  Laterality: Left;  . PR VEIN BYPASS GRAFT,AORTO-FEM-POP  09/06/11   Left   . TOOTH EXTRACTION  June-July-Aug. 2015   Several      Home Medications:  Prior to Admission medications   Medication Sig Start Date End Date Taking? Authorizing Provider  aspirin EC 81 MG tablet Take 81 mg by mouth daily.   Yes [provider]  cycloSPORINE (RESTASIS) 0.05 % ophthalmic emulsion Place 1 drop into both eyes 2 (two) times daily.   Yes [provider]  divalproex (DEPAKOTE ER) 500 MG 24 hr tablet Take 1 tablet (500 mg total) at bedtime by mouth. Patient taking differently: Take 750 mg by mouth at bedtime.  02/11/17  Yes Levert Feinstein, MD  eletriptan (RELPAX) 40 MG tablet Take 40 mg by mouth every 2 (two) hours as needed for migraine or headache. May repeat in 2 hours if headache persists or recurs.    Yes [provider]  fluticasone (FLONASE) 50 MCG/ACT nasal spray Place 2 sprays into both nostrils daily.   Yes [provider]  gabapentin (NEURONTIN) 300 MG capsule Take 300 mg by mouth daily.    Yes [provider]  Multiple Vitamin (MULTIVITAMIN WITH MINERALS) TABS tablet Take 1 tablet by mouth daily.   Yes [provider]  naproxen sodium (ALEVE) 220 MG tablet Take 220 mg by mouth 2 (two) times daily as needed (takes 1 tablet every morning and again later if needed for pain). Takes 1 tablet every morning    Yes [provider]  SUMAtriptan (IMITREX STATDOSE SYSTEM) 6 MG/0.5ML SOAJ Inject 6 mg into the skin as needed. Patient taking differently: Inject 6 mg into the skin as needed.  12/08/16  Yes Levert Feinstein, MD  SUMAtriptan (IMITREX) 100 MG tablet Take 1 tablet (100 mg total) by mouth every 2 (two) hours as needed for  migraine. May repeat in 2 hours if headache persists or recurs. 12/08/16  Yes Levert Feinstein, MD    Inpatient Medications: Scheduled Meds: . aspirin EC  81 mg Oral Daily  . cycloSPORINE  1 drop Both Eyes BID  . diltiazem  30 mg Oral Q6H  . divalproex  750 mg Oral QHS  . fluticasone  2 spray Each Nare Daily  . folic acid  1 mg Oral Daily  . gabapentin  300 mg Oral Daily  . multivitamin with minerals  1 tablet Oral Daily  . thiamine  100 mg Oral Daily   Or  . thiamine  100 mg Intravenous Daily   Continuous Infusions: . sodium chloride 10 mL/hr at 07/31/17 0000  . sodium chloride 75 mL/hr at 07/31/17 0000  . heparin 1,400 Units/hr (07/31/17 0736)  . piperacillin-tazobactam (ZOSYN)  IV    . potassium chloride     PRN Meds: acetaminophen **OR** acetaminophen, hydrALAZINE, LORazepam **OR** LORazepam, morphine injection,  ondansetron **OR** ondansetron (ZOFRAN) IV  Allergies:   No Known Allergies  Social History:   Social History   Socioeconomic History  . Marital status: Single    Spouse name: Not on file  . Number of children: 0  . Years of education: some college  . Highest education level: Not on file  Occupational History  . Occupation: Disabled  Social Needs  . Financial resource strain: Not on file  . Food insecurity:    Worry: Not on file    Inability: Not on file  . Transportation needs:    Medical: Not on file    Non-medical: Not on file  Tobacco Use  . Smoking status: Current Every Day Smoker    Packs/day: 0.50    Years: 30.00    Pack years: 15.00    Types: Cigarettes  . Smokeless tobacco: Never Used  . Tobacco comment: pt states that he is trying his best to quit  Substance and Sexual Activity  . Alcohol use: Yes    Alcohol/week: 0.0 oz    Comment: Few drinks per day  . Drug use: Yes    Types: Marijuana    Comment: occasionally  . Sexual activity: Yes  Lifestyle  . Physical activity:    Days per week: Not on file    Minutes per session: Not on file    . Stress: Not on file  Relationships  . Social connections:    Talks on phone: Not on file    Gets together: Not on file    Attends religious service: Not on file    Active member of club or organization: Not on file    Attends meetings of clubs or organizations: Not on file    Relationship status: Not on file  . Intimate partner violence:    Fear of current or ex partner: Not on file    Emotionally abused: Not on file    Physically abused: Not on file    Forced sexual activity: Not on file  Other Topics Concern  . Not on file  Social History Narrative   Lives alone.   Left-handed.   2-3 cups caffeine per day.     Family History:    Family History  Problem Relation Age of Onset  . Alcohol abuse Father   . Kidney failure Father   . Other Mother        "age"  . Diabetes Unknown       Review of Systems    General:  No chills, fever, night sweats or weight changes.  Cardiovascular:  No chest pain, dyspnea on exertion, edema, orthopnea, palpitations, paroxysmal nocturnal dyspnea. Dermatological: No rash, lesions/masses Respiratory: No cough, dyspnea Urologic: No hematuria, dysuria Abdominal:   No diarrhea, bright red blood per rectum, melena, or hematemesis. Positive for abdominal pain, nausea, and vomiting.  Neurologic:  No visual changes, wkns, changes in mental status. All other systems reviewed and are otherwise negative except as noted above.  Physical Exam/Data    Vitals:   07/31/17 0400 07/31/17 0500 07/31/17 0532 07/31/17 0740  BP: (!) 128/96  120/72   Pulse: 66   66  Resp: 19   (!) 28  Temp: 98 F (36.7 C)   98.1 F (36.7 C)  TempSrc: Oral   Oral  SpO2:      Weight:  149 lb 4 oz (67.7 kg)    Height:        Intake/Output Summary (Last 24 hours) at 07/31/2017 0843 Last data  filed at 07/31/2017 0141 Gross per 24 hour  Intake 1756.37 ml  Output 1300 ml  Net 456.37 ml   Filed Weights   07/29/17 2348 07/30/17 0500 07/31/17 0500  Weight: 145 lb 4.5 oz  (65.9 kg) 147 lb 0.8 oz (66.7 kg) 149 lb 4 oz (67.7 kg)   Body mass index is 21.42 kg/m.   General: Pleasant, Caucasian male appearing in NAD Psych: Normal affect. Neuro: Alert and oriented X 3. Moves all extremities spontaneously. HEENT: Normal  Neck: Supple without bruits or JVD. Lungs:  Resp regular and unlabored, CTA without wheezing or rales. Heart: RRR no s3, s4, or murmurs. Abdomen: Soft, non-distended, BS + x 4. Tender to palpation throughout.   Extremities: No clubbing, cyanosis or edema. DP/PT/Radials 2+ and equal bilaterally.   EKG:  The EKG was personally reviewed and demonstrates:  Atrial tachycardia, HR 157, most consistent with Atrial Flutter.   Telemetry:  Telemetry was personally reviewed and demonstrates: NSR, HR in 70's to 80's. Brief episode of atrial tach overnight lasting for less than 5 seconds.    Labs/Studies     Relevant CV Studies:  Echocardiogram: Pending  Laboratory Data:  Chemistry Recent Labs  Lab 07/29/17 1541 07/30/17 0452 07/31/17 0422  NA 137 138 137  K 2.1* 3.1* 3.3*  CL 92* 102 104  CO2 GLUCOSE 108* 97 100*  BUN 8 8 <5*  CREATININE 0.70 0.61 0.53*  CALCIUM 8.9 8.0* 8.0*  GFRNONAA >60 >60 >60  GFRAA >60 >60 >60  ANIONGAP 16* 10 9    Recent Labs  Lab 07/29/17 1541 07/30/17 0452  PROT 6.7 5.3*  ALBUMIN 2.9* 2.3*  AST 30 26  ALT 17 16*  ALKPHOS 98 78  BILITOT 0.7 0.8   Hematology Recent Labs  Lab 07/29/17 1541 07/30/17 0452 07/31/17 0422  WBC 8.9 8.4 8.0  RBC 4.32 3.63* 3.73*  HGB 14.2 12.2* 12.5*  HCT 41.8 35.5* 36.3*  MCV 96.8 97.8 97.3  MCH 32.9 33.6 33.5  MCHC 34.0 34.4 34.4  RDW 13.4 13.6 13.5  PLT 348 283 285   Cardiac EnzymesNo results for input(s): TROPONINI in the last 168 hours. No results for input(s): TROPIPOC in the last 168 hours.  BNPNo results for input(s): BNP, PROBNP in the last 168 hours.  DDimer No results for input(s): DDIMER in the last 168 hours.  Radiology/Studies:  Ct  Abdomen Pelvis W Wo Contrast  Result Date: 07/30/2017 CLINICAL DATA:  62 year old male with history of upper abdominal pain and nausea for several days. Elevated lipase. EXAM: CT ABDOMEN AND PELVIS WITHOUT AND WITH CONTRAST TECHNIQUE: Multidetector CT imaging of the abdomen and pelvis was performed following the standard protocol before and following the bolus administration of intravenous contrast. CONTRAST:  ISOVUE-300 IOPAMIDOL (ISOVUE-300) INJECTION 61% COMPARISON:  CT the abdomen and pelvis 07/22/2017. FINDINGS: Lower chest: Small to moderate bilateral pleural effusions lying dependently with some associated passive atelectasis in the lower lobes of the lungs bilaterally. Emphysematous changes throughout the visualized lung bases. Atherosclerotic calcifications in the left anterior descending coronary arteries. Hepatobiliary: Focal fatty infiltration in segment 4A and 4B of the liver adjacent to the falciform ligament. No other suspicious cystic or solid hepatic lesions. No intra or extrahepatic biliary ductal dilatation. Gallbladder is under distended, but the gallbladder wall appears thickened measuring up to 9 mm, and demonstrates slight haziness in the adjacent fat, which could indicate inflammation. No definite calcified gallstones are identified. Pancreas: No definite pancreatic mass. No pancreatic  ductal dilatation. Large amount of peripancreatic fluid and inflammatory changes surrounding the body and tail of the pancreas, indicative of an acute pancreatitis, significantly increased compared to the prior study. Pancreatic parenchyma appears to enhance normally. Spleen: Numerous calcified granulomas scattered throughout the spleen. Adrenals/Urinary Tract: Bilateral kidneys and bilateral adrenal glands are normal in appearance. No hydroureteronephrosis. Urinary bladder is normal in appearance. Bilateral adrenal glands are normal in appearance. Stomach/Bowel: Normal appearance of the stomach. No  pathologic dilatation of small bowel or colon. The appendix is not confidently identified and may be surgically absent. Regardless, there are no inflammatory changes noted adjacent to the cecum to suggest the presence of an acute appendicitis at this time. Vascular/Lymphatic: Aortic atherosclerosis, without evidence of aneurysm or dissection in the abdominal or pelvic vasculature. Bypass graft extending from the left superficial femoral artery (left femoral-tibial bypass graft) in the upper left thigh incompletely imaged. There continues to be some circumferential low attenuation with surrounding rim enhancement and adjacent haziness in the perivascular fat associated with this area, which was a site of left iliofemoral endarterectomy with bovine patch angioplasty on 04/28/2017. 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 Circumaortic left renal vein (normal anatomical variant) incidentally noted. No lymphadenopathy noted in the abdomen or pelvis. Reproductive: Prostate gland and seminal vesicles are unremarkable in appearance. Other: No significant volume of ascites.  No pneumoperitoneum. Musculoskeletal: No aggressive appearing lytic or blastic lesions are noted in the visualized portions of the skeleton. IMPRESSION: 1. Today's study demonstrates worsening inflammatory changes and increasing peripancreatic fluid adjacent to the body and tail of the pancreas, indicative of persistent acute pancreatitis. These fluid collections appear to be partially organizing, likely developing in to pancreatic pseudocysts, although no well defined completely organized collection is identified at this time. 2. Gallbladder is partially contracted but demonstrates some mural thickening and slight haziness in the pericholecystic fat. No calcified gallstones are noted. Findings are concerning for acute acalculous cholecystitis, as suggested on prior ultrasound  examination. 3. Postoperative changes related to prior left iliofemoral endarterectomy and bovine patch angioplasty again noted. This may simply reflect postoperative healing, however, given the subtle inflammatory changes in the fat adjacent to the surgical site, clinical correlation to exclude infection is recommended. 4. Small to moderate bilateral pleural effusions lying dependently, new compared to the prior study. This is associated with areas of passive atelectasis in the visualize lung bases. 5. Aortic atherosclerosis. 6. Additional incidental findings, as above. Aortic Atherosclerosis (ICD10-I70.0). Electronically Signed   By: Trudie Reed M.D.   On: 07/30/2017 10:42     Assessment & Plan    1. Paroxysmal Atrial Flutter - the patient has experienced recurrent admissions over the past few weeks which has been due on at least one occasion to him leaving AMA. He experienced an episode of atrial flutter on 4/26 and converted back to NSR with IV Cardizem. He developed recurrent atrial flutter while in the ED at the time of his current admission and again converted back to NSR with administration of IV Cardizem. Has has maintained NSR on telemetry since.  - initial labs showed that he was significantly hypokalemic with K+ at 2.1 but this has since been replaced.  - it is likely that his episodes of atrial flutter have been triggered by his acute illness and electrolyte abnormalities. He has been started on PO Cardizem  QID. Will consolidate this to Cardizem CD  daily as BP has remained stable. Will obtain an  echocardiogram to assess for structural abnormalities.  - This patients CHA2DS2-VASc Score and unadjusted Ischemic Stroke Rate (% per year) is equal to 0.6 % stroke rate/year from a score of 1 (Vascular). He is not an ideal anticoagulation candidate in the setting of his chronic alcohol use. Would continue with ASA  daily in the setting of his low CHA2DS2-VASc score and known  PAD.  2. PAD - s/p left iliofemoral endarterectomy and angioplasty in 03/2017.  - followed by Dr. Imogene Burn as an outpatient. Remains on ASA  daily.   3. Pancreatitis - Lipase elevated to 151 on admission, improved to 62 this AM. Abdominal CT shows worsening inflammatory changes and increasing peripancreatic fluid adjacent to the body and tail of the pancreas, indicative of persistent acute pancreatitis. - GI and Surgical consults have been requested.   4. Alcohol Abuse/Tobacco Use  - smokes 0.5 ppd and consumes at least 3-4 shots of Liquor per days. Says it is "sometimes more than this depending on the type of day". Cessation advised.   5. Hypokalemia - K+ 2.1 on admission, improved to 3.3 today.  - potassium replacement has been ordered.    For questions or updates, please contact [facility] Please consult [email or web address] for contact info under Cardiology/STEMI.  Signed, Ellsworth Lennox, PA-C 07/31/2017, 8:43 AM Pager: [phone]  The patient was seen and examined, and I agree with the history, physical exam, assessment and plan as documented above, with modifications as noted below. I have also personally reviewed all relevant documentation, old records, labs, and both radiographic and cardiovascular studies. I have also independently interpreted old and new ECG's.  Briefly, this is a 62 yr old male with alcoholic pancreatitis diagnosed in late April 2019 and found to be in atrial flutter. He signed out AMA but converted to sinus rhythm with diltiazem.  He presented again with recurrent symptoms and found to be in rapid atrial flutter and again converted to sinus rhythm.  K on admission markedly low at 2.1.  I reviewed the CT abd/pelvis above demonstrative of acute pancreatitis and acute acalculous cholecystitis along with moderate bilateral pleural effusions.  He denies chest pain and palpitations but does have some shortness of breath. Denies orthopnea/PND.  He has a  h/o PVD as described above.  Atrial flutter likely triggered by acute illness and electrolyte abnormalities.  Does not require systemic anticoagulation. Continue ASA and stop IV heparin. Will switch short-acting diltiazem to long-acting and obtain an echocardiogram to evaluate cardiac structure and function.   Prentice Docker, MD, [facility]  07/31/2017 10:25 AM

## 2017-08-01 ENCOUNTER — Other Ambulatory Visit: Payer: Self-pay

## 2017-08-01 DIAGNOSIS — K852 Alcohol induced acute pancreatitis without necrosis or infection: Principal | ICD-10-CM

## 2017-08-01 DIAGNOSIS — K8000 Calculus of gallbladder with acute cholecystitis without obstruction: Secondary | ICD-10-CM

## 2017-08-01 LAB — COMPREHENSIVE METABOLIC PANEL
ALT: 15 U/L — AB (ref 17–63)
AST: 23 U/L (ref 15–41)
Albumin: 2.4 g/dL — ABNORMAL LOW (ref 3.5–5.0)
Alkaline Phosphatase: 82 U/L (ref 38–126)
Anion gap: 9 (ref 5–15)
CHLORIDE: 101 mmol/L (ref 101–111)
CO2: 26 mmol/L (ref 22–32)
Calcium: 8.5 mg/dL — ABNORMAL LOW (ref 8.9–10.3)
Creatinine, Ser: 0.62 mg/dL (ref 0.61–1.24)
GFR calc Af Amer: >60 mL/min (ref >60)
GFR calc non Af Amer: >60 mL/min (ref >60)
Glucose, Bld: 94 mg/dL (ref 65–99)
POTASSIUM: 3.6 mmol/L (ref 3.5–5.1)
Sodium: 136 mmol/L (ref 135–145)
Total Bilirubin: 0.6 mg/dL (ref 0.3–1.2)
Total Protein: 5.7 g/dL — ABNORMAL LOW (ref 6.5–8.1)

## 2017-08-01 LAB — CBC
HCT: 38.9 % — ABNORMAL LOW (ref 39.0–52.0)
HEMOGLOBIN: 13 g/dL (ref 13.0–17.0)
MCH: 32.7 pg (ref 26.0–34.0)
MCHC: 33.4 g/dL (ref 30.0–36.0)
MCV: 97.7 fL (ref 78.0–100.0)
Platelets: 300 10*3/uL (ref 150–400)
RBC: 3.98 MIL/uL — AB (ref 4.22–5.81)
RDW: 13.3 % (ref 11.5–15.5)
WBC: 7.8 10*3/uL (ref 4.0–10.5)

## 2017-08-01 LAB — TSH: TSH: 3.311 u[IU]/mL (ref 0.350–4.500)

## 2017-08-01 LAB — LIPASE, BLOOD: LIPASE: 46 U/L (ref 11–51)

## 2017-08-01 MED ORDER — BOOST / RESOURCE BREEZE PO LIQD CUSTOM
1.0000 | Freq: Three times a day (TID) | ORAL | Status: DC
  Administered 2017-08-01 – 2017-08-03 (×5): 1 via ORAL

## 2017-08-01 MED ORDER — PANTOPRAZOLE SODIUM 40 MG PO TBEC
40.0000 mg | DELAYED_RELEASE_TABLET | Freq: Every day | ORAL | Status: DC
  Administered 2017-08-01 – 2017-08-03 (×3): 40 mg via ORAL
  Filled 2017-08-01 (×4): qty 1

## 2017-08-01 MED ORDER — DIVALPROEX SODIUM ER 250 MG PO TB24
ORAL_TABLET | ORAL | Status: AC
  Filled 2017-08-01: qty 3

## 2017-08-01 NOTE — Progress Notes (Signed)
Subjective: He says he feels a little better.  He has tolerated clear liquids.  His lipase level has come down.  He had atrial flutter again when he came to the emergency department and converted after diltiazem.  He was also markedly hypokalemic.  He had hospitalization on 424 for acute pancreatitis and checked out AMA.  After he went home he was drinking alcohol again and started to have more abdominal pain and he was not able to eat.  He did not have vomiting at that time.  His lipase was 151 after he came to the emergency department.  As mentioned he went back into atrial flutter and converted.  His abdominal CT shows increased pancreatitis from last admission with potential for pseudocyst formation.  Objective: Vital signs in last 24 hours: Temp:  [97.3 F (36.3 C)-99 F (37.2 C)] 97.3 F (36.3 C) (05/04 0800) Pulse Rate:  [62-81] 62 (05/04 0600) Resp:  [15-29] 15 (05/04 0600) BP: (114-157)/(76-132) 132/93 (05/04 0600) SpO2:  [95 %-100 %] 98 % (05/04 0600) Weight:  [64.7 kg (142 lb 10.2 oz)] 64.7 kg (142 lb 10.2 oz) (05/04 0400) Weight change: -3 kg (-6 lb 9.8 oz) Last BM Date: 07/31/17  Intake/Output from previous day: 05/03 0701 - 05/04 0700 In: 2290 [P.O.:240; I.V.:1950; IV Piggyback:100] Out: 8850 [Urine:8850]  PHYSICAL EXAM General appearance: alert and cooperative Resp: rhonchi bilaterally Cardio: regular rate and rhythm, S1, S2 normal, no murmur, click, rub or gallop GI: He still has pretty significant epigastric tenderness bowel sounds are present Extremities: extremities normal, atraumatic, no cyanosis or edema  Lab Results:  Results for orders placed or performed during the hospital encounter of 07/29/17 (from the past 48 hour(s))  Heparin level (unfractionated)     Status: Abnormal   Collection Time: 07/30/17  2:33 PM  Result Value Ref Range   Heparin Unfractionated 0.25 (L) 0.30 - 0.70 IU/mL    Comment:        IF HEPARIN RESULTS ARE BELOW EXPECTED VALUES, AND  PATIENT DOSAGE HAS BEEN CONFIRMED, SUGGEST FOLLOW UP TESTING OF ANTITHROMBIN III LEVELS. Performed at [facility], [address] [place], [place] [address]   Heparin level (unfractionated)     Status: None   Collection Time: 07/30/17 10:31 PM  Result Value Ref Range   Heparin Unfractionated 0.49 0.30 - 0.70 IU/mL    Comment:        IF HEPARIN RESULTS ARE BELOW EXPECTED VALUES, AND PATIENT DOSAGE HAS BEEN CONFIRMED, SUGGEST FOLLOW UP TESTING OF ANTITHROMBIN III LEVELS. Performed at [facility], [address], [place], [place] [address]   Heparin level (unfractionated)     Status: None   Collection Time: 07/31/17  4:22 AM  Result Value Ref Range   Heparin Unfractionated 0.45 0.30 - 0.70 IU/mL    Comment:        IF HEPARIN RESULTS ARE BELOW EXPECTED VALUES, AND PATIENT DOSAGE HAS BEEN CONFIRMED, SUGGEST FOLLOW UP TESTING OF ANTITHROMBIN III LEVELS. Performed at [facility], [address], [place], [place] [address]   CBC     Status: Abnormal   Collection Time: 07/31/17  4:22 AM  Result Value Ref Range   WBC 8.0 4.0 - 10.5 K/uL   RBC 3.73 (L) 4.22 - 5.81 MIL/uL   Hemoglobin 12.5 (L) 13.0 - 17.0 g/dL   HCT 36.3 (L) 39.0 - 52.0 %   MCV 97.3 78.0 - 100.0 fL   MCH 33.5 26.0 - 34.0 pg   MCHC 34.4 30.0 - 36.0 g/dL  RDW 13.5 11.5 - 15.5 %   Platelets 285 150 - 400 K/uL    Comment: Performed at [facility], [address], [place], [place] [address]  Basic metabolic panel     Status: Abnormal   Collection Time: 07/31/17  4:22 AM  Result Value Ref Range   Sodium 137 135 - 145 mmol/L   Potassium 3.3 (L) 3.5 - 5.1 mmol/L   Chloride 104 101 - 111 mmol/L   CO2 24 22 - 32 mmol/L   Glucose, Bld 100 (H) 65 - 99 mg/dL   BUN <5 (L) 6 - 20 mg/dL   Creatinine, Ser 0.53 (L) 0.61 - 1.24 mg/dL   Calcium 8.0 (L) 8.9 - 10.3 mg/dL   GFR calc non Af Amer >60 >60 mL/min   GFR calc Af Amer >60 >60 mL/min    Comment: (NOTE) The eGFR has been calculated using the CKD EPI equation. This  calculation has not been validated in all clinical situations. eGFR's persistently <60 mL/min signify possible Chronic Kidney Disease.    Anion gap 9 5 - 15    Comment: Performed at [facility], [address] [place], [place] [address]  Lipase, blood     Status: Abnormal   Collection Time: 07/31/17  4:22 AM  Result Value Ref Range   Lipase 62 (H) 11 - 51 U/L    Comment: Performed at [facility], [address], [place], [place] [address]  CBC     Status: Abnormal   Collection Time: 08/01/17  5:38 AM  Result Value Ref Range   WBC 7.8 4.0 - 10.5 K/uL   RBC 3.98 (L) 4.22 - 5.81 MIL/uL   Hemoglobin 13.0 13.0 - 17.0 g/dL   HCT 38.9 (L) 39.0 - 52.0 %   MCV 97.7 78.0 - 100.0 fL   MCH 32.7 26.0 - 34.0 pg   MCHC 33.4 30.0 - 36.0 g/dL   RDW 13.3 11.5 - 15.5 %   Platelets 300 150 - 400 K/uL    Comment: Performed at [facility], [address] [place], [place] [address]  Comprehensive metabolic panel     Status: Abnormal   Collection Time: 08/01/17  5:38 AM  Result Value Ref Range   Sodium 136 135 - 145 mmol/L   Potassium 3.6 3.5 - 5.1 mmol/L   Chloride 101 101 - 111 mmol/L   CO2 26 22 - 32 mmol/L   Glucose, Bld 94 65 - 99 mg/dL   BUN <5 (L) 6 - 20 mg/dL   Creatinine, Ser 0.62 0.61 - 1.24 mg/dL   Calcium 8.5 (L) 8.9 - 10.3 mg/dL   Total Protein 5.7 (L) 6.5 - 8.1 g/dL   Albumin 2.4 (L) 3.5 - 5.0 g/dL   AST 23 15 - 41 U/L   ALT 15 (L) 17 - 63 U/L   Alkaline Phosphatase 82 38 - 126 U/L   Total Bilirubin 0.6 0.3 - 1.2 mg/dL   GFR calc non Af Amer >60 >60 mL/min   GFR calc Af Amer >60 >60 mL/min    Comment: (NOTE) The eGFR has been calculated using the CKD EPI equation. This calculation has not been validated in all clinical situations. eGFR's persistently <60 mL/min signify possible Chronic Kidney Disease.    Anion gap 9 5 - 15    Comment: Performed at [facility], [address], [place], [place] [address]  Lipase, blood     Status: None   Collection Time: 08/01/17  5:38 AM   Result Value  Ref Range   Lipase 46 11 - 51 U/L    Comment: Performed at [facility], [address] [place], [place] [address]    ABGS No results for input(s): PHART, PO2ART, TCO2, HCO3 in the last 72 hours.  Invalid input(s): PCO2 CULTURES Recent Results (from the past 240 hour(s))  MRSA PCR Screening     Status: None   Collection Time: 07/24/17 10:20 PM  Result Value Ref Range Status   MRSA by PCR NEGATIVE NEGATIVE Final    Comment:        The GeneXpert MRSA Assay (FDA approved for NASAL specimens only), is one component of a comprehensive MRSA colonization surveillance program. It is not intended to diagnose MRSA infection nor to guide or monitor treatment for MRSA infections. Performed at [facility], [address] [place], [place] [address]    Studies/Results: Ct Abdomen Pelvis W Wo Contrast  Result Date: 07/30/2017 CLINICAL DATA:  62 year old male with history of upper abdominal pain and nausea for several days. Elevated lipase. EXAM: CT ABDOMEN AND PELVIS WITHOUT AND WITH CONTRAST TECHNIQUE: Multidetector CT imaging of the abdomen and pelvis was performed following the standard protocol before and following the bolus administration of intravenous contrast. CONTRAST:  151m ISOVUE-300 IOPAMIDOL (ISOVUE-300) INJECTION 61% COMPARISON:  CT the abdomen and pelvis 07/22/2017. FINDINGS: Lower chest: Small to moderate bilateral pleural effusions lying dependently with some associated passive atelectasis in the lower lobes of the lungs bilaterally. Emphysematous changes throughout the visualized lung bases. Atherosclerotic calcifications in the left anterior descending coronary arteries. Hepatobiliary: Focal fatty infiltration in segment 4A and 4B of the liver adjacent to the falciform ligament. No other suspicious cystic or solid hepatic lesions. No intra or extrahepatic biliary ductal dilatation. Gallbladder is under distended, but the gallbladder wall appears thickened measuring  up to 9 mm, and demonstrates slight haziness in the adjacent fat, which could indicate inflammation. No definite calcified gallstones are identified. Pancreas: No definite pancreatic mass. No pancreatic ductal dilatation. Large amount of peripancreatic fluid and inflammatory changes surrounding the body and tail of the pancreas, indicative of an acute pancreatitis, significantly increased compared to the prior study. Pancreatic parenchyma appears to enhance normally. Spleen: Numerous calcified granulomas scattered throughout the spleen. Adrenals/Urinary Tract: Bilateral kidneys and bilateral adrenal glands are normal in appearance. No hydroureteronephrosis. Urinary bladder is normal in appearance. Bilateral adrenal glands are normal in appearance. Stomach/Bowel: Normal appearance of the stomach. No pathologic dilatation of small bowel or colon. The appendix is not confidently identified and may be surgically absent. Regardless, there are no inflammatory changes noted adjacent to the cecum to suggest the presence of an acute appendicitis at this time. Vascular/Lymphatic: Aortic atherosclerosis, without evidence of aneurysm or dissection in the abdominal or pelvic vasculature. Bypass graft extending from the left superficial femoral artery (left femoral-tibial bypass graft) in the upper left thigh incompletely imaged. There continues to be some circumferential low attenuation with surrounding rim enhancement and adjacent haziness in the perivascular fat associated with this area, which was a site of left iliofemoral endarterectomy with bovine patch angioplasty on 04/28/2017. 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 Circumaortic left renal vein (normal anatomical variant) incidentally noted. No lymphadenopathy noted in the abdomen or pelvis. Reproductive: Prostate gland and seminal vesicles are unremarkable in appearance. Other: No significant  volume of ascites.  No pneumoperitoneum. Musculoskeletal: No aggressive appearing lytic or blastic lesions are noted in the visualized portions of the skeleton. IMPRESSION: 1.  Today's study demonstrates worsening inflammatory changes and increasing peripancreatic fluid adjacent to the body and tail of the pancreas, indicative of persistent acute pancreatitis. These fluid collections appear to be partially organizing, likely developing in to pancreatic pseudocysts, although no well defined completely organized collection is identified at this time. 2. Gallbladder is partially contracted but demonstrates some mural thickening and slight haziness in the pericholecystic fat. No calcified gallstones are noted. Findings are concerning for acute acalculous cholecystitis, as suggested on prior ultrasound examination. 3. Postoperative changes related to prior left iliofemoral endarterectomy and bovine patch angioplasty again noted. This may simply reflect postoperative healing, however, given the subtle inflammatory changes in the fat adjacent to the surgical site, clinical correlation to exclude infection is recommended. 4. Small to moderate bilateral pleural effusions lying dependently, new compared to the prior study. This is associated with areas of passive atelectasis in the visualize lung bases. 5. Aortic atherosclerosis. 6. Additional incidental findings, as above. Aortic Atherosclerosis (ICD10-I70.0). Electronically Signed   By: Vinnie Langton M.D.   On: 07/30/2017 10:42    Medications:  Prior to Admission:  Medications Prior to Admission  Medication Sig Dispense Refill Last Dose  . aspirin EC 81 MG tablet Take 81 mg by mouth daily.   07/29/2017 at Unknown time  . cycloSPORINE (RESTASIS) 0.05 % ophthalmic emulsion Place 1 drop into both eyes 2 (two) times daily.   07/29/2017 at Unknown time  . divalproex (DEPAKOTE ER) 500 MG 24 hr tablet Take 1 tablet (500 mg total) at bedtime by mouth. (Patient taking  differently: Take 750 mg by mouth at bedtime. ) 90 tablet 4 07/27/2017 at Unknown time  . eletriptan (RELPAX) 40 MG tablet Take 40 mg by mouth every 2 (two) hours as needed for migraine or headache. May repeat in 2 hours if headache persists or recurs.    07/28/2017 at Unknown time  . fluticasone (FLONASE) 50 MCG/ACT nasal spray Place 2 sprays into both nostrils daily.   07/29/2017 at Unknown time  . gabapentin (NEURONTIN) 300 MG capsule Take 300 mg by mouth daily.    07/29/2017 at Unknown time  . Multiple Vitamin (MULTIVITAMIN WITH MINERALS) TABS tablet Take 1 tablet by mouth daily.   07/29/2017 at Unknown time  . naproxen sodium (ALEVE) 220 MG tablet Take 220 mg by mouth 2 (two) times daily as needed (takes 1 tablet every morning and again later if needed for pain). Takes 1 tablet every morning    Past Week at Unknown time  . SUMAtriptan (IMITREX STATDOSE SYSTEM) 6 MG/0.5ML SOAJ Inject 6 mg into the skin as needed. (Patient taking differently: Inject 6 mg into the skin as needed. ) 12 Cartridge 11 unknown  . SUMAtriptan (IMITREX) 100 MG tablet Take 1 tablet (100 mg total) by mouth every 2 (two) hours as needed for migraine. May repeat in 2 hours if headache persists or recurs. 12 tablet 11 unknown   Scheduled: . aspirin EC  81 mg Oral Daily  . cycloSPORINE  1 drop Both Eyes BID  . diltiazem  120 mg Oral Daily  . divalproex  750 mg Oral QHS  . fluticasone  2 spray Each Nare Daily  . folic acid  1 mg Oral Daily  . gabapentin  300 mg Oral Daily  . multivitamin with minerals  1 tablet Oral Daily  . nicotine  21 mg Transdermal Daily  . thiamine  100 mg Oral Daily   Or  . thiamine  100 mg Intravenous Daily   Continuous: .  sodium chloride 10 mL/hr at 07/31/17 0000  . sodium chloride 75 mL/hr at 07/31/17 1921  . piperacillin-tazobactam (ZOSYN)  IV 3.375 g (08/01/17 0911)   LDJ:[id number] **OR** acetaminophen, hydrALAZINE, LORazepam **OR** LORazepam, morphine injection, ondansetron **OR** ondansetron  (ZOFRAN) IV  Assesment: He was admitted with ongoing pancreatitis.  His CT has looked worse.  His lipase level is coming down.  He has tolerated clear liquid diet.  He had atrial flutter but is in sinus rhythm.  He had cardiology consultation and is not felt that he needs any further evaluation.  He had echocardiogram which was normal  He has gallstones and questionable cholecystitis and is on antibiotics for that.  It does not appear that his pancreatitis is related to gallstones  He has alcoholism and continues to drink alcohol.  He is on protocol for alcohol withdrawal  He has COPD based on imaging studies and his long-term cigarette use  He has seizure disorder but no seizures recently  He has peripheral arterial disease and has had surgery on his left leg Active Problems:   Atrial flutter (HCC)   Calculus of gallbladder with cholecystitis without biliary obstruction    Plan: Transfer to floor.  Will await GI input on whether we can advance his diet or not    LOS: 3 days   HAWKINS,EDWARD L 08/01/2017, 9:28 AM

## 2017-08-01 NOTE — Progress Notes (Signed)
Patient ID: Brady Bell, male   DOB: 05-Apr-1955, 62 y.o.   MRN: [id number]    Assessment/Plan: ADMITTED WITH PANCREATITIS MOST LIKELY DUE TO ETOH. ALSO HAS GALLSTONES. CLINICALLY IMPROVED.  PLAN: 1. ADVANCE DIET TO FULL LIQUIDS. 2. ADD PROTONIX DAILY. 3. CONTINUE TO MONITOR SYMPTOMS. DISCUSSED WITH PT THE NATURE OF PANCREATITIS, RISKS, AND MANAGEMENT.   Subjective: Since last evaluated the patient  DENIES CHEST PAIN, SOB, NAUSEA OR VOMITING. HAD TWO BMs TODAY. LEFT AMA BECAUSE HE HAD FINANCIAL ISSUES HE HAD TO ADDRESS. FEELS HUNGRY. WOULD LIKE TO ADVANCE DIET.  Objective: Vital signs in last 24 hours: Vitals:   08/01/17 0500 08/01/17 0600  BP:  (!) 132/93  Pulse: 65 62  Resp: 19 15  Temp:    SpO2:  98%     General appearance: alert, cooperative and no distress Resp: clear to auscultation bilaterally Cardio: regular rate and rhythm GI: soft, non-tender; bowel sounds normal; no masses,  no organomegaly  Lab Results: LIPASE 46 Cr 0.62 Hb 13 PLT CT 300    Studies/Results: No results found.  Medications: I have reviewed the patient's current medications.

## 2017-08-01 NOTE — Progress Notes (Signed)
GI note reviewed.  Agree that cholecystectomy is not warranted at this time.  Will sign off.  Please call me if I can be of further assistance.

## 2017-08-02 DIAGNOSIS — K8010 Calculus of gallbladder with chronic cholecystitis without obstruction: Secondary | ICD-10-CM

## 2017-08-02 LAB — BASIC METABOLIC PANEL
ANION GAP: 7 (ref 5–15)
BUN: <5 mg/dL — ABNORMAL LOW (ref 6–20)
CALCIUM: 8.6 mg/dL — AB (ref 8.9–10.3)
CO2: 29 mmol/L (ref 22–32)
Chloride: 95 mmol/L — ABNORMAL LOW (ref 101–111)
Creatinine, Ser: 0.78 mg/dL (ref 0.61–1.24)
Glucose, Bld: 82 mg/dL (ref 65–99)
Potassium: 4.1 mmol/L (ref 3.5–5.1)
Sodium: 131 mmol/L — ABNORMAL LOW (ref 135–145)

## 2017-08-02 LAB — MAGNESIUM: Magnesium: 1.8 mg/dL (ref 1.7–2.4)

## 2017-08-02 LAB — CBC
HCT: 39.8 % (ref 39.0–52.0)
Hemoglobin: 13.4 g/dL (ref 13.0–17.0)
MCH: 32.8 pg (ref 26.0–34.0)
MCHC: 33.7 g/dL (ref 30.0–36.0)
MCV: 97.3 fL (ref 78.0–100.0)
PLATELETS: 280 10*3/uL (ref 150–400)
RBC: 4.09 MIL/uL — AB (ref 4.22–5.81)
RDW: 13.2 % (ref 11.5–15.5)
WBC: 9 10*3/uL (ref 4.0–10.5)

## 2017-08-02 MED ORDER — LORAZEPAM 2 MG/ML IJ SOLN
1.0000 mg | INTRAMUSCULAR | Status: DC | PRN
  Administered 2017-08-02: 1 mg via INTRAVENOUS
  Filled 2017-08-02: qty 1

## 2017-08-02 NOTE — Progress Notes (Signed)
Patient ID: Brady Bell, male   DOB: [birth year], 62 y.o.   MRN: [id number]   Assessment/Plan: Admitted with pancreatitis and evidence for mild cholecystitis likey due to chronic inflammation and not acute.  Plan: 1. Advance diet. ANTICIPATE DISCHARGE IN 24 -48 HRS. 2. Supportive care 3. Cholecystectomy as an outpt.   Subjective: Since I last evaluated the patient HE DENIES NAUSEA/VOMTIING. ABDOMINAL PAIN SAME AFTER ADVANCING DIET. NO CHEST PAIN OR SOB.  Objective: Vital signs in last 24 hours: Vitals:   08/01/17 2122 08/02/17 0501  BP: (!) 151/88 133/84  Pulse: 66 60  Resp: 17 19  Temp: 98.2 F (36.8 C) 97.7 F (36.5 C)  SpO2: 98% 95%   General appearance: alert, cooperative and no distress Resp: clear to auscultation bilaterally Cardio: regular rate and rhythm GI: soft, MILD tenderness x 4, NO REBOUND OR GUARDING; bowel sounds normal;  Lab Results:  Hb 13.4 ECHO: NL EF 6-65%   Studies/Results: No results found.  Medications: I have reviewed the patient's current medications.

## 2017-08-02 NOTE — Progress Notes (Signed)
She currently remains in sinus rhythm we will discontinue IV heparin due to risk of hemorrhagic pancreatitis plan 2D echo within normal parameters for chamber dimensions and contractility. Brady Bell UEA:[id number] DOB: [birth year] DOA: 07/29/2017 PCP: Brady Baars, MD   Physical Exam: Blood pressure 133/84, pulse 60, temperature 97.7 F (36.5 C), temperature source Oral, resp. rate 19, height  (1.778 m), weight 64.7 kg (142 lb 10.2 oz), SpO2 95 %.  Lungs show prolonged expiratory phase scattered rhonchi bilaterally no rales no wheeze appreciable heart regular rhythm no S3-S4 no heaves thrills rubs abdomen soft minimal tenderness no guarding no rebound bowel sounds normoactive   Investigations:  Recent Results (from the past 240 hour(s))  MRSA PCR Screening     Status: None   Collection Time: 07/24/17 10:20 PM  Result Value Ref Range Status   MRSA by PCR NEGATIVE NEGATIVE Final    Comment:        The GeneXpert MRSA Assay (FDA approved for NASAL specimens only), is one component of a comprehensive MRSA colonization surveillance program. It is not intended to diagnose MRSA infection nor to guide or monitor treatment for MRSA infections. Performed at [facility], [address], [place], [place] [address]      Basic Metabolic Panel: Recent Labs    07/31/17 0422 08/01/17 0538  NA 137 136  K 3.3* 3.6  CL 104 101  CO2 24 26  GLUCOSE 100* 94  BUN <5* <5*  CREATININE 0.53* 0.62  CALCIUM 8.0* 8.5*   Liver Function Tests: Recent Labs    08/01/17 0538  AST 23  ALT 15*  ALKPHOS 82  BILITOT 0.6  PROT 5.7*  ALBUMIN 2.4*     CBC: Recent Labs    08/01/17 0538 08/02/17 0610  WBC 7.8 9.0  HGB 13.0 13.4  HCT 38.9* 39.8  MCV 97.7 97.3  PLT 300 280    No results found.    Medications:   Impression:  Active Problems:   Atrial flutter (HCC)   Calculus of gallbladder with cholecystitis without biliary obstruction     Plan: Discontinue heparin for  atrial fibrillation.  Obtain bmet and serum magnesium level today due to ethanol.  Discussed a a with patient also.  Consultants: Surgery and gastroenterology   Procedures   Antibiotics:            Time spent: 30 minutes   LOS: 4 days   Brady Bell M   08/02/2017, 12:00 PM

## 2017-08-03 MED ORDER — DILTIAZEM HCL ER COATED BEADS 120 MG PO CP24: 120.0000 mg | ORAL_CAPSULE | Freq: Every day | ORAL | 12 refills | Status: DC

## 2017-08-03 MED ORDER — PANTOPRAZOLE SODIUM 40 MG PO TBEC: 40.0000 mg | DELAYED_RELEASE_TABLET | Freq: Every day | ORAL | 12 refills | Status: AC

## 2017-08-03 MED ORDER — ESCITALOPRAM OXALATE 10 MG PO TABS: 10.0000 mg | ORAL_TABLET | Freq: Every day | ORAL | 2 refills | Status: DC

## 2017-08-03 MED ORDER — NICOTINE 21 MG/24HR TD PT24: 21.0000 mg | MEDICATED_PATCH | Freq: Every day | TRANSDERMAL | 0 refills | Status: DC

## 2017-08-03 MED ORDER — FOLIC ACID 1 MG PO TABS: 1.0000 mg | ORAL_TABLET | Freq: Every day | ORAL | 12 refills | Status: DC

## 2017-08-03 MED ORDER — TRAMADOL HCL 50 MG PO TABS: 50.0000 mg | ORAL_TABLET | Freq: Four times a day (QID) | ORAL | 0 refills | Status: DC | PRN

## 2017-08-03 NOTE — Progress Notes (Signed)
Patient is to be discharged home and in stable condition. Patient's IV and telemetry removed, WNL. Patient given discharge instructions and verbalized understanding. All questions addressed and answered. Patient will be escorted out by staff via wheelchair upon arrival of transportation.   Quita Skye, RN

## 2017-08-03 NOTE — Progress Notes (Signed)
progressing 

## 2017-08-03 NOTE — Progress Notes (Signed)
Subjective: He says he feels much better and wants to go home.  He is able to take in a regular diet  Objective: Vital signs in last 24 hours: Temp:  [98.3 F (36.8 C)-98.6 F (37 C)] 98.6 F (37 C) (05/06 0536) Pulse Rate:  [65-73] 65 (05/06 0536) Resp:  [16-18] 18 (05/06 0536) BP: (124-143)/(84-95) 138/84 (05/06 0536) SpO2:  [97 %-99 %] 99 % (05/06 0536) Weight change:  Last BM Date: 08/01/17  Intake/Output from previous day: 05/05 0701 - 05/06 0700 In: 1815 [P.O.:840; I.V.:750; IV Piggyback:150] Out: -   PHYSICAL EXAM General appearance: alert, cooperative and no distress Resp: clear to auscultation bilaterally Cardio: regular rate and rhythm, S1, S2 normal, no murmur, click, rub or gallop GI: soft, non-tender; bowel sounds normal; no masses,  no organomegaly Extremities: extremities normal, atraumatic, no cyanosis or edema  Lab Results:  Results for orders placed or performed during the hospital encounter of 07/29/17 (from the past 48 hour(s))  CBC     Status: Abnormal   Collection Time: 08/02/17  6:10 AM  Result Value Ref Range   WBC 9.0 4.0 - 10.5 K/uL   RBC 4.09 (L) 4.22 - 5.81 MIL/uL   Hemoglobin 13.4 13.0 - 17.0 g/dL   HCT 39.8 39.0 - 52.0 %   MCV 97.3 78.0 - 100.0 fL   MCH 32.8 26.0 - 34.0 pg   MCHC 33.7 30.0 - 36.0 g/dL   RDW 13.2 11.5 - 15.5 %   Platelets 280 150 - 400 K/uL    Comment: Performed at [facility], [address] [place], [place] [address]  Basic metabolic panel     Status: Abnormal   Collection Time: 08/02/17  6:10 AM  Result Value Ref Range   Sodium 131 (L) 135 - 145 mmol/L   Potassium 4.1 3.5 - 5.1 mmol/L   Chloride 95 (L) 101 - 111 mmol/L   CO2 29 22 - 32 mmol/L   Glucose, Bld 82 65 - 99 mg/dL   BUN <5 (L) 6 - 20 mg/dL   Creatinine, Ser 0.78 0.61 - 1.24 mg/dL   Calcium 8.6 (L) 8.9 - 10.3 mg/dL   GFR calc non Af Amer >60 >60 mL/min   GFR calc Af Amer >60 >60 mL/min    Comment: (NOTE) The eGFR has been calculated using the CKD EPI  equation. This calculation has not been validated in all clinical situations. eGFR's persistently <60 mL/min signify possible Chronic Kidney Disease.    Anion gap 7 5 - 15    Comment: Performed at [facility], [address], [place], [place] [address]  Magnesium     Status: None   Collection Time: 08/02/17  6:10 AM  Result Value Ref Range   Magnesium 1.8 1.7 - 2.4 mg/dL    Comment: Performed at [facility], [address], [place], [place] [address]    ABGS No results for input(s): PHART, PO2ART, TCO2, HCO3 in the last 72 hours.  Invalid input(s): PCO2 CULTURES Recent Results (from the past 240 hour(s))  MRSA PCR Screening     Status: None   Collection Time: 07/24/17 10:20 PM  Result Value Ref Range Status   MRSA by PCR NEGATIVE NEGATIVE Final    Comment:        The GeneXpert MRSA Assay (FDA approved for NASAL specimens only), is one component of a comprehensive MRSA colonization surveillance program. It is not intended to diagnose MRSA infection nor to guide or monitor treatment for MRSA infections. Performed  at [facility] [address], [place], [place] [address]    Studies/Results: No results found.  Medications:  Prior to Admission:  Medications Prior to Admission  Medication Sig Dispense Refill Last Dose  . aspirin EC 81 MG tablet Take 81 mg by mouth daily.   07/29/2017 at Unknown time  . cycloSPORINE (RESTASIS) 0.05 % ophthalmic emulsion Place 1 drop into both eyes 2 (two) times daily.   07/29/2017 at Unknown time  . divalproex (DEPAKOTE ER) 500 MG 24 hr tablet Take 1 tablet (500 mg total) at bedtime by mouth. (Patient taking differently: Take 750 mg by mouth at bedtime. ) 90 tablet 4 07/27/2017 at Unknown time  . eletriptan (RELPAX) 40 MG tablet Take 40 mg by mouth every 2 (two) hours as needed for migraine or headache. May repeat in 2 hours if headache persists or recurs.    07/28/2017 at Unknown time  . fluticasone (FLONASE) 50 MCG/ACT nasal spray Place 2 sprays  into both nostrils daily.   07/29/2017 at Unknown time  . gabapentin (NEURONTIN) 300 MG capsule Take 300 mg by mouth daily.    07/29/2017 at Unknown time  . Multiple Vitamin (MULTIVITAMIN WITH MINERALS) TABS tablet Take 1 tablet by mouth daily.   07/29/2017 at Unknown time  . naproxen sodium (ALEVE) 220 MG tablet Take 220 mg by mouth 2 (two) times daily as needed (takes 1 tablet every morning and again later if needed for pain). Takes 1 tablet every morning    Past Week at Unknown time  . SUMAtriptan (IMITREX STATDOSE SYSTEM) 6 MG/0.5ML SOAJ Inject 6 mg into the skin as needed. (Patient taking differently: Inject 6 mg into the skin as needed. ) 12 Cartridge 11 unknown  . SUMAtriptan (IMITREX) 100 MG tablet Take 1 tablet (100 mg total) by mouth every 2 (two) hours as needed for migraine. May repeat in 2 hours if headache persists or recurs. 12 tablet 11 unknown   Scheduled: . aspirin EC  81 mg Oral Daily  . cycloSPORINE  1 drop Both Eyes BID  . diltiazem  120 mg Oral Daily  . divalproex  750 mg Oral QHS  . feeding supplement  1 Container Oral TID BM  . fluticasone  2 spray Each Nare Daily  . folic acid  1 mg Oral Daily  . gabapentin  300 mg Oral Daily  . multivitamin with minerals  1 tablet Oral Daily  . nicotine  21 mg Transdermal Daily  . pantoprazole  40 mg Oral QAC breakfast  . thiamine  100 mg Oral Daily   Or  . thiamine  100 mg Intravenous Daily   Continuous: . sodium chloride 10 mL/hr at 07/31/17 0000  . piperacillin-tazobactam (ZOSYN)  IV Stopped (08/03/17 1287)   OMV:[id number] **OR** acetaminophen, hydrALAZINE, LORazepam, morphine injection, ondansetron **OR** ondansetron (ZOFRAN) IV  Assesment: He was admitted with pancreatitis which is ongoing from previous admission when he checked out AMA.  He was found to have atrial flutter but he converted to sinus rhythm.  He had marked hypokalemia.  That has resolved.  His pain is better he is able to tolerate regular diet and he has  minimal pain now.  He has chronic alcoholism and his pancreatitis is related to that.  I told him he is going to have to stop drinking.  He is through the.  That we would be concerned about delirium tremens.  He has COPD and ongoing nicotine abuse and I told him he needs to stop that also  He has seizure disorder but no seizures recently  He has anxiety and he will be sent home on ESCitalopram  Active Problems:   Atrial flutter (HCC)   Calculus of gallbladder with cholecystitis without biliary obstruction    Plan: Discharge home today    LOS: 5 days   Lella Mullany L 08/03/2017, 8:44 AM

## 2017-08-03 NOTE — Discharge Summary (Signed)
Physician Discharge Summary  Patient ID: Brady Bell MRN: [id number] DOB/AGE: [birth year] 62 y.o. Primary Care Physician:Ponciano Shealy, Ramon Dredge, MD Admit date: 07/29/2017 Discharge date: 08/03/2017    Discharge Diagnoses:  Pancreatitis Active Problems:   Atrial flutter (HCC)   Calculus of gallbladder with cholecystitis without biliary obstruction Hypokalemia COPD Alcoholism Chronic nicotine abuse Anxiety Peripheral arterial disease Seizure disorder  Allergies as of 08/03/2017   No Known Allergies     Medication List    STOP taking these medications   naproxen sodium 220 MG tablet Commonly known as:  ALEVE     TAKE these medications   aspirin EC 81 MG tablet Take 81 mg by mouth daily.   cycloSPORINE 0.05 % ophthalmic emulsion Commonly known as:  RESTASIS Place 1 drop into both eyes 2 (two) times daily.   diltiazem 120 MG 24 hr capsule Commonly known as:  CARDIZEM CD Take 1 capsule (120 mg total) by mouth daily.   divalproex 500 MG 24 hr tablet Commonly known as:  DEPAKOTE ER Take 1 tablet (500 mg total) at bedtime by mouth. What changed:  how much to take   eletriptan 40 MG tablet Commonly known as:  RELPAX Take 40 mg by mouth every 2 (two) hours as needed for migraine or headache. May repeat in 2 hours if headache persists or recurs.   escitalopram 10 MG tablet Commonly known as:  LEXAPRO Take 1 tablet (10 mg total) by mouth daily.   fluticasone 50 MCG/ACT nasal spray Commonly known as:  FLONASE Place 2 sprays into both nostrils daily.   folic acid 1 MG tablet Commonly known as:  FOLVITE Take 1 tablet (1 mg total) by mouth daily.   gabapentin 300 MG capsule Commonly known as:  NEURONTIN Take 300 mg by mouth daily.   multivitamin with minerals Tabs tablet Take 1 tablet by mouth daily.   nicotine 21 mg/24hr patch Commonly known as:  NICODERM CQ - dosed in mg/24 hours Place 1 patch (21 mg total) onto the skin daily.   pantoprazole 40 MG tablet Commonly  known as:  PROTONIX Take 1 tablet (40 mg total) by mouth daily before breakfast.   SUMAtriptan 6 MG/0.5ML Soaj Commonly known as:  IMITREX STATDOSE SYSTEM Inject 6 mg into the skin as needed.   SUMAtriptan 100 MG tablet Commonly known as:  IMITREX Take 1 tablet (100 mg total) by mouth every 2 (two) hours as needed for migraine. May repeat in 2 hours if headache persists or recurs.   traMADol 50 MG tablet Commonly known as:  ULTRAM Take 1 tablet (50 mg total) by mouth every 6 (six) hours as needed.       Discharged Condition: Improved    Consults: Cardiology/gastroenterology  Significant Diagnostic Studies: Ct Abdomen Pelvis W Wo Contrast  Result Date: 07/30/2017 CLINICAL DATA:  62 year old male with history of upper abdominal pain and nausea for several days. Elevated lipase. EXAM: CT ABDOMEN AND PELVIS WITHOUT AND WITH CONTRAST TECHNIQUE: Multidetector CT imaging of the abdomen and pelvis was performed following the standard protocol before and following the bolus administration of intravenous contrast. CONTRAST:  ISOVUE-300 IOPAMIDOL (ISOVUE-300) INJECTION 61% COMPARISON:  CT the abdomen and pelvis 07/22/2017. FINDINGS: Lower chest: Small to moderate bilateral pleural effusions lying dependently with some associated passive atelectasis in the lower lobes of the lungs bilaterally. Emphysematous changes throughout the visualized lung bases. Atherosclerotic calcifications in the left anterior descending coronary arteries. Hepatobiliary: Focal fatty infiltration in segment 4A and 4B of the liver adjacent  to the falciform ligament. No other suspicious cystic or solid hepatic lesions. No intra or extrahepatic biliary ductal dilatation. Gallbladder is under distended, but the gallbladder wall appears thickened measuring up to 9 mm, and demonstrates slight haziness in the adjacent fat, which could indicate inflammation. No definite calcified gallstones are identified. Pancreas: No definite  pancreatic mass. No pancreatic ductal dilatation. Large amount of peripancreatic fluid and inflammatory changes surrounding the body and tail of the pancreas, indicative of an acute pancreatitis, significantly increased compared to the prior study. Pancreatic parenchyma appears to enhance normally. Spleen: Numerous calcified granulomas scattered throughout the spleen. Adrenals/Urinary Tract: Bilateral kidneys and bilateral adrenal glands are normal in appearance. No hydroureteronephrosis. Urinary bladder is normal in appearance. Bilateral adrenal glands are normal in appearance. Stomach/Bowel: Normal appearance of the stomach. No pathologic dilatation of small bowel or colon. The appendix is not confidently identified and may be surgically absent. Regardless, there are no inflammatory changes noted adjacent to the cecum to suggest the presence of an acute appendicitis at this time. Vascular/Lymphatic: Aortic atherosclerosis, without evidence of aneurysm or dissection in the abdominal or pelvic vasculature. Bypass graft extending from the left superficial femoral artery (left femoral-tibial bypass graft) in the upper left thigh incompletely imaged. There continues to be some circumferential low attenuation with surrounding rim enhancement and adjacent haziness in the perivascular fat associated with this area, which was a site of left iliofemoral endarterectomy with bovine patch angioplasty on 04/28/2017. 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 Circumaortic left renal vein (normal anatomical variant) incidentally noted. No lymphadenopathy noted in the abdomen or pelvis. Reproductive: Prostate gland and seminal vesicles are unremarkable in appearance. Other: No significant volume of ascites.  No pneumoperitoneum. Musculoskeletal: No aggressive appearing lytic or blastic lesions are noted in the visualized portions of the skeleton. IMPRESSION: 1.  Today's study demonstrates worsening inflammatory changes and increasing peripancreatic fluid adjacent to the body and tail of the pancreas, indicative of persistent acute pancreatitis. These fluid collections appear to be partially organizing, likely developing in to pancreatic pseudocysts, although no well defined completely organized collection is identified at this time. 2. Gallbladder is partially contracted but demonstrates some mural thickening and slight haziness in the pericholecystic fat. No calcified gallstones are noted. Findings are concerning for acute acalculous cholecystitis, as suggested on prior ultrasound examination. 3. Postoperative changes related to prior left iliofemoral endarterectomy and bovine patch angioplasty again noted. This may simply reflect postoperative healing, however, given the subtle inflammatory changes in the fat adjacent to the surgical site, clinical correlation to exclude infection is recommended. 4. Small to moderate bilateral pleural effusions lying dependently, new compared to the prior study. This is associated with areas of passive atelectasis in the visualize lung bases. 5. Aortic atherosclerosis. 6. Additional incidental findings, as above. Aortic Atherosclerosis (ICD10-I70.0). Electronically Signed   By: Trudie Reed M.D.   On: 07/30/2017 10:42   Ct Abdomen Pelvis W Contrast  Result Date: 07/22/2017 CLINICAL DATA:  Abdominal pain and bilateral flank pain for 1 day. Nausea and vomiting. EXAM: CT ABDOMEN AND PELVIS WITH CONTRAST TECHNIQUE: Multidetector CT imaging of the abdomen and pelvis was performed using the standard protocol following bolus administration of intravenous contrast. CONTRAST:  ISOVUE-300 IOPAMIDOL (ISOVUE-300) INJECTION 61% COMPARISON:  None. FINDINGS: Lower chest: Mild scarring in the lung bases. Bullous changes in the lung bases. Hepatobiliary: Gallbladder is contracted with edematous and enhancing wall. This is nonspecific but may  represent cholecystitis.  No definite radiopaque stones. No bile duct dilatation. No focal liver lesions. Pancreas: Edema surrounding the pancreas, most prominent along the tail. Fluid tracks along the pericolic gutters bilaterally. Changes are consistent with acute pancreatitis. No loculated fluid collections. No pancreatic ductal dilatation. Homogeneous enhancement of the pancreatic parenchyma without evidence of pancreatic necrosis. Spleen: Calcified granulomas in the spleen. Adrenals/Urinary Tract: Adrenal glands are unremarkable. Kidneys are normal, without renal calculi, focal lesion, or hydronephrosis. Bladder is unremarkable. Stomach/Bowel: Stomach is within normal limits. Appendix appears normal. No evidence of bowel wall thickening, distention, or inflammatory changes. Vascular/Lymphatic: Aortic atherosclerosis. No enlarged abdominal or pelvic lymph nodes. Postoperative changes in the left groin with the proximal portion of a left femoral bypass graft demonstrated. 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 Nonocclusive peripheral thrombus. Reproductive: Prostate is unremarkable. Other: No free air in the abdomen. Abdominal wall musculature appears intact. Musculoskeletal: No acute or significant osseous findings. IMPRESSION: 1. Changes of acute pancreatitis with edema and stranding around the pancreas most prominent along the tail. No evidence of abscess or pancreatic necrosis. 2. Gallbladder wall thickening and edema may represent cholecystitis. No stones or bile duct dilatation identified. 3. Postoperative changes in the left groin consistent with a femoral bypass graft. 4. Aortic atherosclerosis. 5. Bullous emphysematous changes in the lung bases. Electronically Signed   By: Burman Nieves M.D.   On: 07/22/2017 06:26   US Abdomen Limited Ruq  Result Date: 07/22/2017 CLINICAL DATA:  Upper abdominal pain with changes of pancreatitis on recent CT EXAM: ULTRASOUND ABDOMEN LIMITED  RIGHT UPPER QUADRANT COMPARISON:  CT from earlier in the same day. FINDINGS: Gallbladder: Wall thickening is noted to 4 mm. Multiple gallstones are identified. No significant pericholecystic fluid is seen. Positive sonographic Eulah Pont sign is elicited. Common bile duct: Diameter: 2 mm. Liver: Diffusely increased in echogenicity consistent with fatty infiltration. No focal mass lesion is noted. Portal vein is patent on color Doppler imaging with normal direction of blood flow towards the liver. IMPRESSION: Changes consistent with acute calculous cholecystitis. Fatty liver. Electronically Signed   By: Alcide Clever M.D.   On: 07/22/2017 08:52    Lab Results: Basic Metabolic Panel: Recent Labs    08/01/17 0538 08/02/17 0610  NA 136 131*  K 3.6 4.1  CL 101 95*  CO2 26 29  GLUCOSE 94 82  BUN <5* <5*  CREATININE 0.62 0.78  CALCIUM 8.5* 8.6*  MG  --  1.8   Liver Function Tests: Recent Labs    08/01/17 0538  AST 23  ALT 15*  ALKPHOS 82  BILITOT 0.6  PROT 5.7*  ALBUMIN 2.4*     CBC: Recent Labs    08/01/17 0538 08/02/17 0610  WBC 7.8 9.0  HGB 13.0 13.4  HCT 38.9* 39.8  MCV 97.7 97.3  PLT 300 280    Recent Results (from the past 240 hour(s))  MRSA PCR Screening     Status: None   Collection Time: 07/24/17 10:20 PM  Result Value Ref Range Status   MRSA by PCR NEGATIVE NEGATIVE Final    Comment:        The GeneXpert MRSA Assay (FDA approved for NASAL specimens only), is one component of a comprehensive MRSA colonization surveillance program. It is not intended to diagnose MRSA infection nor to guide or monitor treatment for MRSA infections. Performed at [facility], [address] [place], [place] [address]      Hospital Course: This is a 62 year old who came to the  emergency department because of severe abdominal pain nausea.  He was found to have pancreatitis again.  He had been in the hospital with pancreatitis developed atrial flutter and checked out AMA.  He  came back to the emergency department because of abdominal pain and inability to eat.  He had started drinking alcohol again.  He was back in atrial flutter.  He converted in the emergency department.  He was placed on diltiazem.  He was very hypokalemic and his potassium was replaced.  He was started on alcohol withdrawal protocol.  CT showed pancreatitis that was worse than on his previous admission.  He was treated with pain medication IV fluids and slowly improved.  By the time of discharge he could tolerate a regular diet with minimal abdominal pain.  He was counseled on stopping alcohol abuse and I suggested that he go to AA.  Discharge Exam: Blood pressure 138/84, pulse 65, temperature 98.6 F (37 C), temperature source Oral, resp. rate 18, height  (1.778 m), weight 64.7 kg (142 lb 10.2 oz), SpO2 99 %. He is awake and alert.  Comfortable.  Minimal if any tenderness in his abdomen  Disposition:                                       Home      Signed: Mizael Sagar L   08/03/2017, 8:48 AM

## 2017-08-19 ENCOUNTER — Ambulatory Visit (INDEPENDENT_AMBULATORY_CARE_PROVIDER_SITE_OTHER): Payer: Medicaid Other | Admitting: Vascular Surgery

## 2017-08-19 ENCOUNTER — Encounter: Payer: Self-pay | Admitting: Vascular Surgery

## 2017-08-19 ENCOUNTER — Ambulatory Visit (INDEPENDENT_AMBULATORY_CARE_PROVIDER_SITE_OTHER)
Admission: RE | Admit: 2017-08-19 | Discharge: 2017-08-19 | Disposition: A | Discharge status: Home / Self Care | Payer: Medicaid Other | Source: Ambulatory Visit | Attending: Pulmonary Disease | Admitting: Pulmonary Disease

## 2017-08-19 ENCOUNTER — Ambulatory Visit (HOSPITAL_COMMUNITY)
Admission: RE | Admit: 2017-08-19 | Discharge: 2017-08-19 | Disposition: A | Discharge status: Home / Self Care | Payer: Medicaid Other | Source: Ambulatory Visit | Attending: Pulmonary Disease | Admitting: Pulmonary Disease

## 2017-08-19 VITALS — BP 129/80 | HR 52 | Resp 20 | Ht 70.0 in | Wt 136.9 lb

## 2017-08-19 DIAGNOSIS — I70212 Atherosclerosis of native arteries of extremities with intermittent claudication, left leg: Secondary | ICD-10-CM

## 2017-08-19 DIAGNOSIS — I739 Peripheral vascular disease, unspecified: Secondary | ICD-10-CM

## 2017-08-19 DIAGNOSIS — Z87891 Personal history of nicotine dependence: Secondary | ICD-10-CM | POA: Diagnosis not present

## 2017-08-19 DIAGNOSIS — I10 Essential (primary) hypertension: Secondary | ICD-10-CM | POA: Insufficient documentation

## 2017-08-19 DIAGNOSIS — Z95828 Presence of other vascular implants and grafts: Secondary | ICD-10-CM | POA: Insufficient documentation

## 2017-08-19 NOTE — Progress Notes (Signed)
Established Previous Bypass   History of Present Illness   Brady Bell is a 62 y.o. (12-04-1955) male who presents with chief complaint: abdominal pain.    Prior procedures include: 1. L iliofem EA w/ BPA (04/28/17) 2. L CFA to AT bypass with NR ips GSV (09/06/11)  Pt was diagnosed with gallstone pancreatitis.  He is being scheduled for lap cholecystectomy.  The patient had healed his left groin incision.  He has some residual firmness there.  he patient's treatment regimen currently included: maximal medical management and pain rx for his pancreatitis.  The pt continues to drink one drink of alcohol daily and smoke a few cigarettes a day.  The patient's PMH, PSH, SH, and FamHx were reviewed on 08/19/17 are unchanged from 06/03/17.  Current Outpatient Medications  Medication Sig Dispense Refill  . aspirin EC 81 MG tablet Take 81 mg by mouth daily.    . cycloSPORINE (RESTASIS) 0.05 % ophthalmic emulsion Place 1 drop into both eyes 2 (two) times daily.    Marland Kitchen diltiazem (CARDIZEM CD) 120 MG 24 hr capsule Take 1 capsule (120 mg total) by mouth daily. 30 capsule 12  . divalproex (DEPAKOTE ER) 500 MG 24 hr tablet Take 1 tablet (500 mg total) at bedtime by mouth. (Patient taking differently: Take 750 mg by mouth at bedtime. ) 90 tablet 4  . eletriptan (RELPAX) 40 MG tablet Take 40 mg by mouth every 2 (two) hours as needed for migraine or headache. May repeat in 2 hours if headache persists or recurs.     Marland Kitchen escitalopram (LEXAPRO) 10 MG tablet Take 1 tablet (10 mg total) by mouth daily. 30 tablet 2  . fluticasone (FLONASE) 50 MCG/ACT nasal spray Place 2 sprays into both nostrils daily.    . folic acid (FOLVITE) 1 MG tablet Take 1 tablet (1 mg total) by mouth daily. 30 tablet 12  . gabapentin (NEURONTIN) 300 MG capsule Take 300 mg by mouth daily.     . Multiple Vitamin (MULTIVITAMIN WITH MINERALS) TABS tablet Take 1 tablet by mouth daily.    . nicotine (NICODERM CQ - DOSED IN MG/24 HOURS) 21  mg/24hr patch Place 1 patch (21 mg total) onto the skin daily. 28 patch 0  . pantoprazole (PROTONIX) 40 MG tablet Take 1 tablet (40 mg total) by mouth daily before breakfast. 30 tablet 12  . SUMAtriptan (IMITREX STATDOSE SYSTEM) 6 MG/0.5ML SOAJ Inject 6 mg into the skin as needed. (Patient taking differently: Inject 6 mg into the skin as needed. ) 12 Cartridge 11  . SUMAtriptan (IMITREX) 100 MG tablet Take 1 tablet (100 mg total) by mouth every 2 (two) hours as needed for migraine. May repeat in 2 hours if headache persists or recurs. 12 tablet 11  . traMADol (ULTRAM) 50 MG tablet Take 1 tablet (50 mg total) by mouth every 6 (six) hours as needed. 20 tablet 0   No current facility-administered medications for this visit.     On ROS today: no rest pain, no intermittent claudication    Physical Examination   Vitals:   08/19/17 1217  BP: 129/80  Pulse: (!) 52  Resp: 20  SpO2: 100%  Weight: 136 lb 14.4 oz (62.1 kg)  Height:  (1.778 m)   Body mass index is 19.64 kg/m.  General Alert, O x 3, WD, NAD  Pulmonary Sym exp, good B air movt, CTA B  Cardiac RRR, Nl S1, S2, no Murmurs, No rubs, No S3,S4  Vascular Vessel  Right Left  Radial Palpable Palpable  Brachial Palpable Palpable  Carotid Palpable, No Bruit Palpable, No Bruit  Aorta Not palpable N/A  Femoral Palpable Palpable  Popliteal Not palpable Not palpable  PT Faintly palpable Not palpable  DP Faintly palpable Palpable    Gastro- intestinal soft, non-distended, non-tender to palpation, No guarding or rebound, no HSM, no masses, no CVAT B, No palpable prominent aortic pulse,    Musculo- skeletal M/S 5/5 throughout  , Extremities without ischemic changes  , No edema present, No visible varicosities , No Lipodermatosclerosis present, L groin with palpable subcutaneous scarring, easily palpable graft pulse  Neurologic Pain and light touch intact in extremities , Motor exam as listed above    Non-Invasive Vascular  Imaging ABI (08/19/2017)  R:   ABI: 1.19 (1.29),   PT: tri  DP: bi  TBI:  0.74  L:   ABI: 1.12 (1.19),   Peroneal: weakly bi  PT: none  DP: bi  TBI: 0.79   Bypass Duplex (08/19/2017)  Proximal to bypass: 73 c/s  With-in bypass: 46-103 c/s  Distal to bypass: 100 c/s   Medical Decision Making   Brady Bell is a 62 y.o. male who presents with: s/p L iliofemoral EA w/ BPA, L CFA to AT bypass with ips NR GSV .   Based on the patient's vascular studies and examination, I have offered the patient: BLE ABI, bypass duplex, and aortoiliac duplex in 3 months.  I discussed in depth with the patient the nature of atherosclerosis, and emphasized the importance of maximal medical management including strict control of blood pressure, blood glucose, and lipid levels, obtaining regular exercise, and cessation of smoking.    The patient is aware that without maximal medical management the underlying atherosclerotic disease process will progress, limiting the benefit of any interventions.  I discussed in depth with the patient the need for long term surveillance to improve the primary assisted patency of his bypass.  The patient agrees to cooperate with such.  The patient is currently not on on statin as not medically indicated.   The patient is currently on an anti-platelet: ASA.  Thank you for allowing [place] to participate in this patient's care.   Brady Sake, MD, FACS [facility] Office: [phone] Pager: [phone]

## 2017-09-02 ENCOUNTER — Other Ambulatory Visit (HOSPITAL_COMMUNITY): Payer: Self-pay | Admitting: *Deleted

## 2017-09-02 DIAGNOSIS — D751 Secondary polycythemia: Secondary | ICD-10-CM

## 2017-09-03 ENCOUNTER — Inpatient Hospital Stay (HOSPITAL_COMMUNITY): Payer: Medicaid Other | Attending: Hematology

## 2017-09-03 ENCOUNTER — Encounter: Payer: Self-pay | Admitting: General Surgery

## 2017-09-03 ENCOUNTER — Ambulatory Visit: Payer: Medicaid Other | Admitting: General Surgery

## 2017-09-03 VITALS — BP 128/80 | HR 55 | Temp 97.1°F | Resp 16 | Wt 139.0 lb

## 2017-09-03 DIAGNOSIS — R5383 Other fatigue: Secondary | ICD-10-CM | POA: Insufficient documentation

## 2017-09-03 DIAGNOSIS — Z7982 Long term (current) use of aspirin: Secondary | ICD-10-CM | POA: Insufficient documentation

## 2017-09-03 DIAGNOSIS — D751 Secondary polycythemia: Secondary | ICD-10-CM | POA: Diagnosis present

## 2017-09-03 DIAGNOSIS — Z79899 Other long term (current) drug therapy: Secondary | ICD-10-CM | POA: Insufficient documentation

## 2017-09-03 DIAGNOSIS — I739 Peripheral vascular disease, unspecified: Secondary | ICD-10-CM | POA: Insufficient documentation

## 2017-09-03 DIAGNOSIS — K219 Gastro-esophageal reflux disease without esophagitis: Secondary | ICD-10-CM | POA: Insufficient documentation

## 2017-09-03 DIAGNOSIS — K851 Biliary acute pancreatitis without necrosis or infection: Secondary | ICD-10-CM

## 2017-09-03 DIAGNOSIS — F17210 Nicotine dependence, cigarettes, uncomplicated: Secondary | ICD-10-CM | POA: Diagnosis not present

## 2017-09-03 DIAGNOSIS — G43909 Migraine, unspecified, not intractable, without status migrainosus: Secondary | ICD-10-CM | POA: Diagnosis not present

## 2017-09-03 LAB — CBC WITH DIFFERENTIAL/PLATELET
BASOS PCT: 1 %
Basophils Absolute: 0.1 10*3/uL (ref 0.0–0.1)
EOS ABS: 0.5 10*3/uL (ref 0.0–0.7)
Eosinophils Relative: 7 %
HEMATOCRIT: 42.6 % (ref 39.0–52.0)
HEMOGLOBIN: 14.3 g/dL (ref 13.0–17.0)
LYMPHS ABS: 1.7 10*3/uL (ref 0.7–4.0)
Lymphocytes Relative: 26 %
MCH: 32.1 pg (ref 26.0–34.0)
MCHC: 33.6 g/dL (ref 30.0–36.0)
MCV: 95.7 fL (ref 78.0–100.0)
Monocytes Absolute: 0.8 10*3/uL (ref 0.1–1.0)
Monocytes Relative: 12 %
NEUTROS PCT: 54 %
Neutro Abs: 3.4 10*3/uL (ref 1.7–7.7)
Platelets: 180 10*3/uL (ref 150–400)
RBC: 4.45 MIL/uL (ref 4.22–5.81)
RDW: 12.6 % (ref 11.5–15.5)
WBC: 6.3 10*3/uL (ref 4.0–10.5)

## 2017-09-03 LAB — COMPREHENSIVE METABOLIC PANEL
ALBUMIN: 4 g/dL (ref 3.5–5.0)
ALK PHOS: 68 U/L (ref 38–126)
ALT: 10 U/L — AB (ref 17–63)
AST: 18 U/L (ref 15–41)
Anion gap: 9 (ref 5–15)
BUN: 9 mg/dL (ref 6–20)
CALCIUM: 9.7 mg/dL (ref 8.9–10.3)
CO2: 29 mmol/L (ref 22–32)
CREATININE: 0.77 mg/dL (ref 0.61–1.24)
Chloride: 93 mmol/L — ABNORMAL LOW (ref 101–111)
GFR calc Af Amer: >60 mL/min (ref >60)
GFR calc non Af Amer: >60 mL/min (ref >60)
GLUCOSE: 105 mg/dL — AB (ref 65–99)
Potassium: 4.6 mmol/L (ref 3.5–5.1)
SODIUM: 131 mmol/L — AB (ref 135–145)
Total Bilirubin: 1.1 mg/dL (ref 0.3–1.2)
Total Protein: 7.7 g/dL (ref 6.5–8.1)

## 2017-09-03 NOTE — Progress Notes (Signed)
Subjective:     Brady Bell  Patient here for follow-up of hospitalization for gallstone pancreatitis.  He states that he has decreased his alcohol intake and has not had any further episodes of epigastric pain or nausea.  He is being referred to my care for cholecystectomy. Objective:    BP 128/80 (BP Location: Left Arm, Patient Position: Sitting, Cuff Size: Normal)   Pulse (!) 55   Temp (!) 97.1 F (36.2 C) (Temporal)   Resp 16   Wt 139 lb (63 kg)   BMI 19.94 kg/m   General:  alert, cooperative and no distress  Head is normocephalic, atraumatic Eyes without scleral icterus Lungs are clear to auscultation with equal breath sounds bilaterally Heart examination reveals regular rate and rhythm.  I did not appreciate his history of atrial fibrillation. Abdomen is soft, nontender, nondistended.  No hepatosplenomegaly or masses noted.  Previous hospitalization discharge notes reviewed     Assessment:    Gallstone pancreatitis, history of alcohol abuse    Plan:  We will get follow-up ultrasound of the hepatobiliary tree as well as the pancreas to make sure the patient has not developed a pseudocyst of the pancreas.  He will follow-up here after his ultrasound.  Further management is pending those results.

## 2017-09-04 LAB — ERYTHROPOIETIN: Erythropoietin: 4.2 m[IU]/mL (ref 2.6–18.5)

## 2017-09-07 ENCOUNTER — Other Ambulatory Visit: Payer: Self-pay | Admitting: General Surgery

## 2017-09-07 DIAGNOSIS — K851 Biliary acute pancreatitis without necrosis or infection: Secondary | ICD-10-CM

## 2017-09-10 ENCOUNTER — Other Ambulatory Visit: Payer: Self-pay

## 2017-09-10 ENCOUNTER — Encounter (HOSPITAL_COMMUNITY): Payer: Self-pay | Admitting: Hematology

## 2017-09-10 ENCOUNTER — Inpatient Hospital Stay (HOSPITAL_BASED_OUTPATIENT_CLINIC_OR_DEPARTMENT_OTHER): Payer: Medicaid Other | Admitting: Hematology

## 2017-09-10 VITALS — BP 130/83 | HR 90 | Temp 98.3°F | Resp 20

## 2017-09-10 DIAGNOSIS — G43909 Migraine, unspecified, not intractable, without status migrainosus: Secondary | ICD-10-CM | POA: Diagnosis not present

## 2017-09-10 DIAGNOSIS — F17210 Nicotine dependence, cigarettes, uncomplicated: Secondary | ICD-10-CM | POA: Diagnosis not present

## 2017-09-10 DIAGNOSIS — I739 Peripheral vascular disease, unspecified: Secondary | ICD-10-CM

## 2017-09-10 DIAGNOSIS — R5383 Other fatigue: Secondary | ICD-10-CM | POA: Diagnosis not present

## 2017-09-10 DIAGNOSIS — Z79899 Other long term (current) drug therapy: Secondary | ICD-10-CM

## 2017-09-10 DIAGNOSIS — Z7982 Long term (current) use of aspirin: Secondary | ICD-10-CM | POA: Diagnosis not present

## 2017-09-10 DIAGNOSIS — D751 Secondary polycythemia: Secondary | ICD-10-CM | POA: Diagnosis not present

## 2017-09-10 DIAGNOSIS — K219 Gastro-esophageal reflux disease without esophagitis: Secondary | ICD-10-CM | POA: Diagnosis not present

## 2017-09-10 NOTE — Progress Notes (Signed)
[address], [place] [address]   CLINIC:  Medical Oncology/Hematology  PCP:  Sinda Du, MD [place] [address] [phone]   REASON FOR VISIT:  Follow-up for erythrocytosis.  CURRENT THERAPY: Intermittent phlebotomies.    INTERVAL HISTORY:  Mr. Harmon 62 y.o. male returns for follow-up of his erythrocytosis.  He has mild fatigue from recent hospitalization for acute pancreatitis.  It is thought secondary to gallstones.  Denies any major changes and is on and off migraine headaches.  He takes Depakote for it.  Denies any fevers, night sweats or weight loss.    REVIEW OF SYSTEMS:  Review of Systems  Constitutional: Positive for fatigue.  Hematological: Bruises/bleeds easily.  All other systems reviewed and are negative.    PAST MEDICAL/SURGICAL HISTORY:  Past Medical History:  Diagnosis Date  . Chronic ankle pain   . Chronic knee pain   . GERD (gastroesophageal reflux disease)   . Hepatitis    Hep A in 1980's  . Hypoglycemia   . Migraines   . Peripheral vascular disease ([place])   . Recurrent sinus infections   . Seizures ([employer])   . Shingles    Past Surgical History:  Procedure Laterality Date  . ABDOMINAL AORTOGRAM N/A 04/23/2017   Procedure: ABDOMINAL AORTOGRAM;  Surgeon: Conrad Hopkins, MD;  Location: [facility];  Service: Cardiovascular;  Laterality: N/A;  . ENDARTERECTOMY FEMORAL Left 04/28/2017   Procedure: ENDARTERECTOMY LEFT ILIO-FEMORAL ARTERY;  Surgeon: Conrad Ringwood, MD;  Location: [facility];  Service: Vascular;  Laterality: Left;  . FASCIOTOMY  09/06/2011   Procedure: FASCIOTOMY;  Surgeon: Conrad Williamsburg, MD;  Location: [facility];  Service: Vascular;  Laterality: Left;  . FEMORAL-TIBIAL BYPASS GRAFT  09/06/2011   Procedure: BYPASS GRAFT FEMORAL-TIBIAL ARTERY;  Surgeon: Conrad Rushmere, MD;  Location: [facility] [place] [facility];  Service: Vascular;  Laterality: Left;  . KNEE CARTILAGE SURGERY Left   . LOWER  EXTREMITY ANGIOGRAM N/A 09/05/2011   Procedure: LOWER EXTREMITY ANGIOGRAM;  Surgeon: Elam Dutch, MD;  Location: [facility] CATH LAB;  Service: Cardiovascular;  Laterality: N/A;  . LOWER EXTREMITY ANGIOGRAM Left 04/15/2012   Procedure: LOWER EXTREMITY ANGIOGRAM;  Surgeon: Conrad Carlsborg, MD;  Location: [facility] CATH LAB;  Service: Cardiovascular;  Laterality: Left;  . LOWER EXTREMITY ANGIOGRAPHY Bilateral 04/23/2017   Procedure: Lower Extremity Angiography;  Surgeon: Conrad Ashton, MD;  Location: [place] [facility];  Service: Cardiovascular;  Laterality: Bilateral;  . PATCH ANGIOPLASTY Left 04/28/2017   Procedure: ILIO-FEMORAL ARTERY PATCH ANGIOPLASTY USING Rueben Bash BIOLOGIC PATCH;  Surgeon: Conrad , MD;  [facility];  Service: Vascular;  Laterality: Left;  . PR VEIN BYPASS GRAFT,AORTO-FEM-POP  09/06/11   Left   . TOOTH EXTRACTION  June-July-Aug. 2015   Several      SOCIAL HISTORY:  Social History   Socioeconomic History  . Marital status: Single    Spouse name: Not on file  . Number of children: 0  . Years of education: some college  . Highest education level: Not on file  Occupational History  . Occupation: Disabled  Social Needs  . Financial resource strain: Not on file  . Food insecurity:    Worry: Not on file    Inability: Not on file  . Transportation needs:    Medical: Not on file    Non-medical: Not on file  Tobacco Use  . Smoking status: Current Every Day Smoker  Packs/day: 0.50    Years: 30.00    Pack years: 15.00    Types: Cigarettes  . Smokeless tobacco: Never Used  . Tobacco comment: pt states that he is trying his best to quit  Substance and Sexual Activity  . Alcohol use: Yes    Alcohol/week: 0.0 oz    Comment: Few drinks per day  . Drug use: Yes    Types: Marijuana    Comment: occasionally  . Sexual activity: Yes  Lifestyle  . Physical activity:    Days per week: Not on file    Minutes per session: Not on file  . Stress: Not on file  Relationships  .  Social connections:    Talks on phone: Not on file    Gets together: Not on file    Attends religious service: Not on file    Active member of club or organization: Not on file    Attends meetings of clubs or organizations: Not on file    Relationship status: Not on file  . Intimate partner violence:    Fear of current or ex partner: Not on file    Emotionally abused: Not on file    Physically abused: Not on file    Forced sexual activity: Not on file  Other Topics Concern  . Not on file  Social History Narrative   Lives alone.   Left-handed.   2-3 cups caffeine per day.    FAMILY HISTORY:  Family History  Problem Relation Age of Onset  . Alcohol abuse Father   . Kidney failure Father   . Other Mother        "age"  . Diabetes Unknown     CURRENT MEDICATIONS:  Outpatient Encounter Medications as of 09/10/2017  Medication Sig  . aspirin EC 81 MG tablet Take 81 mg by mouth daily.  . cycloSPORINE (RESTASIS) 0.05 % ophthalmic emulsion Place 1 drop into both eyes 2 (two) times daily.  Marland Kitchen diltiazem (CARDIZEM CD) 120 MG 24 hr capsule Take 1 capsule (120 mg total) by mouth daily.  . divalproex (DEPAKOTE ER) 500 MG 24 hr tablet Take 1 tablet (500 mg total) at bedtime by mouth. (Patient taking differently: Take 750 mg by mouth at bedtime. )  . eletriptan (RELPAX) 40 MG tablet Take 40 mg by mouth every 2 (two) hours as needed for migraine or headache. May repeat in 2 hours if headache persists or recurs.   Marland Kitchen escitalopram (LEXAPRO) 10 MG tablet Take 1 tablet (10 mg total) by mouth daily.  . fluticasone (FLONASE) 50 MCG/ACT nasal spray Place 2 sprays into both nostrils daily.  . folic acid (FOLVITE) 1 MG tablet Take 1 tablet (1 mg total) by mouth daily.  Marland Kitchen gabapentin (NEURONTIN) 300 MG capsule Take 300 mg by mouth daily.   . Multiple Vitamin (MULTIVITAMIN WITH MINERALS) TABS tablet Take 1 tablet by mouth daily.  . nicotine (NICODERM CQ - DOSED IN MG/24 HOURS) 21 mg/24hr patch Place 1 patch  (21 mg total) onto the skin daily.  . pantoprazole (PROTONIX) 40 MG tablet Take 1 tablet (40 mg total) by mouth daily before breakfast.  . SUMAtriptan (IMITREX STATDOSE SYSTEM) 6 MG/0.5ML SOAJ Inject 6 mg into the skin as needed. (Patient taking differently: Inject 6 mg into the skin as needed. )  . SUMAtriptan (IMITREX) 100 MG tablet Take 1 tablet (100 mg total) by mouth every 2 (two) hours as needed for migraine. May repeat in 2 hours if headache persists or recurs.  Marland Kitchen  traMADol (ULTRAM) 50 MG tablet Take 1 tablet (50 mg total) by mouth every 6 (six) hours as needed.   No facility-administered encounter medications on file as of 09/10/2017.     ALLERGIES:  No Known Allergies   PHYSICAL EXAM:  ECOG Performance status: 1  Vitals:   09/10/17 1124  BP: 130/83  Pulse: 90  Resp: 20  Temp: 98.3 F (36.8 C)  SpO2: 95%   There were no vitals filed for this visit.  Physical Exam HEENT: No adenopathy.  No mucositis or thrush. Chest: Bilateral clear to auscultation. CVS: S1-S2 regular rate and rhythm. Abdomen: Slight tenderness in the upper quadrants with no palpable splenomegaly. Extremities: No edema or cyanosis.  LABORATORY DATA:  I have reviewed the labs as listed.  CBC    Component Value Date/Time   WBC 6.3 09/03/2017 1015   RBC 4.45 09/03/2017 1015   HGB 14.3 09/03/2017 1015   HGB 20.2 (HH) 12/08/2016 1049   HCT 42.6 09/03/2017 1015   HCT 58.3 (H) 12/08/2016 1049   PLT 180 09/03/2017 1015   PLT 168 12/08/2016 1049   MCV 95.7 09/03/2017 1015   MCV 101 (H) 12/08/2016 1049   MCH 32.1 09/03/2017 1015   MCHC 33.6 09/03/2017 1015   RDW 12.6 09/03/2017 1015   RDW 14.2 12/08/2016 1049   LYMPHSABS 1.7 09/03/2017 1015   MONOABS 0.8 09/03/2017 1015   EOSABS 0.5 09/03/2017 1015   BASOSABS 0.1 09/03/2017 1015   CMP Latest Ref Rng & Units 09/03/2017 08/02/2017 08/01/2017  Glucose 65 - 99 mg/dL 105(H) 82 94  BUN 6 - 20 mg/dL 9 <5(L) <5(L)  Creatinine 0.61 - 1.24 mg/dL 0.77 0.78 0.62   Sodium 135 - 145 mmol/L 131(L) 131(L) 136  Potassium 3.5 - 5.1 mmol/L 4.6 4.1 3.6  Chloride 101 - 111 mmol/L 93(L) 95(L) 101  CO2 22 - 32 mmol/L _0 Calcium 8.9 - 10.3 mg/dL 9.7 8.6(L) 8.5(L)  Total Protein 6.5 - 8.1 g/dL 7.7 - 5.7(L)  Total Bilirubin 0.3 - 1.2 mg/dL 1.1 - 0.6  Alkaline Phos 38 - 126 U/L 68 - 82  AST 15 - 41 U/L 18 - 23  ALT 17 - 63 U/L 10(L) - 15(L)       DIAGNOSTIC IMAGING:  I have reviewed imaging from his recent hospitalization.  Numerous calcified granulomas within the spleen without enlargement was seen.     ASSESSMENT & PLAN:   Erythrocytosis 1.  Secondary erythrocytosis: - Evaluation including Jak 2,CALR, exon 12-15, MPL W mutations negative.  EPO level of 4.2.  Bone marrow biopsy was not done. -Last phlebotomy was in December 2018. - Had a recent hospitalization with acute pancreatitis. - CBC on 09/03/2017 shows hemoglobin of 14.3 and hematocrit of 42.6.  Has chronic migraine headaches on and off, on Depakote which helps.  Denies any fevers, night sweats or weight loss.  Does not require any phlebotomy at this time.  We will see him back in 6 months for follow-up with repeat blood counts.  We will consider doing a bone marrow aspiration and biopsy if his blood counts go back high.      Orders placed this encounter:  Orders Placed This Encounter  Procedures  . CBC with Differential  . Comprehensive metabolic panel      Derek Jack, MD [place] [phone]

## 2017-09-10 NOTE — Assessment & Plan Note (Signed)
1.  Secondary erythrocytosis: - Evaluation including Jak 2,CALR, exon 12-15, MPL W mutations negative.  EPO level of 4.2.  Bone marrow biopsy was not done. -Last phlebotomy was in December 2018. - Had a recent hospitalization with acute pancreatitis. - CBC on 09/03/2017 shows hemoglobin of 14.3 and hematocrit of 42.6.  Has chronic migraine headaches on and off, on Depakote which helps.  Denies any fevers, night sweats or weight loss.  Does not require any phlebotomy at this time.  We will see him back in 6 months for follow-up with repeat blood counts.  We will consider doing a bone marrow aspiration and biopsy if his blood counts go back high.

## 2017-09-11 ENCOUNTER — Ambulatory Visit (HOSPITAL_COMMUNITY)
Admission: RE | Admit: 2017-09-11 | Discharge: 2017-09-11 | Disposition: A | Discharge status: Home / Self Care | Payer: Medicaid Other | Source: Ambulatory Visit | Attending: General Surgery | Admitting: General Surgery

## 2017-09-11 ENCOUNTER — Other Ambulatory Visit: Payer: Self-pay

## 2017-09-11 DIAGNOSIS — I779 Disorder of arteries and arterioles, unspecified: Secondary | ICD-10-CM

## 2017-09-11 DIAGNOSIS — I739 Peripheral vascular disease, unspecified: Secondary | ICD-10-CM

## 2017-09-11 DIAGNOSIS — I70212 Atherosclerosis of native arteries of extremities with intermittent claudication, left leg: Secondary | ICD-10-CM

## 2017-09-11 DIAGNOSIS — K851 Biliary acute pancreatitis without necrosis or infection: Secondary | ICD-10-CM | POA: Diagnosis present

## 2017-09-11 DIAGNOSIS — K8020 Calculus of gallbladder without cholecystitis without obstruction: Secondary | ICD-10-CM | POA: Diagnosis not present

## 2017-09-11 DIAGNOSIS — K7689 Other specified diseases of liver: Secondary | ICD-10-CM | POA: Diagnosis not present

## 2017-09-17 ENCOUNTER — Ambulatory Visit: Payer: Medicaid Other | Admitting: General Surgery

## 2017-09-17 ENCOUNTER — Encounter: Payer: Self-pay | Admitting: General Surgery

## 2017-09-17 VITALS — BP 135/91 | HR 75 | Temp 97.3°F | Resp 20 | Wt 140.0 lb

## 2017-09-17 DIAGNOSIS — K851 Biliary acute pancreatitis without necrosis or infection: Secondary | ICD-10-CM | POA: Diagnosis not present

## 2017-09-17 NOTE — H&P (Signed)
Brady Bell is an 62 y.o. male.   Chief Complaint: Gallstone pancreatitis HPI: Patient is a 62 year old white male with a history of gallstone pancreatitis who now presents for laparoscopic cholecystectomy.  He has had minimal epigastric and right upper quadrant pain since his admission for gallstone pancreatitis.  He recently had an ultrasound of the gallbladder which revealed cholelithiasis and a normal common bile duct.  There was no evidence of pseudocyst of the pancreas.  He currently has minimal abdominal pain.  He does have some mild epigastric pain and nausea with fatty meals.  He has decreased his alcohol intake.  Past Medical History:  Diagnosis Date  . Chronic ankle pain   . Chronic knee pain   . GERD (gastroesophageal reflux disease)   . Hepatitis    Hep A in 1980's  . Hypoglycemia   . Migraines   . Peripheral vascular disease ([facility])   . Recurrent sinus infections   . Seizures ([facility])   . Shingles     Past Surgical History:  Procedure Laterality Date  . ABDOMINAL AORTOGRAM N/A 04/23/2017   Procedure: ABDOMINAL AORTOGRAM;  Surgeon: Brady Hertz, MD;  Location: [facility] INVASIVE CV LAB;  Service: Cardiovascular;  Laterality: N/A;  . ENDARTERECTOMY FEMORAL Left 04/28/2017   Procedure: ENDARTERECTOMY LEFT ILIO-FEMORAL ARTERY;  Surgeon: Brady Hertz, MD;  Location: [facility] OR;  Service: Vascular;  Laterality: Left;  . FASCIOTOMY  09/06/2011   Procedure: FASCIOTOMY;  Surgeon: Brady Hertz, MD;  Location[facility] OR;  Service: Vascular;  Laterality: Left;  . FEMORAL-TIBIAL BYPASS GRAFT  09/06/2011   Procedure: BYPASS GRAFT FEMORAL-TIBIAL ARTERY;  Surgeon: Brady Hertz, MD;  Location: [facility] OR;  Service: Vascular;  Laterality: Left;  . KNEE CARTILAGE SURGERY Left   . LOWER EXTREMITY ANGIOGRAM N/A 09/05/2011   Procedure: LOWER EXTREMITY ANGIOGRAM;  Surgeon: Brady Kerns, MD;  Location: [facility] CATH LAB;  Service: Cardiovascular;  Laterality: N/A;  . LOWER EXTREMITY ANGIOGRAM Left 04/15/2012   Procedure:  LOWER EXTREMITY ANGIOGRAM;  Surgeon: Brady Hertz, MD;  Location: [facility] CATH LAB;  Service: Cardiovascular;  Laterality: Left;  . LOWER EXTREMITY ANGIOGRAPHY Bilateral 04/23/2017   Procedure: Lower Extremity Angiography;  Surgeon: Brady Hertz, MD;  Location: [facility] INVASIVE CV LAB;  Service: Cardiovascular;  Laterality: Bilateral;  . PATCH ANGIOPLASTY Left 04/28/2017   Procedure: ILIO-FEMORAL ARTERY PATCH ANGIOPLASTY USING Brady Bell BIOLOGIC PATCH;  Surgeon: Brady Hertz, MD;  Location: [facility] OR;  Service: Vascular;  Laterality: Left;  . PR VEIN BYPASS GRAFT,AORTO-FEM-POP  09/06/11   Left   . TOOTH EXTRACTION  June-July-Aug. 2015   Several     Family History  Problem Relation Age of Onset  . Alcohol abuse Father   . Kidney failure Father   . Other Mother        "age"  . Diabetes Unknown    Social History:  reports that he has been smoking cigarettes.  He has a 15.00 pack-year smoking history. He has never used smokeless tobacco. He reports that he drinks alcohol. He reports that he has current or past drug history. Drug: Marijuana.  Allergies: No Known Allergies  No medications prior to admission.    No results found for this or any previous visit (from the past 48 hour(s)). No results found.  Review of Systems  Constitutional: Negative.   HENT: Negative.   Eyes: Negative.   Respiratory: Negative.   Cardiovascular: Negative.   Gastrointestinal: Positive for heartburn and nausea.  Genitourinary: Negative.  Musculoskeletal: Negative.   Skin: Negative.   Neurological: Negative.   Endo/Heme/Allergies: Negative.   Psychiatric/Behavioral: Negative.     There were no vitals taken for this visit. Physical Exam  Vitals reviewed. Constitutional: He is oriented to person, place, and time. He appears well-developed and well-nourished.  HENT:  Head: Normocephalic and atraumatic.  Cardiovascular: Normal rate, regular rhythm and normal heart sounds. Exam reveals no gallop and no friction rub.   No murmur heard. Respiratory: Effort normal and breath sounds normal. No respiratory distress. He has no wheezes. He has no rales.  GI: Soft. Bowel sounds are normal. He exhibits no distension. There is no tenderness. There is no guarding.  Neurological: He is alert and oriented to person, place, and time.  Walks with a cane  Skin: Skin is warm and dry.     Assessment/Plan Impression: History of gallstone pancreatitis, history of alcohol abuse Plan: Patient scheduled for laparoscopic cholecystectomy on 09/23/2017.  The risks and benefits of the procedure including bleeding, infection, hepatobiliary injury, and the possibility of an open procedure were fully explained to the patient, who gave informed consent.  Brady MachoMark Maicy Filip, MD 09/17/2017, 11:28 AM

## 2017-09-17 NOTE — Patient Instructions (Signed)

## 2017-09-17 NOTE — Progress Notes (Signed)
Subjective:     Brady Bell  Patient is here for follow-up of his ultrasound.  Ultrasound shows cholelithiasis but there is no evidence of pseudocyst of the pancreas. Objective:    BP (!) 135/91 (BP Location: Left Arm, Patient Position: Sitting, Cuff Size: Normal)   Pulse 75   Temp (!) 97.3 F (36.3 C) (Temporal)   Resp 20   Wt 140 lb (63.5 kg)   BMI 20.09 kg/m   General:  alert, cooperative and no distress       Assessment:    Gallstone pancreatitis    Plan:   Patient is scheduled for laparoscopic cholecystectomy on 09/23/2017.  The risks and benefits of the procedure including bleeding, infection, hepatobiliary injury, and the possibility of an open procedure were fully explained to the patient, who gave informed consent.

## 2017-09-18 ENCOUNTER — Encounter (HOSPITAL_COMMUNITY): Payer: Self-pay

## 2017-09-21 ENCOUNTER — Encounter (HOSPITAL_COMMUNITY)
Admission: RE | Admit: 2017-09-21 | Discharge: 2017-09-21 | Disposition: A | Discharge status: Home / Self Care | Payer: Medicaid Other | Source: Ambulatory Visit | Attending: General Surgery | Admitting: General Surgery

## 2017-09-23 ENCOUNTER — Encounter (HOSPITAL_COMMUNITY): Payer: Self-pay

## 2017-09-23 ENCOUNTER — Ambulatory Visit (HOSPITAL_COMMUNITY): Payer: Medicaid Other | Admitting: Anesthesiology

## 2017-09-23 ENCOUNTER — Encounter (HOSPITAL_COMMUNITY)
Admission: RE | Disposition: A | Discharge status: Home / Self Care | Payer: Self-pay | Source: Ambulatory Visit | Attending: General Surgery

## 2017-09-23 ENCOUNTER — Ambulatory Visit (HOSPITAL_COMMUNITY)
Admission: RE | Admit: 2017-09-23 | Discharge: 2017-09-23 | Disposition: A | Discharge status: Home / Self Care | Payer: Medicaid Other | Source: Ambulatory Visit | Attending: General Surgery | Admitting: General Surgery

## 2017-09-23 DIAGNOSIS — K8010 Calculus of gallbladder with chronic cholecystitis without obstruction: Secondary | ICD-10-CM | POA: Insufficient documentation

## 2017-09-23 DIAGNOSIS — F1010 Alcohol abuse, uncomplicated: Secondary | ICD-10-CM | POA: Diagnosis not present

## 2017-09-23 DIAGNOSIS — Z8619 Personal history of other infectious and parasitic diseases: Secondary | ICD-10-CM | POA: Insufficient documentation

## 2017-09-23 DIAGNOSIS — I10 Essential (primary) hypertension: Secondary | ICD-10-CM | POA: Insufficient documentation

## 2017-09-23 DIAGNOSIS — I739 Peripheral vascular disease, unspecified: Secondary | ICD-10-CM | POA: Diagnosis not present

## 2017-09-23 DIAGNOSIS — I4891 Unspecified atrial fibrillation: Secondary | ICD-10-CM | POA: Insufficient documentation

## 2017-09-23 DIAGNOSIS — F17210 Nicotine dependence, cigarettes, uncomplicated: Secondary | ICD-10-CM | POA: Diagnosis not present

## 2017-09-23 DIAGNOSIS — K219 Gastro-esophageal reflux disease without esophagitis: Secondary | ICD-10-CM | POA: Diagnosis not present

## 2017-09-23 DIAGNOSIS — K851 Biliary acute pancreatitis without necrosis or infection: Secondary | ICD-10-CM

## 2017-09-23 HISTORY — PX: CHOLECYSTECTOMY: SHX55

## 2017-09-23 SURGERY — LAPAROSCOPIC CHOLECYSTECTOMY
Anesthesia: General

## 2017-09-23 MED ORDER — LIDOCAINE HCL (CARDIAC) PF 100 MG/5ML IV SOSY
PREFILLED_SYRINGE | INTRAVENOUS | Status: DC | PRN
  Administered 2017-09-23: 30 mg via INTRAVENOUS

## 2017-09-23 MED ORDER — PROPOFOL 10 MG/ML IV BOLUS
INTRAVENOUS | Status: DC | PRN
  Administered 2017-09-23: 150 mg via INTRAVENOUS

## 2017-09-23 MED ORDER — ROCURONIUM BROMIDE 100 MG/10ML IV SOLN
INTRAVENOUS | Status: DC | PRN
  Administered 2017-09-23: 10 mg via INTRAVENOUS
  Administered 2017-09-23: 20 mg via INTRAVENOUS

## 2017-09-23 MED ORDER — HEMOSTATIC AGENTS (NO CHARGE) OPTIME
TOPICAL | Status: DC | PRN
  Administered 2017-09-23 (×2): 1 via TOPICAL

## 2017-09-23 MED ORDER — LACTATED RINGERS IV SOLN
INTRAVENOUS | Status: DC
  Administered 2017-09-23 (×2): via INTRAVENOUS

## 2017-09-23 MED ORDER — BUPIVACAINE HCL (PF) 0.5 % IJ SOLN
INTRAMUSCULAR | Status: DC | PRN
  Administered 2017-09-23: 10 mL

## 2017-09-23 MED ORDER — ROCURONIUM BROMIDE 50 MG/5ML IV SOLN
INTRAVENOUS | Status: AC
  Filled 2017-09-23: qty 1

## 2017-09-23 MED ORDER — LIDOCAINE HCL (PF) 1 % IJ SOLN
INTRAMUSCULAR | Status: AC
  Filled 2017-09-23: qty 5

## 2017-09-23 MED ORDER — EPHEDRINE SULFATE 50 MG/ML IJ SOLN
INTRAMUSCULAR | Status: AC
  Filled 2017-09-23: qty 1

## 2017-09-23 MED ORDER — ONDANSETRON HCL 4 MG/2ML IJ SOLN: 4.0000 mg | Freq: Once | INTRAMUSCULAR | Status: DC | PRN

## 2017-09-23 MED ORDER — MIDAZOLAM HCL 2 MG/2ML IJ SOLN
INTRAMUSCULAR | Status: AC
  Filled 2017-09-23: qty 2

## 2017-09-23 MED ORDER — MEPERIDINE HCL 50 MG/ML IJ SOLN: 6.2500 mg | INTRAMUSCULAR | Status: DC | PRN

## 2017-09-23 MED ORDER — POVIDONE-IODINE 10 % EX OINT
TOPICAL_OINTMENT | CUTANEOUS | Status: AC
  Filled 2017-09-23: qty 1

## 2017-09-23 MED ORDER — SODIUM CHLORIDE 0.9 % IJ SOLN
INTRAMUSCULAR | Status: AC
  Filled 2017-09-23: qty 10

## 2017-09-23 MED ORDER — SUGAMMADEX SODIUM 200 MG/2ML IV SOLN
INTRAVENOUS | Status: DC | PRN
  Administered 2017-09-23: 127 mg via INTRAVENOUS

## 2017-09-23 MED ORDER — POVIDONE-IODINE 10 % OINT PACKET
TOPICAL_OINTMENT | CUTANEOUS | Status: DC | PRN
  Administered 2017-09-23: 1 via TOPICAL

## 2017-09-23 MED ORDER — FENTANYL CITRATE (PF) 250 MCG/5ML IJ SOLN
INTRAMUSCULAR | Status: AC
  Filled 2017-09-23: qty 5

## 2017-09-23 MED ORDER — CHLORHEXIDINE GLUCONATE CLOTH 2 % EX PADS: 6.0000 | MEDICATED_PAD | Freq: Once | CUTANEOUS | Status: DC

## 2017-09-23 MED ORDER — HYDROCODONE-ACETAMINOPHEN 7.5-325 MG PO TABS
1.0000 | ORAL_TABLET | Freq: Once | ORAL | Status: AC | PRN
  Administered 2017-09-23: 1 via ORAL
  Filled 2017-09-23: qty 1

## 2017-09-23 MED ORDER — ONDANSETRON HCL 4 MG/2ML IJ SOLN
INTRAMUSCULAR | Status: AC
  Filled 2017-09-23: qty 2

## 2017-09-23 MED ORDER — HYDROMORPHONE HCL 1 MG/ML IJ SOLN
0.2500 mg | INTRAMUSCULAR | Status: DC | PRN
  Administered 2017-09-23 (×2): 0.5 mg via INTRAVENOUS
  Filled 2017-09-23 (×2): qty 0.5

## 2017-09-23 MED ORDER — KETOROLAC TROMETHAMINE 30 MG/ML IJ SOLN
INTRAMUSCULAR | Status: AC
  Filled 2017-09-23: qty 1

## 2017-09-23 MED ORDER — KETOROLAC TROMETHAMINE 30 MG/ML IJ SOLN
30.0000 mg | Freq: Once | INTRAMUSCULAR | Status: AC | PRN
  Administered 2017-09-23: 30 mg via INTRAVENOUS

## 2017-09-23 MED ORDER — BUPIVACAINE HCL (PF) 0.5 % IJ SOLN
INTRAMUSCULAR | Status: AC
  Filled 2017-09-23: qty 30

## 2017-09-23 MED ORDER — SUCCINYLCHOLINE CHLORIDE 20 MG/ML IJ SOLN
INTRAMUSCULAR | Status: DC | PRN
  Administered 2017-09-23: 120 mg via INTRAVENOUS

## 2017-09-23 MED ORDER — FENTANYL CITRATE (PF) 100 MCG/2ML IJ SOLN
INTRAMUSCULAR | Status: DC | PRN
  Administered 2017-09-23 (×5): 50 ug via INTRAVENOUS

## 2017-09-23 MED ORDER — OXYCODONE-ACETAMINOPHEN 7.5-325 MG PO TABS: 1.0000 | ORAL_TABLET | Freq: Four times a day (QID) | ORAL | 0 refills | Status: DC | PRN

## 2017-09-23 MED ORDER — CIPROFLOXACIN IN D5W 400 MG/200ML IV SOLN
400.0000 mg | INTRAVENOUS | Status: AC
  Administered 2017-09-23: 400 mg via INTRAVENOUS
  Filled 2017-09-23: qty 200

## 2017-09-23 MED ORDER — DILTIAZEM HCL ER COATED BEADS 120 MG PO TB24
120.0000 mg | ORAL_TABLET | ORAL | Status: AC
  Administered 2017-09-23: 120 mg via ORAL
  Filled 2017-09-23: qty 1

## 2017-09-23 MED ORDER — SODIUM CHLORIDE 0.9 % IR SOLN
Status: DC | PRN
  Administered 2017-09-23: 1000 mL

## 2017-09-23 MED ORDER — EPHEDRINE SULFATE 50 MG/ML IJ SOLN
INTRAMUSCULAR | Status: DC | PRN
  Administered 2017-09-23: 10 mg via INTRAVENOUS

## 2017-09-23 MED ORDER — KETOROLAC TROMETHAMINE 30 MG/ML IJ SOLN: 30.0000 mg | Freq: Once | INTRAMUSCULAR | Status: DC

## 2017-09-23 MED ORDER — PROPOFOL 10 MG/ML IV BOLUS
INTRAVENOUS | Status: AC
  Filled 2017-09-23: qty 20

## 2017-09-23 MED ORDER — MIDAZOLAM HCL 5 MG/5ML IJ SOLN
INTRAMUSCULAR | Status: DC | PRN
  Administered 2017-09-23: 2 mg via INTRAVENOUS

## 2017-09-23 SURGICAL SUPPLY — 52 items
APL SRG 38 LTWT LNG FL B (MISCELLANEOUS) ×1
APPLICATOR ARISTA FLEXITIP XL (MISCELLANEOUS) ×2 IMPLANT
APPLIER CLIP ROT 10 11.4 M/L (STAPLE) ×3
APR CLP MED LRG 11.4X10 (STAPLE) ×1
BAG RETRIEVAL 10 (BASKET) ×1
BAG RETRIEVAL 10MM (BASKET) ×1
CHLORAPREP W/TINT 26ML (MISCELLANEOUS) ×3 IMPLANT
CLIP APPLIE ROT 10 11.4 M/L (STAPLE) ×1 IMPLANT
CLOTH BEACON ORANGE TIMEOUT ST (SAFETY) ×3 IMPLANT
COVER LIGHT HANDLE STERIS (MISCELLANEOUS) ×6 IMPLANT
DECANTER SPIKE VIAL GLASS SM (MISCELLANEOUS) ×3 IMPLANT
ELECT REM PT RETURN 9FT ADLT (ELECTROSURGICAL) ×3
ELECTRODE REM PT RTRN 9FT ADLT (ELECTROSURGICAL) ×1 IMPLANT
FILTER SMOKE EVAC LAPAROSHD (FILTER) ×3 IMPLANT
GLOVE BIOGEL PI IND STRL 7.0 (GLOVE) ×1 IMPLANT
GLOVE BIOGEL PI INDICATOR 7.0 (GLOVE) ×2
GLOVE ECLIPSE 6.5 STRL STRAW (GLOVE) ×2 IMPLANT
GLOVE ECLIPSE 7.0 STRL STRAW (GLOVE) ×4 IMPLANT
GLOVE SURG SS PI 7.5 STRL IVOR (GLOVE) ×3 IMPLANT
GOWN STRL REUS W/ TWL XL LVL3 (GOWN DISPOSABLE) ×1 IMPLANT
GOWN STRL REUS W/TWL LRG LVL3 (GOWN DISPOSABLE) ×6 IMPLANT
GOWN STRL REUS W/TWL XL LVL3 (GOWN DISPOSABLE) ×3
HEMOSTAT ARISTA ABSORB 3G PWDR (MISCELLANEOUS) ×2 IMPLANT
HEMOSTAT SNOW SURGICEL 2X4 (HEMOSTASIS) ×3 IMPLANT
INST SET LAPROSCOPIC AP (KITS) ×3 IMPLANT
IV NS IRRIG 3000ML ARTHROMATIC (IV SOLUTION) IMPLANT
KIT TURNOVER KIT A (KITS) ×3 IMPLANT
MANIFOLD NEPTUNE II (INSTRUMENTS) ×3 IMPLANT
NDL HYPO 25X1 1.5 SAFETY (NEEDLE) ×1 IMPLANT
NDL INSUFFLATION 14GA 120MM (NEEDLE) ×1 IMPLANT
NEEDLE HYPO 25X1 1.5 SAFETY (NEEDLE) ×3 IMPLANT
NEEDLE INSUFFLATION 14GA 120MM (NEEDLE) ×3 IMPLANT
NS IRRIG 1000ML POUR BTL (IV SOLUTION) ×3 IMPLANT
PACK LAP CHOLE LZT030E (CUSTOM PROCEDURE TRAY) ×3 IMPLANT
PAD ARMBOARD 7.5X6 YLW CONV (MISCELLANEOUS) ×3 IMPLANT
SET BASIN LINEN APH (SET/KITS/TRAYS/PACK) ×3 IMPLANT
SET TUBE IRRIG SUCTION NO TIP (IRRIGATION / IRRIGATOR) IMPLANT
SLEEVE ENDOPATH XCEL 5M (ENDOMECHANICALS) ×3 IMPLANT
SPONGE GAUZE 2X2 8PLY STER LF (GAUZE/BANDAGES/DRESSINGS) ×4
SPONGE GAUZE 2X2 8PLY STRL LF (GAUZE/BANDAGES/DRESSINGS) ×8 IMPLANT
STAPLER VISISTAT (STAPLE) ×3 IMPLANT
SUT VICRYL 0 UR6 27IN ABS (SUTURE) ×3 IMPLANT
SYS BAG RETRIEVAL 10MM (BASKET) ×1
SYSTEM BAG RETRIEVAL 10MM (BASKET) ×1 IMPLANT
TAPE CLOTH SURG 4X10 WHT LF (GAUZE/BANDAGES/DRESSINGS) ×2 IMPLANT
TROCAR ENDO BLADELESS 11MM (ENDOMECHANICALS) ×3 IMPLANT
TROCAR XCEL NON-BLD 5MMX100MML (ENDOMECHANICALS) ×3 IMPLANT
TROCAR XCEL UNIV SLVE 11M 100M (ENDOMECHANICALS) ×3 IMPLANT
TUBE CONNECTING 12'X1/4 (SUCTIONS) ×1
TUBE CONNECTING 12X1/4 (SUCTIONS) ×2 IMPLANT
TUBING INSUFFLATION (TUBING) ×3 IMPLANT
WARMER LAPAROSCOPE (MISCELLANEOUS) ×3 IMPLANT

## 2017-09-23 NOTE — Anesthesia Procedure Notes (Signed)
Procedure Name: Intubation Date/Time: 09/23/2017 11:01 AM Performed by: Pernell DupreAdams, Shakiyah Cirilo A, CRNA Pre-anesthesia Checklist: Patient identified, Patient being monitored, Timeout performed, Emergency Drugs available and Suction available Patient Re-evaluated:Patient Re-evaluated prior to induction Oxygen Delivery Method: Circle System Utilized and Circle system utilized Preoxygenation: Pre-oxygenation with 100% oxygen Induction Type: IV induction Ventilation: Mask ventilation without difficulty Laryngoscope Size: Miller and 3 Grade View: Grade III Tube type: Oral Tube size: 7.0 mm Number of attempts: 1 Airway Equipment and Method: Stylet Placement Confirmation: ETT inserted through vocal cords under direct vision,  positive ETCO2 and breath sounds checked- equal and bilateral Secured at: 21 cm Tube secured with: Tape Dental Injury: Teeth and Oropharynx as per pre-operative assessment

## 2017-09-23 NOTE — Discharge Instructions (Signed)
Laparoscopic Cholecystectomy, Care After °This sheet gives you information about how to care for yourself after your procedure. Your health care provider may also give you more specific instructions. If you have problems or questions, contact your health care provider. °What can I expect after the procedure? °After the procedure, it is common to have: °· Pain at your incision sites. You will be given medicines to control this pain. °· Mild nausea or vomiting. °· Bloating and possible shoulder pain from the air-like gas that was used during the procedure. ° °Follow these instructions at home: °Incision care ° °· Follow instructions from your health care provider about how to take care of your incisions. Make sure you: °? Wash your hands with soap and water before you change your bandage (dressing). If soap and water are not available, use hand sanitizer. °? Change your dressing as told by your health care provider. °? Leave stitches (sutures), skin glue, or adhesive strips in place. These skin closures may need to be in place for 2 weeks or longer. If adhesive strip edges start to loosen and curl up, you may trim the loose edges. Do not remove adhesive strips completely unless your health care provider tells you to do that. °· Do not take baths, swim, or use a hot tub until your health care provider approves. Ask your health care provider if you can take showers. You may only be allowed to take sponge baths for bathing. °· Check your incision area every day for signs of infection. Check for: °? More redness, swelling, or pain. °? More fluid or blood. °? Warmth. °? Pus or a bad smell. °Activity °· Do not drive or use heavy machinery while taking prescription pain medicine. °· Do not lift anything that is heavier than 10 lb (4.5 kg) until your health care provider approves. °· Do not play contact sports until your health care provider approves. °· Do not drive for 24 hours if you were given a medicine to help you relax  (sedative). °· Rest as needed. Do not return to work or school until your health care provider approves. °General instructions °· Take over-the-counter and prescription medicines only as told by your health care provider. °· To prevent or treat constipation while you are taking prescription pain medicine, your health care provider may recommend that you: °? Drink enough fluid to keep your urine clear or pale yellow. °? Take over-the-counter or prescription medicines. °? Eat foods that are high in fiber, such as fresh fruits and vegetables, whole grains, and beans. °? Limit foods that are high in fat and processed sugars, such as fried and sweet foods. °Contact a health care provider if: °· You develop a rash. °· You have more redness, swelling, or pain around your incisions. °· You have more fluid or blood coming from your incisions. °· Your incisions feel warm to the touch. °· You have pus or a bad smell coming from your incisions. °· You have a fever. °· One or more of your incisions breaks open. °Get help right away if: °· You have trouble breathing. °· You have chest pain. °· You have increasing pain in your shoulders. °· You faint or feel dizzy when you stand. °· You have severe pain in your abdomen. °· You have nausea or vomiting that lasts for more than one day. °· You have leg pain. °This information is not intended to replace advice given to you by your health care provider. Make sure you discuss any questions you   have with your health care provider. °Document Released: 03/17/2005 Document Revised: 10/06/2015 Document Reviewed: 09/03/2015 °Elsevier Interactive Patient Education © 2018 [employer] ° °

## 2017-09-23 NOTE — Anesthesia Preprocedure Evaluation (Addendum)
Anesthesia Evaluation  Patient identified by MRN, date of birth, ID band Patient awake    Reviewed: Allergy & Precautions, H&P , NPO status , Patient's Chart, lab work & pertinent test results, reviewed documented beta blocker date and time   Airway Mallampati: I  TM Distance: >3 FB Neck ROM: full    Dental no notable dental hx. (+) Missing   Pulmonary neg pulmonary ROS, Current Smoker,    Pulmonary exam normal breath sounds clear to auscultation       Cardiovascular Exercise Tolerance: Good hypertension, + Peripheral Vascular Disease  negative cardio ROS  + dysrhythmias Atrial Fibrillation  Rhythm:regular Rate:Normal     Neuro/Psych  Headaches, Seizures -,  negative neurological ROS  negative psych ROS   GI/Hepatic negative GI ROS, Neg liver ROS, GERD  ,(+) Hepatitis -  Endo/Other  negative endocrine ROS  Renal/GU negative Renal ROS  negative genitourinary   Musculoskeletal   Abdominal   Peds  Hematology negative hematology ROS (+)   Anesthesia Other Findings H/O: Acute alcoholic pancreatitis H/O: Acute pancreatitis  Echo: EF 65%  Reproductive/Obstetrics negative OB ROS                            Anesthesia Physical Anesthesia Plan  ASA: III  Anesthesia Plan: General   Post-op Pain Management:    Induction:   PONV Risk Score and Plan:   Airway Management Planned:   Additional Equipment:   Intra-op Plan:   Post-operative Plan:   Informed Consent: I have reviewed the patients History and Physical, chart, labs and discussed the procedure including the risks, benefits and alternatives for the proposed anesthesia with the patient or authorized representative who has indicated his/her understanding and acceptance.   Dental Advisory Given  Plan Discussed with: CRNA  Anesthesia Plan Comments:         Anesthesia Quick Evaluation

## 2017-09-23 NOTE — Anesthesia Postprocedure Evaluation (Signed)
Anesthesia Post Note Late Entry for 1224  Patient: Brady FredericksonKenneth L Shimer  Procedure(s) Performed: LAPAROSCOPIC CHOLECYSTECTOMY (N/A )  Patient location during evaluation: PACU Anesthesia Type: General Level of consciousness: awake and alert and oriented Pain management: pain level controlled Vital Signs Assessment: post-procedure vital signs reviewed and stable Respiratory status: spontaneous breathing Cardiovascular status: stable Postop Assessment: no apparent nausea or vomiting Anesthetic complications: no     Last Vitals:  Vitals:   09/23/17 1230 09/23/17 1246  BP: (!) 152/97 (!) 146/94  Pulse: 67 66  Resp: 14   Temp:    SpO2: 97% 94%    Last Pain:  Vitals:   09/23/17 1246  TempSrc:   PainSc: 4                  Moua Rasmusson A

## 2017-09-23 NOTE — Op Note (Signed)
Patient:  Brady Bell  DOB:  09/02/1955  MRN:  [id number]   Preop Diagnosis: Gallstone pancreatitis  Postop Diagnosis: Same  Procedure: Laparoscopic cholecystectomy  Surgeon: Franky MachoMark Kohle Winner, MD  Anes: General endotracheal  Indications: Patient is a 62 year old white male who presents with a history of pancreatitis due to both gallstones and alcohol.  The risks and benefits of the procedure including bleeding, infection, hepatobiliary injury, and the possibility of an open procedure were fully explained to the patient, who gave informed consent.  Procedure note: Patient was placed in supine position.  After induction of general endotracheal anesthesia, the abdomen was prepped and draped using the usual sterile technique with DuraPrep.  Surgical site confirmation was performed.  A supraumbilical incision was made down to the fascia.  A Veress needle was introduced into the abdominal cavity and confirmation of placement was done using the saline drop test.  The abdomen was then insufflated to 16mmHg pressure.  An 11 mm trocar was introduced into the abdominal cavity under direct visualization without difficulty.  The patient was placed in reverse Trendelenburg position and an additional 11 mm trocar was placed in the epigastric region and 5 mm trochars were placed in the right upper quadrant and right flank regions.  The liver was inspected and noted to be within normal limits.  The gallbladder was retracted in a dynamic fashion in order to provide a critical view of the triangle of Calot.  The cystic duct was first identified.  Its juncture to the infundibulum was fully identified.  Endoclips were placed proximally and distally on the cystic duct, and the cystic duct was divided.  This was likewise done to the cystic artery.  The gallbladder was freed away from the gallbladder fossa using Bovie electrocautery.  The gallbladder was delivered through the epigastric trocar site using an Endo Catch bag.   The gallbladder fossa was inspected and no abnormal bleeding or bile leakage was noted.  Surgicel was placed in the gallbladder fossa.  Arista was placed in the gallbladder fossa.  All fluid and air were then evacuated from the abdominal cavity prior to removal of the trochars.  All wounds were irrigated with normal saline.  All wounds were injected with 0.5% Sensorcaine.  The supraumbilical fascia as well as epigastric fascia were reapproximated using 0 Vicryl interrupted sutures.  All skin incisions were closed using staples.  Betadine ointment and dry sterile dressings were applied.  All tape and needle counts were correct at the end of the procedure.  Patient was extubated in the operating room and transferred to PACU in stable condition.  Complications: None  EBL: Minimal  Specimen: Gallbladder

## 2017-09-23 NOTE — Interval H&P Note (Signed)
History and Physical Interval Note:  09/23/2017 10:14 AM  Brady FabryKenneth L Rewis  has presented today for surgery, with the diagnosis of gallstone pancreatitis  The various methods of treatment have been discussed with the patient and family. After consideration of risks, benefits and other options for treatment, the patient has consented to  Procedure(s): LAPAROSCOPIC CHOLECYSTECTOMY (N/A) as a surgical intervention .  The patient's history has been reviewed, patient examined, no change in status, stable for surgery.  I have reviewed the patient's chart and labs.  Questions were answered to the patient's satisfaction.     Franky MachoMark Dashanna Kinnamon

## 2017-09-23 NOTE — Transfer of Care (Signed)
Immediate Anesthesia Transfer of Care Note  Patient: Brady Bell  Procedure(s) Performed: LAPAROSCOPIC CHOLECYSTECTOMY (N/A )  Patient Location: PACU  Anesthesia Type:General  Level of Consciousness: awake, alert , oriented and patient cooperative  Airway & Oxygen Therapy: Patient Spontanous Breathing  Post-op Assessment: Report given to RN and Post -op Vital signs reviewed and stable  Post vital signs: Reviewed and stable  Last Vitals:  Vitals Value Taken Time  BP 148/90 09/23/2017 12:01 PM  Temp    Pulse 68 09/23/2017 12:02 PM  Resp 14 09/23/2017 12:02 PM  SpO2 95 % 09/23/2017 12:02 PM  Vitals shown include unvalidated device data.  Last Pain:  Vitals:   09/23/17 0912  TempSrc: Oral  PainSc: 0-No pain         Complications: No apparent anesthesia complications

## 2017-09-24 ENCOUNTER — Encounter (HOSPITAL_COMMUNITY): Payer: Self-pay | Admitting: General Surgery

## 2017-09-29 ENCOUNTER — Ambulatory Visit: Payer: Self-pay | Admitting: General Surgery

## 2017-09-29 ENCOUNTER — Encounter: Payer: Self-pay | Admitting: General Surgery

## 2017-09-29 ENCOUNTER — Ambulatory Visit (INDEPENDENT_AMBULATORY_CARE_PROVIDER_SITE_OTHER): Payer: Self-pay | Admitting: General Surgery

## 2017-09-29 VITALS — BP 115/70 | HR 57 | Temp 97.1°F | Wt 141.0 lb

## 2017-09-29 DIAGNOSIS — Z09 Encounter for follow-up examination after completed treatment for conditions other than malignant neoplasm: Secondary | ICD-10-CM

## 2017-09-29 NOTE — Progress Notes (Signed)
Subjective:     Brady Bell  S/p lap cholecystectomy.  Doing well.  Mild incisional pain.  No fever, chills, jaundice. Objective:    BP 115/70 (BP Location: Left Arm, Patient Position: Sitting, Cuff Size: Normal)   Pulse (!) 57   Temp (!) 97.1 F (36.2 C) (Oral)   Wt 141 lb (64 kg)   BMI 20.23 kg/m   General:  alert, cooperative and no distress  Abdomen:  Soft, incisions healing well.  Staples removed, steristrips applied. Final path consistent with diagnosis     Assessment:    Doing well postoperatively.    Plan:  Gradually resume normal activitiy.  Follow up here prn.

## 2017-10-06 ENCOUNTER — Other Ambulatory Visit: Payer: Self-pay | Admitting: General Surgery

## 2017-10-06 MED ORDER — OXYCODONE-ACETAMINOPHEN 7.5-325 MG PO TABS: 1.0000 | ORAL_TABLET | Freq: Four times a day (QID) | ORAL | 0 refills | Status: DC | PRN

## 2017-11-11 ENCOUNTER — Ambulatory Visit (HOSPITAL_COMMUNITY)
Admission: RE | Admit: 2017-11-11 | Discharge: 2017-11-11 | Disposition: A | Discharge status: Home / Self Care | Payer: Medicaid Other | Source: Ambulatory Visit | Attending: Vascular Surgery | Admitting: Vascular Surgery

## 2017-11-11 ENCOUNTER — Ambulatory Visit: Payer: Medicaid Other | Admitting: Vascular Surgery

## 2017-11-11 ENCOUNTER — Ambulatory Visit (INDEPENDENT_AMBULATORY_CARE_PROVIDER_SITE_OTHER)
Admission: RE | Admit: 2017-11-11 | Discharge: 2017-11-11 | Disposition: A | Discharge status: Home / Self Care | Payer: Medicaid Other | Source: Ambulatory Visit | Attending: Vascular Surgery | Admitting: Vascular Surgery

## 2017-11-11 DIAGNOSIS — I70302 Unspecified atherosclerosis of unspecified type of bypass graft(s) of the extremities, left leg: Secondary | ICD-10-CM | POA: Diagnosis not present

## 2017-11-11 DIAGNOSIS — I739 Peripheral vascular disease, unspecified: Secondary | ICD-10-CM

## 2017-11-11 DIAGNOSIS — F17200 Nicotine dependence, unspecified, uncomplicated: Secondary | ICD-10-CM | POA: Insufficient documentation

## 2017-11-11 DIAGNOSIS — I779 Disorder of arteries and arterioles, unspecified: Secondary | ICD-10-CM | POA: Diagnosis not present

## 2017-11-11 DIAGNOSIS — I70212 Atherosclerosis of native arteries of extremities with intermittent claudication, left leg: Secondary | ICD-10-CM

## 2017-11-17 ENCOUNTER — Ambulatory Visit (INDEPENDENT_AMBULATORY_CARE_PROVIDER_SITE_OTHER): Payer: Medicaid Other | Admitting: Vascular Surgery

## 2017-11-17 ENCOUNTER — Encounter: Payer: Self-pay | Admitting: Vascular Surgery

## 2017-11-17 ENCOUNTER — Other Ambulatory Visit: Payer: Self-pay

## 2017-11-17 VITALS — BP 129/80 | HR 59 | Resp 20 | Ht 70.0 in | Wt 145.0 lb

## 2017-11-17 DIAGNOSIS — I739 Peripheral vascular disease, unspecified: Secondary | ICD-10-CM | POA: Diagnosis not present

## 2017-11-17 NOTE — Progress Notes (Signed)
Patient name: Brady FabryKenneth L Pandit MRN: [id number] DOB: 06/21/1955 Sex: male  REASON FOR VISIT: Follow-up for bypass graft surveillance  OP History: 1. L iliofem EA w/ BPA (04/28/17) 2. L CFA to AT bypass with NR ips GSV (09/06/11)  HPI: Brady Bell is a 62 y.o. male the presents for interval 1862-month follow-up of his left lower extremity bypass.  He initially underwent a left common femoral to AT bypass with non-reversed ipsilateral great saphenous vein in 2013 by Dr. Imogene Burnhen.  Most recently underwent left iliofemoral endarterectomy in January 2019.  His wound in his groin has healed nicely.  He reports no new issues.  His foot feels good.  He denies any rest pain or new tissue loss.  Unfortunately he is still smoking but is trying to cut back.  He denies any issues with his right leg at this time.  Past Medical History:  Diagnosis Date  . Chronic ankle pain   . Chronic knee pain   . GERD (gastroesophageal reflux disease)   . Hepatitis    Hep A in 1980's  . Hypoglycemia   . Migraines   . Peripheral vascular disease (HCC)   . Recurrent sinus infections   . Seizures (HCC)   . Shingles     Past Surgical History:  Procedure Laterality Date  . ABDOMINAL AORTOGRAM N/A 04/23/2017   Procedure: ABDOMINAL AORTOGRAM;  Surgeon: Fransisco Hertzhen, Brian L, MD;  [facility];  Service: Cardiovascular;  Laterality: N/A;  . CHOLECYSTECTOMY N/A 09/23/2017   Procedure: LAPAROSCOPIC CHOLECYSTECTOMY;  Surgeon: Franky MachoJenkins, Mark, MD;  Location: [facility];  Service: General;  Laterality: N/A;  . ENDARTERECTOMY FEMORAL Left 04/28/2017   Procedure: ENDARTERECTOMY LEFT ILIO-FEMORAL ARTERY;  Surgeon: Fransisco Hertzhen, Brian L, MD;  [facility];  Service: Vascular;  Laterality: Left;  . FASCIOTOMY  09/06/2011   Procedure: FASCIOTOMY;  Surgeon: Fransisco HertzBrian L Chen, MD;  [facility];  Service: Vascular;  Laterality: Left;  . FEMORAL-TIBIAL BYPASS GRAFT  09/06/2011   Procedure: BYPASS GRAFT FEMORAL-TIBIAL ARTERY;  Surgeon: Fransisco HertzBrian L Chen,  MD;  [facility];  Service: Vascular;  Laterality: Left;  . KNEE CARTILAGE SURGERY Left   . LOWER EXTREMITY ANGIOGRAM N/A 09/05/2011   Procedure: LOWER EXTREMITY ANGIOGRAM;  Surgeon: Sherren Kernsharles E Fields, MD;  [facility];  Service: Cardiovascular;  Laterality: N/A;  . LOWER EXTREMITY ANGIOGRAM Left 04/15/2012   Procedure: LOWER EXTREMITY ANGIOGRAM;  Surgeon: Fransisco HertzBrian L Chen, MD;  [facility];  Service: Cardiovascular;  Laterality: Left;  . LOWER EXTREMITY ANGIOGRAPHY Bilateral 04/23/2017   Procedure: Lower Extremity Angiography;  Surgeon: Fransisco Hertzhen, Brian L, MD;  [facility];  Service: Cardiovascular;  Laterality: Bilateral;  . PATCH ANGIOPLASTY Left 04/28/2017   Procedure: ILIO-FEMORAL ARTERY PATCH ANGIOPLASTY USING Livia SnellenXENOSURE BIOLOGIC PATCH;  Surgeon: Fransisco Hertzhen, Brian L, MD;  [facility];  Service: Vascular;  Laterality: Left;  . PR VEIN BYPASS GRAFT,AORTO-FEM-POP  09/06/11   Left   . TOOTH EXTRACTION  June-July-Aug. 2015   Several     Family History  Problem Relation Age of Onset  . Alcohol abuse Father   . Kidney failure Father   . Other Mother        "age"  . Diabetes Unknown     SOCIAL HISTORY: Social History   Tobacco Use  . Smoking status: Current Every Day Smoker    Packs/day: 0.50    Years: 30.00    Pack years: 15.00    Types: Cigarettes  .  Smokeless tobacco: Never Used  . Tobacco comment: pt states that he is trying his best to quit  Substance Use Topics  . Alcohol use: Yes    Alcohol/week: 0.0 standard drinks    Comment: Few drinks per day    No Known Allergies  Current Outpatient Medications  Medication Sig Dispense Refill  . aspirin EC 81 MG tablet Take 81 mg by mouth daily.    . cycloSPORINE (RESTASIS) 0.05 % ophthalmic emulsion Place 1 drop into both eyes 2 (two) times daily.    Marland Kitchen diltiazem (CARDIZEM CD) 120 MG 24 hr capsule Take 1 capsule (120 mg total) by mouth daily. 30 capsule 12  . divalproex (DEPAKOTE ER) 500 MG 24 hr tablet  Take 1 tablet (500 mg total) at bedtime by mouth. 90 tablet 4  . eletriptan (RELPAX) 40 MG tablet Take 40 mg by mouth every 2 (two) hours as needed for migraine or headache. May repeat in 2 hours if headache persists or recurs.     . fluticasone (FLONASE) 50 MCG/ACT nasal spray Place 2 sprays into both nostrils daily.    Marland Kitchen gabapentin (NEURONTIN) 300 MG capsule Take 300 mg by mouth daily.     . Multiple Vitamin (MULTIVITAMIN WITH MINERALS) TABS tablet Take 1 tablet by mouth daily.    . nicotine (NICODERM CQ - DOSED IN MG/24 HOURS) 21 mg/24hr patch Place 1 patch (21 mg total) onto the skin daily. 28 patch 0  . SUMAtriptan (IMITREX) 100 MG tablet Take 1 tablet (100 mg total) by mouth every 2 (two) hours as needed for migraine. May repeat in 2 hours if headache persists or recurs. 12 tablet 11  . escitalopram (LEXAPRO) 10 MG tablet Take 1 tablet (10 mg total) by mouth daily. (Patient not taking: Reported on 11/17/2017) 30 tablet 2  . folic acid (FOLVITE) 1 MG tablet Take 1 tablet (1 mg total) by mouth daily. (Patient not taking: Reported on 11/17/2017) 30 tablet 12  . oxyCODONE-acetaminophen (PERCOCET) 7.5-325 MG tablet Take 1 tablet by mouth every 6 (six) hours as needed. (Patient not taking: Reported on 11/17/2017) 25 tablet 0  . pantoprazole (PROTONIX) 40 MG tablet Take 1 tablet (40 mg total) by mouth daily before breakfast. (Patient not taking: Reported on 11/17/2017) 30 tablet 12   No current facility-administered medications for this visit.     REVIEW OF SYSTEMS:  [X]  denotes positive finding, [ ]  denotes negative finding Cardiac  Comments:  Chest pain or chest pressure:    Shortness of breath upon exertion:    Short of breath when lying flat:    Irregular heart rhythm:        Vascular    Pain in calf, thigh, or hip brought on by ambulation:    Pain in feet at night that wakes you up from your sleep:     Blood clot in your veins:    Leg swelling:         Pulmonary    Oxygen at home:      Productive cough:     Wheezing:         Neurologic    Sudden weakness in arms or legs:     Sudden numbness in arms or legs:     Sudden onset of difficulty speaking or slurred speech:    Temporary loss of vision in one eye:     Problems with dizziness:         Gastrointestinal    Blood in stool:  Vomited blood:         Genitourinary    Burning when urinating:     Blood in urine:        Psychiatric    Major depression:         Hematologic    Bleeding problems:    Problems with blood clotting too easily:        Skin    Rashes or ulcers:        Constitutional    Fever or chills:      PHYSICAL EXAM: Vitals:   11/17/17 1010  BP: 129/80  Pulse: (!) 59  Resp: 20  SpO2: 97%  Weight: 65.8 kg  Height: 5\' 10"  (1.778 m)    GENERAL: The patient is a well-nourished male, in no acute distress. The vital signs are documented above. CARDIAC: There is a regular rate and rhythm.  VASCULAR:  Well healed left groin scar. Palpable left femoral pulse Palpable pulse in graft along lateral left leg Palpable left DP pulse in foot No tissue loss in left foot, foot warm PULMONARY: No respiratory issues. ABDOMEN: Soft and non-tender with normal pitched bowel sounds.  MUSCULOSKELETAL: There are no major deformities or cyanosis. NEUROLOGIC: No focal weakness or paresthesias are detected. SKIN: There are no ulcers or rashes noted. PSYCHIATRIC: The patient has a normal affect.  DATA:   I have independently reviewed his noninvasive imaging with an ABI of 1.06 in the right leg and 0.97 on the left.  His bypass graft duplex shows a slightly increased velocity of 200 in the inflow on the left.  Rest of the bypass widely patent.    Assessment/Plan:  Overall Mr. Stamper seems to be doing very well.  He has a palpable pulse in his femoral to AT bypass that is tunneled subcutaneously and also has a palpable pulse in his foot.  He has no new symptoms at this time.  In reviewing his  noninvasive imaging his ABIs are relatively unchanged.  He does have a slightly increased velocity in the inflow artery on the left after revision earlier this year.  Overall very encouraged with his exam today but have recommended to see him again in 3 months just to ensure that his velocities are stable.  After that we will probably go to six-month imaging unless he develops issues sooner.   Cephus Shellinghristopher J. Griselda Tosh, MD [facility] Office: [phone] Pager: [phone]  Cephus Shellinghristopher J Jonel Sick

## 2017-12-02 ENCOUNTER — Other Ambulatory Visit: Payer: Self-pay

## 2017-12-02 DIAGNOSIS — Z95828 Presence of other vascular implants and grafts: Secondary | ICD-10-CM

## 2017-12-02 DIAGNOSIS — I739 Peripheral vascular disease, unspecified: Secondary | ICD-10-CM

## 2017-12-02 DIAGNOSIS — I779 Disorder of arteries and arterioles, unspecified: Secondary | ICD-10-CM

## 2018-02-19 ENCOUNTER — Telehealth: Payer: Self-pay | Admitting: Acute Care

## 2018-02-22 ENCOUNTER — Other Ambulatory Visit: Payer: Self-pay | Admitting: Neurology

## 2018-02-22 NOTE — Telephone Encounter (Signed)
Called and spoke with pt. [facility] with SG.  I stated to pt that I would send this to Angel[facility]Denise so she can call him to get the visit scheduled for him with SG. Pt expressed understanding.  Routing to [facility].

## 2018-02-22 NOTE — Telephone Encounter (Signed)
Spoke with pt regarding lung cancer screening.  Pt advised that Medicaid does not cover this.  Of[facility] but pt will have trouble with transportation.  Pt also [facility] of [facility].  Pt would like me to call him back after the first of the year.  Will defer until that time. Will close this message and refer to referral notes.

## 2018-02-22 NOTE — Telephone Encounter (Signed)
LMTC x 1  

## 2018-02-23 ENCOUNTER — Ambulatory Visit: Payer: Medicaid Other | Admitting: Vascular Surgery

## 2018-02-23 ENCOUNTER — Encounter (HOSPITAL_COMMUNITY): Payer: Medicaid Other

## 2018-02-23 ENCOUNTER — Other Ambulatory Visit (HOSPITAL_COMMUNITY): Payer: Medicaid Other

## 2018-03-02 ENCOUNTER — Encounter (HOSPITAL_COMMUNITY): Payer: Medicaid Other

## 2018-03-02 ENCOUNTER — Other Ambulatory Visit (HOSPITAL_COMMUNITY): Payer: Medicaid Other

## 2018-03-02 ENCOUNTER — Ambulatory Visit: Payer: Medicaid Other | Admitting: Vascular Surgery

## 2018-03-05 ENCOUNTER — Other Ambulatory Visit (HOSPITAL_COMMUNITY): Payer: Medicaid Other

## 2018-03-11 ENCOUNTER — Inpatient Hospital Stay (HOSPITAL_COMMUNITY): Payer: Medicaid Other | Attending: Hematology

## 2018-03-11 DIAGNOSIS — D751 Secondary polycythemia: Secondary | ICD-10-CM | POA: Diagnosis not present

## 2018-03-11 LAB — CBC WITH DIFFERENTIAL/PLATELET
Abs Immature Granulocytes: 0.02 10*3/uL (ref 0.00–0.07)
Basophils Absolute: 0.1 10*3/uL (ref 0.0–0.1)
Basophils Relative: 1 %
Eosinophils Absolute: 0.3 10*3/uL (ref 0.0–0.5)
Eosinophils Relative: 4 %
HEMATOCRIT: 41.6 % (ref 39.0–52.0)
Hemoglobin: 13.4 g/dL (ref 13.0–17.0)
Immature Granulocytes: 0 %
Lymphocytes Relative: 27 %
Lymphs Abs: 1.7 10*3/uL (ref 0.7–4.0)
MCH: 32.5 pg (ref 26.0–34.0)
MCHC: 32.2 g/dL (ref 30.0–36.0)
MCV: 101 fL — ABNORMAL HIGH (ref 80.0–100.0)
MONOS PCT: 13 %
Monocytes Absolute: 0.8 10*3/uL (ref 0.1–1.0)
Neutro Abs: 3.6 10*3/uL (ref 1.7–7.7)
Neutrophils Relative %: 55 %
Platelets: 255 10*3/uL (ref 150–400)
RBC: 4.12 MIL/uL — ABNORMAL LOW (ref 4.22–5.81)
RDW: 13.2 % (ref 11.5–15.5)
WBC: 6.5 10*3/uL (ref 4.0–10.5)
nRBC: 0 % (ref 0.0–0.2)

## 2018-03-11 LAB — COMPREHENSIVE METABOLIC PANEL
ALT: 13 U/L (ref 0–44)
AST: 24 U/L (ref 15–41)
Albumin: 3.6 g/dL (ref 3.5–5.0)
Alkaline Phosphatase: 69 U/L (ref 38–126)
Anion gap: 9 (ref 5–15)
BUN: 6 mg/dL (ref 6–20)
CHLORIDE: 98 mmol/L (ref 98–111)
CO2: 28 mmol/L (ref 22–32)
Calcium: 9.1 mg/dL (ref 8.9–10.3)
Creatinine, Ser: 0.82 mg/dL (ref 0.61–1.24)
GFR calc Af Amer: >60 mL/min (ref >60)
GFR calc non Af Amer: >60 mL/min (ref >60)
Glucose, Bld: 99 mg/dL (ref 70–99)
Potassium: 4.4 mmol/L (ref 3.5–5.1)
Sodium: 135 mmol/L (ref 135–145)
Total Bilirubin: 0.5 mg/dL (ref 0.3–1.2)
Total Protein: 7.1 g/dL (ref 6.5–8.1)

## 2018-03-12 ENCOUNTER — Other Ambulatory Visit: Payer: Self-pay

## 2018-03-12 ENCOUNTER — Inpatient Hospital Stay (HOSPITAL_COMMUNITY): Payer: Medicaid Other | Attending: Hematology | Admitting: Hematology

## 2018-03-12 ENCOUNTER — Encounter (HOSPITAL_COMMUNITY): Payer: Self-pay | Admitting: Hematology

## 2018-03-12 VITALS — BP 110/69 | HR 79 | Temp 97.7°F | Resp 16 | Wt 147.3 lb

## 2018-03-12 DIAGNOSIS — D751 Secondary polycythemia: Secondary | ICD-10-CM | POA: Insufficient documentation

## 2018-03-12 DIAGNOSIS — I739 Peripheral vascular disease, unspecified: Secondary | ICD-10-CM | POA: Insufficient documentation

## 2018-03-12 DIAGNOSIS — K219 Gastro-esophageal reflux disease without esophagitis: Secondary | ICD-10-CM

## 2018-03-12 DIAGNOSIS — F1290 Cannabis use, unspecified, uncomplicated: Secondary | ICD-10-CM

## 2018-03-12 DIAGNOSIS — G479 Sleep disorder, unspecified: Secondary | ICD-10-CM | POA: Diagnosis not present

## 2018-03-12 DIAGNOSIS — Z7982 Long term (current) use of aspirin: Secondary | ICD-10-CM | POA: Diagnosis not present

## 2018-03-12 DIAGNOSIS — Z79899 Other long term (current) drug therapy: Secondary | ICD-10-CM | POA: Insufficient documentation

## 2018-03-12 DIAGNOSIS — F17210 Nicotine dependence, cigarettes, uncomplicated: Secondary | ICD-10-CM | POA: Insufficient documentation

## 2018-03-12 NOTE — Progress Notes (Signed)
[facility] [place], [place] [address]   CLINIC:  Medical Oncology/Hematology  PCP:  Sinda Du, [place] [address] [phone]   REASON FOR VISIT: Follow-up for Secondary erythrocytosis  CURRENT THERAPY: Intermittent phlebotomies.    INTERVAL HISTORY:  Mr. Treece 62 y.o. male returns for routine follow-up Secondary erythrocytosis. He is doing well since his last visit. He does report having the flu 2 weeks after receiving his flu shot this year. He was also in the hospital for pancreatitis and he had his galbadder removed. Denies any fevers, night sweats, chills, or wt loss. Denies any new pains. Denies any nausea, vomiting, or diarrhea. Denies any recent infections. He reports his appetite at 100% and his energy level at 75%. He lives at home and performs all his own ADLs and activities.    REVIEW OF SYSTEMS:  Review of Systems  Psychiatric/Behavioral: Positive for sleep disturbance.  All other systems reviewed and are negative.    PAST MEDICAL/SURGICAL HISTORY:  Past Medical History:  Diagnosis Date  . Chronic ankle pain   . Chronic knee pain   . GERD (gastroesophageal reflux disease)   . Hepatitis    Hep A in 1980's  . Hypoglycemia   . Migraines   . Peripheral vascular disease ([place])   . Recurrent sinus infections   . Seizures ([place])   . Shingles    Past Surgical History:  Procedure Laterality Date  . ABDOMINAL AORTOGRAM N/A 04/23/2017   Procedure: ABDOMINAL AORTOGRAM;  Surgeon: Conrad Assaria, MD;  Location: [facility];  Service: Cardiovascular;  Laterality: N/A;  . CHOLECYSTECTOMY N/A 09/23/2017   Procedure: LAPAROSCOPIC CHOLECYSTECTOMY;  Surgeon: Aviva Signs, MD;  Location: [facility];  Service: General;  Laterality: N/A;  . ENDARTERECTOMY FEMORAL Left 04/28/2017   Procedure: ENDARTERECTOMY LEFT ILIO-FEMORAL ARTERY;  Surgeon: Conrad Plush, MD;  Location: [facility];  Service: Vascular;  Laterality: Left;    . FASCIOTOMY  09/06/2011   Procedure: FASCIOTOMY;  Surgeon: Conrad [place], MD;  Location: [facility];  Service: Vascular;  Laterality: Left;  . FEMORAL-TIBIAL BYPASS GRAFT  09/06/2011   Procedure: BYPASS GRAFT FEMORAL-TIBIAL ARTERY;  Surgeon: Conrad Redfield, MD;  Location: [facility];  Service: Vascular;  Laterality: Left;  . KNEE CARTILAGE SURGERY Left   . LOWER EXTREMITY ANGIOGRAM N/A 09/05/2011   Procedure: LOWER EXTREMITY ANGIOGRAM;  Surgeon: Elam Dutch, MD;  Location: [facility];  Service: Cardiovascular;  Laterality: N/A;  . LOWER EXTREMITY ANGIOGRAM Left 04/15/2012   Procedure: LOWER EXTREMITY ANGIOGRAM;  Surgeon: Conrad [place], MD;  Location: [facility];  Service: Cardiovascular;  Laterality: Left;  . LOWER EXTREMITY ANGIOGRAPHY Bilateral 04/23/2017   Procedure: Lower Extremity Angiography;  Surgeon: Conrad Plymouth, MD;  Location: [facility];  Service: Cardiovascular;  Laterality: Bilateral;  . PATCH ANGIOPLASTY Left 04/28/2017   Procedure: ILIO-FEMORAL ARTERY PATCH ANGIOPLASTY USING Rueben Bash BIOLOGIC PATCH;  Surgeon: Conrad Belcourt, MD;  Location: [facility];  Service: Vascular;  Laterality: Left;  . PR VEIN BYPASS GRAFT,AORTO-FEM-POP  09/06/11   Left   . TOOTH EXTRACTION  June-July-Aug. 2015   Several      SOCIAL HISTORY:  Social History   Socioeconomic History  . Marital status: Single    Spouse name: Not on file  . Number of children: 0  . Years of education: some college  . Highest education level: Not on file  Occupational History  . Occupation: Disabled  Social Needs  . Financial resource strain: Not on file  . Food insecurity:    Worry: Not on file    Inability: Not on file  . Transportation needs:    Medical: Not on file    Non-medical: Not on file  Tobacco Use  . Smoking status: Current Every Day Smoker    Packs/day: 0.50    Years: 30.00    Pack years: 15.00    Types: Cigarettes  . Smokeless tobacco: Never Used  . Tobacco comment: pt states that he is trying his best  to quit  Substance and Sexual Activity  . Alcohol use: Yes    Alcohol/week: 0.0 standard drinks    Comment: Few drinks per day  . Drug use: Yes    Types: Marijuana    Comment: occasionally  . Sexual activity: Yes  Lifestyle  . Physical activity:    Days per week: Not on file    Minutes per session: Not on file  . Stress: Not on file  Relationships  . Social connections:    Talks on phone: Not on file    Gets together: Not on file    Attends religious service: Not on file    Active member of club or organization: Not on file    Attends meetings of clubs or organizations: Not on file    Relationship status: Not on file  . Intimate partner violence:    Fear of current or ex partner: Not on file    Emotionally abused: Not on file    Physically abused: Not on file    Forced sexual activity: Not on file  Other Topics Concern  . Not on file  Social History Narrative   Lives alone.   Left-handed.   2-3 cups caffeine per day.    FAMILY HISTORY:  Family History  Problem Relation Age of Onset  . Alcohol abuse Father   . Kidney failure Father   . Other Mother        "age"  . Diabetes Other     CURRENT MEDICATIONS:  Outpatient Encounter Medications as of 03/12/2018  Medication Sig Note  . aspirin EC 81 MG tablet Take 81 mg by mouth daily.   . cycloSPORINE (RESTASIS) 0.05 % ophthalmic emulsion Place 1 drop into both eyes 2 (two) times daily.   Marland Kitchen diltiazem (CARDIZEM CD) 120 MG 24 hr capsule Take 1 capsule (120 mg total) by mouth daily.   . divalproex (DEPAKOTE ER) 500 MG 24 hr tablet Take one tab at bedtime.  Please call [phone] to schedule yearly follow up.   . eletriptan (RELPAX) 40 MG tablet Take 40 mg by mouth every 2 (two) hours as needed for migraine or headache. May repeat in 2 hours if headache persists or recurs.    . fluticasone (FLONASE) 50 MCG/ACT nasal spray Place 2 sprays into both nostrils daily.   Marland Kitchen gabapentin (NEURONTIN) 300 MG capsule Take 300 mg by mouth  daily.    . Multiple Vitamin (MULTIVITAMIN WITH MINERALS) TABS tablet Take 1 tablet by mouth daily.   . nicotine (NICODERM CQ - DOSED IN MG/24 HOURS) 21 mg/24hr patch Place 1 patch (21 mg total) onto the skin daily.   . pantoprazole (PROTONIX) 40 MG tablet Take 1 tablet (40 mg total) by mouth daily before breakfast.   . SUMAtriptan (IMITREX) 100 MG tablet Take 1 tablet (100 mg total) by mouth every 2 (two) hours as needed for migraine. May repeat in 2 hours if headache persists  or recurs.   . traZODone (DESYREL) 50 MG tablet Take 50 mg by mouth at bedtime.   . [DISCONTINUED] escitalopram (LEXAPRO) 10 MG tablet Take 1 tablet (10 mg total) by mouth daily.   . [DISCONTINUED] folic acid (FOLVITE) 1 MG tablet Take 1 tablet (1 mg total) by mouth daily. (Patient not taking: Reported on 11/17/2017) 09/17/2017: Pt has one last months worth of this med and will stop taking after he runs out   . [DISCONTINUED] oxyCODONE-acetaminophen (PERCOCET) 7.5-325 MG tablet Take 1 tablet by mouth every 6 (six) hours as needed. (Patient not taking: Reported on 11/17/2017)    No facility-administered encounter medications on file as of 03/12/2018.     ALLERGIES:  No Known Allergies   PHYSICAL EXAM:  ECOG Performance status: 1  Vitals:   03/12/18 1457  BP: 110/69  Pulse: 79  Resp: 16  Temp: 97.7 F (36.5 C)  SpO2: 98%   Filed Weights   03/12/18 1457  Weight: 147 lb 4.8 oz (66.8 kg)    Physical Exam Constitutional:      Appearance: Normal appearance. He is normal weight.  Musculoskeletal: Normal range of motion.  Skin:    General: Skin is warm and dry.  Neurological:     Mental Status: He is alert and oriented to person, place, and time. Mental status is at baseline.  Psychiatric:        Mood and Affect: Mood normal.        Behavior: Behavior normal.        Thought Content: Thought content normal.        Judgment: Judgment normal.      LABORATORY DATA:  I have reviewed the labs as listed.   CBC    Component Value Date/Time   WBC 6.5 03/11/2018 1346   RBC 4.12 (L) 03/11/2018 1346   HGB 13.4 03/11/2018 1346   HGB 20.2 (HH) 12/08/2016 1049   HCT 41.6 03/11/2018 1346   HCT 58.3 (H) 12/08/2016 1049   PLT 255 03/11/2018 1346   PLT 168 12/08/2016 1049   MCV 101.0 (H) 03/11/2018 1346   MCV 101 (H) 12/08/2016 1049   MCH 32.5 03/11/2018 1346   MCHC 32.2 03/11/2018 1346   RDW 13.2 03/11/2018 1346   RDW 14.2 12/08/2016 1049   LYMPHSABS 1.7 03/11/2018 1346   MONOABS 0.8 03/11/2018 1346   EOSABS 0.3 03/11/2018 1346   BASOSABS 0.1 03/11/2018 1346   CMP Latest Ref Rng & Units 03/11/2018 09/03/2017 08/02/2017  Glucose 70 - 99 mg/dL 99 105(H) 82  BUN 6 - 20 mg/dL 6 9 <5(L)  Creatinine 0.61 - 1.24 mg/dL 0.82 0.77 0.78  Sodium 135 - 145 mmol/L 135 131(L) 131(L)  Potassium 3.5 - 5.1 mmol/L 4.4 4.6 4.1  Chloride 98 - 111 mmol/L 98 93(L) 95(L)  CO2 22 - 32 mmol/L 28 29 29   Calcium 8.9 - 10.3 mg/dL 9.1 9.7 8.6(L)  Total Protein 6.5 - 8.1 g/dL 7.1 7.7 -  Total Bilirubin 0.3 - 1.2 mg/dL 0.5 1.1 -  Alkaline Phos 38 - 126 U/L 69 68 -  AST 15 - 41 U/L 24 18 -  ALT 0 - 44 U/L 13 10(L) -       DIAGNOSTIC IMAGING:  I have independently reviewed the scans and discussed with the patient.   I have reviewed Francene Finders, NP's note and agree with the documentation.  I personally performed a face-to-face visit, made revisions and my assessment and plan is as follows.  ASSESSMENT & PLAN:   Erythrocytosis 1.  Secondary erythrocytosis: - Evaluation including Jak 2,CALR, exon 12-15, MPL W mutations negative.  EPO level of 4.2.  Bone marrow biopsy was not done. -Last phlebotomy was in December 2018. - Patient had an episode of pancreatitis in May.  He underwent laparoscopic cholecystectomy on 09/23/2017. - Since the last hospitalization, he has cut back on smoking.  He is smoking 1 pack of cigarettes a week. - We reviewed his blood work.  CBC and hemoglobin was within normal limits.  MCV  has gone slightly high at 101.  LFTs and creatinine were within normal limits. - We will see him back in 6 months and check his counts.      Orders placed this encounter:  Orders Placed This Encounter  Procedures  . CBC with Differential/Platelet  . Comprehensive metabolic panel      Derek Jack, MD [place] [phone]

## 2018-03-12 NOTE — Patient Instructions (Signed)
[facility] at [facility] Discharge Instructions  Follow up in 6 months with labs    Thank you for choosing [facility] at [facility] to provide your oncology and hematology care.  To afford each patient quality time with our provider, please arrive at least 15 minutes before your scheduled appointment time.   If you have a lab appointment with the Cancer Center please come in thru the  Main Entrance and check in at the main information desk  You need to re-schedule your appointment should you arrive 10 or more minutes late.  We strive to give you quality time with our providers, and arriving late affects you and other patients whose appointments are after yours.  Also, if you no show three or more times for appointments you may be dismissed from the clinic at the providers discretion.     Again, thank you for choosing [facility].  Our hope is that these requests will decrease the amount of time that you wait before being seen by our physicians.       _____________________________________________________________  Should you have questions after your visit to [facility], please contact our office at [phone] between the hours of 8:00 a.m. and 4:30 p.m.  Voicemails left after 4:00 p.m. will not be returned until the following business day.  For prescription refill requests, have your pharmacy contact our office and allow 72 hours.    Cancer Center Support Programs:   > Cancer Support Group  2nd Tuesday of the month 1pm-2pm, Journey Room    

## 2018-03-12 NOTE — Assessment & Plan Note (Signed)
1.  Secondary erythrocytosis: - Evaluation including Jak 2,CALR, exon 12-15, MPL W mutations negative.  EPO level of 4.2.  Bone marrow biopsy was not done. -Last phlebotomy was in December 2018. - Patient had an episode of pancreatitis in May.  He underwent laparoscopic cholecystectomy on 09/23/2017. - Since the last hospitalization, he has cut back on smoking.  He is smoking 1 pack of cigarettes a week. - We reviewed his blood work.  CBC and hemoglobin was within normal limits.  MCV has gone slightly high at 101.  LFTs and creatinine were within normal limits. - We will see him back in 6 months and check his counts.

## 2018-04-12 ENCOUNTER — Encounter (HOSPITAL_COMMUNITY): Payer: Self-pay | Admitting: Emergency Medicine

## 2018-04-12 ENCOUNTER — Inpatient Hospital Stay (HOSPITAL_COMMUNITY)
Admission: EM | Admit: 2018-04-12 | Discharge: 2018-04-16 | DRG: 439 | Disposition: A | Discharge status: Home / Self Care | Payer: Medicaid Other | Attending: Pulmonary Disease | Admitting: Pulmonary Disease

## 2018-04-12 ENCOUNTER — Other Ambulatory Visit: Payer: Self-pay

## 2018-04-12 ENCOUNTER — Emergency Department (HOSPITAL_COMMUNITY): Payer: Medicaid Other

## 2018-04-12 DIAGNOSIS — Z7951 Long term (current) use of inhaled steroids: Secondary | ICD-10-CM

## 2018-04-12 DIAGNOSIS — Z8669 Personal history of other diseases of the nervous system and sense organs: Secondary | ICD-10-CM

## 2018-04-12 DIAGNOSIS — F17210 Nicotine dependence, cigarettes, uncomplicated: Secondary | ICD-10-CM | POA: Diagnosis present

## 2018-04-12 DIAGNOSIS — I739 Peripheral vascular disease, unspecified: Secondary | ICD-10-CM | POA: Diagnosis present

## 2018-04-12 DIAGNOSIS — G43909 Migraine, unspecified, not intractable, without status migrainosus: Secondary | ICD-10-CM | POA: Diagnosis present

## 2018-04-12 DIAGNOSIS — K859 Acute pancreatitis without necrosis or infection, unspecified: Principal | ICD-10-CM | POA: Diagnosis present

## 2018-04-12 DIAGNOSIS — I4892 Unspecified atrial flutter: Secondary | ICD-10-CM | POA: Diagnosis present

## 2018-04-12 DIAGNOSIS — Z811 Family history of alcohol abuse and dependence: Secondary | ICD-10-CM

## 2018-04-12 DIAGNOSIS — Z7982 Long term (current) use of aspirin: Secondary | ICD-10-CM | POA: Diagnosis not present

## 2018-04-12 DIAGNOSIS — F10239 Alcohol dependence with withdrawal, unspecified: Secondary | ICD-10-CM | POA: Diagnosis present

## 2018-04-12 DIAGNOSIS — K852 Alcohol induced acute pancreatitis without necrosis or infection: Secondary | ICD-10-CM

## 2018-04-12 DIAGNOSIS — I10 Essential (primary) hypertension: Secondary | ICD-10-CM | POA: Diagnosis present

## 2018-04-12 DIAGNOSIS — K219 Gastro-esophageal reflux disease without esophagitis: Secondary | ICD-10-CM | POA: Diagnosis present

## 2018-04-12 DIAGNOSIS — G40909 Epilepsy, unspecified, not intractable, without status epilepticus: Secondary | ICD-10-CM | POA: Diagnosis present

## 2018-04-12 DIAGNOSIS — Z8679 Personal history of other diseases of the circulatory system: Secondary | ICD-10-CM

## 2018-04-12 DIAGNOSIS — E871 Hypo-osmolality and hyponatremia: Secondary | ICD-10-CM | POA: Diagnosis present

## 2018-04-12 DIAGNOSIS — R1010 Upper abdominal pain, unspecified: Secondary | ICD-10-CM | POA: Diagnosis present

## 2018-04-12 DIAGNOSIS — F1010 Alcohol abuse, uncomplicated: Secondary | ICD-10-CM | POA: Diagnosis not present

## 2018-04-12 DIAGNOSIS — I471 Supraventricular tachycardia: Secondary | ICD-10-CM | POA: Diagnosis not present

## 2018-04-12 DIAGNOSIS — Z720 Tobacco use: Secondary | ICD-10-CM | POA: Diagnosis not present

## 2018-04-12 DIAGNOSIS — I480 Paroxysmal atrial fibrillation: Secondary | ICD-10-CM | POA: Diagnosis present

## 2018-04-12 DIAGNOSIS — F419 Anxiety disorder, unspecified: Secondary | ICD-10-CM | POA: Diagnosis present

## 2018-04-12 DIAGNOSIS — Z9049 Acquired absence of other specified parts of digestive tract: Secondary | ICD-10-CM | POA: Diagnosis not present

## 2018-04-12 DIAGNOSIS — Z841 Family history of disorders of kidney and ureter: Secondary | ICD-10-CM

## 2018-04-12 HISTORY — DX: Personal history of other diseases of the circulatory system: Z86.79

## 2018-04-12 HISTORY — DX: Acute pancreatitis without necrosis or infection, unspecified: K85.90

## 2018-04-12 LAB — CBC WITH DIFFERENTIAL/PLATELET
Abs Immature Granulocytes: 0.04 10*3/uL (ref 0.00–0.07)
Basophils Absolute: 0.1 10*3/uL (ref 0.0–0.1)
Basophils Relative: 1 %
Eosinophils Absolute: 0.1 10*3/uL (ref 0.0–0.5)
Eosinophils Relative: 1 %
HCT: 47.3 % (ref 39.0–52.0)
Hemoglobin: 16.1 g/dL (ref 13.0–17.0)
Immature Granulocytes: 0 %
Lymphocytes Relative: 10 %
Lymphs Abs: 1.1 10*3/uL (ref 0.7–4.0)
MCH: 33.2 pg (ref 26.0–34.0)
MCHC: 34 g/dL (ref 30.0–36.0)
MCV: 97.5 fL (ref 80.0–100.0)
Monocytes Absolute: 0.9 10*3/uL (ref 0.1–1.0)
Monocytes Relative: 8 %
Neutro Abs: 9.1 10*3/uL — ABNORMAL HIGH (ref 1.7–7.7)
Neutrophils Relative %: 80 %
Platelets: 184 10*3/uL (ref 150–400)
RBC: 4.85 MIL/uL (ref 4.22–5.81)
RDW: 12.3 % (ref 11.5–15.5)
WBC: 11.3 10*3/uL — ABNORMAL HIGH (ref 4.0–10.5)
nRBC: 0 % (ref 0.0–0.2)

## 2018-04-12 LAB — COMPREHENSIVE METABOLIC PANEL
ALT: 17 U/L (ref 0–44)
AST: 24 U/L (ref 15–41)
Albumin: 4 g/dL (ref 3.5–5.0)
Alkaline Phosphatase: 93 U/L (ref 38–126)
Anion gap: 12 (ref 5–15)
BUN: 11 mg/dL (ref 6–20)
CHLORIDE: 97 mmol/L — AB (ref 98–111)
CO2: 22 mmol/L (ref 22–32)
Calcium: 9 mg/dL (ref 8.9–10.3)
Creatinine, Ser: 0.76 mg/dL (ref 0.61–1.24)
GFR calc Af Amer: >60 mL/min (ref >60)
GFR calc non Af Amer: >60 mL/min (ref >60)
Glucose, Bld: 118 mg/dL — ABNORMAL HIGH (ref 70–99)
Potassium: 4 mmol/L (ref 3.5–5.1)
Sodium: 131 mmol/L — ABNORMAL LOW (ref 135–145)
Total Bilirubin: 1.1 mg/dL (ref 0.3–1.2)
Total Protein: 7.3 g/dL (ref 6.5–8.1)

## 2018-04-12 LAB — PHOSPHORUS: Phosphorus: 3.3 mg/dL (ref 2.5–4.6)

## 2018-04-12 LAB — TSH: TSH: 2.187 u[IU]/mL (ref 0.350–4.500)

## 2018-04-12 LAB — MAGNESIUM: Magnesium: 1.8 mg/dL (ref 1.7–2.4)

## 2018-04-12 LAB — LIPASE, BLOOD: Lipase: 319 U/L — ABNORMAL HIGH (ref 11–51)

## 2018-04-12 MED ORDER — FLUTICASONE PROPIONATE 50 MCG/ACT NA SUSP
2.0000 | Freq: Every day | NASAL | Status: DC
  Administered 2018-04-12 – 2018-04-16 (×5): 2 via NASAL
  Filled 2018-04-12 (×2): qty 16

## 2018-04-12 MED ORDER — SODIUM CHLORIDE 0.9% FLUSH
3.0000 mL | Freq: Two times a day (BID) | INTRAVENOUS | Status: DC
  Administered 2018-04-13 – 2018-04-15 (×4): 3 mL via INTRAVENOUS

## 2018-04-12 MED ORDER — HYDROMORPHONE HCL 1 MG/ML IJ SOLN
1.0000 mg | Freq: Once | INTRAMUSCULAR | Status: AC
  Administered 2018-04-12: 1 mg via INTRAVENOUS
  Filled 2018-04-12: qty 1

## 2018-04-12 MED ORDER — TRAZODONE HCL 50 MG PO TABS
50.0000 mg | ORAL_TABLET | Freq: Every day | ORAL | Status: DC
  Administered 2018-04-12 – 2018-04-15 (×4): 50 mg via ORAL
  Filled 2018-04-12 (×4): qty 1

## 2018-04-12 MED ORDER — PANTOPRAZOLE SODIUM 40 MG PO TBEC
40.0000 mg | DELAYED_RELEASE_TABLET | Freq: Every day | ORAL | Status: DC
  Administered 2018-04-13 – 2018-04-16 (×4): 40 mg via ORAL
  Filled 2018-04-12 (×5): qty 1

## 2018-04-12 MED ORDER — GABAPENTIN 300 MG PO CAPS
300.0000 mg | ORAL_CAPSULE | Freq: Every day | ORAL | Status: DC
  Administered 2018-04-12 – 2018-04-16 (×5): 300 mg via ORAL
  Filled 2018-04-12 (×3): qty 1
  Filled 2018-04-12: qty 3
  Filled 2018-04-12: qty 1

## 2018-04-12 MED ORDER — HEPARIN SODIUM (PORCINE) 5000 UNIT/ML IJ SOLN
5000.0000 [IU] | Freq: Three times a day (TID) | INTRAMUSCULAR | Status: DC
  Administered 2018-04-12 – 2018-04-16 (×11): 5000 [IU] via SUBCUTANEOUS
  Filled 2018-04-12 (×11): qty 1

## 2018-04-12 MED ORDER — LORAZEPAM 2 MG/ML IJ SOLN
1.0000 mg | Freq: Four times a day (QID) | INTRAMUSCULAR | Status: AC | PRN
  Filled 2018-04-12 (×2): qty 1

## 2018-04-12 MED ORDER — ONDANSETRON HCL 4 MG/2ML IJ SOLN
4.0000 mg | Freq: Four times a day (QID) | INTRAMUSCULAR | Status: DC | PRN
  Administered 2018-04-12 – 2018-04-13 (×2): 4 mg via INTRAVENOUS
  Filled 2018-04-12 (×2): qty 2

## 2018-04-12 MED ORDER — ACETAMINOPHEN 325 MG PO TABS: 650.0000 mg | ORAL_TABLET | Freq: Four times a day (QID) | ORAL | Status: DC | PRN

## 2018-04-12 MED ORDER — THIAMINE HCL 100 MG/ML IJ SOLN
100.0000 mg | Freq: Every day | INTRAMUSCULAR | Status: DC
  Administered 2018-04-12 – 2018-04-15 (×4): 100 mg via INTRAVENOUS
  Filled 2018-04-12 (×4): qty 2

## 2018-04-12 MED ORDER — LORAZEPAM 1 MG PO TABS
1.0000 mg | ORAL_TABLET | Freq: Four times a day (QID) | ORAL | Status: AC | PRN
  Administered 2018-04-13 – 2018-04-15 (×3): 1 mg via ORAL
  Filled 2018-04-12 (×4): qty 1

## 2018-04-12 MED ORDER — IOHEXOL 300 MG/ML  SOLN
100.0000 mL | Freq: Once | INTRAMUSCULAR | Status: AC | PRN
  Administered 2018-04-12: 100 mL via INTRAVENOUS

## 2018-04-12 MED ORDER — FOLIC ACID 5 MG/ML IJ SOLN
1.0000 mg | Freq: Every day | INTRAMUSCULAR | Status: DC
  Administered 2018-04-12 – 2018-04-15 (×3): 1 mg via INTRAVENOUS
  Filled 2018-04-12 (×5): qty 0.2

## 2018-04-12 MED ORDER — HYDRALAZINE HCL 20 MG/ML IJ SOLN
10.0000 mg | Freq: Three times a day (TID) | INTRAMUSCULAR | Status: DC | PRN
  Administered 2018-04-12: 10 mg via INTRAVENOUS
  Filled 2018-04-12: qty 1

## 2018-04-12 MED ORDER — DOCUSATE SODIUM 100 MG PO CAPS
100.0000 mg | ORAL_CAPSULE | Freq: Two times a day (BID) | ORAL | Status: DC
  Administered 2018-04-13 – 2018-04-16 (×6): 100 mg via ORAL
  Filled 2018-04-12 (×7): qty 1

## 2018-04-12 MED ORDER — ASPIRIN EC 81 MG PO TBEC
81.0000 mg | DELAYED_RELEASE_TABLET | Freq: Every day | ORAL | Status: DC
  Administered 2018-04-12 – 2018-04-16 (×5): 81 mg via ORAL
  Filled 2018-04-12 (×5): qty 1

## 2018-04-12 MED ORDER — DIVALPROEX SODIUM ER 500 MG PO TB24
500.0000 mg | ORAL_TABLET | Freq: Every day | ORAL | Status: DC
  Administered 2018-04-12 – 2018-04-15 (×4): 500 mg via ORAL
  Filled 2018-04-12 (×5): qty 1

## 2018-04-12 MED ORDER — SODIUM CHLORIDE 0.9 % IV SOLN: 250.0000 mL | INTRAVENOUS | Status: DC | PRN

## 2018-04-12 MED ORDER — DILTIAZEM HCL ER COATED BEADS 120 MG PO CP24
120.0000 mg | ORAL_CAPSULE | Freq: Every day | ORAL | Status: DC
  Administered 2018-04-12 – 2018-04-16 (×5): 120 mg via ORAL
  Filled 2018-04-12 (×5): qty 1

## 2018-04-12 MED ORDER — SODIUM CHLORIDE 0.9% FLUSH: 3.0000 mL | INTRAVENOUS | Status: DC | PRN

## 2018-04-12 MED ORDER — CYCLOSPORINE 0.05 % OP EMUL
1.0000 [drp] | Freq: Two times a day (BID) | OPHTHALMIC | Status: DC
  Administered 2018-04-13 – 2018-04-16 (×6): 1 [drp] via OPHTHALMIC
  Filled 2018-04-12 (×6): qty 1

## 2018-04-12 MED ORDER — POLYETHYLENE GLYCOL 3350 17 G PO PACK: 17.0000 g | PACK | Freq: Every day | ORAL | Status: DC | PRN

## 2018-04-12 MED ORDER — MORPHINE SULFATE (PF) 2 MG/ML IV SOLN
2.0000 mg | INTRAVENOUS | Status: DC | PRN
  Administered 2018-04-12 – 2018-04-16 (×25): 2 mg via INTRAVENOUS
  Filled 2018-04-12 (×25): qty 1

## 2018-04-12 MED ORDER — ONDANSETRON HCL 4 MG PO TABS: 4.0000 mg | ORAL_TABLET | Freq: Four times a day (QID) | ORAL | Status: DC | PRN

## 2018-04-12 MED ORDER — NICOTINE 21 MG/24HR TD PT24
21.0000 mg | MEDICATED_PATCH | Freq: Every day | TRANSDERMAL | Status: DC
  Administered 2018-04-12 – 2018-04-16 (×5): 21 mg via TRANSDERMAL
  Filled 2018-04-12 (×5): qty 1

## 2018-04-12 MED ORDER — SODIUM CHLORIDE 0.9 % IV SOLN
INTRAVENOUS | Status: DC
  Administered 2018-04-12 – 2018-04-15 (×4): via INTRAVENOUS

## 2018-04-12 MED ORDER — IPRATROPIUM-ALBUTEROL 0.5-2.5 (3) MG/3ML IN SOLN: 3.0000 mL | Freq: Four times a day (QID) | RESPIRATORY_TRACT | Status: DC | PRN

## 2018-04-12 MED ORDER — ONDANSETRON HCL 4 MG/2ML IJ SOLN
4.0000 mg | Freq: Once | INTRAMUSCULAR | Status: AC
  Administered 2018-04-12: 4 mg via INTRAVENOUS
  Filled 2018-04-12: qty 2

## 2018-04-12 MED ORDER — ACETAMINOPHEN 650 MG RE SUPP: 650.0000 mg | Freq: Four times a day (QID) | RECTAL | Status: DC | PRN

## 2018-04-12 MED ORDER — METOPROLOL TARTRATE 5 MG/5ML IV SOLN
5.0000 mg | Freq: Once | INTRAVENOUS | Status: AC
  Administered 2018-04-12: 5 mg via INTRAVENOUS
  Filled 2018-04-12: qty 5

## 2018-04-12 NOTE — H&P (Signed)
History and Physical    Brady Bell ZOX:[id number] DOB: 01/21/1956 DOA: 04/12/2018  Referring MD/NP/PA: Dr. Estell HarpinZammit. PCP: Kari BaarsHawkins, Edward, MD  Patient coming from: Home  Chief Complaint: Abdominal pain, nausea and anorexia.  HPI: Brady FabryKenneth L Bell is a 63 y.o. male with a past medical history significant for gastroesophageal reflux disease, peripheral arterial disease (status post aortofemoral bypass), hypertension, tobacco abuse, alcohol abuse, seizure disorder and prior history of pancreatitis (status post cholecystectomy); who presented to the emergency department complaining of midepigastric pain, nausea and anorexia.  Patient reports symptom has been present for the last 24-48 hours and worsening.  At this time he has no being able to take anything by mouth as just thinking on eating aggravates his pain.  No relieving factors.  Pain 8-9 out of 10 in intensity, mid epigastric radiating to his back, crampy and stabbing in nature. Patient reported no vomiting, no fever, no chills, no headaches, no chest pain, no focal weakness, no dysuria, no hematuria, no hematochezia, no melena or any other associated complaint.  In the ED work-up demonstrated a lipase in the 319 range, CT abdomen demonstrated acute pancreatitis.  Patient received pain medication, IV fluids and antiemetics.  [facility] patient for further evaluation and management.  Past Medical/Surgical History: Past Medical History:  Diagnosis Date  . Chronic ankle pain   . Chronic knee pain   . GERD (gastroesophageal reflux disease)   . Hepatitis    Hep A in 1980's  . Hypoglycemia   . Migraines   . Peripheral vascular disease (HCC)   . Recurrent sinus infections   . Seizures (HCC)   . Shingles     Past Surgical History:  Procedure Laterality Date  . ABDOMINAL AORTOGRAM N/A 04/23/2017   Procedure: ABDOMINAL AORTOGRAM;  Surgeon: Fransisco Hertzhen, Brian L, MD;  [facility];  Service:  Cardiovascular;  Laterality: N/A;  . CHOLECYSTECTOMY N/A 09/23/2017   Procedure: LAPAROSCOPIC CHOLECYSTECTOMY;  Surgeon: Franky MachoJenkins, Mark, MD;  Location: [facility];  Service: General;  Laterality: N/A;  . ENDARTERECTOMY FEMORAL Left 04/28/2017   Procedure: ENDARTERECTOMY LEFT ILIO-FEMORAL ARTERY;  Surgeon: Fransisco Hertzhen, Brian L, MD;  [facility];  Service: Vascular;  Laterality: Left;  . FASCIOTOMY  09/06/2011   Procedure: FASCIOTOMY;  Surgeon: Fransisco HertzBrian L Chen, MD;  [facility];  Service: Vascular;  Laterality: Left;  . FEMORAL-TIBIAL BYPASS GRAFT  09/06/2011   Procedure: BYPASS GRAFT FEMORAL-TIBIAL ARTERY;  Surgeon: Fransisco HertzBrian L Chen, MD;  [facility];  Service: Vascular;  Laterality: Left;  . KNEE CARTILAGE SURGERY Left   . LOWER EXTREMITY ANGIOGRAM N/A 09/05/2011   Procedure: LOWER EXTREMITY ANGIOGRAM;  Surgeon: Sherren Kernsharles E Fields, MD;  [facility];  Service: Cardiovascular;  Laterality: N/A;  . LOWER EXTREMITY ANGIOGRAM Left 04/15/2012   Procedure: LOWER EXTREMITY ANGIOGRAM;  Surgeon: Fransisco HertzBrian L Chen, MD;  [facility];  Service: Cardiovascular;  Laterality: Left;  . LOWER EXTREMITY ANGIOGRAPHY Bilateral 04/23/2017   Procedure: Lower Extremity Angiography;  Surgeon: Fransisco Hertzhen, Brian L, MD;  [facility];  Service: Cardiovascular;  Laterality: Bilateral;  . PATCH ANGIOPLASTY Left 04/28/2017   Procedure: ILIO-FEMORAL ARTERY PATCH ANGIOPLASTY USING Livia SnellenXENOSURE BIOLOGIC PATCH;  Surgeon: Fransisco Hertzhen, Brian L, MD;  [facility];  Service: Vascular;  Laterality: Left;  . PR VEIN BYPASS GRAFT,AORTO-FEM-POP  09/06/11   Left   . TOOTH EXTRACTION  June-July-Aug. 2015   Several     Social History:  reports that he  has been smoking cigarettes. He has a 15.00 pack-year smoking history. He has never used smokeless tobacco. He reports current alcohol use of about 3.0 standard drinks of alcohol per week. He reports current drug use. Drug: Marijuana.  Allergies: No Known Allergies  Family History:    Family History  Problem Relation Age of Onset  . Alcohol abuse Father   . Kidney failure Father   . Other Mother        "age"  . Diabetes Other     Prior to Admission medications   Medication Sig Start Date End Date Taking? Authorizing Provider  aspirin EC 81 MG tablet Take 81 mg by mouth daily.   Yes [provider]  cycloSPORINE (RESTASIS) 0.05 % ophthalmic emulsion Place 1 drop into both eyes 2 (two) times daily.   Yes [provider]  diltiazem (CARDIZEM CD) 120 MG 24 hr capsule Take 1 capsule (120 mg total) by mouth daily. 08/03/17  Yes Kari Baars, MD  divalproex (DEPAKOTE ER) 500 MG 24 hr tablet Take one tab at bedtime.  Please call [phone] to schedule yearly follow up. 02/22/18  Yes Levert Feinstein, MD  eletriptan (RELPAX) 40 MG tablet Take 40 mg by mouth every 2 (two) hours as needed for migraine or headache. May repeat in 2 hours if headache persists or recurs.    Yes [provider]  fluticasone (FLONASE) 50 MCG/ACT nasal spray Place 2 sprays into both nostrils daily.   Yes [provider]  gabapentin (NEURONTIN) 300 MG capsule Take 300 mg by mouth daily.    Yes [provider]  Multiple Vitamin (MULTIVITAMIN WITH MINERALS) TABS tablet Take 1 tablet by mouth daily.   Yes [provider]  nicotine (NICODERM CQ - DOSED IN MG/24 HOURS) 21 mg/24hr patch Place 1 patch (21 mg total) onto the skin daily. 08/03/17  Yes Kari Baars, MD  pantoprazole (PROTONIX) 40 MG tablet Take 1 tablet (40 mg total) by mouth daily before breakfast. 08/03/17  Yes Kari Baars, MD  SUMAtriptan (IMITREX) 100 MG tablet Take 1 tablet (100 mg total) by mouth every 2 (two) hours as needed for migraine. May repeat in 2 hours if headache persists or recurs. 12/08/16  Yes Levert Feinstein, MD  traZODone (DESYREL) 50 MG tablet Take 50 mg by mouth at bedtime.   Yes [provider]    Review of Systems: Negative except as otherwise mentioned in  HPI.   Physical Exam: Vitals:   04/12/18 1130 04/12/18 1200 04/12/18 1300 04/12/18 1542  BP: (!) 151/100 (!) 172/108 (!) 181/103 (!) 188/120  Pulse: 91 97 94 91  Resp:    20  Temp:    99 F (37.2 C)  TempSrc:      SpO2: 95% 96% 95% 100%  Weight:    64.8 kg  Height:    5\' 10"  (1.778 m)   Constitutional: NAD, denying chest pain and shortness of breath.  Patient expressed being nauseated and having significant mid abdomen pain. Eyes: PERRL, lids and conjunctivae norma, no icterus, no nystagmus. ENMT: Mucous membranes are moist. Posterior pharynx clear of any exudate or lesions. Neck: normal, supple, no masses, no thyromegaly, no JVD. Respiratory: Good air movement bilaterally.  Positive scattered rhonchi.  No wheezing. Cardiovascular: Regular rate and rhythm, no murmurs / rubs / gallops. No extremity edema. 2+ pedal pulses. No carotid bruits.  Abdomen: no distention; mild tenderness to palpation in his mid abdomen tenderness, no masses palpated. No hepatosplenomegaly. Bowel sounds positive.  Musculoskeletal:  no clubbing / cyanosis. No joint deformity upper and lower extremities. Good ROM, no contractures. Normal muscle tone.  Skin: no rashes, lesions, ulcers. No induration Neurologic: CN 2-12 grossly intact. Sensation intact, DTR normal. Strength 5/5 in all 4.  Psychiatric: Normal judgment and insight. Alert and oriented x 3. Normal mood.    Labs on Admission: I have personally reviewed the following labs and imaging studies  CBC: Recent Labs  Lab 04/12/18 1021  WBC 11.3*  NEUTROABS 9.1*  HGB 16.1  HCT 47.3  MCV 97.5  PLT 184   Basic Metabolic Panel: Recent Labs  Lab 04/12/18 1041  NA 131*  K 4.0  CL 97*  CO2 22  GLUCOSE 118*  BUN 11  CREATININE 0.76  CALCIUM 9.0  MG 1.8  PHOS 3.3   GFR: Estimated Creatinine Clearance: 90 mL/min (by C-G formula based on SCr of 0.76 mg/dL).   Liver Function Tests: Recent Labs  Lab 04/12/18 1041  AST 24  ALT 17  ALKPHOS 93   BILITOT 1.1  PROT 7.3  ALBUMIN 4.0   Recent Labs  Lab 04/12/18 1041  LIPASE 319*   Urine analysis:    Component Value Date/Time   COLORURINE YELLOW 07/29/2017 1500   APPEARANCEUR CLEAR 07/29/2017 1500   LABSPEC 1.006 07/29/2017 1500   PHURINE 7.0 07/29/2017 1500   GLUCOSEU NEGATIVE 07/29/2017 1500   HGBUR NEGATIVE 07/29/2017 1500   BILIRUBINUR NEGATIVE 07/29/2017 1500   KETONESUR 5 (A) 07/29/2017 1500   PROTEINUR NEGATIVE 07/29/2017 1500   UROBILINOGEN 0.2 01/05/2015 1316   NITRITE NEGATIVE 07/29/2017 1500   LEUKOCYTESUR NEGATIVE 07/29/2017 1500   Radiological Exams on Admission: Ct Abdomen Pelvis W Contrast  Result Date: 04/12/2018 CLINICAL DATA:  Acute right flank pain. EXAM: CT ABDOMEN AND PELVIS WITH CONTRAST TECHNIQUE: Multidetector CT imaging of the abdomen and pelvis was performed using the standard protocol following bolus administration of intravenous contrast. CONTRAST:  100mL OMNIPAQUE IOHEXOL 300 MG/ML  SOLN COMPARISON:  CT scan of Jul 30, 2017. FINDINGS: Lower chest: No acute abnormality. Hepatobiliary: No focal liver abnormality is seen. Status post cholecystectomy. No biliary dilatation. Pancreas: No ductal dilatation is noted. Inflammatory changes are noted around the pancreatic head and body concerning for acute pancreatitis. No pseudocyst formation is seen at this time. Spleen: Calcified splenic granulomata are noted. Adrenals/Urinary Tract: Adrenal glands are unremarkable. Kidneys are normal, without renal calculi, focal lesion, or hydronephrosis. Bladder is unremarkable. Stomach/Bowel: Stomach is within normal limits. Appendix appears normal. No evidence of bowel wall thickening, distention, or inflammatory changes. Vascular/Lymphatic: Aortic atherosclerosis. No enlarged abdominal or pelvic lymph nodes. Reproductive: Prostate is unremarkable. Other: No abdominal wall hernia or abnormality. No abdominopelvic ascites. Musculoskeletal: No acute or significant osseous  findings. IMPRESSION: Findings are concerning for mild pancreatitis. No pseudocyst formation is seen at this time. Aortic Atherosclerosis (ICD10-I70.0). Electronically Signed   By: Lupita RaiderJames  Green Jr, M.D.   On: 04/12/2018 11:52    EKG: None  Assessment/Plan 1-Pancreatitis, acute -Appears to be secondary to alcohol abuse -Patient with prior history of cholecystectomy -Lipase in the 319 range -Patient reporting epigastric pain and nausea.  Currently no vomiting -Admitted for bowel rest (n.p.o. status), fluid resuscitation, as needed analgesics and antiemetics. -will allow sips of water with medications (minimizing which meds are continue at this point). -Alcohol cessation counseling has been provided -Follow clinical response. -Checking CMET and lipase in a.m.  2-PAD (peripheral artery disease) (HCC) -Good pulses appreciated. -Continue aspirin -Patient advised to quit tobacco abuse.  3-Tobacco abuse  I have discussed tobacco cessation with the patient.  I have counseled the patient regarding the negative impacts of continued tobacco use including but not limited to lung cancer, COPD, and cardiovascular disease.  I have discussed alternatives to tobacco and modalities that may help facilitate tobacco cessation including but not limited to biofeedback, hypnosis, and medications. Total time spent with tobacco counseling was 4 minutes. -nicotine patch ordered.  4-GERD (gastroesophageal reflux disease) -Continue PPI  5-Alcohol abuse -Cessation counseling provided -Will use CIWA protocol -Patient reports no major signs/symptoms of withdrawal when he has quit alcohol consumption. -Thiamine and folic acid started.  6-Hx of seizure disorder -Continue Depakote.  7-history of hypertension and atrial flutter: -Overall stable and well-controlled -Continue the use of Cardizem and aspirin for secondary prevention. -Will monitor on telemetry.   DVT prophylaxis: Heparin Code Status: Full  code Family Communication: No family at bedside. Disposition Plan: Anticipate discharge back home once medically stable (resolution of acute pancreatitis, able to take medications and nutrition without problem by mouth). Consults called: None Admission status: Inpatient, length of state more than 2 midnights; telemetry bed.   Time Spent: 60 minutes  Vassie Loll MD [facility] Pager [phone]  If 7PM-7AM, please contact night-coverage [email or web address]  04/12/2018, 5:07 PM

## 2018-04-12 NOTE — ED Provider Notes (Signed)
[facility]ENCY [facility]PART[facility] Provider Note   CSN: [id number] Arrival date & time: 04/12/18  0930     History   Chief Complaint Chief Complaint  Patient presents with  . Abdominal Pain    HPI Brady Bell is a 63 y.o. male.  Patient complains of abdominal pain.  Patient has a history of pancreatitis.  The history is provided by the patient. No language interpreter was used.  Abdominal Pain  Pain location:  Generalized Pain quality: aching   Pain radiates to:  Does not radiate Pain severity:  Severe Onset quality:  Sudden Duration:  1 day Progression:  Worsening Chronicity:  New Context: not alcohol use   Relieved by:  Nothing Associated symptoms: no chest pain, no cough, no diarrhea, no fatigue and no hematuria     Past Medical History:  Diagnosis Date  . Chronic ankle pain   . Chronic knee pain   . GERD (gastroesophageal reflux disease)   . Hepatitis    Hep A in 1980's  . Hypoglycemia   . Migraines   . Peripheral vascular disease (HCC)   . Recurrent sinus infections   . Seizures (HCC)   . Shingles     Patient Active Problem List   Diagnosis Date Noted  . Gallstone pancreatitis   . Erythrocytosis 09/10/2017  . Calculus of gallbladder with cholecystitis without biliary obstruction   . Atrial flutter (HCC) 07/29/2017  . Acute gallstone pancreatitis 07/22/2017  . Acute alcoholic pancreatitis 07/22/2017  . Hypertensive urgency 07/22/2017  . Acute pancreatitis 07/22/2017  . Accelerated hypertension   . Tobacco abuse counseling   . PAD (peripheral artery disease) (HCC) 04/28/2017  . Chronic migraine 02/11/2017  . New daily persistent headache 12/08/2016  . Peripheral vascular disease (HCC) 12/08/2016  . Pain in joint, lower leg 02/10/2014  . Numbness and tingling of left leg 12/16/2013  . Cramps of left lower extremity-Leg 12/16/2013  . Aftercare following surgery of the circulatory system, NEC 12/16/2013  . PVD (peripheral vascular disease) with  claudication (HCC) 05/07/2012  . Pain in limb 03/26/2012  . Pain, lower leg 12/26/2011  . Atherosclerosis of native arteries of extremity with intermittent claudication (HCC) 09/26/2011  . CLOSED FRACTURE OF UPPER END OF TIBIA 02/27/2010  . TEAR MEDIAL MENISCUS 02/27/2010    Past Surgical History:  Procedure Laterality Date  . ABDOMINAL AORTOGRAM N/A 04/23/2017   Procedure: ABDOMINAL AORTOGRAM;  Surgeon: Fransisco Hertzhen, Brian L, MD;  [facility];  Service: Cardiovascular;  Laterality: N/A;  . CHOLECYSTECTOMY N/A 09/23/2017   Procedure: LAPAROSCOPIC CHOLECYSTECTOMY;  Surgeon: Franky MachoJenkins, Mark, MD;  Location: [facility];  Service: General;  Laterality: N/A;  . ENDARTERECTOMY FEMORAL Left 04/28/2017   Procedure: ENDARTERECTOMY LEFT ILIO-FEMORAL ARTERY;  Surgeon: Fransisco Hertzhen, Brian L, MD;  [facility];  Service: Vascular;  Laterality: Left;  . FASCIOTOMY  09/06/2011   Procedure: FASCIOTOMY;  Surgeon: Fransisco HertzBrian L Chen, MD;  [facility];  Service: Vascular;  Laterality: Left;  . FEMORAL-TIBIAL BYPASS GRAFT  09/06/2011   Procedure: BYPASS GRAFT FEMORAL-TIBIAL ARTERY;  Surgeon: Fransisco HertzBrian L Chen, MD;  [facility];  Service: Vascular;  Laterality: Left;  . KNEE CARTILAGE SURGERY Left   . LOWER EXTREMITY ANGIOGRAM N/A 09/05/2011   Procedure: LOWER EXTREMITY ANGIOGRAM;  Surgeon: Sherren Kernsharles E Fields, MD;  [facility];  Service: Cardiovascular;  Laterality: N/A;  . LOWER EXTREMITY ANGIOGRAM Left 04/15/2012   Procedure: LOWER EXTREMITY ANGIOGRAM;  Surgeon: Fransisco HertzBrian L Chen, MD;  [facility];  Service: Cardiovascular;  Laterality: Left;  . LOWER EXTREMITY ANGIOGRAPHY Bilateral 04/23/2017   Procedure: Lower Extremity Angiography;  Surgeon: Fransisco Hertz, MD;  Location: [facility] INVASIVE CV LAB;  Service: Cardiovascular;  Laterality: Bilateral;  . PATCH ANGIOPLASTY Left 04/28/2017   Procedure: ILIO-FEMORAL ARTERY PATCH ANGIOPLASTY USING Livia Snellen BIOLOGIC PATCH;  Surgeon: Fransisco Hertz, MD;  Location: [facility] OR;   Service: Vascular;  Laterality: Left;  . PR VEIN BYPASS GRAFT,AORTO-FEM-POP  09/06/11   Left   . TOOTH EXTRACTION  June-July-Aug. 2015   Several         Home Medications    Prior to Admission medications   Medication Sig Start Date End Date Taking? Authorizing Provider  aspirin EC 81 MG tablet Take 81 mg by mouth daily.   Yes [provider]  cycloSPORINE (RESTASIS) 0.05 % ophthalmic emulsion Place 1 drop into both eyes 2 (two) times daily.   Yes [provider]  diltiazem (CARDIZEM CD) 120 MG 24 hr capsule Take 1 capsule (120 mg total) by mouth daily. 08/03/17  Yes Kari Baars, MD  divalproex (DEPAKOTE ER) 500 MG 24 hr tablet Take one tab at bedtime.  Please call [phone] to schedule yearly follow up. 02/22/18  Yes Levert Feinstein, MD  eletriptan (RELPAX) 40 MG tablet Take 40 mg by mouth every 2 (two) hours as needed for migraine or headache. May repeat in 2 hours if headache persists or recurs.    Yes [provider]  fluticasone (FLONASE) 50 MCG/ACT nasal spray Place 2 sprays into both nostrils daily.   Yes [provider]  gabapentin (NEURONTIN) 300 MG capsule Take 300 mg by mouth daily.    Yes [provider]  Multiple Vitamin (MULTIVITAMIN WITH MINERALS) TABS tablet Take 1 tablet by mouth daily.   Yes [provider]  nicotine (NICODERM CQ - DOSED IN MG/24 HOURS) 21 mg/24hr patch Place 1 patch (21 mg total) onto the skin daily. 08/03/17  Yes Kari Baars, MD  pantoprazole (PROTONIX) 40 MG tablet Take 1 tablet (40 mg total) by mouth daily before breakfast. 08/03/17  Yes Kari Baars, MD  SUMAtriptan (IMITREX) 100 MG tablet Take 1 tablet (100 mg total) by mouth every 2 (two) hours as needed for migraine. May repeat in 2 hours if headache persists or recurs. 12/08/16  Yes Levert Feinstein, MD  traZODone (DESYREL) 50 MG tablet Take 50 mg by mouth at bedtime.   Yes [provider]    Family History Family History  Problem Relation  Age of Onset  . Alcohol abuse Father   . Kidney failure Father   . Other Mother        "age"  . Diabetes Other     Social History Social History   Tobacco Use  . Smoking status: Current Every Day Smoker    Packs/day: 0.50    Years: 30.00    Pack years: 15.00    Types: Cigarettes  . Smokeless tobacco: Never Used  . Tobacco comment: pt states that he is trying his best to quit  Substance Use Topics  . Alcohol use: Yes    Alcohol/week: 3.0 standard drinks    Types: 3 Standard drinks or equivalent per week    Comment: Few drinks per day  . Drug use: Yes    Types: Marijuana    Comment: occasionally     Allergies   Patient has no known allergies.   Review of Systems Review of Systems  Constitutional: Negative for appetite change and fatigue.  HENT: Negative for congestion, ear discharge and sinus pressure.   Eyes: Negative for discharge.  Respiratory: Negative for cough.   Cardiovascular: Negative for chest pain.  Gastrointestinal: Positive for abdominal pain. Negative for diarrhea.  Genitourinary: Negative for frequency and hematuria.  Musculoskeletal: Negative for back pain.  Skin: Negative for rash.  Neurological: Negative for seizures and headaches.  Psychiatric/Behavioral: Negative for hallucinations.     Physical Exam Updated Vital Signs BP (!) 172/108   Pulse 97   Temp 99.1 F (37.3 C) (Oral)   Resp 18   Ht 5\' 10"  (1.778 m)   Wt 63.5 kg   SpO2 96%   BMI 20.09 kg/m   Physical Exam Vitals signs reviewed.  Constitutional:      Appearance: He is well-developed.  HENT:     Head: Normocephalic.     Nose: Nose normal.  Eyes:     General: No scleral icterus.    Conjunctiva/sclera: Conjunctivae normal.  Neck:     Musculoskeletal: Neck supple.     Thyroid: No thyromegaly.  Cardiovascular:     Rate and Rhythm: Normal rate and regular rhythm.     Heart sounds: No murmur. No friction rub. No gallop.   Pulmonary:     Breath sounds: No stridor. No  wheezing or rales.  Chest:     Chest wall: No tenderness.  Abdominal:     General: There is no distension.     Tenderness: There is abdominal tenderness. There is no rebound.  Musculoskeletal: Normal range of motion.  Lymphadenopathy:     Cervical: No cervical adenopathy.  Skin:    Findings: No erythema or rash.  Neurological:     Mental Status: He is alert and oriented to person, place, and time.     Motor: No abnormal muscle tone.     Coordination: Coordination normal.  Psychiatric:        Behavior: Behavior normal.      ED Treatments / Results  Labs (all labs ordered are listed, but only abnormal results are displayed) Labs Reviewed  CBC WITH DIFFERENTIAL/PLATELET - Abnormal; Notable for the following components:      Result Value   WBC 11.3 (*)    Neutro Abs 9.1 (*)    All other components within normal limits  COMPREHENSIVE METABOLIC PANEL - Abnormal; Notable for the following components:   Sodium 131 (*)    Chloride 97 (*)    Glucose, Bld 118 (*)    All other components within normal limits  LIPASE, BLOOD - Abnormal; Notable for the following components:   Lipase 319 (*)    All other components within normal limits    EKG None  Radiology Ct Abdomen Pelvis W Contrast  Result Date: 04/12/2018 CLINICAL DATA:  Acute right flank pain. EXAM: CT ABDOMEN AND PELVIS WITH CONTRAST TECHNIQUE: Multidetector CT imaging of the abdomen and pelvis was performed using the standard protocol following bolus administration of intravenous contrast. CONTRAST:  OMNIPAQUE IOHEXOL 300 MG/ML  SOLN COMPARISON:  CT scan of Jul 30, 2017. FINDINGS: Lower chest: No acute abnormality. Hepatobiliary: No focal liver abnormality is seen. Status post cholecystectomy. No biliary dilatation. Pancreas: No ductal dilatation is noted. Inflammatory changes are noted around the pancreatic head and body concerning for acute pancreatitis. No pseudocyst formation is seen at this time. Spleen: Calcified  splenic granulomata are noted. Adrenals/Urinary Tract: Adrenal glands are unremarkable. Kidneys are normal, without renal calculi, focal lesion, or hydronephrosis. Bladder is unremarkable. Stomach/Bowel: Stomach is within normal  limits. Appendix appears normal. No evidence of bowel wall thickening, distention, or inflammatory changes. Vascular/Lymphatic: Aortic atherosclerosis. No enlarged abdominal or pelvic lymph nodes. Reproductive: Prostate is unremarkable. Other: No abdominal wall hernia or abnormality. No abdominopelvic ascites. Musculoskeletal: No acute or significant osseous findings. IMPRESSION: Findings are concerning for mild pancreatitis. No pseudocyst formation is seen at this time. Aortic Atherosclerosis (ICD10-I70.0). Electronically Signed   By: Lupita RaiderJames  Green Jr, M.D.   On: 04/12/2018 11:52    Procedures Procedures (including critical care time)  Medications Ordered in ED Medications  HYDROmorphone (DILAUDID) injection 1 mg (has no administration in time range)  HYDROmorphone (DILAUDID) injection 1 mg (1 mg Intravenous Given 04/12/18 1013)  ondansetron (ZOFRAN) injection 4 mg (4 mg Intravenous Given 04/12/18 1013)  HYDROmorphone (DILAUDID) injection 1 mg (1 mg Intravenous Given 04/12/18 1109)  iohexol (OMNIPAQUE) 300 MG/ML solution 100 mL (100 mLs Intravenous Contrast Given 04/12/18 1129)     Initial Impression / Assessment and Plan / ED Course  I have reviewed the triage vital signs and the nursing notes.  Pertinent labs & imaging results that were available during my care of the patient were reviewed by me and considered in my medical decision making (see chart for details).     Patient with acute pancreatitis.  He will be admitted to medicine for further treatment  Final Clinical Impressions(s) / ED Diagnoses   Final diagnoses:  Pain of upper abdomen    ED Discharge Orders    None       Bethann BerkshireZammit, Bentzion Dauria, MD 04/12/18 1422

## 2018-04-12 NOTE — ED Triage Notes (Signed)
Pt states that he is having upper abd pain the pain is causing him to have nausea but no vomiting

## 2018-04-12 NOTE — Progress Notes (Signed)
Notified by central tele patient had HR in the 150s-170s. On assessment, patient asymptomatic at rest in bed. HR in the 110s on tele. Tele strip appears to be run of SVT. Heart rhythm regular on auscultation. Hydralazine ordered for elevated BP with parameters to give for SBP >180 or DBP >110. Discussed with Dr. Gwenlyn Perking. No interventions at this time, aware of history of atrial flutter, did not want EKG at this time since patient asymptomatic and HR 105 currently. Hydralazine to be given as ordered for elevated BP and nursing staff to monitor and notify MD of any concerns. Earnstine Regal, RN

## 2018-04-13 LAB — COMPREHENSIVE METABOLIC PANEL
ALT: 16 U/L (ref 0–44)
AST: 26 U/L (ref 15–41)
Albumin: 3.7 g/dL (ref 3.5–5.0)
Alkaline Phosphatase: 91 U/L (ref 38–126)
Anion gap: 11 (ref 5–15)
BUN: 13 mg/dL (ref 6–20)
CO2: 24 mmol/L (ref 22–32)
Calcium: 8.7 mg/dL — ABNORMAL LOW (ref 8.9–10.3)
Chloride: 97 mmol/L — ABNORMAL LOW (ref 98–111)
Creatinine, Ser: 0.66 mg/dL (ref 0.61–1.24)
GFR calc Af Amer: >60 mL/min (ref >60)
GFR calc non Af Amer: >60 mL/min (ref >60)
Glucose, Bld: 110 mg/dL — ABNORMAL HIGH (ref 70–99)
POTASSIUM: 4 mmol/L (ref 3.5–5.1)
SODIUM: 132 mmol/L — AB (ref 135–145)
Total Bilirubin: 1.2 mg/dL (ref 0.3–1.2)
Total Protein: 7 g/dL (ref 6.5–8.1)

## 2018-04-13 LAB — CBC
HCT: 46.8 % (ref 39.0–52.0)
Hemoglobin: 15.7 g/dL (ref 13.0–17.0)
MCH: 33.1 pg (ref 26.0–34.0)
MCHC: 33.5 g/dL (ref 30.0–36.0)
MCV: 98.5 fL (ref 80.0–100.0)
NRBC: 0 % (ref 0.0–0.2)
Platelets: 192 10*3/uL (ref 150–400)
RBC: 4.75 MIL/uL (ref 4.22–5.81)
RDW: 12 % (ref 11.5–15.5)
WBC: 14.7 10*3/uL — ABNORMAL HIGH (ref 4.0–10.5)

## 2018-04-13 LAB — LIPASE, BLOOD: Lipase: 257 U/L — ABNORMAL HIGH (ref 11–51)

## 2018-04-13 MED ORDER — ELETRIPTAN HYDROBROMIDE 40 MG PO TABS
40.0000 mg | ORAL_TABLET | Freq: Once | ORAL | Status: DC
  Filled 2018-04-13: qty 1

## 2018-04-13 MED ORDER — DIPHENHYDRAMINE HCL 50 MG/ML IJ SOLN
25.0000 mg | Freq: Once | INTRAMUSCULAR | Status: AC
  Administered 2018-04-13: 25 mg via INTRAVENOUS
  Filled 2018-04-13: qty 1

## 2018-04-13 MED ORDER — METOCLOPRAMIDE HCL 5 MG/ML IJ SOLN
10.0000 mg | Freq: Once | INTRAMUSCULAR | Status: AC
  Administered 2018-04-13: 10 mg via INTRAVENOUS
  Filled 2018-04-13: qty 2

## 2018-04-13 MED ORDER — KETOROLAC TROMETHAMINE 30 MG/ML IJ SOLN
30.0000 mg | Freq: Once | INTRAMUSCULAR | Status: AC
  Administered 2018-04-13: 30 mg via INTRAVENOUS
  Filled 2018-04-13: qty 1

## 2018-04-13 NOTE — Progress Notes (Signed)
Subjective: He says he feels a little bit better as far as his abdomen is concerned.  He is not having nausea now.  He had an episode of SVT which she had had on 1 of his previous admissions.  I think he had atrial fib with rapid ventricular response in the past and I will review that.  I think he is minimizing his alcohol and tobacco use history.  He has had trouble with alcohol withdrawal on previous admissions.  Objective: Vital signs in last 24 hours: Temp:  [98.2 F (36.8 C)-99.1 F (37.3 C)] 98.2 F (36.8 C) (01/14 0644) Pulse Rate:  [83-111] 95 (01/14 0644) Resp:  [18-20] 18 (01/14 0644) BP: (141-193)/(100-123) 165/101 (01/14 0644) SpO2:  [93 %-100 %] 97 % (01/14 0644) Weight:  [63.5 kg-64.8 kg] 64.8 kg (01/13 1542) Weight change:  Last BM Date: 04/11/17  Intake/Output from previous day: 01/13 0701 - 01/14 0700 In: 1018.8 [I.V.:1018.8] Out: -   PHYSICAL EXAM General appearance: alert, cooperative and no distress Resp: clear to auscultation bilaterally Cardio: regular rate and rhythm, S1, S2 normal, no murmur, click, rub or gallop GI: He is mildly tender and somewhat distended in his abdomen diffusely Extremities: extremities normal, atraumatic, no cyanosis or edema  Lab Results:  Results for orders placed or performed during the hospital encounter of 04/12/18 (from the past 48 hour(s))  CBC with Differential/Platelet     Status: Abnormal   Collection Time: 04/12/18 10:21 AM  Result Value Ref Range   WBC 11.3 (H) 4.0 - 10.5 K/uL   RBC 4.85 4.22 - 5.81 MIL/uL   Hemoglobin 16.1 13.0 - 17.0 g/dL   HCT 16.1 09.6 - 04.5 %   MCV 97.5 80.0 - 100.0 fL   MCH 33.2 26.0 - 34.0 pg   MCHC 34.0 30.0 - 36.0 g/dL   RDW 40.9 81.1 - 91.4 %   Platelets 184 150 - 400 K/uL   nRBC 0.0 0.0 - 0.2 %   Neutrophils Relative % 80 %   Neutro Abs 9.1 (H) 1.7 - 7.7 K/uL   Lymphocytes Relative 10 %   Lymphs Abs 1.1 0.7 - 4.0 K/uL   Monocytes Relative 8 %   Monocytes Absolute 0.9 0.1 - 1.0  K/uL   Eosinophils Relative 1 %   Eosinophils Absolute 0.1 0.0 - 0.5 K/uL   Basophils Relative 1 %   Basophils Absolute 0.1 0.0 - 0.1 K/uL   Immature Granulocytes 0 %   Abs Immature Granulocytes 0.04 0.00 - 0.07 K/uL    Comment: Performed at [facility], [address], [place], [place] [address]  Comprehensive metabolic panel     Status: Abnormal   Collection Time: 04/12/18 10:41 AM  Result Value Ref Range   Sodium 131 (L) 135 - 145 mmol/L   Potassium 4.0 3.5 - 5.1 mmol/L   Chloride 97 (L) 98 - 111 mmol/L   CO2 22 22 - 32 mmol/L   Glucose, Bld 118 (H) 70 - 99 mg/dL   BUN 11 6 - 20 mg/dL   Creatinine, Ser 6.21 0.61 - 1.24 mg/dL   Calcium 9.0 8.9 - 30.8 mg/dL   Total Protein 7.3 6.5 - 8.1 g/dL   Albumin 4.0 3.5 - 5.0 g/dL   AST 24 15 - 41 U/L   ALT 17 0 - 44 U/L   Alkaline Phosphatase 93 38 - 126 U/L   Total Bilirubin 1.1 0.3 - 1.2 mg/dL   GFR calc non Af Amer >60 >60 mL/min  GFR calc Af Amer >60 >60 mL/min   Anion gap 12 5 - 15    Comment: Performed at [facility], [address] [place], [place] [address]  Lipase, blood     Status: Abnormal   Collection Time: 04/12/18 10:41 AM  Result Value Ref Range   Lipase 319 (H) 11 - 51 U/L    Comment: Performed at [facility], [address] [place], [place] [address]  Magnesium     Status: None   Collection Time: 04/12/18 10:41 AM  Result Value Ref Range   Magnesium 1.8 1.7 - 2.4 mg/dL    Comment: Performed at [facility], [address] [place], [place] [address]  Phosphorus     Status: None   Collection Time: 04/12/18 10:41 AM  Result Value Ref Range   Phosphorus 3.3 2.5 - 4.6 mg/dL    Comment: Performed at [facility], [address] [place], [place] [address]  TSH     Status: None   Collection Time: 04/12/18 10:41 AM  Result Value Ref Range   TSH 2.187 0.350 - 4.500 uIU/mL    Comment: Performed by a 3rd Generation assay with a functional sensitivity of <=0.01 uIU/mL. Performed at [facility], [address]  [place], [place] [address]   CBC     Status: Abnormal   Collection Time: 04/13/18  5:50 AM  Result Value Ref Range   WBC 14.7 (H) 4.0 - 10.5 K/uL   RBC 4.75 4.22 - 5.81 MIL/uL   Hemoglobin 15.7 13.0 - 17.0 g/dL   HCT 79.8 92.1 - 19.4 %   MCV 98.5 80.0 - 100.0 fL   MCH 33.1 26.0 - 34.0 pg   MCHC 33.5 30.0 - 36.0 g/dL   RDW 17.4 08.1 - 44.8 %   Platelets 192 150 - 400 K/uL   nRBC 0.0 0.0 - 0.2 %    Comment: Performed at [facility], [address] [place], [place] [address]  Comprehensive metabolic panel     Status: Abnormal   Collection Time: 04/13/18  5:50 AM  Result Value Ref Range   Sodium 132 (L) 135 - 145 mmol/L   Potassium 4.0 3.5 - 5.1 mmol/L   Chloride 97 (L) 98 - 111 mmol/L   CO2 24 22 - 32 mmol/L   Glucose, Bld 110 (H) 70 - 99 mg/dL   BUN 13 6 - 20 mg/dL   Creatinine, Ser 1.49 0.61 - 1.24 mg/dL   Calcium 8.7 (L) 8.9 - 10.3 mg/dL   Total Protein 7.0 6.5 - 8.1 g/dL   Albumin 3.7 3.5 - 5.0 g/dL   AST 26 15 - 41 U/L   ALT 16 0 - 44 U/L   Alkaline Phosphatase 91 38 - 126 U/L   Total Bilirubin 1.2 0.3 - 1.2 mg/dL   GFR calc non Af Amer >60 >60 mL/min   GFR calc Af Amer >60 >60 mL/min   Anion gap 11 5 - 15    Comment: Performed at [facility], [address], [place], [place] [address]  Lipase, blood     Status: Abnormal   Collection Time: 04/13/18  5:50 AM  Result Value Ref Range   Lipase 257 (H) 11 - 51 U/L    Comment: Performed at [facility], [address] [place], [place] [address]    ABGS No results for input(s): PHART, PO2ART, TCO2, HCO3 in the last 72 hours.  Invalid input(s): PCO2 CULTURES No results found for this or any previous visit (from  the past 240 hour(s)). Studies/Results: Ct Abdomen Pelvis W Contrast  Result Date: 04/12/2018 CLINICAL DATA:  Acute right flank pain. EXAM: CT ABDOMEN AND PELVIS WITH CONTRAST TECHNIQUE: Multidetector CT imaging of the abdomen and pelvis was performed using the standard protocol following bolus administration of  intravenous contrast. CONTRAST:  100mL OMNIPAQUE IOHEXOL 300 MG/ML  SOLN COMPARISON:  CT scan of Jul 30, 2017. FINDINGS: Lower chest: No acute abnormality. Hepatobiliary: No focal liver abnormality is seen. Status post cholecystectomy. No biliary dilatation. Pancreas: No ductal dilatation is noted. Inflammatory changes are noted around the pancreatic head and body concerning for acute pancreatitis. No pseudocyst formation is seen at this time. Spleen: Calcified splenic granulomata are noted. Adrenals/Urinary Tract: Adrenal glands are unremarkable. Kidneys are normal, without renal calculi, focal lesion, or hydronephrosis. Bladder is unremarkable. Stomach/Bowel: Stomach is within normal limits. Appendix appears normal. No evidence of bowel wall thickening, distention, or inflammatory changes. Vascular/Lymphatic: Aortic atherosclerosis. No enlarged abdominal or pelvic lymph nodes. Reproductive: Prostate is unremarkable. Other: No abdominal wall hernia or abnormality. No abdominopelvic ascites. Musculoskeletal: No acute or significant osseous findings. IMPRESSION: Findings are concerning for mild pancreatitis. No pseudocyst formation is seen at this time. Aortic Atherosclerosis (ICD10-I70.0). Electronically Signed   By: Lupita RaiderJames  Green Jr, M.D.   On: 04/12/2018 11:52    Medications:  Prior to Admission:  Medications Prior to Admission  Medication Sig Dispense Refill Last Dose  . aspirin EC 81 MG tablet Take 81 mg by mouth daily.   04/11/2018 at Unknown time  . cycloSPORINE (RESTASIS) 0.05 % ophthalmic emulsion Place 1 drop into both eyes 2 (two) times daily.   Past Week at Unknown time  . diltiazem (CARDIZEM CD) 120 MG 24 hr capsule Take 1 capsule (120 mg total) by mouth daily. 30 capsule 12 04/11/2018 at Unknown time  . divalproex (DEPAKOTE ER) 500 MG 24 hr tablet Take one tab at bedtime.  Please call [phone] to schedule yearly follow up. 90 tablet 0 04/11/2018 at Unknown time  . eletriptan (RELPAX) 40 MG tablet  Take 40 mg by mouth every 2 (two) hours as needed for migraine or headache. May repeat in 2 hours if headache persists or recurs.    Past Month at Unknown time  . fluticasone (FLONASE) 50 MCG/ACT nasal spray Place 2 sprays into both nostrils daily.   04/11/2018 at Unknown time  . gabapentin (NEURONTIN) 300 MG capsule Take 300 mg by mouth daily.    04/11/2018 at Unknown time  . Multiple Vitamin (MULTIVITAMIN WITH MINERALS) TABS tablet Take 1 tablet by mouth daily.   04/11/2018 at Unknown time  . nicotine (NICODERM CQ - DOSED IN MG/24 HOURS) 21 mg/24hr patch Place 1 patch (21 mg total) onto the skin daily. 28 patch 0 Past Week at Unknown time  . pantoprazole (PROTONIX) 40 MG tablet Take 1 tablet (40 mg total) by mouth daily before breakfast. 30 tablet 12 04/11/2018 at Unknown time  . SUMAtriptan (IMITREX) 100 MG tablet Take 1 tablet (100 mg total) by mouth every 2 (two) hours as needed for migraine. May repeat in 2 hours if headache persists or recurs. 12 tablet 11 Past Month at Unknown time  . traZODone (DESYREL) 50 MG tablet Take 50 mg by mouth at bedtime.   04/11/2018 at Unknown time   Scheduled: . aspirin EC  81 mg Oral Daily  . cycloSPORINE  1 drop Both Eyes BID  . diltiazem  120 mg Oral Daily  . divalproex  500 mg Oral Daily  .  docusate sodium  100 mg Oral BID  . fluticasone  2 spray Each Nare Daily  . folic acid  1 mg Intravenous Daily  . gabapentin  300 mg Oral Daily  . heparin  5,000 Units Subcutaneous Q8H  . nicotine  21 mg Transdermal Daily  . pantoprazole  40 mg Oral QAC breakfast  . sodium chloride flush  3 mL Intravenous Q12H  . thiamine injection  100 mg Intravenous Daily  . traZODone  50 mg Oral QHS   Continuous: . sodium chloride 100 mL/hr at 04/13/18 0400  . sodium chloride     LNZ:[id number] chloride, acetaminophen **OR** acetaminophen, hydrALAZINE, ipratropium-albuterol, LORazepam **OR** LORazepam, morphine injection, ondansetron **OR** ondansetron (ZOFRAN) IV, polyethylene  glycol, sodium chloride flush  Assesment: He was admitted with acute pancreatitis.  His lipase has gone down a little bit and he is not having as much trouble with his nausea.  He has alcohol abuse and he is on CIWA protocol which is appropriate  Seizure disorder but no seizures recently  He has history of atrial flutter and other cardiac arrhythmias and he had SVT last night.  He has tobacco abuse and he is on a patch  He has significant anxiety which complicates his situation Principal Problem:   Pancreatitis, acute Active Problems:   PAD (peripheral artery disease) (HCC)   Tobacco abuse   GERD (gastroesophageal reflux disease)   Alcohol abuse   Hx of seizure disorder   Hx of atrial flutter   HTN (hypertension)    Plan: He can try clear liquids.  Repeat electrolytes etc. tomorrow    LOS: 1 day   Fredirick Maudlin 04/13/2018, 8:42 AM

## 2018-04-14 LAB — BASIC METABOLIC PANEL
Anion gap: 8 (ref 5–15)
BUN: 16 mg/dL (ref 6–20)
CO2: 23 mmol/L (ref 22–32)
CREATININE: 0.61 mg/dL (ref 0.61–1.24)
Calcium: 8.1 mg/dL — ABNORMAL LOW (ref 8.9–10.3)
Chloride: 99 mmol/L (ref 98–111)
GFR calc Af Amer: >60 mL/min (ref >60)
GFR calc non Af Amer: >60 mL/min (ref >60)
GLUCOSE: 86 mg/dL (ref 70–99)
Potassium: 3.7 mmol/L (ref 3.5–5.1)
Sodium: 130 mmol/L — ABNORMAL LOW (ref 135–145)

## 2018-04-14 LAB — LIPASE, BLOOD: Lipase: 45 U/L (ref 11–51)

## 2018-04-14 MED ORDER — POTASSIUM CHLORIDE CRYS ER 20 MEQ PO TBCR
40.0000 meq | EXTENDED_RELEASE_TABLET | Freq: Once | ORAL | Status: AC
  Administered 2018-04-14: 40 meq via ORAL
  Filled 2018-04-14: qty 2

## 2018-04-14 NOTE — Progress Notes (Signed)
Subjective: He feels better.  He has some abdominal pain but it is improved.  He is tolerated liquids so far.  His lipase is back to normal.  Sodium level is a little bit low.  Objective: Vital signs in last 24 hours: Temp:  [98.1 F (36.7 C)-99.5 F (37.5 C)] 98.1 F (36.7 C) (01/15 0354) Pulse Rate:  [60-85] 60 (01/15 0354) Resp:  [20] 20 (01/15 0354) BP: (102-160)/(67-92) 102/67 (01/15 0354) SpO2:  [93 %-98 %] 93 % (01/15 0354) Weight change:  Last BM Date: 04/11/18  Intake/Output from previous day: 01/14 0701 - 01/15 0700 In: 10 [P.O.:10] Out: 650 [Urine:650]  PHYSICAL EXAM General appearance: alert, cooperative and mild distress Resp: clear to auscultation bilaterally Cardio: regular rate and rhythm, S1, S2 normal, no murmur, click, rub or gallop GI: He still has mild diffuse tenderness but it is improved Extremities: extremities normal, atraumatic, no cyanosis or edema  Lab Results:  Results for orders placed or performed during the hospital encounter of 04/12/18 (from the past 48 hour(s))  CBC with Differential/Platelet     Status: Abnormal   Collection Time: 04/12/18 10:21 AM  Result Value Ref Range   WBC 11.3 (H) 4.0 - 10.5 K/uL   RBC 4.85 4.22 - 5.81 MIL/uL   Hemoglobin 16.1 13.0 - 17.0 g/dL   HCT 04.547.3 40.939.0 - 81.152.0 %   MCV 97.5 80.0 - 100.0 fL   MCH 33.2 26.0 - 34.0 pg   MCHC 34.0 30.0 - 36.0 g/dL   RDW 91.412.3 78.211.5 - 95.615.5 %   Platelets 184 150 - 400 K/uL   nRBC 0.0 0.0 - 0.2 %   Neutrophils Relative % 80 %   Neutro Abs 9.1 (H) 1.7 - 7.7 K/uL   Lymphocytes Relative 10 %   Lymphs Abs 1.1 0.7 - 4.0 K/uL   Monocytes Relative 8 %   Monocytes Absolute 0.9 0.1 - 1.0 K/uL   Eosinophils Relative 1 %   Eosinophils Absolute 0.1 0.0 - 0.5 K/uL   Basophils Relative 1 %   Basophils Absolute 0.1 0.0 - 0.1 K/uL   Immature Granulocytes 0 %   Abs Immature Granulocytes 0.04 0.00 - 0.07 K/uL    Comment: Performed at [facility], [address] [place], [place] [address]   Comprehensive metabolic panel     Status: Abnormal   Collection Time: 04/12/18 10:41 AM  Result Value Ref Range   Sodium 131 (L) 135 - 145 mmol/L   Potassium 4.0 3.5 - 5.1 mmol/L   Chloride 97 (L) 98 - 111 mmol/L   CO2 22 22 - 32 mmol/L   Glucose, Bld 118 (H) 70 - 99 mg/dL   BUN 11 6 - 20 mg/dL   Creatinine, Ser 6.570.76 0.61 - 1.24 mg/dL   Calcium 9.0 8.9 - 84.610.3 mg/dL   Total Protein 7.3 6.5 - 8.1 g/dL   Albumin 4.0 3.5 - 5.0 g/dL   AST 24 15 - 41 U/L   ALT 17 0 - 44 U/L   Alkaline Phosphatase 93 38 - 126 U/L   Total Bilirubin 1.1 0.3 - 1.2 mg/dL   GFR calc non Af Amer >60 >60 mL/min   GFR calc Af Amer >60 >60 mL/min   Anion gap 12 5 - 15    Comment: Performed at [facility], [address] [place], [place] [address]  Lipase, blood     Status: Abnormal   Collection Time: 04/12/18 10:41 AM  Result Value Ref Range   Lipase 319 (H)  11 - 51 U/L    Comment: Performed at [facility], [address], [place], [place] [address]  Magnesium     Status: None   Collection Time: 04/12/18 10:41 AM  Result Value Ref Range   Magnesium 1.8 1.7 - 2.4 mg/dL    Comment: Performed at [facility], [address] [place], [place] [address]  Phosphorus     Status: None   Collection Time: 04/12/18 10:41 AM  Result Value Ref Range   Phosphorus 3.3 2.5 - 4.6 mg/dL    Comment: Performed at [facility], [address], [place], [place] [address]  TSH     Status: None   Collection Time: 04/12/18 10:41 AM  Result Value Ref Range   TSH 2.187 0.350 - 4.500 uIU/mL    Comment: Performed by a 3rd Generation assay with a functional sensitivity of <=0.01 uIU/mL. Performed at [facility], [address], [place], [place] [address]   CBC     Status: Abnormal   Collection Time: 04/13/18  5:50 AM  Result Value Ref Range   WBC 14.7 (H) 4.0 - 10.5 K/uL   RBC 4.75 4.22 - 5.81 MIL/uL   Hemoglobin 15.7 13.0 - 17.0 g/dL   HCT 29.5 62.1 - 30.8 %   MCV 98.5 80.0 - 100.0 fL   MCH 33.1 26.0 - 34.0 pg   MCHC 33.5  30.0 - 36.0 g/dL   RDW 65.7 84.6 - 96.2 %   Platelets 192 150 - 400 K/uL   nRBC 0.0 0.0 - 0.2 %    Comment: Performed at [facility], [address], [place], [place] [address]  Comprehensive metabolic panel     Status: Abnormal   Collection Time: 04/13/18  5:50 AM  Result Value Ref Range   Sodium 132 (L) 135 - 145 mmol/L   Potassium 4.0 3.5 - 5.1 mmol/L   Chloride 97 (L) 98 - 111 mmol/L   CO2 24 22 - 32 mmol/L   Glucose, Bld 110 (H) 70 - 99 mg/dL   BUN 13 6 - 20 mg/dL   Creatinine, Ser 1.32 0.61 - 1.24 mg/dL   Calcium 8.7 (L) 8.9 - 10.3 mg/dL   Total Protein 7.0 6.5 - 8.1 g/dL   Albumin 3.7 3.5 - 5.0 g/dL   AST 26 15 - 41 U/L   ALT 16 0 - 44 U/L   Alkaline Phosphatase 91 38 - 126 U/L   Total Bilirubin 1.2 0.3 - 1.2 mg/dL   GFR calc non Af Amer >60 >60 mL/min   GFR calc Af Amer >60 >60 mL/min   Anion gap 11 5 - 15    Comment: Performed at [facility], [address], [place], [place] [address]  Lipase, blood     Status: Abnormal   Collection Time: 04/13/18  5:50 AM  Result Value Ref Range   Lipase 257 (H) 11 - 51 U/L    Comment: Performed at [facility], [address] [place], [place] [address]  Lipase, blood     Status: None   Collection Time: 04/14/18  5:15 AM  Result Value Ref Range   Lipase 45 11 - 51 U/L    Comment: Performed at [facility], [address] [place], [place] [address]  Basic metabolic panel     Status: Abnormal   Collection Time: 04/14/18  5:15 AM  Result Value Ref Range   Sodium 130 (L) 135 - 145 mmol/L   Potassium 3.7 3.5 - 5.1 mmol/L   Chloride  99 98 - 111 mmol/L   CO2 23 22 - 32 mmol/L   Glucose, Bld 86 70 - 99 mg/dL   BUN 16 6 - 20 mg/dL   Creatinine, Ser 1.610.61 0.61 - 1.24 mg/dL   Calcium 8.1 (L) 8.9 - 10.3 mg/dL   GFR calc non Af Amer >60 >60 mL/min   GFR calc Af Amer >60 >60 mL/min   Anion gap 8 5 - 15    Comment: Performed at [facility], [address] [place], [place] [address]    ABGS No results for input(s): PHART, PO2ART, TCO2,  HCO3 in the last 72 hours.  Invalid input(s): PCO2 CULTURES No results found for this or any previous visit (from the past 240 hour(s)). Studies/Results: Ct Abdomen Pelvis W Contrast  Result Date: 04/12/2018 CLINICAL DATA:  Acute right flank pain. EXAM: CT ABDOMEN AND PELVIS WITH CONTRAST TECHNIQUE: Multidetector CT imaging of the abdomen and pelvis was performed using the standard protocol following bolus administration of intravenous contrast. CONTRAST:  100mL OMNIPAQUE IOHEXOL 300 MG/ML  SOLN COMPARISON:  CT scan of Jul 30, 2017. FINDINGS: Lower chest: No acute abnormality. Hepatobiliary: No focal liver abnormality is seen. Status post cholecystectomy. No biliary dilatation. Pancreas: No ductal dilatation is noted. Inflammatory changes are noted around the pancreatic head and body concerning for acute pancreatitis. No pseudocyst formation is seen at this time. Spleen: Calcified splenic granulomata are noted. Adrenals/Urinary Tract: Adrenal glands are unremarkable. Kidneys are normal, without renal calculi, focal lesion, or hydronephrosis. Bladder is unremarkable. Stomach/Bowel: Stomach is within normal limits. Appendix appears normal. No evidence of bowel wall thickening, distention, or inflammatory changes. Vascular/Lymphatic: Aortic atherosclerosis. No enlarged abdominal or pelvic lymph nodes. Reproductive: Prostate is unremarkable. Other: No abdominal wall hernia or abnormality. No abdominopelvic ascites. Musculoskeletal: No acute or significant osseous findings. IMPRESSION: Findings are concerning for mild pancreatitis. No pseudocyst formation is seen at this time. Aortic Atherosclerosis (ICD10-I70.0). Electronically Signed   By: Lupita RaiderJames  Green Jr, M.D.   On: 04/12/2018 11:52    Medications:  Prior to Admission:  Medications Prior to Admission  Medication Sig Dispense Refill Last Dose  . aspirin EC 81 MG tablet Take 81 mg by mouth daily.   04/11/2018 at Unknown time  . cycloSPORINE (RESTASIS) 0.05  % ophthalmic emulsion Place 1 drop into both eyes 2 (two) times daily.   Past Week at Unknown time  . diltiazem (CARDIZEM CD) 120 MG 24 hr capsule Take 1 capsule (120 mg total) by mouth daily. 30 capsule 12 04/11/2018 at Unknown time  . divalproex (DEPAKOTE ER) 500 MG 24 hr tablet Take one tab at bedtime.  Please call [phone] to schedule yearly follow up. 90 tablet 0 04/11/2018 at Unknown time  . eletriptan (RELPAX) 40 MG tablet Take 40 mg by mouth every 2 (two) hours as needed for migraine or headache. May repeat in 2 hours if headache persists or recurs.    Past Month at Unknown time  . fluticasone (FLONASE) 50 MCG/ACT nasal spray Place 2 sprays into both nostrils daily.   04/11/2018 at Unknown time  . gabapentin (NEURONTIN) 300 MG capsule Take 300 mg by mouth daily.    04/11/2018 at Unknown time  . Multiple Vitamin (MULTIVITAMIN WITH MINERALS) TABS tablet Take 1 tablet by mouth daily.   04/11/2018 at Unknown time  . nicotine (NICODERM CQ - DOSED IN MG/24 HOURS) 21 mg/24hr patch Place 1 patch (21 mg total) onto the skin daily. 28 patch 0 Past Week at Unknown time  .  pantoprazole (PROTONIX) 40 MG tablet Take 1 tablet (40 mg total) by mouth daily before breakfast. 30 tablet 12 04/11/2018 at Unknown time  . SUMAtriptan (IMITREX) 100 MG tablet Take 1 tablet (100 mg total) by mouth every 2 (two) hours as needed for migraine. May repeat in 2 hours if headache persists or recurs. 12 tablet 11 Past Month at Unknown time  . traZODone (DESYREL) 50 MG tablet Take 50 mg by mouth at bedtime.   04/11/2018 at Unknown time   Scheduled: . aspirin EC  81 mg Oral Daily  . cycloSPORINE  1 drop Both Eyes BID  . diltiazem  120 mg Oral Daily  . divalproex  500 mg Oral Daily  . docusate sodium  100 mg Oral BID  . fluticasone  2 spray Each Nare Daily  . folic acid  1 mg Intravenous Daily  . gabapentin  300 mg Oral Daily  . heparin  5,000 Units Subcutaneous Q8H  . nicotine  21 mg Transdermal Daily  . pantoprazole  40 mg  Oral QAC breakfast  . sodium chloride flush  3 mL Intravenous Q12H  . thiamine injection  100 mg Intravenous Daily  . traZODone  50 mg Oral QHS   Continuous: . sodium chloride 100 mL/hr at 04/13/18 2333  . sodium chloride     ZOX:WRUEAV chloride, acetaminophen **OR** acetaminophen, hydrALAZINE, ipratropium-albuterol, LORazepam **OR** LORazepam, morphine injection, ondansetron **OR** ondansetron (ZOFRAN) IV, polyethylene glycol, sodium chloride flush  Assesment: He was admitted with acute pancreatitis.  He is improving and lipase is back to normal.  He has low sodium likely related to his chronic alcohol abuse  He has chronic alcohol abuse and he is on alcohol withdrawal protocol  He has chronic tobacco abuse and he is receiving patches  He has had episodes of atrial flutter/fib in the past when he has been sick with the pancreatitis and had a short run of SVT this admission but otherwise is doing okay.  He has peripheral arterial disease which is stable Principal Problem:   Pancreatitis, acute Active Problems:   PAD (peripheral artery disease) (HCC)   Tobacco abuse   GERD (gastroesophageal reflux disease)   Alcohol abuse   Hx of seizure disorder   Hx of atrial flutter   HTN (hypertension)    Plan: Continue treatments.  Decrease IV fluids.  His potassium is 3.7 I like to see him over 4 so he will get some potassium.  Advance his diet.    LOS: 2 days   Brady Bell 04/14/2018, 8:11 AM

## 2018-04-15 LAB — BASIC METABOLIC PANEL
Anion gap: 8 (ref 5–15)
BUN: 7 mg/dL (ref 6–20)
CO2: 26 mmol/L (ref 22–32)
Calcium: 8.3 mg/dL — ABNORMAL LOW (ref 8.9–10.3)
Chloride: 99 mmol/L (ref 98–111)
Creatinine, Ser: 0.55 mg/dL — ABNORMAL LOW (ref 0.61–1.24)
GFR calc Af Amer: >60 mL/min (ref >60)
GFR calc non Af Amer: >60 mL/min (ref >60)
GLUCOSE: 98 mg/dL (ref 70–99)
Potassium: 3.7 mmol/L (ref 3.5–5.1)
Sodium: 133 mmol/L — ABNORMAL LOW (ref 135–145)

## 2018-04-15 MED ORDER — FOLIC ACID 1 MG PO TABS
1.0000 mg | ORAL_TABLET | Freq: Every day | ORAL | Status: DC
  Administered 2018-04-16: 1 mg via ORAL
  Filled 2018-04-15: qty 1

## 2018-04-15 MED ORDER — THIAMINE HCL 100 MG/ML IJ SOLN
100.0000 mg | Freq: Every day | INTRAMUSCULAR | Status: DC
  Administered 2018-04-16: 100 mg via INTRAVENOUS
  Filled 2018-04-15: qty 2

## 2018-04-15 MED ORDER — HYDROCODONE-ACETAMINOPHEN 5-325 MG PO TABS
1.0000 | ORAL_TABLET | Freq: Four times a day (QID) | ORAL | Status: DC | PRN
  Administered 2018-04-15 – 2018-04-16 (×3): 1 via ORAL
  Filled 2018-04-15 (×3): qty 1

## 2018-04-15 MED ORDER — POTASSIUM CHLORIDE CRYS ER 20 MEQ PO TBCR
40.0000 meq | EXTENDED_RELEASE_TABLET | Freq: Once | ORAL | Status: AC
  Administered 2018-04-15: 40 meq via ORAL
  Filled 2018-04-15: qty 2

## 2018-04-15 NOTE — Progress Notes (Signed)
PHARMACIST - PHYSICIAN COMMUNICATION  DR:   Juanetta Gosling  CONCERNING: IV- to- Oral Route Change Policy  RECOMMENDATION: This patient is receiving folic acid and thiamine by the intravenous route.  Based on criteria approved by the Pharmacy and Therapeutics Committee, the intravenous medication(s) is/are being converted to the equivalent oral dose form(s).   DESCRIPTION: These criteria include:  The patient is eating (either orally or via tube) and/or has been taking other orally administered medications for a least 24 hours  The patient has no evidence of active gastrointestinal bleeding or impaired GI absorption (gastrectomy, short bowel, patient on TNA or NPO).  If you have questions about this conversion, please contact the Pharmacy Department  [x]   [phone] )  [facility] []   [phone] )  [facility] []   [phone] )  [facility] []   [phone] )  [facility] []   [phone] )  [facility]   [place], [facility] 04/15/2018 12:23 PM

## 2018-04-15 NOTE — Progress Notes (Signed)
Subjective: He feels better.  He has no new complaints.  He still has some abdominal pain.  He is tolerating diet fairly well but he does not like his breakfast.  Objective: Vital signs in last 24 hours: Temp:  [98.3 F (36.8 C)-98.7 F (37.1 C)] 98.3 F (36.8 C) (01/16 0627) Pulse Rate:  [69-85] 85 (01/16 0627) Resp:  [18-20] 20 (01/16 0627) BP: (116-145)/(76-100) 145/100 (01/16 0627) SpO2:  [93 %-96 %] 96 % (01/16 0627) Weight change:  Last BM Date: 04/14/18  Intake/Output from previous day: 01/15 0701 - 01/16 0700 In: 1921.7 [P.O.:720; I.V.:1201.7] Out: 2650 [Urine:2650]  PHYSICAL EXAM General appearance: alert, cooperative and no distress Resp: clear to auscultation bilaterally Cardio: regular rate and rhythm, S1, S2 normal, no murmur, click, rub or gallop GI: He still has minimal diffuse abdominal tenderness Extremities: extremities normal, atraumatic, no cyanosis or edema  Lab Results:  Results for orders placed or performed during the hospital encounter of 04/12/18 (from the past 48 hour(s))  Lipase, blood     Status: None   Collection Time: 04/14/18  5:15 AM  Result Value Ref Range   Lipase 45 11 - 51 U/L    Comment: Performed at [facility], [address] [place], [place] [address]  Basic metabolic panel     Status: Abnormal   Collection Time: 04/14/18  5:15 AM  Result Value Ref Range   Sodium 130 (L) 135 - 145 mmol/L   Potassium 3.7 3.5 - 5.1 mmol/L   Chloride 99 98 - 111 mmol/L   CO2 23 22 - 32 mmol/L   Glucose, Bld 86 70 - 99 mg/dL   BUN 16 6 - 20 mg/dL   Creatinine, Ser 2.130.61 0.61 - 1.24 mg/dL   Calcium 8.1 (L) 8.9 - 10.3 mg/dL   GFR calc non Af Amer >60 >60 mL/min   GFR calc Af Amer >60 >60 mL/min   Anion gap 8 5 - 15    Comment: Performed at [facility], [address] [place], [place] [address]  Basic metabolic panel     Status: Abnormal   Collection Time: 04/15/18  6:42 AM  Result Value Ref Range   Sodium 133 (L) 135 - 145 mmol/L   Potassium  3.7 3.5 - 5.1 mmol/L   Chloride 99 98 - 111 mmol/L   CO2 26 22 - 32 mmol/L   Glucose, Bld 98 70 - 99 mg/dL   BUN 7 6 - 20 mg/dL   Creatinine, Ser 8.460.55 (L) 0.61 - 1.24 mg/dL   Calcium 8.3 (L) 8.9 - 10.3 mg/dL   GFR calc non Af Amer >60 >60 mL/min   GFR calc Af Amer >60 >60 mL/min   Anion gap 8 5 - 15    Comment: Performed at [facility], [address] [place], [place] [address]    ABGS No results for input(s): PHART, PO2ART, TCO2, HCO3 in the last 72 hours.  Invalid input(s): PCO2 CULTURES No results found for this or any previous visit (from the past 240 hour(s)). Studies/Results: No results found.  Medications:  Prior to Admission:  Medications Prior to Admission  Medication Sig Dispense Refill Last Dose  . aspirin EC 81 MG tablet Take 81 mg by mouth daily.   04/11/2018 at Unknown time  . cycloSPORINE (RESTASIS) 0.05 % ophthalmic emulsion Place 1 drop into both eyes 2 (two) times daily.   Past Week at Unknown time  . diltiazem (CARDIZEM CD) 120 MG 24 hr capsule Take 1 capsule (120  mg total) by mouth daily. 30 capsule 12 04/11/2018 at Unknown time  . divalproex (DEPAKOTE ER) 500 MG 24 hr tablet Take one tab at bedtime.  Please call [phone] to schedule yearly follow up. 90 tablet 0 04/11/2018 at Unknown time  . eletriptan (RELPAX) 40 MG tablet Take 40 mg by mouth every 2 (two) hours as needed for migraine or headache. May repeat in 2 hours if headache persists or recurs.    Past Month at Unknown time  . fluticasone (FLONASE) 50 MCG/ACT nasal spray Place 2 sprays into both nostrils daily.   04/11/2018 at Unknown time  . gabapentin (NEURONTIN) 300 MG capsule Take 300 mg by mouth daily.    04/11/2018 at Unknown time  . Multiple Vitamin (MULTIVITAMIN WITH MINERALS) TABS tablet Take 1 tablet by mouth daily.   04/11/2018 at Unknown time  . nicotine (NICODERM CQ - DOSED IN MG/24 HOURS) 21 mg/24hr patch Place 1 patch (21 mg total) onto the skin daily. 28 patch 0 Past Week at Unknown time  .  pantoprazole (PROTONIX) 40 MG tablet Take 1 tablet (40 mg total) by mouth daily before breakfast. 30 tablet 12 04/11/2018 at Unknown time  . SUMAtriptan (IMITREX) 100 MG tablet Take 1 tablet (100 mg total) by mouth every 2 (two) hours as needed for migraine. May repeat in 2 hours if headache persists or recurs. 12 tablet 11 Past Month at Unknown time  . traZODone (DESYREL) 50 MG tablet Take 50 mg by mouth at bedtime.   04/11/2018 at Unknown time   Scheduled: . aspirin EC  81 mg Oral Daily  . cycloSPORINE  1 drop Both Eyes BID  . diltiazem  120 mg Oral Daily  . divalproex  500 mg Oral Daily  . docusate sodium  100 mg Oral BID  . fluticasone  2 spray Each Nare Daily  . folic acid  1 mg Intravenous Daily  . gabapentin  300 mg Oral Daily  . heparin  5,000 Units Subcutaneous Q8H  . nicotine  21 mg Transdermal Daily  . pantoprazole  40 mg Oral QAC breakfast  . sodium chloride flush  3 mL Intravenous Q12H  . thiamine injection  100 mg Intravenous Daily  . traZODone  50 mg Oral QHS   Continuous: . sodium chloride 50 mL/hr at 04/15/18 0510  . sodium chloride     AVW:[id number] chloride, acetaminophen **OR** acetaminophen, hydrALAZINE, ipratropium-albuterol, LORazepam **OR** LORazepam, morphine injection, ondansetron **OR** ondansetron (ZOFRAN) IV, polyethylene glycol, sodium chloride flush  Assesment: He was admitted with acute pancreatitis related to alcohol abuse.  He is improving.  He still has some abdominal pain.  Lipase is back to normal.  He has COPD at baseline with ongoing tobacco abuse and he is on a nicotine patch.  His breathing is doing okay  He has chronic alcoholism and he is on withdrawal protocol.  He has had seizures in the past but none recently  He had an episode of atrial flutter with rapid ventricular response during 1 of his previous admissions for pancreatitis but we do not have any evidence of that now Principal Problem:   Pancreatitis, acute Active Problems:   PAD  (peripheral artery disease) (HCC)   Tobacco abuse   GERD (gastroesophageal reflux disease)   Alcohol abuse   Hx of seizure disorder   Hx of atrial flutter   HTN (hypertension)    Plan: Continue treatments.  Potential discharge tomorrow.  He still has some abdominal discomfort however    LOS: 3  days   Fredirick Maudlindward L Jakwan Sally 04/15/2018, 8:38 AM

## 2018-04-16 ENCOUNTER — Encounter (HOSPITAL_COMMUNITY): Payer: Self-pay | Admitting: Cardiology

## 2018-04-16 DIAGNOSIS — I471 Supraventricular tachycardia: Secondary | ICD-10-CM

## 2018-04-16 MED ORDER — DILTIAZEM HCL ER COATED BEADS 180 MG PO CP24: 180.0000 mg | ORAL_CAPSULE | Freq: Every day | ORAL | Status: DC

## 2018-04-16 MED ORDER — DILTIAZEM HCL ER COATED BEADS 180 MG PO CP24: 180.0000 mg | ORAL_CAPSULE | Freq: Every day | ORAL | 12 refills | Status: DC

## 2018-04-16 MED ORDER — HYDROCODONE-ACETAMINOPHEN 5-325 MG PO TABS: 1.0000 | ORAL_TABLET | Freq: Four times a day (QID) | ORAL | 0 refills | Status: DC | PRN

## 2018-04-16 MED ORDER — VITAMIN B-1 100 MG PO TABS: 100.0000 mg | ORAL_TABLET | Freq: Every day | ORAL | Status: DC

## 2018-04-16 MED ORDER — THIAMINE HCL 100 MG PO TABS: 100.0000 mg | ORAL_TABLET | Freq: Every day | ORAL | Status: DC

## 2018-04-16 NOTE — Discharge Summary (Signed)
Physician Discharge Summary  Patient ID: Brady Bell MRN: [id number] DOB/AGE: Mar 11, 1956 63 y.o. Primary Care Physician:Galadriel Shroff, Ramon Dredge, MD Admit date: 04/12/2018 Discharge date: 04/16/2018    Discharge Diagnoses:   Principal Problem:   Pancreatitis, acute Active Problems:   PAD (peripheral artery disease) (HCC)   Tobacco abuse   GERD (gastroesophageal reflux disease)   Alcohol abuse   Hx of seizure disorder   Hx of atrial flutter   HTN (hypertension) paroxysmal atrial fib  Allergies as of 04/16/2018   No Known Allergies     Medication List    TAKE these medications   aspirin EC 81 MG tablet Take 81 mg by mouth daily.   cycloSPORINE 0.05 % ophthalmic emulsion Commonly known as:  RESTASIS Place 1 drop into both eyes 2 (two) times daily.   diltiazem 180 MG 24 hr capsule Commonly known as:  CARDIZEM CD Take 1 capsule (180 mg total) by mouth daily. Start taking on:  April 17, 2018 What changed:    medication strength  how much to take   divalproex 500 MG 24 hr tablet Commonly known as:  DEPAKOTE ER Take one tab at bedtime.  Please call [phone] to schedule yearly follow up.   eletriptan 40 MG tablet Commonly known as:  RELPAX Take 40 mg by mouth every 2 (two) hours as needed for migraine or headache. May repeat in 2 hours if headache persists or recurs.   fluticasone 50 MCG/ACT nasal spray Commonly known as:  FLONASE Place 2 sprays into both nostrils daily.   gabapentin 300 MG capsule Commonly known as:  NEURONTIN Take 300 mg by mouth daily.   HYDROcodone-acetaminophen 5-325 MG tablet Commonly known as:  NORCO/VICODIN Take 1 tablet by mouth every 6 (six) hours as needed for moderate pain.   multivitamin with minerals Tabs tablet Take 1 tablet by mouth daily.   nicotine 21 mg/24hr patch Commonly known as:  NICODERM CQ - dosed in mg/24 hours Place 1 patch (21 mg total) onto the skin daily.   pantoprazole 40 MG tablet Commonly known as:   PROTONIX Take 1 tablet (40 mg total) by mouth daily before breakfast.   SUMAtriptan 100 MG tablet Commonly known as:  IMITREX Take 1 tablet (100 mg total) by mouth every 2 (two) hours as needed for migraine. May repeat in 2 hours if headache persists or recurs.   traZODone 50 MG tablet Commonly known as:  DESYREL Take 50 mg by mouth at bedtime.       Discharged Condition:improved    Consults:cardiology  Significant Diagnostic Studies: Ct Abdomen Pelvis W Contrast  Result Date: 04/12/2018 CLINICAL DATA:  Acute right flank pain. EXAM: CT ABDOMEN AND PELVIS WITH CONTRAST TECHNIQUE: Multidetector CT imaging of the abdomen and pelvis was performed using the standard protocol following bolus administration of intravenous contrast. CONTRAST:  OMNIPAQUE IOHEXOL 300 MG/ML  SOLN COMPARISON:  CT scan of Jul 30, 2017. FINDINGS: Lower chest: No acute abnormality. Hepatobiliary: No focal liver abnormality is seen. Status post cholecystectomy. No biliary dilatation. Pancreas: No ductal dilatation is noted. Inflammatory changes are noted around the pancreatic head and body concerning for acute pancreatitis. No pseudocyst formation is seen at this time. Spleen: Calcified splenic granulomata are noted. Adrenals/Urinary Tract: Adrenal glands are unremarkable. Kidneys are normal, without renal calculi, focal lesion, or hydronephrosis. Bladder is unremarkable. Stomach/Bowel: Stomach is within normal limits. Appendix appears normal. No evidence of bowel wall thickening, distention, or inflammatory changes. Vascular/Lymphatic: Aortic atherosclerosis. No enlarged abdominal or pelvic lymph  nodes. Reproductive: Prostate is unremarkable. Other: No abdominal wall hernia or abnormality. No abdominopelvic ascites. Musculoskeletal: No acute or significant osseous findings. IMPRESSION: Findings are concerning for mild pancreatitis. No pseudocyst formation is seen at this time. Aortic Atherosclerosis (ICD10-I70.0).  Electronically Signed   By: Lupita Raider, M.D.   On: 04/12/2018 11:52    Lab Results: Basic Metabolic Panel: Recent Labs    04/14/18 0515 04/15/18 0642  NA 130* 133*  K 3.7 3.7  CL 99 99  CO2 23 26  GLUCOSE 86 98  BUN 16 7  CREATININE 0.61 0.55*  CALCIUM 8.1* 8.3*   Liver Function Tests: No results for input(s): AST, ALT, ALKPHOS, BILITOT, PROT, ALBUMIN in the last 72 hours.   CBC: No results for input(s): WBC, NEUTROABS, HGB, HCT, MCV, PLT in the last 72 hours.  No results found for this or any previous visit (from the past 240 hour(s)).   Hospital Course: this is a 63 yo who came to the ED with pancreatitis related to alcohol abuse. He was treated with ivf, pain meds and was started on alcohol withdrawal protocol. He improved was able to transition to diet and oral meds. He was almost at baseline but had a burst of atrial fib. This resolved spontaneously. Cardiology consult was obtained and he had reverted to sinus rhythm. It was recommended that he increase diltiazem to 180 mg and follow up with cardiology. He was cleared for discharge.  Discharge Exam: Blood pressure (!) 124/92, pulse 80, temperature (!) 97.5 F (36.4 C), resp. rate 18, height 5\' 10"  (1.778 m), weight 64.8 kg, SpO2 94 %. He is awake and alert. Minimal abdominal tenderness. Sinus rhythm. Calm.  Disposition: home. He has home health aid.      Signed: Fredirick Maudlin   04/16/2018, 11:48 AM

## 2018-04-16 NOTE — Consult Note (Addendum)
Cardiology Consult    Patient ID: Brady Bell; [id number]; 06/21/1955   Admit date: 04/12/2018 Date of Consult: 04/16/2018  Primary Care Provider: Kari Baars, MD Primary Cardiologist: Evaluated by Dr. Purvis Sheffield in 07/2017.  Patient Profile    Brady Bell is a 63 y.o. male with past medical history of PVD (s/p left iliofemoral endarterectomy and angioplasty in 03/2017), paroxysmal atrial flutter (diagnosed in 07/2017 during admission for pancreatitis), tobacco use and alcohol use who is being seen today for the evaluation of atrial fibrillation with RVR at the request of Dr. Juanetta Gosling.   History of Present Illness    Brady Bell was last examined by Cardiology during admission in 07/2017 for new onset atrial flutter occurring in the setting of pancreatitis and hypokalemia. Echocardiogram showed a preserved EF of 60 to 65% with no regional wall motion abnormalities. He was started on Cardizem CD 120 mg daily and was not started on anticoagulation given his CHA2DS2-VASc Score of 1 and in the setting of chronic alcohol abuse.   He presented to [facility] ED on 04/12/2018 for evaluation of upper abdominal pain with associated nausea. Lipase was found to be elevated to 319 and CT Abdomen consistent with mild pancreatitis. He was admitted for further management of acute pancreatitis thought to be secondary to alcohol abuse. His symptoms have continued to improve and he has been noted to have intermittent episodes of SVT during admission and was continued on PTA Cardizem CD 120 mg daily.  He was examined by Dr. Juanetta Gosling earlier this morning and deemed stable for discharge  Following this, he was noted to have an episode of atrial tachycardia which lasted for 15 to 20 minutes. In talking with the patient, he reports being overall asymptomatic with this. Denies any associated chest pain, dyspnea, lightheadedness, dizziness, or presyncope. Reports he had been walking to the restroom around the time  the episode started. In review of telemetry, he has had intermittent episodes of tachycardia throughout admission with heart rate peaking into the 150's to 160's and only lasting for 5 to 10 minutes. No persistent tachycardia noted. He is now back in normal sinus rhythm with heart rate in the 70's.  Reports continuing to smoke 4 to 5 cigarettes/day but has been trying to reduce his use as he was previously smoking 1 pack/day.  Past Medical History:  Diagnosis Date  . Chronic ankle pain   . Chronic knee pain   . GERD (gastroesophageal reflux disease)   . Hepatitis    Hep A in 1980's  . History of atrial flutter    Documented April/May 2019 in the setting of pancreatitis  . Hypoglycemia   . Migraines   . Pancreatitis   . Peripheral vascular disease (HCC)   . Recurrent sinus infections   . Seizures (HCC)   . Shingles     Past Surgical History:  Procedure Laterality Date  . ABDOMINAL AORTOGRAM N/A 04/23/2017   Procedure: ABDOMINAL AORTOGRAM;  Surgeon: Fransisco Hertz, MD;  [facility] INVASIVE CV LAB;  Service: Cardiovascular;  Laterality: N/A;  . CHOLECYSTECTOMY N/A 09/23/2017   Procedure: LAPAROSCOPIC CHOLECYSTECTOMY;  Surgeon: Franky Macho, MD;  Location: [facility] ORS;  Service: General;  Laterality: N/A;  . ENDARTERECTOMY FEMORAL Left 04/28/2017   Procedure: ENDARTERECTOMY LEFT ILIO-FEMORAL ARTERY;  Surgeon: Fransisco Hertz, MD;  Location: [facility] OR;  Service: Vascular;  Laterality: Left;  . FASCIOTOMY  09/06/2011   Procedure: FASCIOTOMY;  Surgeon: Fransisco Hertz, MD;  Location: [facility] OR;  Service: Vascular;  Laterality: Left;  . FEMORAL-TIBIAL BYPASS GRAFT  09/06/2011   Procedure: BYPASS GRAFT FEMORAL-TIBIAL ARTERY;  Surgeon: Fransisco HertzBrian L Chen, MD;  [facility];  Service: Vascular;  Laterality: Left;  . KNEE CARTILAGE SURGERY Left   . LOWER EXTREMITY ANGIOGRAM N/A 09/05/2011   Procedure: LOWER EXTREMITY ANGIOGRAM;  Surgeon: Sherren Kernsharles E Fields, MD;  [facility];  Service: Cardiovascular;   Laterality: N/A;  . LOWER EXTREMITY ANGIOGRAM Left 04/15/2012   Procedure: LOWER EXTREMITY ANGIOGRAM;  Surgeon: Fransisco HertzBrian L Chen, MD;  [facility];  Service: Cardiovascular;  Laterality: Left;  . LOWER EXTREMITY ANGIOGRAPHY Bilateral 04/23/2017   Procedure: Lower Extremity Angiography;  Surgeon: Fransisco Hertzhen, Brian L, MD;  [facility];  Service: Cardiovascular;  Laterality: Bilateral;  . PATCH ANGIOPLASTY Left 04/28/2017   Procedure: ILIO-FEMORAL ARTERY PATCH ANGIOPLASTY USING Livia SnellenXENOSURE BIOLOGIC PATCH;  Surgeon: Fransisco Hertzhen, Brian L, MD;  [facility];  Service: Vascular;  Laterality: Left;  . PR VEIN BYPASS GRAFT,AORTO-FEM-POP  09/06/11   Left   . TOOTH EXTRACTION  June-July-Aug. 2015   Several      Home Medications:  Prior to Admission medications   Medication Sig Start Date End Date Taking? Authorizing Provider  aspirin EC 81 MG tablet Take 81 mg by mouth daily.   Yes [provider]  cycloSPORINE (RESTASIS) 0.05 % ophthalmic emulsion Place 1 drop into both eyes 2 (two) times daily.   Yes [provider]  diltiazem (CARDIZEM CD) 120 MG 24 hr capsule Take 1 capsule (120 mg total) by mouth daily. 08/03/17  Yes Kari BaarsHawkins, Edward, MD  divalproex (DEPAKOTE ER) 500 MG 24 hr tablet Take one tab at bedtime.  Please call [phone] to schedule yearly follow up. 02/22/18  Yes Levert FeinsteinYan, Yijun, MD  eletriptan (RELPAX) 40 MG tablet Take 40 mg by mouth every 2 (two) hours as needed for migraine or headache. May repeat in 2 hours if headache persists or recurs.    Yes [provider]  fluticasone (FLONASE) 50 MCG/ACT nasal spray Place 2 sprays into both nostrils daily.   Yes [provider]  gabapentin (NEURONTIN) 300 MG capsule Take 300 mg by mouth daily.    Yes [provider]  Multiple Vitamin (MULTIVITAMIN WITH MINERALS) TABS tablet Take 1 tablet by mouth daily.   Yes [provider]  nicotine (NICODERM CQ - DOSED IN MG/24 HOURS) 21 mg/24hr patch Place  1 patch (21 mg total) onto the skin daily. 08/03/17  Yes Kari BaarsHawkins, Edward, MD  pantoprazole (PROTONIX) 40 MG tablet Take 1 tablet (40 mg total) by mouth daily before breakfast. 08/03/17  Yes Kari BaarsHawkins, Edward, MD  SUMAtriptan (IMITREX) 100 MG tablet Take 1 tablet (100 mg total) by mouth every 2 (two) hours as needed for migraine. May repeat in 2 hours if headache persists or recurs. 12/08/16  Yes Levert FeinsteinYan, Yijun, MD  traZODone (DESYREL) 50 MG tablet Take 50 mg by mouth at bedtime.   Yes [provider]  HYDROcodone-acetaminophen (NORCO/VICODIN) 5-325 MG tablet Take 1 tablet by mouth every 6 (six) hours as needed for moderate pain. 04/16/18   Kari BaarsHawkins, Edward, MD    Inpatient Medications: Scheduled Meds: . aspirin EC  81 mg Oral Daily  . cycloSPORINE  1 drop Both Eyes BID  . [START ON 04/17/2018] diltiazem  180 mg Oral Daily  . divalproex  500 mg Oral Daily  . docusate sodium  100 mg Oral BID  . fluticasone  2 spray Each Nare Daily  .  folic acid  1 mg Oral Daily  . gabapentin  300 mg Oral Daily  . heparin  5,000 Units Subcutaneous Q8H  . nicotine  21 mg Transdermal Daily  . pantoprazole  40 mg Oral QAC breakfast  . sodium chloride flush  3 mL Intravenous Q12H  . thiamine injection  100 mg Intravenous Daily  . traZODone  50 mg Oral QHS   Continuous Infusions: . sodium chloride     PRN Meds: sodium chloride, acetaminophen **OR** acetaminophen, hydrALAZINE, HYDROcodone-acetaminophen, ipratropium-albuterol, morphine injection, ondansetron **OR** ondansetron (ZOFRAN) IV, polyethylene glycol, sodium chloride flush  Allergies:   No Known Allergies  Social History:   Social History   Socioeconomic History  . Marital status: Single    Spouse name: Not on file  . Number of children: 0  . Years of education: some college  . Highest education level: Not on file  Occupational History  . Occupation: Disabled  Social Needs  . Financial resource strain: Not on file  . Food insecurity:    Worry:  Not on file    Inability: Not on file  . Transportation needs:    Medical: Not on file    Non-medical: Not on file  Tobacco Use  . Smoking status: Current Every Day Smoker    Packs/day: 0.50    Years: 30.00    Pack years: 15.00    Types: Cigarettes  . Smokeless tobacco: Never Used  . Tobacco comment: pt states that he is trying his best to quit  Substance and Sexual Activity  . Alcohol use: Yes    Alcohol/week: 3.0 standard drinks    Types: 3 Standard drinks or equivalent per week    Comment: Few drinks per day  . Drug use: Yes    Types: Marijuana    Comment: occasionally  . Sexual activity: Yes  Lifestyle  . Physical activity:    Days per week: Not on file    Minutes per session: Not on file  . Stress: Not on file  Relationships  . Social connections:    Talks on phone: Not on file    Gets together: Not on file    Attends religious service: Not on file    Active member of club or organization: Not on file    Attends meetings of clubs or organizations: Not on file    Relationship status: Not on file  . Intimate partner violence:    Fear of current or ex partner: Not on file    Emotionally abused: Not on file    Physically abused: Not on file    Forced sexual activity: Not on file  Other Topics Concern  . Not on file  Social History Narrative   Lives alone.   Left-handed.   2-3 cups caffeine per day.     Family History:    Family History  Problem Relation Age of Onset  . Alcohol abuse Father   . Kidney failure Father   . Other Mother        "age"  . Diabetes Other       Review of Systems    General:  No chills, fever, night sweats or weight changes.  Cardiovascular:  No chest pain, dyspnea on exertion, edema, orthopnea, palpitations, paroxysmal nocturnal dyspnea. Dermatological: No rash, lesions/masses Respiratory: No cough, dyspnea Urologic: No hematuria, dysuria Abdominal:   No vomiting, diarrhea, bright red blood per rectum, melena, or hematemesis.  Positive for nausea and abdominal pain (now improved).  Neurologic:  No visual  changes, wkns, changes in mental status.  All other systems reviewed and are otherwise negative except as noted above.  Physical Exam/Data    Vitals:   04/15/18 0627 04/15/18 2106 04/16/18 0519 04/16/18 0745  BP: (!) 145/100 (!) 119/92 (!) 124/92   Pulse: 85 77 80   Resp: 20 20 18    Temp: 98.3 F (36.8 C) 98.2 F (36.8 C) (!) 97.5 F (36.4 C)   TempSrc: Oral Oral    SpO2: 96% 98% 97% 94%  Weight:      Height:        Intake/Output Summary (Last 24 hours) at 04/16/2018 1148 Last data filed at 04/16/2018 0800 Gross per 24 hour  Intake 483 ml  Output -  Net 483 ml   Filed Weights   04/12/18 0936 04/12/18 0942 04/12/18 1542  Weight: 63.5 kg 63.5 kg 64.8 kg   Body mass index is 20.5 kg/m.   General: Pleasant, Caucasian male appearing in NAD Psych: Normal affect. Neuro: Alert and oriented X 3. Moves all extremities spontaneously. HEENT: Normal  Neck: Supple without bruits or JVD. Lungs:  Resp regular and unlabored, CTA without wheezing or rales. Heart: RRR no s3, s4, or murmurs. Abdomen: Soft, non-tender, non-distended, BS + x 4.  Extremities: No clubbing, cyanosis or edema. DP/PT/Radials 2+ and equal bilaterally.   EKG:  The EKG was personally reviewed and demonstrates: NSR, HR 89, with no acute ST changes when compared to prior tracings from 2019.   Labs/Studies     Relevant CV Studies:  Echocardiogram: 07/2017 Study Conclusions  - Left ventricle: The cavity size was normal. Wall thickness was   normal. Systolic function was normal. The estimated ejection   fraction was in the range of 60% to 65%. Wall motion was normal;   there were no regional wall motion abnormalities. Left   ventricular diastolic function parameters were normal.  Impressions:  - Normal study.  Laboratory Data:  Chemistry Recent Labs  Lab 04/13/18 0550 04/14/18 0515 04/15/18 0642  NA 132* 130* 133*   K 4.0 3.7 3.7  CL 97* 99 99  CO2 24 23 26   GLUCOSE 110* 86 98  BUN 13 16 7   CREATININE 0.66 0.61 0.55*  CALCIUM 8.7* 8.1* 8.3*  GFRNONAA >60 >60 >60  GFRAA >60 >60 >60  ANIONGAP 11 8 8     Recent Labs  Lab 04/12/18 1041 04/13/18 0550  PROT 7.3 7.0  ALBUMIN 4.0 3.7  AST 24 26  ALT 17 16  ALKPHOS 93 91  BILITOT 1.1 1.2   Hematology Recent Labs  Lab 04/12/18 1021 04/13/18 0550  WBC 11.3* 14.7*  RBC 4.85 4.75  HGB 16.1 15.7  HCT 47.3 46.8  MCV 97.5 98.5  MCH 33.2 33.1  MCHC 34.0 33.5  RDW 12.3 12.0  PLT 184 192   Cardiac EnzymesNo results for input(s): TROPONINI in the last 168 hours. No results for input(s): TROPIPOC in the last 168 hours.  BNPNo results for input(s): BNP, PROBNP in the last 168 hours.  DDimer No results for input(s): DDIMER in the last 168 hours.  Radiology/Studies:  No results found.   Assessment & Plan    1. Atrial Tachycardia - initially diagnosed with paroxysmal atrial flutter in 07/2017 during admission for pancreatitis. Noted to have intermittent episodes of recurrent tachycardia this admission which appears most consistent with an atrial tachycardia resembling recurrent atrial flutter but he converts back to NSR prior to EKG's being obtained. Most recent BMET showed K+ at 3.7. Mg 1.8  and TSH  2.187 this admission. Heart rate peaks into the 150's to 160's but then returns back to NSR within 5 to 10 minutes. He is currently on Cardizem CD 120 mg daily. Given that BP has remained stable, would recommend titration of this to 180mg  daily.  - This patients CHA2DS2-VASc Score and unadjusted Ischemic Stroke Rate (% per year) is equal to 0.6 % stroke rate/year from a score of 1 (PAD). Would not recommend initiation of anticoagulation at this time in the setting of chronic alcohol abuse and low-CHA2DS2-VASc Score. Continue ASA 81mg  daily.   2. PVD - s/p left iliofemoral endarterectomy and angioplasty in 03/2017. - followed by Dr. Imogene Burn with Vascular  Surgery.   3. Acute Pancreatitis - thought to be secondary to alcohol abuse. Lipase initially elevated to 319, improved to 45 on most recent check.  - per admitting team.   4. Tobacco Use - previously smoking 1.0 ppd, now down to 4-5 cigarettes per day. Congratulated on reduction with cessation advised.    For questions or updates, please contact [facility] Please consult [email or web address] for contact info under Cardiology/STEMI.  Signed, Ellsworth Lennox, PA-C 04/16/2018, 11:48 AM Pager: [phone]   Attending note:  Patient seen and examined.  I reviewed his records and discussed the case with Ms. Iran Ouch PA-C.  Brady Bell is a history of PAD, tobacco and alcohol abuse, also recurring pancreatitis.  He had documented paroxysmal atrial flutter during flare of pancreatitis in April/May 2019 and was managed conservatively following consultation with Dr. Purvis Sheffield.  He is currently hospitalized with episode of mild pancreatitis, admitted for supportive measures.  He has improved clinically and actually was getting ready to go home today, however Dr. Juanetta Gosling was alerted to intermittent episodes of tachycardia on the patient's cardiac monitor.  Patient denies any recent progressive sense of palpitations or breathlessness, but does state that he sometimes feels his heart skip.  I personally reviewed the patient's telemetry, he has had intermittent episodes of SVT, possibly an atrial tachycardia or atypical atrial flutter lasting only a few minutes at a time.  On current examination he is in sinus rhythm.  Heart rate is in the 70s to 80s at baseline on Cardizem CD 120 mg daily.  Lungs are clear with diminished breath sounds.  Cardiac exam reveals RRR without gallop.  No peripheral edema.  Lab work shows potassium 3.7, BUN 7, creatinine 0.55, hemoglobin 5-15.7, platelets 192.  I personally reviewed his ECG from today which shows sinus rhythm with left atrial enlargement, nonspecific ST-T  changes.  Recurrent episodes of paroxysmal atrial tachycardia versus atypical atrial flutter.  This has been documented previously and he has been on calcium channel blocker at baseline.  At this point would suggest increasing Cardizem CD to 180 mg daily.  His CHADSVASC score is 1-2. I would not be inclined to start antiarrhythmic therapy or anticoagulation in light of his other comorbid illnesses and also alcohol abuse.  Continue aspirin as he was taking as an outpatient.  Jonelle Sidle, M.D., F.A.C.C.

## 2018-04-16 NOTE — Progress Notes (Signed)
Discharged home with instructions given on medications and follow up visits,patient verbalized understanding. Prescriptions sent to Pharmacy of choice documented on AVS. IV discontinued,catheter intact.Accompanied by staff to an awaiting vehicle. 

## 2018-04-16 NOTE — Progress Notes (Signed)
Patient Brady Bell on telemetry with heart rate 190, B/p 156/103,asymptomatic, Dr Juanetta GoslingHawkins notified,orders received and given.Discharge cancelled..Will continue to monitor patient.

## 2018-04-16 NOTE — Progress Notes (Signed)
Subjective: He says he feels fairly well.  He still has some abdominal pain.  He says he is tired from walking yesterday.  He is on all oral medications now and they seem to be working okay.  He ate well yesterday  Objective: Vital signs in last 24 hours: Temp:  [97.5 F (36.4 C)-98.2 F (36.8 C)] 97.5 F (36.4 C) (01/17 0519) Pulse Rate:  [77-80] 80 (01/17 0519) Resp:  [18-20] 18 (01/17 0519) BP: (119-124)/(92) 124/92 (01/17 0519) SpO2:  [94 %-98 %] 94 % (01/17 0745) Weight change:  Last BM Date: 04/15/18  Intake/Output from previous day: 01/16 0701 - 01/17 0700 In: 483 [P.O.:480; I.V.:3] Out: 425 [Urine:425]  PHYSICAL EXAM General appearance: alert, cooperative and mild distress Resp: clear to auscultation bilaterally Cardio: regular rate and rhythm, S1, S2 normal, no murmur, click, rub or gallop GI: Mild abdominal tenderness Extremities: extremities normal, atraumatic, no cyanosis or edema  Lab Results:  Results for orders placed or performed during the hospital encounter of 04/12/18 (from the past 48 hour(s))  Basic metabolic panel     Status: Abnormal   Collection Time: 04/15/18  6:42 AM  Result Value Ref Range   Sodium 133 (L) 135 - 145 mmol/L   Potassium 3.7 3.5 - 5.1 mmol/L   Chloride 99 98 - 111 mmol/L   CO2 26 22 - 32 mmol/L   Glucose, Bld 98 70 - 99 mg/dL   BUN 7 6 - 20 mg/dL   Creatinine, Ser 3.61 (L) 0.61 - 1.24 mg/dL   Calcium 8.3 (L) 8.9 - 10.3 mg/dL   GFR calc non Af Amer >60 >60 mL/min   GFR calc Af Amer >60 >60 mL/min   Anion gap 8 5 - 15    Comment: Performed at [facility], [address], [place], [place] [address]    ABGS No results for input(s): PHART, PO2ART, TCO2, HCO3 in the last 72 hours.  Invalid input(s): PCO2 CULTURES No results found for this or any previous visit (from the past 240 hour(s)). Studies/Results: No results found.  Medications:  Prior to Admission:  Medications Prior to Admission  Medication Sig Dispense Refill  Last Dose  . aspirin EC 81 MG tablet Take 81 mg by mouth daily.   04/11/2018 at Unknown time  . cycloSPORINE (RESTASIS) 0.05 % ophthalmic emulsion Place 1 drop into both eyes 2 (two) times daily.   Past Week at Unknown time  . diltiazem (CARDIZEM CD) 120 MG 24 hr capsule Take 1 capsule (120 mg total) by mouth daily. 30 capsule 12 04/11/2018 at Unknown time  . divalproex (DEPAKOTE ER) 500 MG 24 hr tablet Take one tab at bedtime.  Please call [phone] to schedule yearly follow up. 90 tablet 0 04/11/2018 at Unknown time  . eletriptan (RELPAX) 40 MG tablet Take 40 mg by mouth every 2 (two) hours as needed for migraine or headache. May repeat in 2 hours if headache persists or recurs.    Past Month at Unknown time  . fluticasone (FLONASE) 50 MCG/ACT nasal spray Place 2 sprays into both nostrils daily.   04/11/2018 at Unknown time  . gabapentin (NEURONTIN) 300 MG capsule Take 300 mg by mouth daily.    04/11/2018 at Unknown time  . Multiple Vitamin (MULTIVITAMIN WITH MINERALS) TABS tablet Take 1 tablet by mouth daily.   04/11/2018 at Unknown time  . nicotine (NICODERM CQ - DOSED IN MG/24 HOURS) 21 mg/24hr patch Place 1 patch (21 mg total) onto the skin daily. 28 patch  0 Past Week at Unknown time  . pantoprazole (PROTONIX) 40 MG tablet Take 1 tablet (40 mg total) by mouth daily before breakfast. 30 tablet 12 04/11/2018 at Unknown time  . SUMAtriptan (IMITREX) 100 MG tablet Take 1 tablet (100 mg total) by mouth every 2 (two) hours as needed for migraine. May repeat in 2 hours if headache persists or recurs. 12 tablet 11 Past Month at Unknown time  . traZODone (DESYREL) 50 MG tablet Take 50 mg by mouth at bedtime.   04/11/2018 at Unknown time   Scheduled: . aspirin EC  81 mg Oral Daily  . cycloSPORINE  1 drop Both Eyes BID  . diltiazem  120 mg Oral Daily  . divalproex  500 mg Oral Daily  . docusate sodium  100 mg Oral BID  . fluticasone  2 spray Each Nare Daily  . folic acid  1 mg Oral Daily  . gabapentin  300  mg Oral Daily  . heparin  5,000 Units Subcutaneous Q8H  . nicotine  21 mg Transdermal Daily  . pantoprazole  40 mg Oral QAC breakfast  . sodium chloride flush  3 mL Intravenous Q12H  . thiamine injection  100 mg Intravenous Daily  . traZODone  50 mg Oral QHS   Continuous: . sodium chloride     EOF:HQRFXJ chloride, acetaminophen **OR** acetaminophen, hydrALAZINE, HYDROcodone-acetaminophen, ipratropium-albuterol, morphine injection, ondansetron **OR** ondansetron (ZOFRAN) IV, polyethylene glycol, sodium chloride flush  Assesment: He has acute pancreatitis.  This is a recurrent episode.  He is eating and he is able to manage his pain with oral medication so I think he is okay to go home.  He has tobacco abuse/COPD he is on a patch and that we will continue if he will do that  He has alcohol abuse which is been an ongoing issue and is related to his pancreatitis  He has seizure disorder stable Principal Problem:   Pancreatitis, acute Active Problems:   PAD (peripheral artery disease) (HCC)   Tobacco abuse   GERD (gastroesophageal reflux disease)   Alcohol abuse   Hx of seizure disorder   Hx of atrial flutter   HTN (hypertension)    Plan: Discharge home today    LOS: 4 days   Fredirick Maudlin 04/16/2018, 8:58 AM

## 2018-05-24 ENCOUNTER — Other Ambulatory Visit: Payer: Self-pay | Admitting: Neurology

## 2018-05-25 ENCOUNTER — Ambulatory Visit (INDEPENDENT_AMBULATORY_CARE_PROVIDER_SITE_OTHER)
Admission: RE | Admit: 2018-05-25 | Discharge: 2018-05-25 | Disposition: A | Discharge status: Home / Self Care | Payer: Medicaid Other | Source: Ambulatory Visit | Attending: Vascular Surgery | Admitting: Vascular Surgery

## 2018-05-25 ENCOUNTER — Ambulatory Visit (INDEPENDENT_AMBULATORY_CARE_PROVIDER_SITE_OTHER): Payer: Medicaid Other | Admitting: Vascular Surgery

## 2018-05-25 ENCOUNTER — Ambulatory Visit (HOSPITAL_COMMUNITY)
Admission: RE | Admit: 2018-05-25 | Discharge: 2018-05-25 | Disposition: A | Discharge status: Home / Self Care | Payer: Medicaid Other | Source: Ambulatory Visit | Attending: Vascular Surgery | Admitting: Vascular Surgery

## 2018-05-25 ENCOUNTER — Encounter: Payer: Self-pay | Admitting: Vascular Surgery

## 2018-05-25 ENCOUNTER — Other Ambulatory Visit: Payer: Self-pay

## 2018-05-25 VITALS — BP 137/80 | HR 57 | Temp 97.3°F | Resp 20 | Ht 70.0 in | Wt 143.0 lb

## 2018-05-25 DIAGNOSIS — I739 Peripheral vascular disease, unspecified: Secondary | ICD-10-CM

## 2018-05-25 DIAGNOSIS — I779 Disorder of arteries and arterioles, unspecified: Secondary | ICD-10-CM | POA: Insufficient documentation

## 2018-05-25 DIAGNOSIS — Z95828 Presence of other vascular implants and grafts: Secondary | ICD-10-CM

## 2018-05-25 NOTE — Progress Notes (Signed)
Patient name: Brady Bell MRN: [id number] DOB: Dec 20, 1955 Sex: male  REASON FOR VISIT: 3 month follow-up for bypass graft surveillance  OP History: 1. L iliofem EA w/ BPA (04/28/17) 2. L CFA to AT bypass with NR ips GSV (09/06/11)  HPI: Brady Bell is a 63 y.o. male the presents for interval 56-month follow-up of his left lower extremity bypass.  He initially underwent a left common femoral to AT bypass with non-reversed ipsilateral great saphenous vein in 2013 by Dr. Imogene Burn.  Most recently underwent left iliofemoral endarterectomy in January 2019.  He reports no new issues.  No rest pain or claudication.  We were watching stenosis in inflow artery of bypass.  Cut back on smoking from 1-2 PPD to 4-5 cigarettes.  Past Medical History:  Diagnosis Date  . Chronic ankle pain   . Chronic knee pain   . GERD (gastroesophageal reflux disease)   . Hepatitis    Hep A in 1980's  . History of atrial flutter    Documented April/May 2019 in the setting of pancreatitis  . Hypoglycemia   . Migraines   . Pancreatitis   . Peripheral vascular disease (HCC)   . Recurrent sinus infections   . Seizures (HCC)   . Shingles     Past Surgical History:  Procedure Laterality Date  . ABDOMINAL AORTOGRAM N/A 04/23/2017   Procedure: ABDOMINAL AORTOGRAM;  Surgeon: Fransisco Hertz, MD;  Location[facility] INV[facility]IVE CV LAB;  Service: Cardiovascular;  Laterality: N/A;  . CHOLECYSTECTOMY N/A 09/23/2017   Procedure: LAPAROSCOPIC CHOLECYSTECTOMY;  Surgeon: Franky Macho, MD;  Location: [facility] ORS;  Service: General;  Laterality: N/A;  . ENDARTERECTOMY FEMORAL Left 04/28/2017   Procedure: ENDARTERECTOMY LEFT ILIO-FEMORAL ARTERY;  Surgeon: Fransisco Hertz, MD;  Location: [facility] OR;  Service: Vascular;  Laterality: Left;  . FASCIOTOMY  09/06/2011   Procedure: FASCIOTOMY;  Surgeon: Fransisco Hertz, MD;  Location: [facility] OR;  Service: Vascular;  Laterality: Left;  . FEMORAL-TIBIAL BYPASS GRAFT  09/06/2011   Procedure: BYPASS GRAFT FEMORAL-TIBIAL  ARTERY;  Surgeon: Fransisco Hertz, MD;  Location: [facility] OR;  Service: Vascular;  Laterality: Left;  . KNEE CARTILAGE SURGERY Left   . LOWER EXTREMITY ANGIOGRAM N/A 09/05/2011   Procedure: LOWER EXTREMITY ANGIOGRAM;  Surgeon: Sherren Kerns, MD;  Location: [facility] CATH LAB;  Service: Cardiovascular;  Laterality: N/A;  . LOWER EXTREMITY ANGIOGRAM Left 04/15/2012   Procedure: LOWER EXTREMITY ANGIOGRAM;  Surgeon: Fransisco Hertz, MD;  Location: [facility] CATH LAB;  Service: Cardiovascular;  Laterality: Left;  . LOWER EXTREMITY ANGIOGRAPHY Bilateral 04/23/2017   Procedure: Lower Extremity Angiography;  Surgeon: Fransisco Hertz, MD;  Location: [facility] INVASIVE CV LAB;  Service: Cardiovascular;  Laterality: Bilateral;  . PATCH ANGIOPLASTY Left 04/28/2017   Procedure: ILIO-FEMORAL ARTERY PATCH ANGIOPLASTY USING Livia Snellen BIOLOGIC PATCH;  Surgeon: Fransisco Hertz, MD;  Location: [facility] OR;  Service: Vascular;  Laterality: Left;  . PR VEIN BYPASS GRAFT,AORTO-FEM-POP  09/06/11   Left   . TOOTH EXTRACTION  June-July-Aug. 2015   Several     Family History  Problem Relation Age of Onset  . Alcohol abuse Father   . Kidney failure Father   . Other Mother        "age"  . Diabetes Other     SOCIAL HISTORY: Social History   Tobacco Use  . Smoking status: Current Every Day Smoker    Packs/day: 0.50    Years: 30.00    Pack years: 15.00    Types:  Cigarettes  . Smokeless tobacco: Never Used  . Tobacco comment: pt states that he is trying his best to quit  Substance Use Topics  . Alcohol use: Yes    Alcohol/week: 3.0 standard drinks    Types: 3 Standard drinks or equivalent per week    Comment: Few drinks per day    No Known Allergies  Current Outpatient Medications  Medication Sig Dispense Refill  . aspirin EC 81 MG tablet Take 81 mg by mouth daily.    . cycloSPORINE (RESTASIS) 0.05 % ophthalmic emulsion Place 1 drop into both eyes 2 (two) times daily.    Marland Kitchen diltiazem (CARDIZEM CD) 180 MG 24 hr capsule Take 1 capsule (180 mg  total) by mouth daily. 30 capsule 12  . divalproex (DEPAKOTE ER) 500 MG 24 hr tablet Take one tab at bedtime.  Please call [phone] to schedule yearly follow up. 90 tablet 0  . eletriptan (RELPAX) 40 MG tablet Take 40 mg by mouth every 2 (two) hours as needed for migraine or headache. May repeat in 2 hours if headache persists or recurs.     . fluticasone (FLONASE) 50 MCG/ACT nasal spray Place 2 sprays into both nostrils daily.    Marland Kitchen gabapentin (NEURONTIN) 300 MG capsule Take 300 mg by mouth daily.     Marland Kitchen HYDROcodone-acetaminophen (NORCO/VICODIN) 5-325 MG tablet Take 1 tablet by mouth every 6 (six) hours as needed for moderate pain. 20 tablet 0  . Multiple Vitamin (MULTIVITAMIN WITH MINERALS) TABS tablet Take 1 tablet by mouth daily.    . nicotine (NICODERM CQ - DOSED IN MG/24 HOURS) 21 mg/24hr patch Place 1 patch (21 mg total) onto the skin daily. 28 patch 0  . pantoprazole (PROTONIX) 40 MG tablet Take 1 tablet (40 mg total) by mouth daily before breakfast. 30 tablet 12  . SUMAtriptan (IMITREX) 100 MG tablet Take 1 tablet (100 mg total) by mouth every 2 (two) hours as needed for migraine. May repeat in 2 hours if headache persists or recurs. 12 tablet 11  . thiamine 100 MG tablet Take 1 tablet (100 mg total) by mouth daily.    . traZODone (DESYREL) 50 MG tablet Take 50 mg by mouth at bedtime.     No current facility-administered medications for this visit.     REVIEW OF SYSTEMS:   denotes positive finding,  denotes negative finding Cardiac  Comments:  Chest pain or chest pressure:    Shortness of breath upon exertion:    Short of breath when lying flat:    Irregular heart rhythm:        Vascular    Pain in calf, thigh, or hip brought on by ambulation:    Pain in feet at night that wakes you up from your sleep:     Blood clot in your veins:    Leg swelling:         Pulmonary    Oxygen at home:    Productive cough:     Wheezing:         Neurologic    Sudden weakness in arms or  legs:     Sudden numbness in arms or legs:     Sudden onset of difficulty speaking or slurred speech:    Temporary loss of vision in one eye:     Problems with dizziness:         Gastrointestinal    Blood in stool:     Vomited blood:  Genitourinary    Burning when urinating:     Blood in urine:        Psychiatric    Major depression:         Hematologic    Bleeding problems:    Problems with blood clotting too easily:        Skin    Rashes or ulcers:        Constitutional    Fever or chills:      PHYSICAL EXAM: Vitals:   05/25/18 1158  BP: 137/80  Pulse: (!) 57  Resp: 20  Temp: (!) 97.3 F (36.3 C)  SpO2: 96%  Weight: 143 lb (64.9 kg)  Height: 5\' 10"  (1.778 m)    GENERAL: The patient is a well-nourished male, in no acute distress. The vital signs are documented above. CARDIAC: There is a regular rate and rhythm.  VASCULAR:  Well healed left groin scar. Palpable bilateral femoral pulse Palpable pulse in graft along lateral left leg Palpable left DP pulse in foot No tissue loss in left foot, foot warm Palpable right PT pulse PULMONARY: No respiratory issues. ABDOMEN: Soft and non-tender with normal pitched bowel sounds.  MUSCULOSKELETAL: There are no major deformities or cyanosis. NEUROLOGIC: No focal weakness or paresthesias are detected.   DATA:   I have independently reviewed his noninvasive imaging with an ABI of 1.23 in the right leg and 1.05 on the left.  His bypass graft duplex shows a slightly increased velocity of 225 in the inflow on the left.  Rest of the bypass widely patent.    Assessment/Plan:  Overall Mr. Drewes is doing very well.  He has a palpable left dorsalis pedis pulse in the left foot.  His left femoral to AT bypass remains widely patent.  He has had no increase in the velocity of the inflow artery.  He is asymptomatic from a PAD standpoint.  I will plan to see him back in one year with repeat ABI and left leg arterial  duplex.   Cephus Shelling, MD [facility] Office: [phone] Pager: [phone]  Cephus Shelling

## 2018-06-07 ENCOUNTER — Telehealth: Payer: Self-pay | Admitting: Acute Care

## 2018-06-07 DIAGNOSIS — Z122 Encounter for screening for malignant neoplasm of respiratory organs: Secondary | ICD-10-CM

## 2018-06-07 DIAGNOSIS — F17210 Nicotine dependence, cigarettes, uncomplicated: Principal | ICD-10-CM

## 2018-06-07 NOTE — Telephone Encounter (Signed)
Spoke to pt and scheduled SDMV through the grant.  Approval to do  SDMV here 07/07/18 10:30. CT ordered and will be scheduled at [facility] on 07/08/18 Nothing further needed

## 2018-06-07 NOTE — Telephone Encounter (Signed)
Patient returning phone call from [place] from Friday.

## 2018-06-09 ENCOUNTER — Telehealth: Payer: Self-pay | Admitting: Acute Care

## 2018-06-10 NOTE — Telephone Encounter (Signed)
Spoke with pt.  He has talked to [facility] transportation and they said they can take him to [facility] and CT the same day now.   Synetta Fail, Can you see if you can reschedule his CT at [facility] for the same day as his SDMV with Maralyn Sago and let the pt know?

## 2018-06-10 NOTE — Telephone Encounter (Signed)
I have the patients CT rescheduled to 07/07/2018 @ 12:00pm arrival time 11:40am at [facility] [address] [place]. Patient is aware of the CT appt change and is very thankful for [place] working with him to reschedule to the same day as Sarah's appt

## 2018-07-07 ENCOUNTER — Ambulatory Visit: Payer: Medicaid Other

## 2018-07-07 ENCOUNTER — Encounter: Payer: Medicaid Other | Admitting: Acute Care

## 2018-07-08 ENCOUNTER — Ambulatory Visit: Payer: Medicaid Other

## 2018-09-06 ENCOUNTER — Inpatient Hospital Stay (HOSPITAL_COMMUNITY): Payer: Medicaid Other | Attending: Hematology

## 2018-09-06 ENCOUNTER — Other Ambulatory Visit: Payer: Self-pay

## 2018-09-06 DIAGNOSIS — D751 Secondary polycythemia: Secondary | ICD-10-CM | POA: Diagnosis present

## 2018-09-06 DIAGNOSIS — F17210 Nicotine dependence, cigarettes, uncomplicated: Secondary | ICD-10-CM | POA: Diagnosis not present

## 2018-09-06 DIAGNOSIS — Z79899 Other long term (current) drug therapy: Secondary | ICD-10-CM | POA: Diagnosis not present

## 2018-09-06 DIAGNOSIS — D571 Sickle-cell disease without crisis: Secondary | ICD-10-CM | POA: Insufficient documentation

## 2018-09-06 DIAGNOSIS — Z7982 Long term (current) use of aspirin: Secondary | ICD-10-CM | POA: Diagnosis not present

## 2018-09-06 LAB — COMPREHENSIVE METABOLIC PANEL
ALT: 16 U/L (ref 0–44)
AST: 15 U/L (ref 15–41)
Albumin: 4 g/dL (ref 3.5–5.0)
Alkaline Phosphatase: 62 U/L (ref 38–126)
Anion gap: 10 (ref 5–15)
BUN: 11 mg/dL (ref 6–20)
CO2: 27 mmol/L (ref 22–32)
Calcium: 8.7 mg/dL — ABNORMAL LOW (ref 8.9–10.3)
Chloride: 94 mmol/L — ABNORMAL LOW (ref 98–111)
Creatinine, Ser: 0.75 mg/dL (ref 0.61–1.24)
GFR calc Af Amer: >60 mL/min (ref >60)
GFR calc non Af Amer: >60 mL/min (ref >60)
Glucose, Bld: 92 mg/dL (ref 70–99)
Potassium: 3.4 mmol/L — ABNORMAL LOW (ref 3.5–5.1)
Sodium: 131 mmol/L — ABNORMAL LOW (ref 135–145)
Total Bilirubin: 0.7 mg/dL (ref 0.3–1.2)
Total Protein: 7.1 g/dL (ref 6.5–8.1)

## 2018-09-06 LAB — CBC WITH DIFFERENTIAL/PLATELET
Abs Immature Granulocytes: 0.15 10*3/uL — ABNORMAL HIGH (ref 0.00–0.07)
Basophils Absolute: 0 10*3/uL (ref 0.0–0.1)
Basophils Relative: 0 %
Eosinophils Absolute: 0.1 10*3/uL (ref 0.0–0.5)
Eosinophils Relative: 1 %
HCT: 41.9 % (ref 39.0–52.0)
Hemoglobin: 14 g/dL (ref 13.0–17.0)
Immature Granulocytes: 2 %
Lymphocytes Relative: 24 %
Lymphs Abs: 2.2 10*3/uL (ref 0.7–4.0)
MCH: 32.8 pg (ref 26.0–34.0)
MCHC: 33.4 g/dL (ref 30.0–36.0)
MCV: 98.1 fL (ref 80.0–100.0)
Monocytes Absolute: 0.9 10*3/uL (ref 0.1–1.0)
Monocytes Relative: 9 %
Neutro Abs: 5.8 10*3/uL (ref 1.7–7.7)
Neutrophils Relative %: 64 %
Platelets: 267 10*3/uL (ref 150–400)
RBC: 4.27 MIL/uL (ref 4.22–5.81)
RDW: 12.6 % (ref 11.5–15.5)
WBC: 9.1 10*3/uL (ref 4.0–10.5)
nRBC: 0 % (ref 0.0–0.2)

## 2018-09-13 ENCOUNTER — Other Ambulatory Visit: Payer: Self-pay

## 2018-09-13 ENCOUNTER — Ambulatory Visit (HOSPITAL_COMMUNITY): Payer: Medicaid Other | Admitting: Hematology

## 2018-09-14 ENCOUNTER — Encounter (HOSPITAL_COMMUNITY): Payer: Self-pay | Admitting: Hematology

## 2018-09-14 ENCOUNTER — Inpatient Hospital Stay (HOSPITAL_BASED_OUTPATIENT_CLINIC_OR_DEPARTMENT_OTHER): Payer: Medicaid Other | Admitting: Hematology

## 2018-09-14 VITALS — BP 139/84 | HR 93 | Temp 98.3°F | Resp 16 | Wt 145.4 lb

## 2018-09-14 DIAGNOSIS — D571 Sickle-cell disease without crisis: Secondary | ICD-10-CM | POA: Diagnosis not present

## 2018-09-14 DIAGNOSIS — D751 Secondary polycythemia: Secondary | ICD-10-CM | POA: Diagnosis not present

## 2018-09-14 DIAGNOSIS — Z7982 Long term (current) use of aspirin: Secondary | ICD-10-CM

## 2018-09-14 DIAGNOSIS — Z79899 Other long term (current) drug therapy: Secondary | ICD-10-CM | POA: Diagnosis not present

## 2018-09-14 DIAGNOSIS — F17210 Nicotine dependence, cigarettes, uncomplicated: Secondary | ICD-10-CM | POA: Diagnosis not present

## 2018-09-14 NOTE — Assessment & Plan Note (Signed)
1.  Secondary erythrocytosis: -Evaluation including Jak 2 V6 88F, exon 12-15, MPL W,CALR mutations were negative.  Erythropoietin was 4.2.  Bone marrow biopsy was not done. -Last phlebotomy was in December 2018. -His CBC has been staying stable since more than 6 months. - We reviewed his blood work.  Hemoglobin is in the normal range.  MCV is normal. - He was hospitalized again in January for acute pancreatitis related to alcohol abuse.  He recently had arthroscopic surgery of the left knee for meniscal tear 2 weeks ago. -As his CBC have come back to normal levels, he does not require any further testing at this time. -We will see him back in 6 months with repeat labs.  If they continue to be stable, we will switch him to yearly visits.

## 2018-09-14 NOTE — Patient Instructions (Addendum)
[facility] at [facility] Discharge Instructions  You were seen today by Dr. Katragadda. He went over your recent lab results. He will see you back in 6 months for labs and follow up.   Thank you for choosing [facility] at [facility] to provide your oncology and hematology care.  To afford each patient quality time with our provider, please arrive at least 15 minutes before your scheduled appointment time.   If you have a lab appointment with the Cancer Center please come in thru the  Main Entrance and check in at the main information desk  You need to re-schedule your appointment should you arrive 10 or more minutes late.  We strive to give you quality time with our providers, and arriving late affects you and other patients whose appointments are after yours.  Also, if you no show three or more times for appointments you may be dismissed from the clinic at the providers discretion.     Again, thank you for choosing [facility].  Our hope is that these requests will decrease the amount of time that you wait before being seen by our physicians.       _____________________________________________________________  Should you have questions after your visit to [facility], please contact our office at [phone] between the hours of 8:00 a.m. and 4:30 p.m.  Voicemails left after 4:00 p.m. will not be returned until the following business day.  For prescription refill requests, have your pharmacy contact our office and allow 72 hours.    Cancer Center Support Programs:   > Cancer Support Group  2nd Tuesday of the month 1pm-2pm, Journey Room    

## 2018-09-14 NOTE — Progress Notes (Signed)
Brady Bell, Brady Bell [id number]   CLINIC:  Medical Oncology/Hematology  PCP:  Sinda Du, [place] [address] [phone]   REASON FOR VISIT: Follow-up for Secondary erythrocytosis  CURRENT THERAPY: Intermittent phlebotomies.    INTERVAL HISTORY:  Brady Bell 63 y.o. male returns for follow-up of secondary erythrocytosis.  He reportedly had left knee arthroscopic surgery 2 weeks ago and is recuperating.  He is doing exercises at home.  Appetite is 100%.  Energy levels are 75%.  Had an episode of acute pancreatitis in January for which she was hospitalized.  This is related to EtOH abuse.  Denies any fevers, night sweats or weight loss.  Denies any infections.    REVIEW OF SYSTEMS:  Review of Systems  Psychiatric/Behavioral: Negative for sleep disturbance.  All other systems reviewed and are negative.    PAST MEDICAL/SURGICAL HISTORY:  Past Medical History:  Diagnosis Date  . Chronic ankle pain   . Chronic knee pain   . GERD (gastroesophageal reflux disease)   . Hepatitis    Hep A in 1980's  . History of atrial flutter    Documented April/May 2019 in the setting of pancreatitis  . Hypoglycemia   . Migraines   . Pancreatitis   . Peripheral vascular disease ([place])   . Recurrent sinus infections   . Seizures ([place])   . Shingles    Past Surgical History:  Procedure Laterality Date  . ABDOMINAL AORTOGRAM N/A 04/23/2017   Procedure: ABDOMINAL AORTOGRAM;  Surgeon: Conrad Meridian, MD;  Location: [facility];  Service: Cardiovascular;  Laterality: N/A;  . CHOLECYSTECTOMY N/A 09/23/2017   Procedure: LAPAROSCOPIC CHOLECYSTECTOMY;  Surgeon: Aviva Signs, MD;  Location: [facility];  Service: General;  Laterality: N/A;  . ENDARTERECTOMY FEMORAL Left 04/28/2017   Procedure: ENDARTERECTOMY LEFT ILIO-FEMORAL ARTERY;  Surgeon: Conrad Delaware, MD;  Location: [facility];  Service: Vascular;  Laterality: Left;  . FASCIOTOMY   09/06/2011   Procedure: FASCIOTOMY;  Surgeon: Conrad Provo, MD;  Location: [facility];  Service: Vascular;  Laterality: Left;  . FEMORAL-TIBIAL BYPASS GRAFT  09/06/2011   Procedure: BYPASS GRAFT FEMORAL-TIBIAL ARTERY;  Surgeon: Conrad Clear Lake, MD;  Location: [facility];  Service: Vascular;  Laterality: Left;  . KNEE CARTILAGE SURGERY Left   . LOWER EXTREMITY ANGIOGRAM N/A 09/05/2011   Procedure: LOWER EXTREMITY ANGIOGRAM;  Surgeon: Elam Dutch, MD;  Location[facility];  Service: Cardiovascular;  Laterality: N/A;  . LOWER EXTREMITY ANGIOGRAM Left 04/15/2012   Procedure: LOWER EXTREMITY ANGIOGRAM;  Surgeon: Conrad Minden, MD;  Location: [facility] CATH LAB;  Service: Cardiovascular;  Laterality: Left;  . LOWER EXTREMITY ANGIOGRAPHY Bilateral 04/23/2017   Procedure: Lower Extremity Angiography;  Surgeon: Conrad Liberty, MD;  Location: [facility];  Service: Cardiovascular;  Laterality: Bilateral;  . PATCH ANGIOPLASTY Left 04/28/2017   Procedure: ILIO-FEMORAL ARTERY PATCH ANGIOPLASTY USING Rueben Bash BIOLOGIC PATCH;  Surgeon: Conrad , MD;  Location: [facility];  Service: Vascular;  Laterality: Left;  . PR VEIN BYPASS GRAFT,AORTO-FEM-POP  09/06/11   Left   . TOOTH EXTRACTION  June-July-Aug. 2015   Several      SOCIAL HISTORY:  Social History   Socioeconomic History  . Marital status: Single    Spouse name: Not on file  . Number of children: 0  . Years of education: some college  . Highest education level: Not on file  Occupational History  . Occupation: Disabled  Social Needs  . Financial resource strain: Not on file  . Food insecurity    Worry: Not on file    Inability: Not on file  . Transportation needs    Medical: Not on file    Non-medical: Not on file  Tobacco Use  . Smoking status: Current Every Day Smoker    Packs/day: 0.50    Years: 30.00    Pack years: 15.00    Types: Cigarettes  . Smokeless tobacco: Never Used  . Tobacco comment: pt states that he is trying his best to quit   Substance and Sexual Activity  . Alcohol use: Yes    Alcohol/week: 3.0 standard drinks    Types: 3 Standard drinks or equivalent per week    Comment: Few drinks per day  . Drug use: Yes    Types: Marijuana    Comment: occasionally  . Sexual activity: Yes  Lifestyle  . Physical activity    Days per week: Not on file    Minutes per session: Not on file  . Stress: Not on file  Relationships  . Social Herbalist on phone: Not on file    Gets together: Not on file    Attends religious service: Not on file    Active member of club or organization: Not on file    Attends meetings of clubs or organizations: Not on file    Relationship status: Not on file  . Intimate partner violence    Fear of current or ex partner: Not on file    Emotionally abused: Not on file    Physically abused: Not on file    Forced sexual activity: Not on file  Other Topics Concern  . Not on file  Social History Narrative   Lives alone.   Left-handed.   2-3 cups caffeine per day.    FAMILY HISTORY:  Family History  Problem Relation Age of Onset  . Alcohol abuse Father   . Kidney failure Father   . Other Mother        "age"  . Diabetes Other     CURRENT MEDICATIONS:  Outpatient Encounter Medications as of 09/14/2018  Medication Sig  . aspirin EC 81 MG tablet Take 81 mg by mouth daily.  . cycloSPORINE (RESTASIS) 0.05 % ophthalmic emulsion Place 1 drop into both eyes 2 (two) times daily.  Marland Kitchen diltiazem (CARDIZEM CD) 180 MG 24 hr capsule Take 1 capsule (180 mg total) by mouth daily.  . divalproex (DEPAKOTE ER) 500 MG 24 hr tablet Take one tab at bedtime.  Please call [phone] to schedule yearly follow up.  . eletriptan (RELPAX) 40 MG tablet Take 40 mg by mouth every 2 (two) hours as needed for migraine or headache. May repeat in 2 hours if headache persists or recurs.   . fluticasone (FLONASE) 50 MCG/ACT nasal spray Place 2 sprays into both nostrils daily.  Marland Kitchen gabapentin (NEURONTIN) 300 MG  capsule Take 300 mg by mouth daily.   Marland Kitchen HYDROcodone-acetaminophen (NORCO/VICODIN) 5-325 MG tablet Take 1 tablet by mouth every 6 (six) hours as needed for moderate pain.  . Multiple Vitamin (MULTIVITAMIN WITH MINERALS) TABS tablet Take 1 tablet by mouth daily.  . nicotine (NICODERM CQ - DOSED IN MG/24 HOURS) 21 mg/24hr patch Place 1 patch (21 mg total) onto the skin daily.  . pantoprazole (PROTONIX) 40 MG tablet Take 1 tablet (40 mg total) by mouth daily before breakfast.  . SUMAtriptan (IMITREX) 100 MG tablet Take 1 tablet (  100 mg total) by mouth every 2 (two) hours as needed for migraine. May repeat in 2 hours if headache persists or recurs.  . thiamine 100 MG tablet Take 1 tablet (100 mg total) by mouth daily.  . traZODone (DESYREL) 50 MG tablet Take 50 mg by mouth at bedtime.   No facility-administered encounter medications on file as of 09/14/2018.     ALLERGIES:  No Known Allergies   PHYSICAL EXAM:  ECOG Performance status: 1  Vitals:   09/14/18 1100  BP: 139/84  Pulse: 93  Resp: 16  Temp: 98.3 F (36.8 C)  SpO2: 97%   Filed Weights   09/14/18 1100  Weight: 145 lb 6 oz (65.9 kg)    Physical Exam Cardiovascular:     Rate and Rhythm: Normal rate and regular rhythm.     Heart sounds: Normal heart sounds.  Pulmonary:     Effort: Pulmonary effort is normal.     Breath sounds: Normal breath sounds.  Abdominal:     General: Abdomen is flat. There is no distension.     Palpations: Abdomen is soft. There is no mass.  Musculoskeletal: Normal range of motion.  Skin:    General: Skin is warm.  Neurological:     Mental Status: He is alert and oriented to person, place, and time. Mental status is at baseline.  Psychiatric:        Mood and Affect: Mood normal.        Behavior: Behavior normal.      LABORATORY DATA:  I have reviewed the labs as listed.  CBC    Component Value Date/Time   WBC 9.1 09/06/2018 1251   RBC 4.27 09/06/2018 1251   HGB 14.0 09/06/2018 1251    HGB 20.2 (HH) 12/08/2016 1049   HCT 41.9 09/06/2018 1251   HCT 58.3 (H) 12/08/2016 1049   PLT 267 09/06/2018 1251   PLT 168 12/08/2016 1049   MCV 98.1 09/06/2018 1251   MCV 101 (H) 12/08/2016 1049   MCH 32.8 09/06/2018 1251   MCHC 33.4 09/06/2018 1251   RDW 12.6 09/06/2018 1251   RDW 14.2 12/08/2016 1049   LYMPHSABS 2.2 09/06/2018 1251   MONOABS 0.9 09/06/2018 1251   EOSABS 0.1 09/06/2018 1251   BASOSABS 0.0 09/06/2018 1251   CMP Latest Ref Rng & Units 09/06/2018 04/15/2018 04/14/2018  Glucose 70 - 99 mg/dL 92 98 86  BUN 6 - 20 mg/dL 11 7 16   Creatinine 0.61 - 1.24 mg/dL 0.75 0.55(L) 0.61  Sodium 135 - 145 mmol/L 131(L) 133(L) 130(L)  Potassium 3.5 - 5.1 mmol/L 3.4(L) 3.7 3.7  Chloride 98 - 111 mmol/L 94(L) 99 99  CO2 22 - 32 mmol/L 27 26 23   Calcium 8.9 - 10.3 mg/dL 8.7(L) 8.3(L) 8.1(L)  Total Protein 6.5 - 8.1 g/dL 7.1 - -  Total Bilirubin 0.3 - 1.2 mg/dL 0.7 - -  Alkaline Phos 38 - 126 U/L 62 - -  AST 15 - 41 U/L 15 - -  ALT 0 - 44 U/L 16 - -       DIAGNOSTIC IMAGING:  I have independently reviewed the scans and discussed with the patient.      ASSESSMENT & PLAN:   Erythrocytosis 1.  Secondary erythrocytosis: -Evaluation including Jak 2 V6 33F, exon 12-15, MPL W,CALR mutations were negative.  Erythropoietin was 4.2.  Bone marrow biopsy was not done. -Last phlebotomy was in December 2018. -His CBC has been staying stable since more than 6 months. - We  reviewed his blood work.  Hemoglobin is in the normal range.  MCV is normal. - He was hospitalized again in January for acute pancreatitis related to alcohol abuse.  He recently had arthroscopic surgery of the left knee for meniscal tear 2 weeks ago. -As his CBC have come back to normal levels, he does not require any further testing at this time. -We will see him back in 6 months with repeat labs.  If they continue to be stable, we will switch him to yearly visits.      Orders placed this encounter:  Orders  Placed This Encounter  Procedures  . CBC with Differential/Platelet  . Comprehensive metabolic panel      Derek Jack, MD [place] [phone]

## 2018-09-30 ENCOUNTER — Encounter (HOSPITAL_COMMUNITY): Payer: Self-pay | Admitting: Emergency Medicine

## 2018-09-30 ENCOUNTER — Other Ambulatory Visit: Payer: Self-pay

## 2018-09-30 ENCOUNTER — Emergency Department (HOSPITAL_COMMUNITY): Payer: Medicaid Other

## 2018-09-30 ENCOUNTER — Inpatient Hospital Stay (HOSPITAL_COMMUNITY)
Admission: EM | Admit: 2018-09-30 | Discharge: 2018-10-04 | DRG: 439 | Disposition: A | Discharge status: Home / Self Care | Payer: Medicaid Other | Attending: Internal Medicine | Admitting: Internal Medicine

## 2018-09-30 DIAGNOSIS — Z79899 Other long term (current) drug therapy: Secondary | ICD-10-CM | POA: Diagnosis not present

## 2018-09-30 DIAGNOSIS — G43909 Migraine, unspecified, not intractable, without status migrainosus: Secondary | ICD-10-CM | POA: Diagnosis present

## 2018-09-30 DIAGNOSIS — Z79891 Long term (current) use of opiate analgesic: Secondary | ICD-10-CM | POA: Diagnosis not present

## 2018-09-30 DIAGNOSIS — I739 Peripheral vascular disease, unspecified: Secondary | ICD-10-CM | POA: Diagnosis present

## 2018-09-30 DIAGNOSIS — G8929 Other chronic pain: Secondary | ICD-10-CM | POA: Diagnosis present

## 2018-09-30 DIAGNOSIS — Z7951 Long term (current) use of inhaled steroids: Secondary | ICD-10-CM

## 2018-09-30 DIAGNOSIS — Z9049 Acquired absence of other specified parts of digestive tract: Secondary | ICD-10-CM | POA: Diagnosis not present

## 2018-09-30 DIAGNOSIS — J449 Chronic obstructive pulmonary disease, unspecified: Secondary | ICD-10-CM | POA: Diagnosis present

## 2018-09-30 DIAGNOSIS — I471 Supraventricular tachycardia: Secondary | ICD-10-CM | POA: Diagnosis not present

## 2018-09-30 DIAGNOSIS — Z8679 Personal history of other diseases of the circulatory system: Secondary | ICD-10-CM

## 2018-09-30 DIAGNOSIS — F17210 Nicotine dependence, cigarettes, uncomplicated: Secondary | ICD-10-CM | POA: Diagnosis present

## 2018-09-30 DIAGNOSIS — G629 Polyneuropathy, unspecified: Secondary | ICD-10-CM | POA: Diagnosis present

## 2018-09-30 DIAGNOSIS — F10239 Alcohol dependence with withdrawal, unspecified: Secondary | ICD-10-CM | POA: Diagnosis not present

## 2018-09-30 DIAGNOSIS — Z811 Family history of alcohol abuse and dependence: Secondary | ICD-10-CM

## 2018-09-30 DIAGNOSIS — K852 Alcohol induced acute pancreatitis without necrosis or infection: Secondary | ICD-10-CM | POA: Diagnosis present

## 2018-09-30 DIAGNOSIS — K7010 Alcoholic hepatitis without ascites: Secondary | ICD-10-CM | POA: Diagnosis present

## 2018-09-30 DIAGNOSIS — F10230 Alcohol dependence with withdrawal, uncomplicated: Secondary | ICD-10-CM | POA: Diagnosis not present

## 2018-09-30 DIAGNOSIS — Z20828 Contact with and (suspected) exposure to other viral communicable diseases: Secondary | ICD-10-CM | POA: Diagnosis present

## 2018-09-30 DIAGNOSIS — Z7982 Long term (current) use of aspirin: Secondary | ICD-10-CM

## 2018-09-30 DIAGNOSIS — G40909 Epilepsy, unspecified, not intractable, without status epilepticus: Secondary | ICD-10-CM | POA: Diagnosis present

## 2018-09-30 DIAGNOSIS — R7989 Other specified abnormal findings of blood chemistry: Secondary | ICD-10-CM | POA: Diagnosis present

## 2018-09-30 DIAGNOSIS — I4892 Unspecified atrial flutter: Secondary | ICD-10-CM | POA: Diagnosis present

## 2018-09-30 DIAGNOSIS — Z716 Tobacco abuse counseling: Secondary | ICD-10-CM | POA: Diagnosis not present

## 2018-09-30 DIAGNOSIS — K219 Gastro-esophageal reflux disease without esophagitis: Secondary | ICD-10-CM | POA: Diagnosis present

## 2018-09-30 DIAGNOSIS — F10939 Alcohol use, unspecified with withdrawal, unspecified: Secondary | ICD-10-CM

## 2018-09-30 DIAGNOSIS — I10 Essential (primary) hypertension: Secondary | ICD-10-CM | POA: Diagnosis present

## 2018-09-30 DIAGNOSIS — D751 Secondary polycythemia: Secondary | ICD-10-CM | POA: Diagnosis present

## 2018-09-30 DIAGNOSIS — G43709 Chronic migraine without aura, not intractable, without status migrainosus: Secondary | ICD-10-CM | POA: Diagnosis present

## 2018-09-30 DIAGNOSIS — Z720 Tobacco use: Secondary | ICD-10-CM | POA: Diagnosis present

## 2018-09-30 DIAGNOSIS — K859 Acute pancreatitis without necrosis or infection, unspecified: Secondary | ICD-10-CM

## 2018-09-30 DIAGNOSIS — IMO0002 Reserved for concepts with insufficient information to code with codable children: Secondary | ICD-10-CM | POA: Diagnosis present

## 2018-09-30 HISTORY — DX: Chronic obstructive pulmonary disease, unspecified: J44.9

## 2018-09-30 LAB — COMPREHENSIVE METABOLIC PANEL
ALT: 18 U/L (ref 0–44)
AST: 25 U/L (ref 15–41)
Albumin: 4.5 g/dL (ref 3.5–5.0)
Alkaline Phosphatase: 97 U/L (ref 38–126)
Anion gap: 18 — ABNORMAL HIGH (ref 5–15)
BUN: 9 mg/dL (ref 6–20)
CO2: 21 mmol/L — ABNORMAL LOW (ref 22–32)
Calcium: 9.4 mg/dL (ref 8.9–10.3)
Chloride: 96 mmol/L — ABNORMAL LOW (ref 98–111)
Creatinine, Ser: 0.73 mg/dL (ref 0.61–1.24)
GFR calc Af Amer: >60 mL/min (ref >60)
GFR calc non Af Amer: >60 mL/min (ref >60)
Glucose, Bld: 124 mg/dL — ABNORMAL HIGH (ref 70–99)
Potassium: 3.5 mmol/L (ref 3.5–5.1)
Sodium: 135 mmol/L (ref 135–145)
Total Bilirubin: 1.3 mg/dL — ABNORMAL HIGH (ref 0.3–1.2)
Total Protein: 7.9 g/dL (ref 6.5–8.1)

## 2018-09-30 LAB — URINALYSIS, ROUTINE W REFLEX MICROSCOPIC
Bacteria, UA: NONE SEEN
Bilirubin Urine: NEGATIVE
Glucose, UA: NEGATIVE mg/dL
Hgb urine dipstick: NEGATIVE
Ketones, ur: 20 mg/dL — AB
Leukocytes,Ua: NEGATIVE
Nitrite: NEGATIVE
Protein, ur: 30 mg/dL — AB
Specific Gravity, Urine: 1.016 (ref 1.005–1.030)
pH: 8 (ref 5.0–8.0)

## 2018-09-30 LAB — CBC WITH DIFFERENTIAL/PLATELET
Abs Immature Granulocytes: 0.04 10*3/uL (ref 0.00–0.07)
Basophils Absolute: 0.1 10*3/uL (ref 0.0–0.1)
Basophils Relative: 1 %
Eosinophils Absolute: 0.1 10*3/uL (ref 0.0–0.5)
Eosinophils Relative: 1 %
HCT: 41.7 % (ref 39.0–52.0)
Hemoglobin: 14.3 g/dL (ref 13.0–17.0)
Immature Granulocytes: 0 %
Lymphocytes Relative: 6 %
Lymphs Abs: 0.8 10*3/uL (ref 0.7–4.0)
MCH: 32.6 pg (ref 26.0–34.0)
MCHC: 34.3 g/dL (ref 30.0–36.0)
MCV: 95.2 fL (ref 80.0–100.0)
Monocytes Absolute: 1.2 10*3/uL — ABNORMAL HIGH (ref 0.1–1.0)
Monocytes Relative: 11 %
Neutro Abs: 9.6 10*3/uL — ABNORMAL HIGH (ref 1.7–7.7)
Neutrophils Relative %: 81 %
Platelets: 280 10*3/uL (ref 150–400)
RBC: 4.38 MIL/uL (ref 4.22–5.81)
RDW: 12.1 % (ref 11.5–15.5)
WBC: 11.7 10*3/uL — ABNORMAL HIGH (ref 4.0–10.5)
nRBC: 0 % (ref 0.0–0.2)

## 2018-09-30 LAB — LIPASE, BLOOD: Lipase: 206 U/L — ABNORMAL HIGH (ref 11–51)

## 2018-09-30 LAB — LACTATE DEHYDROGENASE: LDH: 188 U/L (ref 98–192)

## 2018-09-30 LAB — LACTIC ACID, PLASMA: Lactic Acid, Venous: 1.1 mmol/L (ref 0.5–1.9)

## 2018-09-30 LAB — I-STAT CREATININE, ED: Creatinine, Ser: 0.8 mg/dL (ref 0.61–1.24)

## 2018-09-30 LAB — SARS CORONAVIRUS 2 BY RT PCR (HOSPITAL ORDER, PERFORMED IN ~~LOC~~ HOSPITAL LAB): SARS Coronavirus 2: NEGATIVE

## 2018-09-30 MED ORDER — PIPERACILLIN-TAZOBACTAM 3.375 G IVPB 30 MIN
3.3750 g | Freq: Once | INTRAVENOUS | Status: AC
  Administered 2018-09-30: 3.375 g via INTRAVENOUS
  Filled 2018-09-30: qty 50

## 2018-09-30 MED ORDER — PANTOPRAZOLE SODIUM 40 MG PO TBEC
40.0000 mg | DELAYED_RELEASE_TABLET | Freq: Every day | ORAL | Status: DC
  Administered 2018-10-01 – 2018-10-04 (×4): 40 mg via ORAL
  Filled 2018-09-30 (×5): qty 1

## 2018-09-30 MED ORDER — IOHEXOL 350 MG/ML SOLN
100.0000 mL | Freq: Once | INTRAVENOUS | Status: AC | PRN
  Administered 2018-09-30: 100 mL via INTRAVENOUS

## 2018-09-30 MED ORDER — HYDROMORPHONE HCL 1 MG/ML IJ SOLN
1.0000 mg | Freq: Once | INTRAMUSCULAR | Status: AC
  Administered 2018-09-30: 16:00:00 1 mg via INTRAVENOUS
  Filled 2018-09-30: qty 1

## 2018-09-30 MED ORDER — HYDROMORPHONE HCL 1 MG/ML IJ SOLN
1.0000 mg | INTRAMUSCULAR | Status: DC | PRN
  Administered 2018-09-30: 1 mg via INTRAVENOUS
  Filled 2018-09-30: qty 1

## 2018-09-30 MED ORDER — HYDROMORPHONE HCL 1 MG/ML IJ SOLN
0.5000 mg | Freq: Once | INTRAMUSCULAR | Status: AC
  Administered 2018-09-30: 0.5 mg via INTRAVENOUS
  Filled 2018-09-30: qty 1

## 2018-09-30 MED ORDER — GABAPENTIN 300 MG PO CAPS
300.0000 mg | ORAL_CAPSULE | Freq: Every day | ORAL | Status: DC
  Administered 2018-10-01 – 2018-10-04 (×4): 300 mg via ORAL
  Filled 2018-09-30 (×4): qty 1

## 2018-09-30 MED ORDER — ACETAMINOPHEN 325 MG PO TABS
650.0000 mg | ORAL_TABLET | Freq: Four times a day (QID) | ORAL | Status: DC | PRN
  Administered 2018-10-02 – 2018-10-03 (×2): 650 mg via ORAL
  Filled 2018-09-30 (×2): qty 2

## 2018-09-30 MED ORDER — THIAMINE HCL 100 MG/ML IJ SOLN
100.0000 mg | Freq: Every day | INTRAMUSCULAR | Status: DC
  Administered 2018-10-01: 100 mg via INTRAVENOUS
  Filled 2018-09-30 (×3): qty 2

## 2018-09-30 MED ORDER — CHLORDIAZEPOXIDE HCL 25 MG PO CAPS: 25.0000 mg | ORAL_CAPSULE | Freq: Once | ORAL | Status: DC

## 2018-09-30 MED ORDER — ACETAMINOPHEN 650 MG RE SUPP: 650.0000 mg | Freq: Four times a day (QID) | RECTAL | Status: DC | PRN

## 2018-09-30 MED ORDER — HYDROMORPHONE HCL 1 MG/ML IJ SOLN
1.0000 mg | Freq: Once | INTRAMUSCULAR | Status: AC
  Administered 2018-09-30: 1 mg via INTRAVENOUS
  Filled 2018-09-30: qty 1

## 2018-09-30 MED ORDER — TRAZODONE HCL 50 MG PO TABS
50.0000 mg | ORAL_TABLET | Freq: Every evening | ORAL | Status: DC | PRN
  Administered 2018-10-01 – 2018-10-03 (×2): 50 mg via ORAL
  Filled 2018-09-30 (×2): qty 1

## 2018-09-30 MED ORDER — SODIUM CHLORIDE 0.9 % IV SOLN
INTRAVENOUS | Status: AC
  Administered 2018-09-30 – 2018-10-01 (×3): via INTRAVENOUS

## 2018-09-30 MED ORDER — ASPIRIN EC 81 MG PO TBEC
81.0000 mg | DELAYED_RELEASE_TABLET | Freq: Every day | ORAL | Status: DC
  Administered 2018-10-01 – 2018-10-04 (×4): 81 mg via ORAL
  Filled 2018-09-30 (×4): qty 1

## 2018-09-30 MED ORDER — DIVALPROEX SODIUM ER 500 MG PO TB24
500.0000 mg | ORAL_TABLET | Freq: Every day | ORAL | Status: DC
  Administered 2018-10-01 – 2018-10-03 (×4): 500 mg via ORAL
  Filled 2018-09-30 (×4): qty 1

## 2018-09-30 MED ORDER — ELETRIPTAN HYDROBROMIDE 40 MG PO TABS
40.0000 mg | ORAL_TABLET | ORAL | Status: DC | PRN
  Filled 2018-09-30: qty 1

## 2018-09-30 MED ORDER — LORAZEPAM 2 MG/ML IJ SOLN
1.0000 mg | Freq: Four times a day (QID) | INTRAMUSCULAR | Status: AC | PRN
  Administered 2018-10-01 – 2018-10-02 (×2): 1 mg via INTRAVENOUS
  Filled 2018-09-30 (×2): qty 1

## 2018-09-30 MED ORDER — PIPERACILLIN-TAZOBACTAM 3.375 G IVPB
3.3750 g | Freq: Three times a day (TID) | INTRAVENOUS | Status: DC
  Administered 2018-09-30 – 2018-10-01 (×2): 3.375 g via INTRAVENOUS
  Filled 2018-09-30 (×2): qty 50

## 2018-09-30 MED ORDER — DILTIAZEM HCL ER COATED BEADS 180 MG PO CP24
180.0000 mg | ORAL_CAPSULE | Freq: Every day | ORAL | Status: DC
  Administered 2018-10-01 – 2018-10-04 (×4): 180 mg via ORAL
  Filled 2018-09-30 (×4): qty 1

## 2018-09-30 MED ORDER — ONDANSETRON HCL 4 MG/2ML IJ SOLN
4.0000 mg | Freq: Four times a day (QID) | INTRAMUSCULAR | Status: DC | PRN
  Administered 2018-10-01: 4 mg via INTRAVENOUS
  Filled 2018-09-30: qty 2

## 2018-09-30 MED ORDER — VITAMIN B-1 100 MG PO TABS
100.0000 mg | ORAL_TABLET | Freq: Every day | ORAL | Status: DC
  Administered 2018-10-01 – 2018-10-04 (×4): 100 mg via ORAL
  Filled 2018-09-30 (×4): qty 1

## 2018-09-30 MED ORDER — NICOTINE 14 MG/24HR TD PT24
14.0000 mg | MEDICATED_PATCH | Freq: Every day | TRANSDERMAL | Status: DC | PRN
  Administered 2018-10-01 – 2018-10-03 (×2): 14 mg via TRANSDERMAL
  Filled 2018-09-30 (×6): qty 1

## 2018-09-30 MED ORDER — LORAZEPAM 1 MG PO TABS
1.0000 mg | ORAL_TABLET | Freq: Four times a day (QID) | ORAL | Status: AC | PRN
  Administered 2018-10-01 – 2018-10-03 (×3): 1 mg via ORAL
  Filled 2018-09-30 (×3): qty 1

## 2018-09-30 MED ORDER — ADULT MULTIVITAMIN W/MINERALS CH
1.0000 | ORAL_TABLET | Freq: Every day | ORAL | Status: DC
  Administered 2018-10-01 – 2018-10-04 (×4): 1 via ORAL
  Filled 2018-09-30 (×4): qty 1

## 2018-09-30 MED ORDER — FLUTICASONE PROPIONATE 50 MCG/ACT NA SUSP
2.0000 | Freq: Every day | NASAL | Status: DC
  Administered 2018-10-01 – 2018-10-04 (×4): 2 via NASAL
  Filled 2018-09-30 (×3): qty 16

## 2018-09-30 MED ORDER — ENOXAPARIN SODIUM 40 MG/0.4ML ~~LOC~~ SOLN
40.0000 mg | SUBCUTANEOUS | Status: DC
  Administered 2018-09-30 – 2018-10-03 (×4): 40 mg via SUBCUTANEOUS
  Filled 2018-09-30 (×4): qty 0.4

## 2018-09-30 MED ORDER — CHLORDIAZEPOXIDE HCL 25 MG PO CAPS
50.0000 mg | ORAL_CAPSULE | Freq: Once | ORAL | Status: AC
  Administered 2018-10-01: 50 mg via ORAL
  Filled 2018-09-30: qty 2

## 2018-09-30 MED ORDER — HYDROMORPHONE HCL 1 MG/ML IJ SOLN
1.0000 mg | INTRAMUSCULAR | Status: DC | PRN
  Administered 2018-10-01 – 2018-10-04 (×15): 1 mg via INTRAVENOUS
  Filled 2018-09-30 (×17): qty 1

## 2018-09-30 MED ORDER — LORAZEPAM 2 MG/ML IJ SOLN
0.0000 mg | Freq: Four times a day (QID) | INTRAMUSCULAR | Status: AC
  Administered 2018-10-01: 2 mg via INTRAVENOUS
  Administered 2018-10-02: 1 mg via INTRAVENOUS
  Filled 2018-09-30 (×3): qty 1

## 2018-09-30 MED ORDER — LORAZEPAM 2 MG/ML IJ SOLN
0.0000 mg | Freq: Two times a day (BID) | INTRAMUSCULAR | Status: DC
  Administered 2018-10-04: 2 mg via INTRAVENOUS
  Filled 2018-09-30: qty 1

## 2018-09-30 MED ORDER — CYCLOSPORINE 0.05 % OP EMUL
1.0000 [drp] | Freq: Two times a day (BID) | OPHTHALMIC | Status: DC
  Administered 2018-10-01 – 2018-10-04 (×7): 1 [drp] via OPHTHALMIC
  Filled 2018-09-30 (×7): qty 30

## 2018-09-30 MED ORDER — FOLIC ACID 1 MG PO TABS
1.0000 mg | ORAL_TABLET | Freq: Every day | ORAL | Status: DC
  Administered 2018-10-01 – 2018-10-04 (×5): 1 mg via ORAL
  Filled 2018-09-30 (×5): qty 1

## 2018-09-30 NOTE — ED Triage Notes (Signed)
Patient complaining of left flank pain starting 4 hours ago. Denies dysuria.

## 2018-09-30 NOTE — H&P (Signed)
TRH H&P    Patient Demographics:    Brady Bell, is a 63 y.o. male  MRN: [id number]  DOB - 29-Jun-1955  Admit Date - 09/30/2018  Referring MD/NP/PA: Margarita Mail  Outpatient Primary MD for the patient is Sinda Du, MD Jamey Ripa - neurology Rozann Lesches /  Kate Sable - Cardiology  Patient coming from:  home  Chief complaint- abdominal pain   HPI:    Brady Bell  is a 63 y.o. male, w hypertension, PVD L fem-> anterior tibial artery bypass (09/06/2011) , s/p left iliofemoral endarterectomy and angioplasty 04/28/2017, h/o paroxysmal aflutter dx 07/2017, h/o Afib (chads2vasc=1) with RVR 04/15/2018 , Etoh abuse, Migraines, Seizure do, Gerd, Recurrent pancreatitis, h/o lap cholecystetomy 09/23/2017, tobacco use, apparently presents with c/o abdominal pain starting this morning, periumbilical, right sided.  "sharp" associated with n/v.  Pt notes typically drinks 5-7 mixed drink per day, but drinking more recently.  Pt denies any new medications. Pt denies fever, chills, cp, palp, sob, diarrhea, brbpr, black stool.   In ED,  T 98.3  P 109 R 17 Bp 157/92  Pox 97% on RA  Na 135, K 3.5, Bun 9, Creatinine 0.73 Glucose 124 Ast 25, Alt 18 Alk phos 97, T. Bili 1.3 Lipase 206 (prev 45) LDH 188 Lactic acid 1.1 Wbc 11.7, hgb 14.3, Plt 280  Pt given dilaudid iv in ED for pain control and tolerated this.   Pt will be admitted for acute alcoholic pancreatitis.     Review of systems:    In addition to the HPI above,  No Fever-chills, No Headache, No changes with Vision or hearing, No problems swallowing food or Liquids, No Chest pain, Cough or Shortness of Breath,  No Blood in stool or Urine, No dysuria, No new skin rashes or bruises, No new joints pains-aches,  No new weakness, tingling, numbness in any extremity, No recent weight gain or loss, No polyuria, polydypsia or polyphagia, No  significant Mental Stressors.  All other systems reviewed and are negative.    Past History of the following :    Past Medical History:  Diagnosis Date  . Chronic ankle pain   . Chronic knee pain   . COPD (chronic obstructive pulmonary disease) ([place]) 09/30/2018   on CT chest  . GERD (gastroesophageal reflux disease)   . Hepatitis    Hep A in 1980's  . History of atrial flutter    Documented April/May 2019 in the setting of pancreatitis  . Hypoglycemia   . Migraines   . Pancreatitis   . Peripheral vascular disease ([place])   . Recurrent sinus infections   . Seizures ([place])   . Shingles       Past Surgical History:  Procedure Laterality Date  . ABDOMINAL AORTOGRAM N/A 04/23/2017   Procedure: ABDOMINAL AORTOGRAM;  Surgeon: Conrad Abbeville, MD;  Location: [facility];  Service: Cardiovascular;  Laterality: N/A;  . CHOLECYSTECTOMY N/A 09/23/2017   Procedure: LAPAROSCOPIC CHOLECYSTECTOMY;  Surgeon: Aviva Signs, MD;  Location: [facility];  Service: General;  Laterality: N/A;  .  ENDARTERECTOMY FEMORAL Left 04/28/2017   Procedure: ENDARTERECTOMY LEFT ILIO-FEMORAL ARTERY;  Surgeon: Conrad Bluffs, MD;  Location: [facility];  Service: Vascular;  Laterality: Left;  . FASCIOTOMY  09/06/2011   Procedure: FASCIOTOMY;  Surgeon: Conrad West Allis, MD;  Location: [facility];  Service: Vascular;  Laterality: Left;  . FEMORAL-TIBIAL BYPASS GRAFT  09/06/2011   Procedure: BYPASS GRAFT FEMORAL-TIBIAL ARTERY;  Surgeon: Conrad Bern, MD;  Location: [facility];  Service: Vascular;  Laterality: Left;  . KNEE CARTILAGE SURGERY Left   . LOWER EXTREMITY ANGIOGRAM N/A 09/05/2011   Procedure: LOWER EXTREMITY ANGIOGRAM;  Surgeon: Elam Dutch, MD;  Location: [facility];  Service: Cardiovascular;  Laterality: N/A;  . LOWER EXTREMITY ANGIOGRAM Left 04/15/2012   Procedure: LOWER EXTREMITY ANGIOGRAM;  Surgeon: Conrad Kenton, MD;  Location: [facility];  Service: Cardiovascular;  Laterality: Left;  . LOWER EXTREMITY ANGIOGRAPHY  Bilateral 04/23/2017   Procedure: Lower Extremity Angiography;  Surgeon: Conrad Jarales, MD;  Location: [facility];  Service: Cardiovascular;  Laterality: Bilateral;  . PATCH ANGIOPLASTY Left 04/28/2017   Procedure: ILIO-FEMORAL ARTERY PATCH ANGIOPLASTY USING Rueben Bash BIOLOGIC PATCH;  Surgeon: Conrad Lapeer, MD;  Location: [facility];  Service: Vascular;  Laterality: Left;  . PR VEIN BYPASS GRAFT,AORTO-FEM-POP  09/06/11   Left   . TOOTH EXTRACTION  June-July-Aug. 2015   Several       Social History:      Social History   Tobacco Use  . Smoking status: Current Every Day Smoker    Packs/day: 0.50    Years: 30.00    Pack years: 15.00    Types: Cigarettes  . Smokeless tobacco: Never Used  . Tobacco comment: pt states that he is trying his best to quit  Substance Use Topics  . Alcohol use: Yes    Alcohol/week: 3.0 standard drinks    Types: 3 Standard drinks or equivalent per week    Comment: Few drinks per day       Family History :     Family History  Problem Relation Age of Onset  . Alcohol abuse Father   . Kidney failure Father   . Other Mother        "age"  . Diabetes Other        Home Medications:   Prior to Admission medications   Medication Sig Start Date End Date Taking? Authorizing Provider  aspirin EC 81 MG tablet Take 81 mg by mouth daily.   Yes [provider]  cycloSPORINE (RESTASIS) 0.05 % ophthalmic emulsion Place 1 drop into both eyes 2 (two) times daily.   Yes [provider]  diltiazem (CARDIZEM CD) 180 MG 24 hr capsule Take 1 capsule (180 mg total) by mouth daily. 04/17/18  Yes Sinda Du, MD  divalproex (DEPAKOTE ER) 500 MG 24 hr tablet Take one tab at bedtime.  Please call [phone] to schedule yearly follow up. 02/22/18  Yes Marcial Pacas, MD  eletriptan (RELPAX) 40 MG tablet Take 40 mg by mouth every 2 (two) hours as needed for migraine or headache. May repeat in 2 hours if headache persists or recurs.    Yes [provider]  fluticasone (FLONASE) 50 MCG/ACT nasal spray Place 2 sprays into both nostrils daily.   Yes [provider]  gabapentin (NEURONTIN) 300 MG capsule Take 300 mg by mouth daily.    Yes [provider]  HYDROcodone-acetaminophen (NORCO/VICODIN) 5-325 MG tablet Take 1 tablet by mouth every 6 (six)  hours as needed for moderate pain. 04/16/18  Yes Sinda Du, MD  Multiple Vitamin (MULTIVITAMIN WITH MINERALS) TABS tablet Take 1 tablet by mouth daily.   Yes [provider]  pantoprazole (PROTONIX) 40 MG tablet Take 1 tablet (40 mg total) by mouth daily before breakfast. 08/03/17  Yes Sinda Du, MD  SUMAtriptan (IMITREX) 100 MG tablet Take 1 tablet (100 mg total) by mouth every 2 (two) hours as needed for migraine. May repeat in 2 hours if headache persists or recurs. 12/08/16  Yes Marcial Pacas, MD  thiamine 100 MG tablet Take 1 tablet (100 mg total) by mouth daily. 04/17/18  Yes Sinda Du, MD  traZODone (DESYREL) 50 MG tablet Take 50 mg by mouth at bedtime.   Yes [provider]  nicotine (NICODERM CQ - DOSED IN MG/24 HOURS) 21 mg/24hr patch Place 1 patch (21 mg total) onto the skin daily. Patient not taking: Reported on 09/30/2018 08/03/17   Sinda Du, MD     Allergies:    No Known Allergies   Physical Exam:   Vitals  Blood pressure (!) 164/87, pulse (!) 101, temperature 98.3 F (36.8 C), temperature source Oral, height 5' 10"  (1.778 m), weight 63.5 kg, SpO2 96 %.  1.  General: axoxo3  2. Psychiatric: euthymic  3. Neurologic: cn2-12 intact, reflexes 2+ symmetric, diffuse with no clonus, motor 5/5 in all 4 ext  4. HEENMT:  Anicteric, pupils 1.29m symmetric, direct, consensual, near intact Neck: no jvd, no bruit,   5. Respiratory : CTAB  6. Cardiovascular : Tachy s1, s2, no m/g/r  7. Gastrointestinal:  Abd: soft, slight tenderness, no guarding, no rebound, nd, +bs  8. Skin:  Ext: no c/c/e,  No rash, negative cullens  sign No palmar erythema, no asterixis  9.Musculoskeletal:  Good ROM  No adenoapthy    Data Review:    CBC Recent Labs  Lab 09/30/18 1504  WBC 11.7*  HGB 14.3  HCT 41.7  PLT 280  MCV 95.2  MCH 32.6  MCHC 34.3  RDW 12.1  LYMPHSABS 0.8  MONOABS 1.2*  EOSABS 0.1  BASOSABS 0.1   ------------------------------------------------------------------------------------------------------------------  Results for orders placed or performed during the hospital encounter of 09/30/18 (from the past 48 hour(s))  Comprehensive metabolic panel     Status: Abnormal   Collection Time: 09/30/18  1:58 PM  Result Value Ref Range   Sodium 135 135 - 145 mmol/L   Potassium 3.5 3.5 - 5.1 mmol/L   Chloride 96 (L) 98 - 111 mmol/L   CO2 21 (L) 22 - 32 mmol/L   Glucose, Bld 124 (H) 70 - 99 mg/dL   BUN 9 6 - 20 mg/dL   Creatinine, Ser 0.73 0.61 - 1.24 mg/dL   Calcium 9.4 8.9 - 10.3 mg/dL   Total Protein 7.9 6.5 - 8.1 g/dL   Albumin 4.5 3.5 - 5.0 g/dL   AST 25 15 - 41 U/L   ALT 18 0 - 44 U/L   Alkaline Phosphatase 97 38 - 126 U/L   Total Bilirubin 1.3 (H) 0.3 - 1.2 mg/dL   GFR calc non Af Amer >60 >60 mL/min   GFR calc Af Amer >60 >60 mL/min   Anion gap 18 (H) 5 - 15    Comment: [facility]formed [facility] [address] [place] [id number] Lipase, blood     Status: Abnormal   Collection Time: 09/30/18  1:58 PM  Result Value Ref Range   Lipase 206 (H) 11 - 51  U/L    Comment: Performed at [facility], [address], [place], [place] [address]  Lactic acid, plasma     Status: None   Collection Time: 09/30/18  3:04 PM  Result Value Ref Range   Lactic Acid, Venous 1.1 0.5 - 1.9 mmol/L    Comment: Performed at [facility], [address] [place], [place] [address]  CBC with Differential/Platelet     Status: Abnormal   Collection Time: 09/30/18  3:04 PM  Result Value Ref Range   WBC 11.7 (H) 4.0 - 10.5 K/uL   RBC 4.38 4.22 - 5.81 MIL/uL   Hemoglobin 14.3 13.0 - 17.0 g/dL   HCT  41.7 39.0 - 52.0 %   MCV 95.2 80.0 - 100.0 fL   MCH 32.6 26.0 - 34.0 pg   MCHC 34.3 30.0 - 36.0 g/dL   RDW 12.1 11.5 - 15.5 %   Platelets 280 150 - 400 K/uL   nRBC 0.0 0.0 - 0.2 %   Neutrophils Relative % 81 %   Neutro Abs 9.6 (H) 1.7 - 7.7 K/uL   Lymphocytes Relative 6 %   Lymphs Abs 0.8 0.7 - 4.0 K/uL   Monocytes Relative 11 %   Monocytes Absolute 1.2 (H) 0.1 - 1.0 K/uL   Eosinophils Relative 1 %   Eosinophils Absolute 0.1 0.0 - 0.5 K/uL   Basophils Relative 1 %   Basophils Absolute 0.1 0.0 - 0.1 K/uL   Immature Granulocytes 0 %   Abs Immature Granulocytes 0.04 0.00 - 0.07 K/uL    Comment: Performed at [facility], [address], [place], [place] [address]  SARS Coronavirus 2 (CEPHEID - Performed in [place] hospital lab), Hosp Order     Status: None   Collection Time: 09/30/18  3:09 PM   Specimen: Nasopharyngeal Swab  Result Value Ref Range   SARS Coronavirus 2 NEGATIVE NEGATIVE    Comment: (NOTE) If result is NEGATIVE SARS-CoV-2 target nucleic acids are NOT DETECTED. The SARS-CoV-2 RNA is generally detectable in upper and lower  respiratory specimens during the acute phase of infection. The lowest  concentration of SARS-CoV-2 viral copies this assay can detect is 250  copies / mL. A negative result does not preclude SARS-CoV-2 infection  and should not be used as the sole basis for treatment or other  patient management decisions.  A negative result may occur with  improper specimen collection / handling, submission of specimen other  than nasopharyngeal swab, presence of viral mutation(s) within the  areas targeted by this assay, and inadequate number of viral copies  (<250 copies / mL). A negative result must be combined with clinical  observations, patient history, and epidemiological information. If result is POSITIVE SARS-CoV-2 target nucleic acids are DETECTED. The SARS-CoV-2 RNA is generally detectable in upper and lower  respiratory specimens dur ing the  acute phase of infection.  Positive  results are indicative of active infection with SARS-CoV-2.  Clinical  correlation with patient history and other diagnostic information is  necessary to determine patient infection status.  Positive results do  not rule out bacterial infection or co-infection with other viruses. If result is PRESUMPTIVE POSTIVE SARS-CoV-2 nucleic acids MAY BE PRESENT.   A presumptive positive result was obtained on the submitted specimen  and confirmed on repeat testing.  While 2019 novel coronavirus  (SARS-CoV-2) nucleic acids may be present in the submitted sample  additional confirmatory testing may be necessary for epidemiological  and / or clinical management purposes  to  differentiate between  SARS-CoV-2 and other Sarbecovirus currently known to infect humans.  If clinically indicated additional testing with an alternate test  methodology [phone]) is advised. The SARS-CoV-2 RNA is generally  detectable in upper and lower respiratory sp ecimens during the acute  phase of infection. The expected result is Negative. Fact Sheet for Patients:  StrictlyIdeas.no Fact Sheet for Healthcare Providers: BankingDealers.co.za This test is not yet approved or cleared by the [place] FDA and has been authorized for detection and/or diagnosis of SARS-CoV-2 by FDA under an Emergency Use Authorization (EUA).  This EUA will remain in effect (meaning this test can be used) for the duration of the COVID-19 declaration under Section 564(b)(1) of the Act, 21 U.S.C. section 360bbb-3(b)(1), unless the authorization is terminated or revoked sooner. Performed at [facility], [address], [place], [place] [address]   Urinalysis, Routine w reflex microscopic     Status: Abnormal   Collection Time: 09/30/18  7:38 PM  Result Value Ref Range   Color, Urine YELLOW YELLOW   APPearance CLEAR CLEAR   Specific Gravity, Urine 1.016 1.005  - 1.030   pH 8.0 5.0 - 8.0   Glucose, UA NEGATIVE NEGATIVE mg/dL   Hgb urine dipstick NEGATIVE NEGATIVE   Bilirubin Urine NEGATIVE NEGATIVE   Ketones, ur 20 (A) NEGATIVE mg/dL   Protein, ur 30 (A) NEGATIVE mg/dL   Nitrite NEGATIVE NEGATIVE   Leukocytes,Ua NEGATIVE NEGATIVE   RBC / HPF 0-5 0 - 5 RBC/hpf   WBC, UA 0-5 0 - 5 WBC/hpf   Bacteria, UA NONE SEEN NONE SEEN    Comment: Performed at [facility], [address] [place], [place] [address]  I-stat Creatinine, ED     Status: None   Collection Time: 09/30/18  8:22 PM  Result Value Ref Range   Creatinine, Ser 0.80 0.61 - 1.24 mg/dL    Chemistries  Recent Labs  Lab 09/30/18 1358 09/30/18 2022  NA 135  --   K 3.5  --   CL 96*  --   CO2 21*  --   GLUCOSE 124*  --   BUN 9  --   CREATININE 0.73 0.80  CALCIUM 9.4  --   AST 25  --   ALT 18  --   ALKPHOS 97  --   BILITOT 1.3*  --    ------------------------------------------------------------------------------------------------------------------  ------------------------------------------------------------------------------------------------------------------ GFR: Estimated Creatinine Clearance: 88.2 mL/min (by C-G formula based on SCr of 0.8 mg/dL). Liver Function Tests: Recent Labs  Lab 09/30/18 1358  AST 25  ALT 18  ALKPHOS 97  BILITOT 1.3*  PROT 7.9  ALBUMIN 4.5   Recent Labs  Lab 09/30/18 1358  LIPASE 206*   No results for input(s): AMMONIA in the last 168 hours. Coagulation Profile: No results for input(s): INR, PROTIME in the last 168 hours. Cardiac Enzymes: No results for input(s): CKTOTAL, CKMB, CKMBINDEX, TROPONINI in the last 168 hours. BNP (last 3 results) No results for input(s): PROBNP in the last 8760 hours. HbA1C: No results for input(s): HGBA1C in the last 72 hours. CBG: No results for input(s): GLUCAP in the last 168 hours. Lipid Profile: No results for input(s): CHOL, HDL, LDLCALC, TRIG, CHOLHDL, LDLDIRECT in the last 72 hours.  Thyroid Function Tests: No results for input(s): TSH, T4TOTAL, FREET4, T3FREE, THYROIDAB in the last 72 hours. Anemia Panel: No results for input(s): VITAMINB12, FOLATE, FERRITIN, TIBC, IRON, RETICCTPCT in the last 72 hours.  --------------------------------------------------------------------------------------------------------------- Urine analysis:    Component Value Date/Time  COLORURINE YELLOW 09/30/2018 1938   APPEARANCEUR CLEAR 09/30/2018 1938   LABSPEC 1.016 09/30/2018 1938   PHURINE 8.0 09/30/2018 1938   GLUCOSEU NEGATIVE 09/30/2018 1938   HGBUR NEGATIVE 09/30/2018 1938   BILIRUBINUR NEGATIVE 09/30/2018 1938   KETONESUR 20 (A) 09/30/2018 1938   PROTEINUR 30 (A) 09/30/2018 1938   UROBILINOGEN 0.2 01/05/2015 1316   NITRITE NEGATIVE 09/30/2018 1938   LEUKOCYTESUR NEGATIVE 09/30/2018 1938      Imaging Results:    Ct Angio Chest/abd/pel For Dissection W And/or Wo Contrast  Result Date: 09/30/2018 CLINICAL DATA:  Left flank pain.  History of pancreatitis. EXAM: CT ANGIOGRAPHY CHEST, ABDOMEN AND PELVIS TECHNIQUE: Multidetector CT imaging through the chest, abdomen and pelvis was performed using the standard protocol during bolus administration of intravenous contrast. Multiplanar reconstructed images and MIPs were obtained and reviewed to evaluate the vascular anatomy. CONTRAST:  100 mL OMNIPAQUE IOHEXOL 350 MG/ML SOLN COMPARISON:  CT abdomen pelvis 04/12/2018 FINDINGS: CTA CHEST FINDINGS Cardiovascular: --Heart: The heart size is normal. There is nopericardial effusion. There are coronary artery calcifications. --Aorta: The course and caliber of the thoracic aorta are normal. There is mild aortic atherosclerotic calcification. Precontrast images show no aortic intramural hematoma. There is no blood pool, dissection or penetrating ulcer demonstrated on arterial phase postcontrast imaging. There is a conventional 3 vessel aortic arch branching pattern. The proximal arch vessels are  widely patent. --Pulmonary Arteries: Contrast timing is optimized for preferential opacification of the aorta. Within that limitation, normal central pulmonary arteries. Mediastinum/Nodes: No mediastinal, hilar or axillary lymphadenopathy. The visualized thyroid and thoracic esophageal course are unremarkable. Lungs/Pleura: There is biapical bullous emphysema. No pleural effusion or pneumothorax. No focal airspace consolidation or pulmonary edema. Musculoskeletal: No chest wall abnormality. No acute osseous findings. Review of the MIP images confirms the above findings. CTA ABDOMEN AND PELVIS FINDINGS VASCULAR Aorta: Normal caliber aorta without aneurysm, dissection, vasculitis or hemodynamically significant stenosis. There is moderate aortic atherosclerosis. Celiac: No aneurysm, dissection or hemodynamically significant stenosis. Normal branching pattern. SMA: Widely patent without dissection or stenosis. Renals: Paired right and single left renal arteries. No aneurysm, dissection, stenosis or evidence of fibromuscular dysplasia. IMA: Origin is occluded but the distal arteries patent. Inflow: Moderate atherosclerotic calcification. No aneurysm, dissection or high-grade stenosis. Status post left femoral endarterectomy. Veins: Incidentally noted retro iliac left renal vein Review of the MIP images confirms the above findings. NON-VASCULAR Hepatobiliary: Normal hepatic contours and density. No visible biliary dilatation. Status post cholecystectomy. Pancreas: There is moderate stranding surrounding the pancreatic body and tail. There is no walled-off fluid collection. No evidence of pancreatic necrosis or hemorrhage. Spleen: Multiple calcified granulomata Adrenals/Urinary Tract: --Adrenal glands: Normal. --Right kidney/ureter: No hydronephrosis or perinephric stranding. No nephrolithiasis. No obstructing ureteral stones. --Left kidney/ureter: No hydronephrosis or perinephric stranding. No nephrolithiasis. No  obstructing ureteral stones. --Urinary bladder: Unremarkable. Stomach/Bowel: --Stomach/Duodenum: No hiatal hernia or other gastric abnormality. Normal duodenal course and caliber. --Small bowel: No dilatation or inflammation. --Colon: No focal abnormality. --Appendix: Normal. Lymphatic:  No abdominal or pelvic lymphadenopathy. Reproductive: There are prosthetic calcifications. Musculoskeletal. No bony spinal canal stenosis or focal osseous abnormality. Other: None. Review of the MIP images confirms the above findings. IMPRESSION: 1. No acute aortic syndrome.  Aortic atherosclerosis (ICD10-I70.0). 2. Acute pancreatitis without pseudocyst or defined fluid collection. 3.  Emphysema (ICD10-J43.9). Electronically Signed   By: Ulyses Jarred M.D.   On: 09/30/2018 20:18      Assessment & Plan:    Principal Problem:   Acute pancreatitis  Active Problems:   PVD (peripheral vascular disease) with claudication (HCC)   Chronic migraine   Acute alcoholic pancreatitis   Tobacco abuse   GERD (gastroesophageal reflux disease)   Hx of atrial flutter   HTN (hypertension)  Acute alcoholic pancreatitis Check LDH, Lipid NPO Ns at 130m per hour x 1 day Dilaudid 147miv q4h prn pain Zofran 58m11mv q6h prn n/v Check Lipase in am  H/o Aflutter / Afib (Chads2vasc=1 , Pvd) Problem list included hypertension but patient denies Previously labelled as Chads2-vasc =1 04/16/2018 by SamRozann Leschesnt Aspirin 6m67m qday Cont Cardizem CD 180mg57mqday  PVD Check Lipid , reviewed labs in Epic and no prior lipid dating back to 2009 Cont Aspirin  Gerd Cont PPI  H/o Migraines/ Seizure do ? Cont Depakote ER 500mg 80mhs Cont Relpax prn  Neuropathy Cont Gabapentin 300mg [id number]  ETOH abuse Librium 50mg po758my x 1 days then 25mg po 11m x 1 day CIWA  Tobacco dependence/ abuse Pt counselled on cessation x 3minutes 15motine patch 158mg topic16m qday prn   COPD on CT chest Past medical history updated     DVT Prophylaxis-   Lovenox - SCDs   AM Labs Ordered, also please review Full Orders  Family Communication: Admission, patients condition and plan of care including tests being ordered have been discussed with the patient who indicate understanding and agree with the plan and Code Status.  Code Status:  FULL CODE, notified his friend Craig SummeFlorene Glen request that patient would be admitted for pancreatitis  Admission status: Inpatient: Based on patients clinical presentation and evaluation of above clinical data, I have made determination that patient meets Inpatient criteria at this time. Pt has high risk of clinical deterioration,  Pt is tachycardic with hx of afib.  Pt will require NPO and iv fluids for pancreatitis, has high possibility of etoh withdrawal.   Time spent in minutes : 70 minutes   Raneem Mendolia MJani Gravel/2020 at 9:57 PM

## 2018-09-30 NOTE — ED Provider Notes (Signed)
[facility] EMERGENCY DEPARTMENT Provider Note   CSN: [id number] Arrival date & time: 09/30/18  1300    History   Chief Complaint Chief Complaint  Patient presents with   Flank Pain    HPI Brady Bell is a 63 y.o. male who  has a past medical history of Chronic ankle pain, Chronic knee pain, GERD (gastroesophageal reflux disease),  Alcoholic Hepatitis, History of atrial flutter, Hypoglycemia, Migraines, Pancreatitis, Peripheral vascular disease (HCC), Recurrent sinus infections, Seizures (HCC), chronic alcohol abuse, and secondary polycythemia (erythrocytosis).  Patient presents emergency department chief complaint of left flank and left lower quadrant abdominal pain.  Patient states that when he awoke this morning he became suddenly very nauseous and vomiting.  After that he began having pain in his left lower abdominal quadrant which has progressively worsened is now severe, 10 out of 10, constant.  He denies a colicky quality to the pain.  He denies any urinary symptoms.  He states that his pain is severe with any movement, coughing, deep breathing.  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  He denies upper or lower extremity weakness, paresthesia.  He has a history of previous left femoropopliteal bypass and a cholecystectomy.  He denies fevers or chills.      HPI  Past Medical History:  Diagnosis Date   Chronic ankle pain    Chronic knee pain    COPD (chronic obstructive pulmonary disease) (HCC) 09/30/2018   on CT chest   GERD (gastroesophageal reflux disease)    Hepatitis    Hep A in 1980's   History of atrial flutter    Documented April/May 2019 in the setting of pancreatitis   Hypoglycemia    Migraines    Pancreatitis    Peripheral vascular disease (HCC)    Recurrent sinus infections    Seizures (HCC)    Shingles     Patient Active  Problem List   Diagnosis Date Noted   Pancreatitis, acute 04/12/2018   Tobacco abuse 04/12/2018   GERD (gastroesophageal reflux disease) 04/12/2018   Alcohol abuse 04/12/2018   Hx of seizure disorder 04/12/2018   Hx of atrial flutter 04/12/2018   HTN (hypertension) 04/12/2018   Gallstone pancreatitis    Erythrocytosis 09/10/2017   Calculus of gallbladder with cholecystitis without biliary obstruction    Atrial flutter (HCC) 07/29/2017   Acute gallstone pancreatitis 07/22/2017   Acute alcoholic pancreatitis 07/22/2017   Hypertensive urgency 07/22/2017   Acute pancreatitis 07/22/2017   Accelerated hypertension    Tobacco abuse counseling    PAD (peripheral artery disease) (HCC) 04/28/2017   Chronic migraine 02/11/2017   New daily persistent headache 12/08/2016   Peripheral vascular disease (HCC) 12/08/2016   Pain in joint, lower leg 02/10/2014   Numbness and tingling of left leg 12/16/2013   Cramps of left lower extremity-Leg 12/16/2013   Aftercare following surgery of the circulatory system, NEC 12/16/2013   PVD (peripheral vascular disease) with claudication (HCC) 05/07/2012   Pain in limb 03/26/2012   Pain, lower leg 12/26/2011   Atherosclerosis of native arteries of extremity with intermittent claudication (HCC) 09/26/2011   CLOSED FRACTURE OF UPPER END OF TIBIA 02/27/2010   TEAR MEDIAL MENISCUS 02/27/2010    Past Surgical History:  Procedure Laterality Date   ABDOMINAL AORTOGRAM N/A 04/23/2017   Procedure: ABDOMINAL AORTOGRAM;  Surgeon: Fransisco Hertz, MD;  Location: [facility] INVASIVE  CV LAB;  Service: Cardiovascular;  Laterality: N/A;   CHOLECYSTECTOMY N/A 09/23/2017   Procedure: LAPAROSCOPIC CHOLECYSTECTOMY;  Surgeon: Franky Macho, MD;  Location: [facility] ORS;  Service: General;  Laterality: N/A;   ENDARTERECTOMY FEMORAL Left 04/28/2017   Procedure: ENDARTERECTOMY LEFT ILIO-FEMORAL ARTERY;  Surgeon: Fransisco Hertz, MD;  Location: [facility] OR;  Service:  Vascular;  Laterality: Left;   FASCIOTOMY  09/06/2011   Procedure: FASCIOTOMY;  Surgeon: Fransisco Hertz, MD;  Location: [facility] OR;  Service: Vascular;  Laterality: Left;   FEMORAL-TIBIAL BYPASS GRAFT  09/06/2011   Procedure: BYPASS GRAFT FEMORAL-TIBIAL ARTERY;  Surgeon: Fransisco Hertz, MD;  Location: [facility] OR;  Service: Vascular;  Laterality: Left;   KNEE CARTILAGE SURGERY Left    LOWER EXTREMITY ANGIOGRAM N/A 09/05/2011   Procedure: LOWER EXTREMITY ANGIOGRAM;  Surgeon: Sherren Kerns, MD;  Location: [facility] CATH LAB;  Service: Cardiovascular;  Laterality: N/A;   LOWER EXTREMITY ANGIOGRAM Left 04/15/2012   Procedure: LOWER EXTREMITY ANGIOGRAM;  Surgeon: Fransisco Hertz, MD;  Location: [facility] CATH LAB;  Service: Cardiovascular;  Laterality: Left;   LOWER EXTREMITY ANGIOGRAPHY Bilateral 04/23/2017   Procedure: Lower Extremity Angiography;  Surgeon: Fransisco Hertz, MD;  Location: [facility] INVASIVE CV LAB;  Service: Cardiovascular;  Laterality: Bilateral;   PATCH ANGIOPLASTY Left 04/28/2017   Procedure: ILIO-FEMORAL ARTERY PATCH ANGIOPLASTY USING Livia Snellen BIOLOGIC PATCH;  Surgeon: Fransisco Hertz, MD;  Location: [facility] OR;  Service: Vascular;  Laterality: Left;   PR VEIN BYPASS GRAFT,AORTO-FEM-POP  09/06/11   Left    TOOTH EXTRACTION  June-July-Aug. 2015   Several         Home Medications    Prior to Admission medications   Medication Sig Start Date End Date Taking? Authorizing Provider  aspirin EC 81 MG tablet Take 81 mg by mouth daily.   Yes [provider]  cycloSPORINE (RESTASIS) 0.05 % ophthalmic emulsion Place 1 drop into both eyes 2 (two) times daily.   Yes [provider]  diltiazem (CARDIZEM CD) 180 MG 24 hr capsule Take 1 capsule (180 mg total) by mouth daily. 04/17/18  Yes Kari Baars, MD  divalproex (DEPAKOTE ER) 500 MG 24 hr tablet Take one tab at bedtime.  Please call [phone] to schedule yearly follow up. 02/22/18  Yes Levert Feinstein, MD  eletriptan (RELPAX) 40 MG tablet Take 40 mg by mouth  every 2 (two) hours as needed for migraine or headache. May repeat in 2 hours if headache persists or recurs.    Yes [provider]  fluticasone (FLONASE) 50 MCG/ACT nasal spray Place 2 sprays into both nostrils daily.   Yes [provider]  gabapentin (NEURONTIN) 300 MG capsule Take 300 mg by mouth daily.    Yes [provider]  HYDROcodone-acetaminophen (NORCO/VICODIN) 5-325 MG tablet Take 1 tablet by mouth every 6 (six) hours as needed for moderate pain. 04/16/18  Yes Kari Baars, MD  Multiple Vitamin (MULTIVITAMIN WITH MINERALS) TABS tablet Take 1 tablet by mouth daily.   Yes [provider]  pantoprazole (PROTONIX) 40 MG tablet Take 1 tablet (40 mg total) by mouth daily before breakfast. 08/03/17  Yes Kari Baars, MD  SUMAtriptan (IMITREX) 100 MG tablet Take 1 tablet (100 mg total) by mouth every 2 (two) hours as needed for migraine. May repeat in 2 hours if headache persists or recurs. 12/08/16  Yes Levert Feinstein, MD  thiamine 100 MG tablet Take 1 tablet (100 mg total) by mouth daily. 04/17/18  Yes Kari Baars, MD  traZODone (  DESYREL) 50 MG tablet Take 50 mg by mouth at bedtime.   Yes [provider]  nicotine (NICODERM CQ - DOSED IN MG/24 HOURS) 21 mg/24hr patch Place 1 patch (21 mg total) onto the skin daily. Patient not taking: Reported on 09/30/2018 08/03/17   Kari BaarsHawkins, Edward, MD    Family History Family History  Problem Relation Age of Onset   Alcohol abuse Father    Kidney failure Father    Other Mother        "age"   Diabetes Other     Social History Social History   Tobacco Use   Smoking status: Current Every Day Smoker    Packs/day: 0.50    Years: 30.00    Pack years: 15.00    Types: Cigarettes   Smokeless tobacco: Never Used   Tobacco comment: pt states that he is trying his best to quit  Substance Use Topics   Alcohol use: Yes    Alcohol/week: 3.0 standard drinks    Types: 3 Standard drinks or equivalent per  week    Comment: Few drinks per day   Drug use: Yes    Types: Marijuana    Comment: occasionally     Allergies   Patient has no known allergies.   Review of Systems Review of Systems Ten systems reviewed and are negative for acute change, except as noted in the HPI.   Physical Exam Updated Vital Signs BP (!) 173/116    Pulse 98    Temp 98.1 F (36.7 C) (Oral)    Ht 5\' 10"  (1.778 m)    Wt 63.5 kg    SpO2 96%    BMI 20.09 kg/m   Physical Exam Vitals signs and nursing note reviewed.  Constitutional:      General: He is not in acute distress.    Appearance: He is well-developed. He is not diaphoretic.  HENT:     Head: Normocephalic and atraumatic.  Eyes:     General: No scleral icterus.    Conjunctiva/sclera: Conjunctivae normal.  Neck:     Musculoskeletal: Normal range of motion and neck supple.  Cardiovascular:     Rate and Rhythm: Normal rate and regular rhythm.     Heart sounds: Normal heart sounds.  Pulmonary:     Effort: Pulmonary effort is normal. No respiratory distress.     Breath sounds: Normal breath sounds.  Abdominal:     Palpations: Abdomen is rigid.     Tenderness: There is generalized abdominal tenderness. There is guarding.     Comments: Patient with rigid abdomen.  He is guarding and has exquisite tenderness to even the slightest pressure on his abdomen.  He seems to be worse in the left lower quadrant of the abdomen.  Skin:    General: Skin is warm and dry.  Neurological:     Mental Status: He is alert.  Psychiatric:        Behavior: Behavior normal.      ED Treatments / Results  Labs (all labs ordered are listed, but only abnormal results are displayed) Labs Reviewed  COMPREHENSIVE METABOLIC PANEL - Abnormal; Notable for the following components:      Result Value   Chloride 96 (*)    CO2 21 (*)    Glucose, Bld 124 (*)    Total Bilirubin 1.3 (*)    Anion gap 18 (*)    All other components within normal limits  LIPASE, BLOOD -  Abnormal; Notable for the following components:  Lipase 206 (*)    All other components within normal limits  URINALYSIS, ROUTINE W REFLEX MICROSCOPIC - Abnormal; Notable for the following components:   Ketones, ur 20 (*)    Protein, ur 30 (*)    All other components within normal limits  CBC WITH DIFFERENTIAL/PLATELET - Abnormal; Notable for the following components:   WBC 11.7 (*)    Neutro Abs 9.6 (*)    Monocytes Absolute 1.2 (*)    All other components within normal limits  SARS CORONAVIRUS 2 (HOSPITAL ORDER, PERFORMED IN [facility])  LACTIC ACID, PLASMA  CBC WITH DIFFERENTIAL/PLATELET  COMPREHENSIVE METABOLIC PANEL  CBC  LIPASE, BLOOD  LACTATE DEHYDROGENASE  LIPID PANEL  HIV ANTIBODY (ROUTINE TESTING W REFLEX)  I-STAT CREATININE, ED    EKG None  Radiology Ct Angio Chest/abd/pel For Dissection W And/or Wo Contrast  Result Date: 09/30/2018 CLINICAL DATA:  Left flank pain.  History of pancreatitis. EXAM: CT ANGIOGRAPHY CHEST, ABDOMEN AND PELVIS TECHNIQUE: Multidetector CT imaging through the chest, abdomen and pelvis was performed using the standard protocol during bolus administration of intravenous contrast. Multiplanar reconstructed images and MIPs were obtained and reviewed to evaluate the vascular anatomy. CONTRAST:  100 mL OMNIPAQUE IOHEXOL 350 MG/ML SOLN COMPARISON:  CT abdomen pelvis 04/12/2018 FINDINGS: CTA CHEST FINDINGS Cardiovascular: --Heart: The heart size is normal. There is nopericardial effusion. There are coronary artery calcifications. --Aorta: The course and caliber of the thoracic aorta are normal. There is mild aortic atherosclerotic calcification. Precontrast images show no aortic intramural hematoma. There is no blood pool, dissection or penetrating ulcer demonstrated on arterial phase postcontrast imaging. There is a conventional 3 vessel aortic arch branching pattern. The proximal arch vessels are widely patent. --Pulmonary Arteries: Contrast  timing is optimized for preferential opacification of the aorta. Within that limitation, normal central pulmonary arteries. Mediastinum/Nodes: No mediastinal, hilar or axillary lymphadenopathy. The visualized thyroid and thoracic esophageal course are unremarkable. Lungs/Pleura: There is biapical bullous emphysema. No pleural effusion or pneumothorax. No focal airspace consolidation or pulmonary edema. Musculoskeletal: No chest wall abnormality. No acute osseous findings. Review of the MIP images confirms the above findings. CTA ABDOMEN AND PELVIS FINDINGS VASCULAR Aorta: Normal caliber aorta without aneurysm, dissection, vasculitis or hemodynamically significant stenosis. There is moderate aortic atherosclerosis. Celiac: No aneurysm, dissection or hemodynamically significant stenosis. Normal branching pattern. SMA: Widely patent without dissection or stenosis. Renals: Paired right and single left renal arteries. No aneurysm, dissection, stenosis or evidence of fibromuscular dysplasia. IMA: Origin is occluded but the distal arteries patent. Inflow: Moderate atherosclerotic calcification. No aneurysm, dissection or high-grade stenosis. Status post left femoral endarterectomy. Veins: Incidentally noted retro iliac left renal vein Review of the MIP images confirms the above findings. NON-VASCULAR Hepatobiliary: Normal hepatic contours and density. No visible biliary dilatation. Status post cholecystectomy. Pancreas: There is moderate stranding surrounding the pancreatic body and tail. There is no walled-off fluid collection. No evidence of pancreatic necrosis or hemorrhage. Spleen: Multiple calcified granulomata Adrenals/Urinary Tract: --Adrenal glands: Normal. --Right kidney/ureter: No hydronephrosis or perinephric stranding. No nephrolithiasis. No obstructing ureteral stones. --Left kidney/ureter: No hydronephrosis or perinephric stranding. No nephrolithiasis. No obstructing ureteral stones. --Urinary bladder:  Unremarkable. Stomach/Bowel: --Stomach/Duodenum: No hiatal hernia or other gastric abnormality. Normal duodenal course and caliber. --Small bowel: No dilatation or inflammation. --Colon: No focal abnormality. --Appendix: Normal. Lymphatic:  No abdominal or pelvic lymphadenopathy. Reproductive: There are prosthetic calcifications. Musculoskeletal. No bony spinal canal stenosis or focal osseous abnormality. Other: None. Review of the MIP images confirms  the above findings. IMPRESSION: 1. No acute aortic syndrome.  Aortic atherosclerosis (ICD10-I70.0). 2. Acute pancreatitis without pseudocyst or defined fluid collection. 3.  Emphysema (ICD10-J43.9). Electronically Signed   By: Ulyses Jarred M.D.   On: 09/30/2018 20:18    Procedures .Critical Care Performed by: Margarita Mail, PA-C Authorized by: Margarita Mail, PA-C   Critical care provider statement:    Critical care time (minutes):  40   Critical care time was exclusive of:  Separately billable procedures and treating other patients   Critical care was necessary to treat or prevent imminent or life-threatening deterioration of the following conditions:  Endocrine crisis (acute pancreatitis)   Critical care was time spent personally by me on the following activities:  Discussions with consultants, evaluation of patient's response to treatment, examination of patient, ordering and performing treatments and interventions, ordering and review of laboratory studies, ordering and review of radiographic studies, pulse oximetry, re-evaluation of patient's condition, obtaining history from patient or surrogate and review of old charts   (including critical care time)  Medications Ordered in ED Medications  piperacillin-tazobactam (ZOSYN) IVPB 3.375 g (3.375 g Intravenous New Bag/Given 09/30/18 2128)  enoxaparin (LOVENOX) injection 40 mg (40 mg Subcutaneous Given 09/30/18 2204)  0.9 %  sodium chloride infusion ( Intravenous New Bag/Given 09/30/18 2212)    acetaminophen (TYLENOL) tablet 650 mg (has no administration in time range)    Or  acetaminophen (TYLENOL) suppository 650 mg (has no administration in time range)  LORazepam (ATIVAN) tablet 1 mg (has no administration in time range)    Or  LORazepam (ATIVAN) injection 1 mg (has no administration in time range)  thiamine (VITAMIN B-1) tablet 100 mg (has no administration in time range)    Or  thiamine (B-1) injection 100 mg (has no administration in time range)  folic acid (FOLVITE) tablet 1 mg (has no administration in time range)  multivitamin with minerals tablet 1 tablet (has no administration in time range)  LORazepam (ATIVAN) injection 0-4 mg (has no administration in time range)    Followed by  LORazepam (ATIVAN) injection 0-4 mg (has no administration in time range)  ondansetron (ZOFRAN) injection 4 mg (has no administration in time range)  HYDROmorphone (DILAUDID) injection 1 mg (has no administration in time range)  nicotine (NICODERM CQ - dosed in mg/24 hours) patch 14 mg (has no administration in time range)  HYDROmorphone (DILAUDID) injection 0.5 mg (0.5 mg Intravenous Given 09/30/18 1336)  piperacillin-tazobactam (ZOSYN) IVPB 3.375 g (0 g Intravenous Stopped 09/30/18 1444)  HYDROmorphone (DILAUDID) injection 1 mg (1 mg Intravenous Given 09/30/18 1444)  HYDROmorphone (DILAUDID) injection 1 mg (1 mg Intravenous Given 09/30/18 1617)  iohexol (OMNIPAQUE) 350 MG/ML injection 100 mL (100 mLs Intravenous Contrast Given 09/30/18 1950)  HYDROmorphone (DILAUDID) injection 1 mg (1 mg Intravenous Given 09/30/18 1945)     Initial Impression / Assessment and Plan / ED Course  I have reviewed the triage vital signs and the nursing notes.  Pertinent labs & imaging results that were available during my care of the patient were reviewed by me and considered in my medical decision making (see chart for details).  Clinical Course as of Sep 29 2220  Thu Sep 30, 2018  1500 Lipase(!): 206 [AH]  1920  CT Angio Chest/Abd/Pel for Dissection W and/or Wo Contrast [AH]    Clinical Course User Index [AH] Margarita Mail, PA-C       Patient with severe abdominal pain, flank pain.  He is a vasculopath he has a history  of reflux and chronic alcohol abuse.  Given his current clinical presentation I have high concern for surgical abdominal process dose of Zosyn secondarily perforated abdominal viscus, pancreatitis, acute mesenteric ischemia, diverticulitis, retroperitoneal hemorrhage, abdominal aortic dissection or aneurysm.  Patient receiving pain medications and fluids.  He will need a CT angiogram of the chest abdomen and pelvis.   CC: Abdominal pain VS:  Vitals:   09/30/18 2100 09/30/18 2130 09/30/18 2200 09/30/18 2203  BP: (!) 173/110 (!) 170/113 (!) 173/116   Pulse: 100 (!) 101 98   Temp:    98.1 F (36.7 C)  TempSrc:    Oral  SpO2: 98% 95% 96%   Weight:      Height:        XL:[id number]:History is gathered by patient and EMR. DDX:The causes for generalized abdominal pain include but are not limited to gastritis, gastroenteritis, IBS, pancreatitis, peritonitis, intestinal ischemia, constipation, UTI, intestinal obstruction, perforated viscus, eg, peptic ulcer, appendix, gallbladder, diverticulitis, physical or sexual abuse, abdominal abscess, ruptured spleen, AAA, diabetic ketoacidosis, hypercalcemia, uremia, parasitic or other infection, eg: tapeworms, flukes, Giargia, Cryto, Yersinia, adrenal insufficiency,lead poisoning, iron toxicity, polyarteritis nodosa, Henoch-Schnlein purpura, porphyria, eg, acute intermittent porphyria, familial Mediterranean fever.  Labs: I reviewed the labs which show normal creatinine, UA with protein and ketones and no evidence of infection.  White count 11.7 thousand, left shift.  CMP shows elevated glucose, slight anion gap acidosis.  Patient's lipase 206. Imaging: I personally reviewed the images (CT abdomen and pelvis) which show(s) * shows acute pancreatitis without  any other acute abnormalities ** EKG: ED ECG REPORT   Date: 09/30/2018  Rate: 98  Rhythm: normal sinus rhythm, premature atrial contractions (PAC) and premature ventricular contractions (PVC)  QRS Axis: normal  Intervals: normal  ST/T Wave abnormalities: nonspecific ST changes  Conduction Disutrbances:none  Narrative Interpretation:   Old EKG Reviewed: changes noted  I have personally reviewed the EKG tracing and agree with the computerized printout as noted..  MDM: Patient with acute pancreatitis.  Patient will be admitted to the hospitalist service for continued management.  Pain has improved significantly but has required multiple doses of pain medication. Patient disposition: Admit Patient condition: Stable the patient appears reasonably stabilized for admission considering the current resources, flow, and capabilities available in the ED at this time, and I doubt any other [facility] further screening and/or treatment in the ED prior to admission.'  Final Clinical Impressions(s) / ED Diagnoses   Final diagnoses:  Alcohol-induced acute pancreatitis without infection or necrosis    ED Discharge Orders    None       Arthor CaptainHarris, Jibreel Fedewa, PA-C 09/30/18 2222    Vanetta MuldersZackowski, Scott, MD 10/01/18 [phone]

## 2018-09-30 NOTE — Progress Notes (Addendum)
  Pharmacy Antibiotic Note  Brady Bell is a 63 y.o. male admitted on 09/30/2018 with intra-abdominal infection.  Pharmacy has been consulted for Zosyn dosing.  Plan: Give Zosyn 3.375g IV x1 over 30 minutes Then start Zosyn 3.375g IV q8h (4-hr infusion) Pharmacy will continue to monitor labs, cultures and patient progress.  Height: 5\' 10"  (177.8 cm) Weight: 140 lb (63.5 kg) IBW/kg (Calculated) : 73  Temp (24hrs), Avg:98.3 F (36.8 C), Min:98.3 F (36.8 C), Max:98.3 F (36.8 C)  No results for input(s): WBC, CREATININE, LATICACIDVEN, VANCOTROUGH, VANCOPEAK, VANCORANDOM, GENTTROUGH, GENTPEAK, GENTRANDOM, TOBRATROUGH, TOBRAPEAK, TOBRARND, AMIKACINPEAK, AMIKACINTROU, AMIKACIN in the last 168 hours.  CrCl cannot be calculated (Patient's most recent lab result is older than the maximum 21 days allowed.).    No Known Allergies  Antimicrobials this admission: Zosyn 7/2 >>      Microbiology results:  7/2 U/A : pending    Thank you for allowing pharmacy to be a part of this patient's care.  Despina Pole 09/30/2018 1:56 PM

## 2018-09-30 NOTE — ED Provider Notes (Signed)
Medical screening examination/treatment/procedure(s) were conducted as a shared visit with non-physician practitioner(s) and myself.  I personally evaluated the patient during the encounter.      Results for orders placed or performed during the hospital encounter of 09/30/18  Comprehensive metabolic panel  Result Value Ref Range   Sodium 135 135 - 145 mmol/L   Potassium 3.5 3.5 - 5.1 mmol/L   Chloride 96 (L) 98 - 111 mmol/L   CO2 21 (L) 22 - 32 mmol/L   Glucose, Bld 124 (H) 70 - 99 mg/dL   BUN 9 6 - 20 mg/dL   Creatinine, Ser 0.73 0.61 - 1.24 mg/dL   Calcium 9.4 8.9 - 10.3 mg/dL   Total Protein 7.9 6.5 - 8.1 g/dL   Albumin 4.5 3.5 - 5.0 g/dL   AST 25 15 - 41 U/L   ALT 18 0 - 44 U/L   Alkaline Phosphatase 97 38 - 126 U/L   Total Bilirubin 1.3 (H) 0.3 - 1.2 mg/dL   GFR calc non Af Amer >60 >60 mL/min   GFR calc Af Amer >60 >60 mL/min   Anion gap 18 (H) 5 - 15  Lipase, blood  Result Value Ref Range   Lipase 206 (H) 11 - 51 U/L  Lactic acid, plasma  Result Value Ref Range   Lactic Acid, Venous 1.1 0.5 - 1.9 mmol/L  CBC with Differential/Platelet  Result Value Ref Range   WBC 11.7 (H) 4.0 - 10.5 K/uL   RBC 4.38 4.22 - 5.81 MIL/uL   Hemoglobin 14.3 13.0 - 17.0 g/dL   HCT 41.7 39.0 - 52.0 %   MCV 95.2 80.0 - 100.0 fL   MCH 32.6 26.0 - 34.0 pg   MCHC 34.3 30.0 - 36.0 g/dL   RDW 12.1 11.5 - 15.5 %   Platelets 280 150 - 400 K/uL   nRBC 0.0 0.0 - 0.2 %   Neutrophils Relative % 81 %   Neutro Abs 9.6 (H) 1.7 - 7.7 K/uL   Lymphocytes Relative 6 %   Lymphs Abs 0.8 0.7 - 4.0 K/uL   Monocytes Relative 11 %   Monocytes Absolute 1.2 (H) 0.1 - 1.0 K/uL   Eosinophils Relative 1 %   Eosinophils Absolute 0.1 0.0 - 0.5 K/uL   Basophils Relative 1 %   Basophils Absolute 0.1 0.0 - 0.1 K/uL   Immature Granulocytes 0 %   Abs Immature Granulocytes 0.04 0.00 - 0.07 K/uL    Patient seen by me along with physician assistant.  Patient with acute onset of sort of left lower quadrant  suprapubic abdominal pain that was quite severe that started about 4 hours prior to arrival.  No dysuria.  Patient without any history of kidney stones in the past.  Patient was in significant pain upon arrival which raise some concerns about a aortic dissection.  Patient's blood pressure was elevated some systolics here 623 but he did not have his daily meds.  However he points to the pain being left lower quadrant suprapubic area.  Feels better with pain medicine that was given here.  CT scan of the abdomen was ordered.  And is pending and will help with the disposition.  Patient's labs significant for an elevated lipase.  But his pain is not in the epigastric or left upper quadrant area.  Patient has a mild leukocytosis with a white count 11,000.  Disposition will be as per CT scan.  It is still possible it could be renal colic.  Sometimes they will  cause a lot of bladder pain.  If the stone is close to the bladder.   Vanetta MuldersZackowski, Tisheena Maguire, MD 09/30/18 [phone]

## 2018-09-30 NOTE — ED Notes (Signed)
ED TO INPATIENT HANDOFF REPORT  ED Nurse Name and Phone #: Vickye Astorino [phone]  S Name/Age/Gender Brady Bell 63 y.o. male Room/Bed: APA04/APA04  Code Status   Code Status: Prior  Home/SNF/Other Home Patient oriented to: self, place, time and situation Is this baseline? Yes   Triage Complete: Triage complete  Chief Complaint Flank Pain  Triage Note Patient complaining of left flank pain starting 4 hours ago. Denies dysuria.   Allergies No Known Allergies  Level of Care/Admitting Diagnosis ED Disposition    ED Disposition Condition Comment   Admit  The patient appears reasonably stabilized for admission considering the current resources, flow, and capabilities available in the ED at this time, and I doubt any other [facility] requiring further screening and/or treatment in the ED prior to admission is  present.       B Medical/Surgery History Past Medical History:  Diagnosis Date  . Chronic ankle pain   . Chronic knee pain   . GERD (gastroesophageal reflux disease)   . Hepatitis    Hep A in 1980's  . History of atrial flutter    Documented April/May 2019 in the setting of pancreatitis  . Hypoglycemia   . Migraines   . Pancreatitis   . Peripheral vascular disease (HCC)   . Recurrent sinus infections   . Seizures (HCC)   . Shingles    Past Surgical History:  Procedure Laterality Date  . ABDOMINAL AORTOGRAM N/A 04/23/2017   Procedure: ABDOMINAL AORTOGRAM;  Surgeon: Fransisco Hertzhen, Brian L, MD;  [facility];  Service: Cardiovascular;  Laterality: N/A;  . CHOLECYSTECTOMY N/A 09/23/2017   Procedure: LAPAROSCOPIC CHOLECYSTECTOMY;  Surgeon: Franky MachoJenkins, Mark, MD;  Location: [facility];  Service: General;  Laterality: N/A;  . ENDARTERECTOMY FEMORAL Left 04/28/2017   Procedure: ENDARTERECTOMY LEFT ILIO-FEMORAL ARTERY;  Surgeon: Fransisco Hertzhen, Brian L, MD;  [facility];  Service: Vascular;  Laterality: Left;  . FASCIOTOMY  09/06/2011   Procedure: FASCIOTOMY;  Surgeon: Fransisco HertzBrian L  Chen, MD;  [facility];  Service: Vascular;  Laterality: Left;  . FEMORAL-TIBIAL BYPASS GRAFT  09/06/2011   Procedure: BYPASS GRAFT FEMORAL-TIBIAL ARTERY;  Surgeon: Fransisco HertzBrian L Chen, MD;  [facility];  Service: Vascular;  Laterality: Left;  . KNEE CARTILAGE SURGERY Left   . LOWER EXTREMITY ANGIOGRAM N/A 09/05/2011   Procedure: LOWER EXTREMITY ANGIOGRAM;  Surgeon: Sherren Kernsharles E Fields, MD;  [facility];  Service: Cardiovascular;  Laterality: N/A;  . LOWER EXTREMITY ANGIOGRAM Left 04/15/2012   Procedure: LOWER EXTREMITY ANGIOGRAM;  Surgeon: Fransisco HertzBrian L Chen, MD;  [facility];  Service: Cardiovascular;  Laterality: Left;  . LOWER EXTREMITY ANGIOGRAPHY Bilateral 04/23/2017   Procedure: Lower Extremity Angiography;  Surgeon: Fransisco Hertzhen, Brian L, MD;  [facility];  Service: Cardiovascular;  Laterality: Bilateral;  . PATCH ANGIOPLASTY Left 04/28/2017   Procedure: ILIO-FEMORAL ARTERY PATCH ANGIOPLASTY USING Livia SnellenXENOSURE BIOLOGIC PATCH;  Surgeon: Fransisco Hertzhen, Brian L, MD;  [facility];  Service: Vascular;  Laterality: Left;  . PR VEIN BYPASS GRAFT,AORTO-FEM-POP  09/06/11   Left   . TOOTH EXTRACTION  June-July-Aug. 2015   Several      A IV Location/Drains/Wounds Patient Lines/Drains/Airways Status   Active Line/Drains/Airways    Name:   Placement date:   Placement time:   Site:   Days:   Peripheral IV 09/30/18 Left Antecubital   09/30/18    1335    Antecubital   less than 1   Incision (Closed) 09/23/17 Abdomen   09/23/17  1132     372   Incision - 4 Ports Abdomen Umbilicus Left;Superior Right;Lateral Right;Superior   09/23/17    1126     372          Intake/Output Last 24 hours No intake or output data in the 24 hours ending 09/30/18 2050  Labs/Imaging Results for orders placed or performed during the hospital encounter of 09/30/18 (from the past 48 hour(s))  Comprehensive metabolic panel     Status: Abnormal   Collection Time: 09/30/18  1:58 PM  Result Value Ref Range    Sodium 135 135 - 145 mmol/L   Potassium 3.5 3.5 - 5.1 mmol/L   Chloride 96 (L) 98 - 111 mmol/L   CO2 21 (L) 22 - 32 mmol/L   Glucose, Bld 124 (H) 70 - 99 mg/dL   BUN 9 6 - 20 mg/dL   Creatinine, Ser 8.65 0.61 - 1.24 mg/dL   Calcium 9.4 8.9 - 78.4 mg/dL   Total Protein 7.9 6.5 - 8.1 g/dL   Albumin 4.5 3.5 - 5.0 g/dL   AST 25 15 - 41 U/L   ALT 18 0 - 44 U/L   Alkaline Phosphatase 97 38 - 126 U/L   Total Bilirubin 1.3 (H) 0.3 - 1.2 mg/dL   GFR calc non Af Amer >60 >60 mL/min   GFR calc Af Amer >60 >60 mL/min   Anion gap 18 (H) 5 - 15    Comment: Performed at [facility], [address] [place], [place] [address]  Lipase, blood     Status: Abnormal   Collection Time: 09/30/18  1:58 PM  Result Value Ref Range   Lipase 206 (H) 11 - 51 U/L    Comment: Performed at [facility], [address] [place], [place] [address]  Lactic acid, plasma     Status: None   Collection Time: 09/30/18  3:04 PM  Result Value Ref Range   Lactic Acid, Venous 1.1 0.5 - 1.9 mmol/L    Comment: Performed at [facility], [address], [place], [place] [address]  CBC with Differential/Platelet     Status: Abnormal   Collection Time: 09/30/18  3:04 PM  Result Value Ref Range   WBC 11.7 (H) 4.0 - 10.5 K/uL   RBC 4.38 4.22 - 5.81 MIL/uL   Hemoglobin 14.3 13.0 - 17.0 g/dL   HCT 10.2 72.5 - 36.6 %   MCV 95.2 80.0 - 100.0 fL   MCH 32.6 26.0 - 34.0 pg   MCHC 34.3 30.0 - 36.0 g/dL   RDW 44.0 34.7 - 42.5 %   Platelets 280 150 - 400 K/uL   nRBC 0.0 0.0 - 0.2 %   Neutrophils Relative % 81 %   Neutro Abs 9.6 (H) 1.7 - 7.7 K/uL   Lymphocytes Relative 6 %   Lymphs Abs 0.8 0.7 - 4.0 K/uL   Monocytes Relative 11 %   Monocytes Absolute 1.2 (H) 0.1 - 1.0 K/uL   Eosinophils Relative 1 %   Eosinophils Absolute 0.1 0.0 - 0.5 K/uL   Basophils Relative 1 %   Basophils Absolute 0.1 0.0 - 0.1 K/uL   Immature Granulocytes 0 %   Abs Immature Granulocytes 0.04 0.00 - 0.07 K/uL    Comment: Performed at [facility],  [address] [place], [place] [address]  SARS Coronavirus 2 (CEPHEID - Performed in [facility] hospital lab), Hosp Order     Status: None   Collection Time: 09/30/18  3:09 PM   Specimen:  Nasopharyngeal Swab  Result Value Ref Range   SARS Coronavirus 2 NEGATIVE NEGATIVE    Comment: (NOTE) If result is NEGATIVE SARS-CoV-2 target nucleic acids are NOT DETECTED. The SARS-CoV-2 RNA is generally detectable in upper and lower  respiratory specimens during the acute phase of infection. The lowest  concentration of SARS-CoV-2 viral copies this assay can detect is 250  copies / mL. A negative result does not preclude SARS-CoV-2 infection  and should not be used as the sole basis for treatment or other  patient management decisions.  A negative result may occur with  improper specimen collection / handling, submission of specimen other  than nasopharyngeal swab, presence of viral mutation(s) within the  areas targeted by this assay, and inadequate number of viral copies  (<250 copies / mL). A negative result must be combined with clinical  observations, patient history, and epidemiological information. If result is POSITIVE SARS-CoV-2 target nucleic acids are DETECTED. The SARS-CoV-2 RNA is generally detectable in upper and lower  respiratory specimens dur ing the acute phase of infection.  Positive  results are indicative of active infection with SARS-CoV-2.  Clinical  correlation with patient history and other diagnostic information is  necessary to determine patient infection status.  Positive results do  not rule out bacterial infection or co-infection with other viruses. If result is PRESUMPTIVE POSTIVE SARS-CoV-2 nucleic acids MAY BE PRESENT.   A presumptive positive result was obtained on the submitted specimen  and confirmed on repeat testing.  While 2019 novel coronavirus  (SARS-CoV-2) nucleic acids may be present in the submitted sample  additional confirmatory testing may be necessary  for epidemiological  and / or clinical management purposes  to differentiate between  SARS-CoV-2 and other Sarbecovirus currently known to infect humans.  If clinically indicated additional testing with an alternate test  methodology [phone]([id number]) is advised. The SARS-CoV-2 RNA is generally  detectable in upper and lower respiratory sp ecimens during the acute  phase of infection. The expected result is Negative. Fact Sheet for Patients:  BoilerBrush.com.cyhttps://www.fda.gov/media/136312/download Fact Sheet for Healthcare Providers: https://pope.com/https://www.fda.gov/media/136313/download This test is not yet approved or cleared by the [place] FDA and has been authorized for detection and/or diagnosis of SARS-CoV-2 by FDA under an Emergency Use Authorization (EUA).  This EUA will remain in effect (meaning this test can be used) for the duration of the COVID-19 declaration under Section 564(b)(1) of the Act, 21 U.S.C. section 360bbb-3(b)(1), unless the authorization is terminated or revoked sooner. Performed at [facility], [address] [place], [place] [address]   Urinalysis, Routine w reflex microscopic     Status: Abnormal   Collection Time: 09/30/18  7:38 PM  Result Value Ref Range   Color, Urine YELLOW YELLOW   APPearance CLEAR CLEAR   Specific Gravity, Urine 1.016 1.005 - 1.030   pH 8.0 5.0 - 8.0   Glucose, UA NEGATIVE NEGATIVE mg/dL   Hgb urine dipstick NEGATIVE NEGATIVE   Bilirubin Urine NEGATIVE NEGATIVE   Ketones, ur 20 (A) NEGATIVE mg/dL   Protein, ur 30 (A) NEGATIVE mg/dL   Nitrite NEGATIVE NEGATIVE   Leukocytes,Ua NEGATIVE NEGATIVE   RBC / HPF 0-5 0 - 5 RBC/hpf   WBC, UA 0-5 0 - 5 WBC/hpf   Bacteria, UA NONE SEEN NONE SEEN    Comment: Performed at [facility], [address] [place], [place] [address]  I-stat Creatinine, ED     Status: None   Collection Time: 09/30/18  8:22 PM  Result Value Ref Range  Creatinine, Ser 0.80 0.61 - 1.24 mg/dL   Ct Angio Chest/abd/pel For Dissection W  And/or Wo Contrast  Result Date: 09/30/2018 CLINICAL DATA:  Left flank pain.  History of pancreatitis. EXAM: CT ANGIOGRAPHY CHEST, ABDOMEN AND PELVIS TECHNIQUE: Multidetector CT imaging through the chest, abdomen and pelvis was performed using the standard protocol during bolus administration of intravenous contrast. Multiplanar reconstructed images and MIPs were obtained and reviewed to evaluate the vascular anatomy. CONTRAST:  100 mL OMNIPAQUE IOHEXOL 350 MG/ML SOLN COMPARISON:  CT abdomen pelvis 04/12/2018 FINDINGS: CTA CHEST FINDINGS Cardiovascular: --Heart: The heart size is normal. There is nopericardial effusion. There are coronary artery calcifications. --Aorta: The course and caliber of the thoracic aorta are normal. There is mild aortic atherosclerotic calcification. Precontrast images show no aortic intramural hematoma. There is no blood pool, dissection or penetrating ulcer demonstrated on arterial phase postcontrast imaging. There is a conventional 3 vessel aortic arch branching pattern. The proximal arch vessels are widely patent. --Pulmonary Arteries: Contrast timing is optimized for preferential opacification of the aorta. Within that limitation, normal central pulmonary arteries. Mediastinum/Nodes: No mediastinal, hilar or axillary lymphadenopathy. The visualized thyroid and thoracic esophageal course are unremarkable. Lungs/Pleura: There is biapical bullous emphysema. No pleural effusion or pneumothorax. No focal airspace consolidation or pulmonary edema. Musculoskeletal: No chest wall abnormality. No acute osseous findings. Review of the MIP images confirms the above findings. CTA ABDOMEN AND PELVIS FINDINGS VASCULAR Aorta: Normal caliber aorta without aneurysm, dissection, vasculitis or hemodynamically significant stenosis. There is moderate aortic atherosclerosis. Celiac: No aneurysm, dissection or hemodynamically significant stenosis. Normal branching pattern. SMA: Widely patent without  dissection or stenosis. Renals: Paired right and single left renal arteries. No aneurysm, dissection, stenosis or evidence of fibromuscular dysplasia. IMA: Origin is occluded but the distal arteries patent. Inflow: Moderate atherosclerotic calcification. No aneurysm, dissection or high-grade stenosis. Status post left femoral endarterectomy. Veins: Incidentally noted retro iliac left renal vein Review of the MIP images confirms the above findings. NON-VASCULAR Hepatobiliary: Normal hepatic contours and density. No visible biliary dilatation. Status post cholecystectomy. Pancreas: There is moderate stranding surrounding the pancreatic body and tail. There is no walled-off fluid collection. No evidence of pancreatic necrosis or hemorrhage. Spleen: Multiple calcified granulomata Adrenals/Urinary Tract: --Adrenal glands: Normal. --Right kidney/ureter: No hydronephrosis or perinephric stranding. No nephrolithiasis. No obstructing ureteral stones. --Left kidney/ureter: No hydronephrosis or perinephric stranding. No nephrolithiasis. No obstructing ureteral stones. --Urinary bladder: Unremarkable. Stomach/Bowel: --Stomach/Duodenum: No hiatal hernia or other gastric abnormality. Normal duodenal course and caliber. --Small bowel: No dilatation or inflammation. --Colon: No focal abnormality. --Appendix: Normal. Lymphatic:  No abdominal or pelvic lymphadenopathy. Reproductive: There are prosthetic calcifications. Musculoskeletal. No bony spinal canal stenosis or focal osseous abnormality. Other: None. Review of the MIP images confirms the above findings. IMPRESSION: 1. No acute aortic syndrome.  Aortic atherosclerosis (ICD10-I70.0). 2. Acute pancreatitis without pseudocyst or defined fluid collection. 3.  Emphysema (ICD10-J43.9). Electronically Signed   By: Deatra RobinsonKevin  Herman M.D.   On: 09/30/2018 20:18    Pending Labs Unresulted Labs (From admission, onward)    Start     Ordered   09/30/18 1315  CBC with Differential  Once,    STAT     09/30/18 1316          Vitals/Pain Today's Vitals   09/30/18 1830 09/30/18 1940 09/30/18 2009 09/30/18 2050  BP: (!) 156/95  (!) 164/87   Pulse: 97  (!) 101   Temp:      TempSrc:      SpO2:  95%  96%   Weight:      Height:      PainSc:  6   4     Isolation Precautions No active isolations  Medications Medications  piperacillin-tazobactam (ZOSYN) IVPB 3.375 g (has no administration in time range)  HYDROmorphone (DILAUDID) injection 0.5 mg (0.5 mg Intravenous Given 09/30/18 1336)  piperacillin-tazobactam (ZOSYN) IVPB 3.375 g (0 g Intravenous Stopped 09/30/18 1444)  HYDROmorphone (DILAUDID) injection 1 mg (1 mg Intravenous Given 09/30/18 1444)  HYDROmorphone (DILAUDID) injection 1 mg (1 mg Intravenous Given 09/30/18 1617)  iohexol (OMNIPAQUE) 350 MG/ML injection 100 mL (100 mLs Intravenous Contrast Given 09/30/18 1950)  HYDROmorphone (DILAUDID) injection 1 mg (1 mg Intravenous Given 09/30/18 1945)    Mobility walks with device Moderate fall risk   Focused Assessments    R Recommendations: See Admitting Provider Note  Report given to:   Additional Notes

## 2018-10-01 ENCOUNTER — Inpatient Hospital Stay (HOSPITAL_COMMUNITY): Payer: Medicaid Other

## 2018-10-01 DIAGNOSIS — I4892 Unspecified atrial flutter: Secondary | ICD-10-CM

## 2018-10-01 LAB — HIV ANTIBODY (ROUTINE TESTING W REFLEX): HIV Screen 4th Generation wRfx: NONREACTIVE

## 2018-10-01 LAB — TSH: TSH: 2.108 u[IU]/mL (ref 0.350–4.500)

## 2018-10-01 LAB — COMPREHENSIVE METABOLIC PANEL
ALT: 32 U/L (ref 0–44)
AST: 53 U/L — ABNORMAL HIGH (ref 15–41)
Albumin: 3.9 g/dL (ref 3.5–5.0)
Alkaline Phosphatase: 105 U/L (ref 38–126)
Anion gap: 13 (ref 5–15)
BUN: 12 mg/dL (ref 6–20)
CO2: 23 mmol/L (ref 22–32)
Calcium: 8.7 mg/dL — ABNORMAL LOW (ref 8.9–10.3)
Chloride: 98 mmol/L (ref 98–111)
Creatinine, Ser: 0.86 mg/dL (ref 0.61–1.24)
GFR calc Af Amer: >60 mL/min (ref >60)
GFR calc non Af Amer: >60 mL/min (ref >60)
Glucose, Bld: 135 mg/dL — ABNORMAL HIGH (ref 70–99)
Potassium: 3.8 mmol/L (ref 3.5–5.1)
Sodium: 134 mmol/L — ABNORMAL LOW (ref 135–145)
Total Bilirubin: 1.4 mg/dL — ABNORMAL HIGH (ref 0.3–1.2)
Total Protein: 6.9 g/dL (ref 6.5–8.1)

## 2018-10-01 LAB — LIPASE, BLOOD: Lipase: 154 U/L — ABNORMAL HIGH (ref 11–51)

## 2018-10-01 LAB — LIPID PANEL
Cholesterol: 138 mg/dL (ref 0–200)
HDL: 77 mg/dL (ref >40)
LDL Cholesterol: 52 mg/dL (ref 0–99)
Total CHOL/HDL Ratio: 1.8 RATIO
Triglycerides: 44 mg/dL (ref <150)
VLDL: 9 mg/dL (ref 0–40)

## 2018-10-01 LAB — CBC
HCT: 42.8 % (ref 39.0–52.0)
Hemoglobin: 14.4 g/dL (ref 13.0–17.0)
MCH: 32.7 pg (ref 26.0–34.0)
MCHC: 33.6 g/dL (ref 30.0–36.0)
MCV: 97.3 fL (ref 80.0–100.0)
Platelets: 296 10*3/uL (ref 150–400)
RBC: 4.4 MIL/uL (ref 4.22–5.81)
RDW: 12.5 % (ref 11.5–15.5)
WBC: 13.8 10*3/uL — ABNORMAL HIGH (ref 4.0–10.5)
nRBC: 0 % (ref 0.0–0.2)

## 2018-10-01 LAB — TROPONIN I (HIGH SENSITIVITY)
Troponin I (High Sensitivity): 7 ng/L (ref <18)
Troponin I (High Sensitivity): 7 ng/L (ref <18)

## 2018-10-01 MED ORDER — SUMATRIPTAN SUCCINATE 50 MG PO TABS: 50.0000 mg | ORAL_TABLET | ORAL | Status: DC | PRN

## 2018-10-01 NOTE — Progress Notes (Signed)
PROGRESS NOTE  Brady Bell DJM:[id number] DOB: [birth year] DOA: 09/30/2018 PCP: Brady Du, MD  Brief History:  63 year old male with a history of GERD, alcohol abuse, atrial flutter, peripheral arterial disease, remote seizure, tobacco abuse, hypertension, pancreatitis presenting with left flank and left lower quadrant pain that developed after an episode of nausea and vomiting on 09/30/2018.  The patient continues to drink 5-7 mixed drinks on a daily basis with shots of vodka.  He denies any illicit drug use.  He continues to smoke 3 to 5 cigarettes/day.  He denies any new medications or over-the-counter supplements.  He has some subjective fevers and chills at home, but denied any headache, neck pain, chest pain, diarrhea, dysuria, hematuria.  He has not had any hematochezia or melena.  He does note some increase in dyspnea on exertion over the past 2 to 3 days.  He has not had any hemoptysis.  Patient had a recent mission to the hospital from 04/12/2018 to 04/16/2018 when he was managed for acute pancreatitis.  He had an episode of atrial flutter at that time and was seen by cardiology.  He was felt to be a poor candidate for anticoagulation in the setting of his alcohol use. In the emergency department, the patient was afebrile hemodynamically stable saturating 97% room air.  WBC was 11.7 with unremarkable BMP.  AST 53, ALT 32, alk phosphatase 105, total bili 1.4, lipase 206.  CT of the abdomen and pelvis showed moderate stranding of the pancreatic body and tail.  CT angiogram of the chest was negative for pulmonary embolus or dissection.  CTA of the abdomen and pelvis showed patent SMA and celiac axis.  There was occlusion at the IMA origin but the distal arterial tree was patent.  Assessment/Plan: Acute alcoholic pancreatitis -Start trial of clear liquids -Triglyceride 44 -Right upper quadrant ultrasound -Continue IV fluids -Continue Protonix -Discontinue Zosyn  Atrial  flutter/atrial tachycardia -Patient is having some ectopy on examination -Place on telemetry -Previously seen by cardiology with [facility] 1-2 but not felt to be good candidate for [facility] due to chronic Etoh -Continue aspirin -Continue Cardizem CD  Abnormal EKG -personally reviewed EKG--sinus, ST depression II, III, aVF -not consistent with ACS clinically -with minimally elevated troponin and no chest pain-->demand ischemia -repeat EKG -place on tele -troponin 7>>7  Peripheral arterial disease -Continue aspirin  left common femoral to AT bypass with non-reversed ipsilateral great saphenous vein in 2013 by Dr. Bridgett Larsson  -left iliofemoral endarterectomy in January 2019 -05/25/2018 arterial ultrasound--patent left CFA-ATA without restenosis  Alcohol abuse -Alcohol withdrawal protocol  Tobacco abuse I have discussed tobacco cessation with the patient.  I have counseled the patient regarding the negative impacts of continued tobacco use including but not limited to lung cancer, COPD, and cardiovascular disease.  I have discussed alternatives to tobacco and modalities that may help facilitate tobacco cessation including but not limited to biofeedback, hypnosis, and medications.  Total time spent with tobacco counseling was 4 minutes.  Hypertension -continue diltiazem CD  History of seizures -Continue Depakote      Disposition Plan:   Home in 2-3 days  Family Communication:   No Family at bedside  Consultants:  none  Code Status:  FULL   DVT Prophylaxis:  [place] Lovenox   Procedures: As Listed in Progress Note Above  Antibiotics: Zosyn 7/2>>7/3       Subjective: Pt feeling a little better.  Denies f/c, cp, sob, n/v.  abd  pain is a little better.  Denies headache, dysuria, hematuria, hematochezia, melena, cough, hemoptysis  Objective: Vitals:   09/30/18 2239 10/01/18 0437 10/01/18 0500 10/01/18 0733  BP: (!) 160/104 (!) 164/100    Pulse: (!) 101 (!) 103    Resp: 17 17     Temp: 98.6 F (37 C) 98.5 F (36.9 C)    TempSrc:      SpO2: 97% 92%  93%  Weight:   65.6 kg   Height:        Intake/Output Summary (Last 24 hours) at 10/01/2018 0849 Last data filed at 10/01/2018 0606 Gross per 24 hour  Intake 589.85 ml  Output 2 ml  Net 587.85 ml   Weight change:  Exam:   General:  Pt is alert, follows commands appropriately, not in acute distress  HEENT: No icterus, No thrush, No neck mass, [place]/AT  Cardiovascular: RRR, S1/S2, no rubs, no gallops  Respiratory: R-basilar crackles, no wheeze  Abdomen: Soft/+BS, epigastric tender, non distended, no guarding  Extremities: No edema, No lymphangitis, No petechiae, No rashes, no synovitis   Data Reviewed: I have personally reviewed following labs and imaging studies Basic Metabolic Panel: Recent Labs  Lab 09/30/18 1358 09/30/18 2022 10/01/18 0207  NA 135  --  134*  K 3.5  --  3.8  CL 96*  --  98  CO2 21*  --  23  GLUCOSE 124*  --  135*  BUN 9  --  12  CREATININE 0.73 0.80 0.86  CALCIUM 9.4  --  8.7*   Liver Function Tests: Recent Labs  Lab 09/30/18 1358 10/01/18 0207  AST 25 53*  ALT 18 32  ALKPHOS 97 105  BILITOT 1.3* 1.4*  PROT 7.9 6.9  ALBUMIN 4.5 3.9   Recent Labs  Lab 09/30/18 1358 10/01/18 0207  LIPASE 206* 154*   No results for input(s): AMMONIA in the last 168 hours. Coagulation Profile: No results for input(s): INR, PROTIME in the last 168 hours. CBC: Recent Labs  Lab 09/30/18 1504 10/01/18 0207  WBC 11.7* 13.8*  NEUTROABS 9.6*  --   HGB 14.3 14.4  HCT 41.7 42.8  MCV 95.2 97.3  PLT 280 296   Cardiac Enzymes: No results for input(s): CKTOTAL, CKMB, CKMBINDEX, TROPONINI in the last 168 hours. BNP: Invalid input(s): POCBNP CBG: No results for input(s): GLUCAP in the last 168 hours. HbA1C: No results for input(s): HGBA1C in the last 72 hours. Urine analysis:    Component Value Date/Time   COLORURINE YELLOW 09/30/2018 1938   APPEARANCEUR CLEAR 09/30/2018 1938    LABSPEC 1.016 09/30/2018 1938   PHURINE 8.0 09/30/2018 1938   GLUCOSEU NEGATIVE 09/30/2018 1938   HGBUR NEGATIVE 09/30/2018 1938   BILIRUBINUR NEGATIVE 09/30/2018 1938   KETONESUR 20 (A) 09/30/2018 1938   PROTEINUR 30 (A) 09/30/2018 1938   UROBILINOGEN 0.2 01/05/2015 1316   NITRITE NEGATIVE 09/30/2018 1938   LEUKOCYTESUR NEGATIVE 09/30/2018 1938   Sepsis Labs: @LABRCNTIP (procalcitonin:4,lacticidven:4) ) Recent Results (from the past 240 hour(s))  SARS Coronavirus 2 (CEPHEID - Performed in [facility] hospital lab), Hosp Order     Status: None   Collection Time: 09/30/18  3:09 PM   Specimen: Nasopharyngeal Swab  Result Value Ref Range Status   SARS Coronavirus 2 NEGATIVE NEGATIVE Final    Comment: (NOTE) If result is NEGATIVE SARS-CoV-2 target nucleic acids are NOT DETECTED. The SARS-CoV-2 RNA is generally detectable in upper and lower  respiratory specimens during the acute phase of infection. The lowest  concentration of SARS-CoV-2 viral copies this assay can detect is 250  copies / mL. A negative result does not preclude SARS-CoV-2 infection  and should not be used as the sole basis for treatment or other  patient management decisions.  A negative result may occur with  improper specimen collection / handling, submission of specimen other  than nasopharyngeal swab, presence of viral mutation(s) within the  areas targeted by this assay, and inadequate number of viral copies  (<250 copies / mL). A negative result must be combined with clinical  observations, patient history, and epidemiological information. If result is POSITIVE SARS-CoV-2 target nucleic acids are DETECTED. The SARS-CoV-2 RNA is generally detectable in upper and lower  respiratory specimens dur ing the acute phase of infection.  Positive  results are indicative of active infection with SARS-CoV-2.  Clinical  correlation with patient history and other diagnostic information is  necessary to determine patient  infection status.  Positive results do  not rule out bacterial infection or co-infection with other viruses. If result is PRESUMPTIVE POSTIVE SARS-CoV-2 nucleic acids MAY BE PRESENT.   A presumptive positive result was obtained on the submitted specimen  and confirmed on repeat testing.  While 2019 novel coronavirus  (SARS-CoV-2) nucleic acids may be present in the submitted sample  additional confirmatory testing may be necessary for epidemiological  and / or clinical management purposes  to differentiate between  SARS-CoV-2 and other Sarbecovirus currently known to infect humans.  If clinically indicated additional testing with an alternate test  methodology [phone]) is advised. The SARS-CoV-2 RNA is generally  detectable in upper and lower respiratory sp ecimens during the acute  phase of infection. The expected result is Negative. Fact Sheet for Patients:  StrictlyIdeas.no Fact Sheet for Healthcare Providers: BankingDealers.co.za This test is not yet approved or cleared by the [place] FDA and has been authorized for detection and/or diagnosis of SARS-CoV-2 by FDA under an Emergency Use Authorization (EUA).  This EUA will remain in effect (meaning this test can be used) for the duration of the COVID-19 declaration under Section 564(b)(1) of the Act, 21 U.S.C. section 360bbb-3(b)(1), unless the authorization is terminated or revoked sooner. Performed at [facility], [address], [place], [place] [address]      Scheduled Meds: . aspirin EC  81 mg Oral Daily  . chlordiazePOXIDE  25 mg Oral Once  . cycloSPORINE  1 drop Both Eyes BID  . diltiazem  180 mg Oral Daily  . divalproex  500 mg Oral QHS  . enoxaparin (LOVENOX) injection  40 mg Subcutaneous Q24H  . fluticasone  2 spray Each Nare Daily  . folic acid  1 mg Oral Daily  . gabapentin  300 mg Oral Daily  . LORazepam  0-4 mg Intravenous Q6H   Followed by  . [START ON  10/03/2018] LORazepam  0-4 mg Intravenous Q12H  . multivitamin with minerals  1 tablet Oral Daily  . pantoprazole  40 mg Oral QAC breakfast  . thiamine  100 mg Oral Daily   Or  . thiamine  100 mg Intravenous Daily   Continuous Infusions: . sodium chloride 100 mL/hr at 09/30/18 2320  . piperacillin-tazobactam (ZOSYN)  IV 3.375 g (10/01/18 0606)    Procedures/Studies: Ct Angio Chest/abd/pel For Dissection W And/or Wo Contrast  Result Date: 09/30/2018 CLINICAL DATA:  Left flank pain.  History of pancreatitis. EXAM: CT ANGIOGRAPHY CHEST, ABDOMEN AND PELVIS TECHNIQUE: Multidetector CT imaging through the chest, abdomen and pelvis was performed using the  standard protocol during bolus administration of intravenous contrast. Multiplanar reconstructed images and MIPs were obtained and reviewed to evaluate the vascular anatomy. CONTRAST:  100 mL OMNIPAQUE IOHEXOL 350 MG/ML SOLN COMPARISON:  CT abdomen pelvis 04/12/2018 FINDINGS: CTA CHEST FINDINGS Cardiovascular: --Heart: The heart size is normal. There is nopericardial effusion. There are coronary artery calcifications. --Aorta: The course and caliber of the thoracic aorta are normal. There is mild aortic atherosclerotic calcification. Precontrast images show no aortic intramural hematoma. There is no blood pool, dissection or penetrating ulcer demonstrated on arterial phase postcontrast imaging. There is a conventional 3 vessel aortic arch branching pattern. The proximal arch vessels are widely patent. --Pulmonary Arteries: Contrast timing is optimized for preferential opacification of the aorta. Within that limitation, normal central pulmonary arteries. Mediastinum/Nodes: No mediastinal, hilar or axillary lymphadenopathy. The visualized thyroid and thoracic esophageal course are unremarkable. Lungs/Pleura: There is biapical bullous emphysema. No pleural effusion or pneumothorax. No focal airspace consolidation or pulmonary edema. Musculoskeletal: No chest wall  abnormality. No acute osseous findings. Review of the MIP images confirms the above findings. CTA ABDOMEN AND PELVIS FINDINGS VASCULAR Aorta: Normal caliber aorta without aneurysm, dissection, vasculitis or hemodynamically significant stenosis. There is moderate aortic atherosclerosis. Celiac: No aneurysm, dissection or hemodynamically significant stenosis. Normal branching pattern. SMA: Widely patent without dissection or stenosis. Renals: Paired right and single left renal arteries. No aneurysm, dissection, stenosis or evidence of fibromuscular dysplasia. IMA: Origin is occluded but the distal arteries patent. Inflow: Moderate atherosclerotic calcification. No aneurysm, dissection or high-grade stenosis. Status post left femoral endarterectomy. Veins: Incidentally noted retro iliac left renal vein Review of the MIP images confirms the above findings. NON-VASCULAR Hepatobiliary: Normal hepatic contours and density. No visible biliary dilatation. Status post cholecystectomy. Pancreas: There is moderate stranding surrounding the pancreatic body and tail. There is no walled-off fluid collection. No evidence of pancreatic necrosis or hemorrhage. Spleen: Multiple calcified granulomata Adrenals/Urinary Tract: --Adrenal glands: Normal. --Right kidney/ureter: No hydronephrosis or perinephric stranding. No nephrolithiasis. No obstructing ureteral stones. --Left kidney/ureter: No hydronephrosis or perinephric stranding. No nephrolithiasis. No obstructing ureteral stones. --Urinary bladder: Unremarkable. Stomach/Bowel: --Stomach/Duodenum: No hiatal hernia or other gastric abnormality. Normal duodenal course and caliber. --Small bowel: No dilatation or inflammation. --Colon: No focal abnormality. --Appendix: Normal. Lymphatic:  No abdominal or pelvic lymphadenopathy. Reproductive: There are prosthetic calcifications. Musculoskeletal. No bony spinal canal stenosis or focal osseous abnormality. Other: None. Review of the MIP  images confirms the above findings. IMPRESSION: 1. No acute aortic syndrome.  Aortic atherosclerosis (ICD10-I70.0). 2. Acute pancreatitis without pseudocyst or defined fluid collection. 3.  Emphysema (ICD10-J43.9). Electronically Signed   By: Ulyses Jarred M.D.   On: 09/30/2018 20:18    Orson Eva, DO  [facility] Pager [phone]  If 7PM-7AM, please contact night-coverage [email or web address] Password [email or web address] 10/01/2018, 8:49 AM   LOS: 1 day

## 2018-10-02 DIAGNOSIS — F10939 Alcohol use, unspecified with withdrawal, unspecified: Secondary | ICD-10-CM

## 2018-10-02 DIAGNOSIS — F10230 Alcohol dependence with withdrawal, uncomplicated: Secondary | ICD-10-CM

## 2018-10-02 DIAGNOSIS — F10239 Alcohol dependence with withdrawal, unspecified: Secondary | ICD-10-CM

## 2018-10-02 LAB — COMPREHENSIVE METABOLIC PANEL
ALT: 23 U/L (ref 0–44)
AST: 24 U/L (ref 15–41)
Albumin: 3.2 g/dL — ABNORMAL LOW (ref 3.5–5.0)
Alkaline Phosphatase: 87 U/L (ref 38–126)
Anion gap: 14 (ref 5–15)
BUN: 21 mg/dL — ABNORMAL HIGH (ref 6–20)
CO2: 23 mmol/L (ref 22–32)
Calcium: 8.8 mg/dL — ABNORMAL LOW (ref 8.9–10.3)
Chloride: 97 mmol/L — ABNORMAL LOW (ref 98–111)
Creatinine, Ser: 0.74 mg/dL (ref 0.61–1.24)
GFR calc Af Amer: >60 mL/min (ref >60)
GFR calc non Af Amer: >60 mL/min (ref >60)
Glucose, Bld: 67 mg/dL — ABNORMAL LOW (ref 70–99)
Potassium: 4.1 mmol/L (ref 3.5–5.1)
Sodium: 134 mmol/L — ABNORMAL LOW (ref 135–145)
Total Bilirubin: 1.1 mg/dL (ref 0.3–1.2)
Total Protein: 6.4 g/dL — ABNORMAL LOW (ref 6.5–8.1)

## 2018-10-02 MED ORDER — POTASSIUM CHLORIDE IN NACL 20-0.9 MEQ/L-% IV SOLN
INTRAVENOUS | Status: DC
  Administered 2018-10-02: 16:00:00 via INTRAVENOUS
  Administered 2018-10-03: 100 mL/h via INTRAVENOUS
  Administered 2018-10-03: 11:00:00 via INTRAVENOUS
  Administered 2018-10-04: 100 mL/h via INTRAVENOUS

## 2018-10-02 NOTE — Progress Notes (Signed)
PROGRESS NOTE  Brady Bell NFA:[id number] DOB: 1955-06-11 DOA: 09/30/2018 PCP: Sinda Du, MD  Brief History:  63 year old male with a history of GERD, alcohol abuse, atrial flutter, peripheral arterial disease, remote seizure, tobacco abuse, hypertension, pancreatitis presenting with left flank and left lower quadrant pain that developed after an episode of nausea and vomiting on 09/30/2018.  The patient continues to drink 5-7 mixed drinks on a daily basis with shots of vodka.  He denies any illicit drug use.  He continues to smoke 3 to 5 cigarettes/day.  He denies any new medications or over-the-counter supplements.  He has some subjective fevers and chills at home, but denied any headache, neck pain, chest pain, diarrhea, dysuria, hematuria.  He has not had any hematochezia or melena.  He does note some increase in dyspnea on exertion over the past 2 to 3 days.  He has not had any hemoptysis.  Patient had a recent mission to the hospital from 04/12/2018 to 04/16/2018 when he was managed for acute pancreatitis.  He had an episode of atrial flutter at that time and was seen by cardiology.  He was felt to be a poor candidate for anticoagulation in the setting of his alcohol use. In the emergency department, the patient was afebrile hemodynamically stable saturating 97% room air.  WBC was 11.7 with unremarkable BMP.  AST 53, ALT 32, alk phosphatase 105, total bili 1.4, lipase 206.  CT of the abdomen and pelvis showed moderate stranding of the pancreatic body and tail.  CT angiogram of the chest was negative for pulmonary embolus or dissection.  CTA of the abdomen and pelvis showed patent SMA and celiac axis.  There was occlusion at the IMA origin but the distal arterial tree was patent.  Assessment/Plan: Acute alcoholic pancreatitis -Start trial of clear liquids -Triglyceride 44 -Right upper quadrant ultrasound--s/p chole, normal CBD -Continue IV fluids -Continue Protonix -Discontinue  Zosyn  Atrial flutter/atrial tachycardia -telemetry--personally reviewed-->sinus with PACs -Previously seen by cardiology with CHADSVASc 1-2 but not felt to be good candidate for [facility] due to chronic Etoh -Continue aspirin -Continue Cardizem CD  Abnormal EKG -not consistent with ACS clinically -with minimally elevated troponin and no chest pain-->demand ischemia -repeat EKG--personally reviewed--ST depression unchanged from 2019 -place on tele -troponin 7>>7  Peripheral arterial disease -Continue aspirin -left common femoral to AT bypass with non-reversed ipsilateral great saphenous vein in 2013 by Dr. Bridgett Larsson -left iliofemoral endarterectomy in January 2019 -05/25/2018 arterial ultrasound--patent left CFA-ATA without restenosis  Alcohol abuse and withdrawal -Alcohol withdrawal protocol -pt requiring intermittent ativan  Tobacco abuse I have discussed tobacco cessation with the patient.  I have counseled the patient regarding the negative impacts of continued tobacco use including but not limited to lung cancer, COPD, and cardiovascular disease.  I have discussed alternatives to tobacco and modalities that may help facilitate tobacco cessation including but not limited to biofeedback, hypnosis, and medications.  Total time spent with tobacco counseling was 4 minutes.  Hypertension -continue diltiazem CD  History of seizures -Continue Depakote      Disposition Plan:   Home in 1-2  days  Family Communication:   No Family at bedside  Consultants:  none  Code Status:  FULL   DVT Prophylaxis:  [place] Lovenox   Procedures: As Listed in Progress Note Above  Antibiotics: Zosyn 7/2>>7/3      Subjective: Pt states abd pain is 25% better.  Denies n/v, dysuria, hematuria, hematochezia, melena.  Denies F/C  Objective: Vitals:   10/02/18 0500 10/02/18 0551 10/02/18 0906 10/02/18 1345  BP:  (!) 165/97 (!) 145/94 (!) 142/95  Pulse:  93  82  Resp:  17  17   Temp:  98.3 F (36.8 C)  97.8 F (36.6 C)  TempSrc:      SpO2:  96%  96%  Weight: 66.2 kg     Height:        Intake/Output Summary (Last 24 hours) at 10/02/2018 1415 Last data filed at 10/02/2018 1300 Gross per 24 hour  Intake 547.37 ml  Output --  Net 547.37 ml   Weight change: 2.696 kg Exam:   General:  Pt is alert, follows commands appropriately, not in acute distress  HEENT: No icterus, No thrush, No neck mass, [place]/AT  Cardiovascular: RRR, S1/S2, no rubs, no gallops  Respiratory: CTA bilaterally, no wheezing, no crackles, no rhonchi  Abdomen: Soft/+BS, epigastric tender, non distended, no guarding  Extremities: No edema, No lymphangitis, No petechiae, No rashes, no synovitis   Data Reviewed: I have personally reviewed following labs and imaging studies Basic Metabolic Panel: Recent Labs  Lab 09/30/18 1358 09/30/18 2022 10/01/18 0207 10/02/18 0635  NA 135  --  134* 134*  K 3.5  --  3.8 4.1  CL 96*  --  98 97*  CO2 21*  --  23 23  GLUCOSE 124*  --  135* 67*  BUN 9  --  12 21*  CREATININE 0.73 0.80 0.86 0.74  CALCIUM 9.4  --  8.7* 8.8*   Liver Function Tests: Recent Labs  Lab 09/30/18 1358 10/01/18 0207 10/02/18 0635  AST 25 53* 24  ALT 18 32 23  ALKPHOS 97 105 87  BILITOT 1.3* 1.4* 1.1  PROT 7.9 6.9 6.4*  ALBUMIN 4.5 3.9 3.2*   Recent Labs  Lab 09/30/18 1358 10/01/18 0207  LIPASE 206* 154*   No results for input(s): AMMONIA in the last 168 hours. Coagulation Profile: No results for input(s): INR, PROTIME in the last 168 hours. CBC: Recent Labs  Lab 09/30/18 1504 10/01/18 0207  WBC 11.7* 13.8*  NEUTROABS 9.6*  --   HGB 14.3 14.4  HCT 41.7 42.8  MCV 95.2 97.3  PLT 280 296   Cardiac Enzymes: No results for input(s): CKTOTAL, CKMB, CKMBINDEX, TROPONINI in the last 168 hours. BNP: Invalid input(s): POCBNP CBG: No results for input(s): GLUCAP in the last 168 hours. HbA1C: No results for input(s): HGBA1C in the last 72 hours. Urine  analysis:    Component Value Date/Time   COLORURINE YELLOW 09/30/2018 1938   APPEARANCEUR CLEAR 09/30/2018 1938   LABSPEC 1.016 09/30/2018 1938   PHURINE 8.0 09/30/2018 1938   GLUCOSEU NEGATIVE 09/30/2018 1938   HGBUR NEGATIVE 09/30/2018 1938   BILIRUBINUR NEGATIVE 09/30/2018 1938   KETONESUR 20 (A) 09/30/2018 1938   PROTEINUR 30 (A) 09/30/2018 1938   UROBILINOGEN 0.2 01/05/2015 1316   NITRITE NEGATIVE 09/30/2018 1938   LEUKOCYTESUR NEGATIVE 09/30/2018 1938   Sepsis Labs: @LABRCNTIP (procalcitonin:4,lacticidven:4) ) Recent Results (from the past 240 hour(s))  SARS Coronavirus 2 (CEPHEID - Performed in [place] hospital lab), Hosp Order     Status: None   Collection Time: 09/30/18  3:09 PM   Specimen: Nasopharyngeal Swab  Result Value Ref Range Status   SARS Coronavirus 2 NEGATIVE NEGATIVE Final    Comment: (NOTE) If result is NEGATIVE SARS-CoV-2 target nucleic acids are NOT DETECTED. The SARS-CoV-2 RNA is generally detectable in upper and lower  respiratory specimens  during the acute phase of infection. The lowest  concentration of SARS-CoV-2 viral copies this assay can detect is 250  copies / mL. A negative result does not preclude SARS-CoV-2 infection  and should not be used as the sole basis for treatment or other  patient management decisions.  A negative result may occur with  improper specimen collection / handling, submission of specimen other  than nasopharyngeal swab, presence of viral mutation(s) within the  areas targeted by this assay, and inadequate number of viral copies  (<250 copies / mL). A negative result must be combined with clinical  observations, patient history, and epidemiological information. If result is POSITIVE SARS-CoV-2 target nucleic acids are DETECTED. The SARS-CoV-2 RNA is generally detectable in upper and lower  respiratory specimens dur ing the acute phase of infection.  Positive  results are indicative of active infection with  SARS-CoV-2.  Clinical  correlation with patient history and other diagnostic information is  necessary to determine patient infection status.  Positive results do  not rule out bacterial infection or co-infection with other viruses. If result is PRESUMPTIVE POSTIVE SARS-CoV-2 nucleic acids MAY BE PRESENT.   A presumptive positive result was obtained on the submitted specimen  and confirmed on repeat testing.  While 2019 novel coronavirus  (SARS-CoV-2) nucleic acids may be present in the submitted sample  additional confirmatory testing may be necessary for epidemiological  and / or clinical management purposes  to differentiate between  SARS-CoV-2 and other Sarbecovirus currently known to infect humans.  If clinically indicated additional testing with an alternate test  methodology [phone]) is advised. The SARS-CoV-2 RNA is generally  detectable in upper and lower respiratory sp ecimens during the acute  phase of infection. The expected result is Negative. Fact Sheet for Patients:  StrictlyIdeas.no Fact Sheet for Healthcare Providers: BankingDealers.co.za This test is not yet approved or cleared by the [place] FDA and has been authorized for detection and/or diagnosis of SARS-CoV-2 by FDA under an Emergency Use Authorization (EUA).  This EUA will remain in effect (meaning this test can be used) for the duration of the COVID-19 declaration under Section 564(b)(1) of the Act, 21 U.S.C. section 360bbb-3(b)(1), unless the authorization is terminated or revoked sooner. Performed at [facility], [address] [place], [place] [address]      Scheduled Meds:  aspirin EC  81 mg Oral Daily   cycloSPORINE  1 drop Both Eyes BID   diltiazem  180 mg Oral Daily   divalproex  500 mg Oral QHS   enoxaparin (LOVENOX) injection  40 mg Subcutaneous Q24H   fluticasone  2 spray Each Nare Daily   folic acid  1 mg Oral Daily   gabapentin   300 mg Oral Daily   LORazepam  0-4 mg Intravenous Q6H   Followed by   Derrill Memo ON 10/03/2018] LORazepam  0-4 mg Intravenous Q12H   multivitamin with minerals  1 tablet Oral Daily   pantoprazole  40 mg Oral QAC breakfast   thiamine  100 mg Oral Daily   Or   thiamine  100 mg Intravenous Daily   Continuous Infusions:  Procedures/Studies: Ct Angio Chest/abd/pel For Dissection W And/or Wo Contrast  Result Date: 09/30/2018 CLINICAL DATA:  Left flank pain.  History of pancreatitis. EXAM: CT ANGIOGRAPHY CHEST, ABDOMEN AND PELVIS TECHNIQUE: Multidetector CT imaging through the chest, abdomen and pelvis was performed using the standard protocol during bolus administration of intravenous contrast. Multiplanar reconstructed images and MIPs were obtained and reviewed to evaluate  the vascular anatomy. CONTRAST:  100 mL OMNIPAQUE IOHEXOL 350 MG/ML SOLN COMPARISON:  CT abdomen pelvis 04/12/2018 FINDINGS: CTA CHEST FINDINGS Cardiovascular: --Heart: The heart size is normal. There is nopericardial effusion. There are coronary artery calcifications. --Aorta: The course and caliber of the thoracic aorta are normal. There is mild aortic atherosclerotic calcification. Precontrast images show no aortic intramural hematoma. There is no blood pool, dissection or penetrating ulcer demonstrated on arterial phase postcontrast imaging. There is a conventional 3 vessel aortic arch branching pattern. The proximal arch vessels are widely patent. --Pulmonary Arteries: Contrast timing is optimized for preferential opacification of the aorta. Within that limitation, normal central pulmonary arteries. Mediastinum/Nodes: No mediastinal, hilar or axillary lymphadenopathy. The visualized thyroid and thoracic esophageal course are unremarkable. Lungs/Pleura: There is biapical bullous emphysema. No pleural effusion or pneumothorax. No focal airspace consolidation or pulmonary edema. Musculoskeletal: No chest wall abnormality. No acute  osseous findings. Review of the MIP images confirms the above findings. CTA ABDOMEN AND PELVIS FINDINGS VASCULAR Aorta: Normal caliber aorta without aneurysm, dissection, vasculitis or hemodynamically significant stenosis. There is moderate aortic atherosclerosis. Celiac: No aneurysm, dissection or hemodynamically significant stenosis. Normal branching pattern. SMA: Widely patent without dissection or stenosis. Renals: Paired right and single left renal arteries. No aneurysm, dissection, stenosis or evidence of fibromuscular dysplasia. IMA: Origin is occluded but the distal arteries patent. Inflow: Moderate atherosclerotic calcification. No aneurysm, dissection or high-grade stenosis. Status post left femoral endarterectomy. Veins: Incidentally noted retro iliac left renal vein Review of the MIP images confirms the above findings. NON-VASCULAR Hepatobiliary: Normal hepatic contours and density. No visible biliary dilatation. Status post cholecystectomy. Pancreas: There is moderate stranding surrounding the pancreatic body and tail. There is no walled-off fluid collection. No evidence of pancreatic necrosis or hemorrhage. Spleen: Multiple calcified granulomata Adrenals/Urinary Tract: --Adrenal glands: Normal. --Right kidney/ureter: No hydronephrosis or perinephric stranding. No nephrolithiasis. No obstructing ureteral stones. --Left kidney/ureter: No hydronephrosis or perinephric stranding. No nephrolithiasis. No obstructing ureteral stones. --Urinary bladder: Unremarkable. Stomach/Bowel: --Stomach/Duodenum: No hiatal hernia or other gastric abnormality. Normal duodenal course and caliber. --Small bowel: No dilatation or inflammation. --Colon: No focal abnormality. --Appendix: Normal. Lymphatic:  No abdominal or pelvic lymphadenopathy. Reproductive: There are prosthetic calcifications. Musculoskeletal. No bony spinal canal stenosis or focal osseous abnormality. Other: None. Review of the MIP images confirms the above  findings. IMPRESSION: 1. No acute aortic syndrome.  Aortic atherosclerosis (ICD10-I70.0). 2. Acute pancreatitis without pseudocyst or defined fluid collection. 3.  Emphysema (ICD10-J43.9). Electronically Signed   By: Ulyses Jarred M.D.   On: 09/30/2018 20:18   US Abdomen Limited Ruq  Result Date: 10/01/2018 CLINICAL DATA:  Patient with history of acute pancreatitis. Prior cholecystectomy. EXAM: ULTRASOUND ABDOMEN LIMITED RIGHT UPPER QUADRANT COMPARISON:  CT abdomen pelvis 04/12/2018 FINDINGS: Gallbladder: Surgically absent Common bile duct: Diameter: 2 mm Liver: No focal lesion identified. Within normal limits in parenchymal echogenicity. Portal vein is patent on color Doppler imaging with normal direction of blood flow towards the liver. IMPRESSION: Gallbladder is surgically absent. Common bile duct is normal in diameter. Electronically Signed   By: Lovey Newcomer M.D.   On: 10/01/2018 12:51    Orson Eva, DO  [facility] Pager [phone]  If 7PM-7AM, please contact night-coverage [email or web address] Password [email or web address] 10/02/2018, 2:15 PM   LOS: 2 days

## 2018-10-03 DIAGNOSIS — F10239 Alcohol dependence with withdrawal, unspecified: Secondary | ICD-10-CM

## 2018-10-03 LAB — CBC
HCT: 36.3 % — ABNORMAL LOW (ref 39.0–52.0)
Hemoglobin: 12.2 g/dL — ABNORMAL LOW (ref 13.0–17.0)
MCH: 32.7 pg (ref 26.0–34.0)
MCHC: 33.6 g/dL (ref 30.0–36.0)
MCV: 97.3 fL (ref 80.0–100.0)
Platelets: 221 10*3/uL (ref 150–400)
RBC: 3.73 MIL/uL — ABNORMAL LOW (ref 4.22–5.81)
RDW: 11.9 % (ref 11.5–15.5)
WBC: 7 10*3/uL (ref 4.0–10.5)
nRBC: 0 % (ref 0.0–0.2)

## 2018-10-03 LAB — BASIC METABOLIC PANEL
Anion gap: 9 (ref 5–15)
BUN: 12 mg/dL (ref 6–20)
CO2: 24 mmol/L (ref 22–32)
Calcium: 8.5 mg/dL — ABNORMAL LOW (ref 8.9–10.3)
Chloride: 97 mmol/L — ABNORMAL LOW (ref 98–111)
Creatinine, Ser: 0.57 mg/dL — ABNORMAL LOW (ref 0.61–1.24)
GFR calc Af Amer: >60 mL/min (ref >60)
GFR calc non Af Amer: >60 mL/min (ref >60)
Glucose, Bld: 111 mg/dL — ABNORMAL HIGH (ref 70–99)
Potassium: 3.9 mmol/L (ref 3.5–5.1)
Sodium: 130 mmol/L — ABNORMAL LOW (ref 135–145)

## 2018-10-03 MED ORDER — POLYETHYLENE GLYCOL 3350 17 G PO PACK
17.0000 g | PACK | Freq: Every day | ORAL | Status: DC
  Administered 2018-10-03 – 2018-10-04 (×2): 17 g via ORAL
  Filled 2018-10-03 (×2): qty 1

## 2018-10-03 MED ORDER — SENNA 8.6 MG PO TABS
2.0000 | ORAL_TABLET | Freq: Every day | ORAL | Status: DC
  Administered 2018-10-03 – 2018-10-04 (×2): 17.2 mg via ORAL
  Filled 2018-10-03 (×2): qty 2

## 2018-10-03 NOTE — Discharge Summary (Signed)
Physician Discharge Summary  Brady Bell MOQ:[id number] DOB: 01/21/1956 DOA: 09/30/2018  PCP: Sinda Du, MD  Admit date: 09/30/2018 Discharge date: 10/04/2018  Admitted From: Home Disposition:  Home   Recommendations for Outpatient Follow-up:  1. Follow up with PCP in 1-2 weeks 2. Please obtain BMP/CBC in one week    Discharge Condition: Stable CODE STATUS: FULL Diet recommendation: Heart Healthy   Brief/Interim Summary: 63 year old male with a history of GERD, alcohol abuse, atrial flutter, peripheral arterial disease, remote seizure, tobacco abuse, hypertension, pancreatitis presenting with left flank and left lower quadrant pain that developed after an episode of nausea and vomiting on 09/30/2018. The patient continues to drink 5-7 mixed drinks on a daily basis with shots of vodka. He denies any illicit drug use. He continues to smoke 3 to 5 cigarettes/day. He denies any new medications or over-the-counter supplements. He has some subjective fevers and chills at home, but denied any headache, neck pain, chest pain, diarrhea, dysuria, hematuria. He has not had any hematochezia or melena. He does note some increase in dyspnea on exertion over the past 2 to 3 days. He has not had any hemoptysis. Patient had a recent mission to the hospital from 04/12/2018 to 04/16/2018 when he was managed for acute pancreatitis. He had an episode of atrial flutter at that time and was seen by cardiology. He was felt to be a poor candidate for anticoagulation in the setting of his alcohol use. In the emergency department, the patient was afebrile hemodynamically stable saturating 97% room air. WBC was 11.7 with unremarkable BMP. AST 53, ALT 32, alk phosphatase 105, total bili 1.4, lipase 206. CT of the abdomen and pelvis showed moderate stranding of the pancreatic body and tail. CT angiogram of the chest was negative for pulmonary embolus or dissection. CTA of the abdomen and pelvis showed  patent SMA and celiac axis. There was occlusion at the IMA origin but the distal arterial tree was patent.  Discharge Diagnoses:  Acute alcoholic pancreatitis -advance to full liquids>>>soft diet which pt tolerated -Triglyceride 44 -Right upper quadrant ultrasound--s/p chole, normal CBD -Continue IV fluids -Continue Protonix -Discontinue Zosyn  Atrial flutter/atrial tachycardia -telemetry--personally reviewed-->sinus with PACs -Previously seen by cardiologywith [facility]c 1-2 but not felt to be good candidate for [facility] due to chronic Etoh -Continue aspirin -Continue Cardizem CD -remained in sinus  Abnormal EKG -not consistent with ACS clinically -with minimally elevated troponin and no chest pain-->demand ischemia -repeat EKG--personally reviewed--ST depression unchanged from 2019 -place on tele -troponin 7>>7  Peripheral arterial disease -Continue aspirin -left common femoral to AT bypass with non-reversed ipsilateral great saphenous vein in 2013 by Dr. Bridgett Larsson -left iliofemoral endarterectomy in January 2019 -05/25/2018 arterial ultrasound--patent left CFA-ATA without restenosis  Alcohol abuseand withdrawal -Alcohol withdrawal protocol -pt requiring intermittent ativan but none for ~24 hours prior to d/c  Tobacco abuse I have discussed tobacco cessation with the patient. I have counseled the patient regarding the negative impacts of continued tobacco use including but not limited to lung cancer, COPD, and cardiovascular disease. I have discussed alternatives to tobacco and modalities that may help facilitate tobacco cessation including but not limited to biofeedback, hypnosis, and medications. Total time spent with tobacco counseling was 4 minutes.  Hypertension -continue diltiazem CD  History of seizures -Continue Depakote    Discharge Instructions   Allergies as of 10/03/2018   No Known Allergies     Medication List    TAKE these medications   aspirin  EC 81 MG tablet Take  81 mg by mouth daily.   cycloSPORINE 0.05 % ophthalmic emulsion Commonly known as: RESTASIS Place 1 drop into both eyes 2 (two) times daily.   diltiazem 180 MG 24 hr capsule Commonly known as: CARDIZEM CD Take 1 capsule (180 mg total) by mouth daily.   divalproex 500 MG 24 hr tablet Commonly known as: DEPAKOTE ER Take one tab at bedtime.  Please call [phone] to schedule yearly follow up.   eletriptan 40 MG tablet Commonly known as: RELPAX Take 40 mg by mouth every 2 (two) hours as needed for migraine or headache. May repeat in 2 hours if headache persists or recurs.   fluticasone 50 MCG/ACT nasal spray Commonly known as: FLONASE Place 2 sprays into both nostrils daily.   gabapentin 300 MG capsule Commonly known as: NEURONTIN Take 300 mg by mouth daily.   HYDROcodone-acetaminophen 5-325 MG tablet Commonly known as: NORCO/VICODIN Take 1 tablet by mouth every 6 (six) hours as needed for moderate pain.   multivitamin with minerals Tabs tablet Take 1 tablet by mouth daily.   nicotine 21 mg/24hr patch Commonly known as: NICODERM CQ - dosed in mg/24 hours Place 1 patch (21 mg total) onto the skin daily.   pantoprazole 40 MG tablet Commonly known as: PROTONIX Take 1 tablet (40 mg total) by mouth daily before breakfast.   SUMAtriptan 100 MG tablet Commonly known as: IMITREX Take 1 tablet (100 mg total) by mouth every 2 (two) hours as needed for migraine. May repeat in 2 hours if headache persists or recurs.   thiamine 100 MG tablet Take 1 tablet (100 mg total) by mouth daily.   traZODone 50 MG tablet Commonly known as: DESYREL Take 50 mg by mouth at bedtime.       No Known Allergies  Consultations:  none   Procedures/Studies: Ct Angio Chest/abd/pel For Dissection W And/or Wo Contrast  Result Date: 09/30/2018 CLINICAL DATA:  Left flank pain.  History of pancreatitis. EXAM: CT ANGIOGRAPHY CHEST, ABDOMEN AND PELVIS TECHNIQUE: Multidetector CT  imaging through the chest, abdomen and pelvis was performed using the standard protocol during bolus administration of intravenous contrast. Multiplanar reconstructed images and MIPs were obtained and reviewed to evaluate the vascular anatomy. CONTRAST:  100 mL OMNIPAQUE IOHEXOL 350 MG/ML SOLN COMPARISON:  CT abdomen pelvis 04/12/2018 FINDINGS: CTA CHEST FINDINGS Cardiovascular: --Heart: The heart size is normal. There is nopericardial effusion. There are coronary artery calcifications. --Aorta: The course and caliber of the thoracic aorta are normal. There is mild aortic atherosclerotic calcification. Precontrast images show no aortic intramural hematoma. There is no blood pool, dissection or penetrating ulcer demonstrated on arterial phase postcontrast imaging. There is a conventional 3 vessel aortic arch branching pattern. The proximal arch vessels are widely patent. --Pulmonary Arteries: Contrast timing is optimized for preferential opacification of the aorta. Within that limitation, normal central pulmonary arteries. Mediastinum/Nodes: No mediastinal, hilar or axillary lymphadenopathy. The visualized thyroid and thoracic esophageal course are unremarkable. Lungs/Pleura: There is biapical bullous emphysema. No pleural effusion or pneumothorax. No focal airspace consolidation or pulmonary edema. Musculoskeletal: No chest wall abnormality. No acute osseous findings. Review of the MIP images confirms the above findings. CTA ABDOMEN AND PELVIS FINDINGS VASCULAR Aorta: Normal caliber aorta without aneurysm, dissection, vasculitis or hemodynamically significant stenosis. There is moderate aortic atherosclerosis. Celiac: No aneurysm, dissection or hemodynamically significant stenosis. Normal branching pattern. SMA: Widely patent without dissection or stenosis. Renals: Paired right and single left renal arteries. No aneurysm, dissection, stenosis or evidence of fibromuscular dysplasia. IMA:  Origin is occluded but the  distal arteries patent. Inflow: Moderate atherosclerotic calcification. No aneurysm, dissection or high-grade stenosis. Status post left femoral endarterectomy. Veins: Incidentally noted retro iliac left renal vein Review of the MIP images confirms the above findings. NON-VASCULAR Hepatobiliary: Normal hepatic contours and density. No visible biliary dilatation. Status post cholecystectomy. Pancreas: There is moderate stranding surrounding the pancreatic body and tail. There is no walled-off fluid collection. No evidence of pancreatic necrosis or hemorrhage. Spleen: Multiple calcified granulomata Adrenals/Urinary Tract: --Adrenal glands: Normal. --Right kidney/ureter: No hydronephrosis or perinephric stranding. No nephrolithiasis. No obstructing ureteral stones. --Left kidney/ureter: No hydronephrosis or perinephric stranding. No nephrolithiasis. No obstructing ureteral stones. --Urinary bladder: Unremarkable. Stomach/Bowel: --Stomach/Duodenum: No hiatal hernia or other gastric abnormality. Normal duodenal course and caliber. --Small bowel: No dilatation or inflammation. --Colon: No focal abnormality. --Appendix: Normal. Lymphatic:  No abdominal or pelvic lymphadenopathy. Reproductive: There are prosthetic calcifications. Musculoskeletal. No bony spinal canal stenosis or focal osseous abnormality. Other: None. Review of the MIP images confirms the above findings. IMPRESSION: 1. No acute aortic syndrome.  Aortic atherosclerosis (ICD10-I70.0). 2. Acute pancreatitis without pseudocyst or defined fluid collection. 3.  Emphysema (ICD10-J43.9). Electronically Signed   By: Ulyses Jarred M.D.   On: 09/30/2018 20:18   US Abdomen Limited Ruq  Result Date: 10/01/2018 CLINICAL DATA:  Patient with history of acute pancreatitis. Prior cholecystectomy. EXAM: ULTRASOUND ABDOMEN LIMITED RIGHT UPPER QUADRANT COMPARISON:  CT abdomen pelvis 04/12/2018 FINDINGS: Gallbladder: Surgically absent Common bile duct: Diameter: 2 mm Liver:  No focal lesion identified. Within normal limits in parenchymal echogenicity. Portal vein is patent on color Doppler imaging with normal direction of blood flow towards the liver. IMPRESSION: Gallbladder is surgically absent. Common bile duct is normal in diameter. Electronically Signed   By: Lovey Newcomer M.D.   On: 10/01/2018 12:51        Discharge Exam: Vitals:   10/03/18 1000 10/03/18 1454  BP: (!) 147/87 (!) 161/89  Pulse: 69 68  Resp:    Temp:  97.7 F (36.5 C)  SpO2: 96% 95%   Vitals:   10/03/18 0618 10/03/18 0619 10/03/18 1000 10/03/18 1454  BP: 95/64 95/64 (!) 147/87 (!) 161/89  Pulse: (!) 104 (!) 103 69 68  Resp: 18 18    Temp: 98.7 F (37.1 C) 98.7 F (37.1 C)  97.7 F (36.5 C)  TempSrc: Oral Oral  Oral  SpO2: 98% 97% 96% 95%  Weight:  64.3 kg    Height:        General: Pt is alert, awake, not in acute distress Cardiovascular: RRR, S1/S2 +, no rubs, no gallops Respiratory: CTA bilaterally, no wheezing, no rhonchi Abdominal: Soft, NT, ND, bowel sounds + Extremities: no edema, no cyanosis   The results of significant diagnostics from this hospitalization (including imaging, microbiology, ancillary and laboratory) are listed below for reference.    Significant Diagnostic Studies: Ct Angio Chest/abd/pel For Dissection W And/or Wo Contrast  Result Date: 09/30/2018 CLINICAL DATA:  Left flank pain.  History of pancreatitis. EXAM: CT ANGIOGRAPHY CHEST, ABDOMEN AND PELVIS TECHNIQUE: Multidetector CT imaging through the chest, abdomen and pelvis was performed using the standard protocol during bolus administration of intravenous contrast. Multiplanar reconstructed images and MIPs were obtained and reviewed to evaluate the vascular anatomy. CONTRAST:  100 mL OMNIPAQUE IOHEXOL 350 MG/ML SOLN COMPARISON:  CT abdomen pelvis 04/12/2018 FINDINGS: CTA CHEST FINDINGS Cardiovascular: --Heart: The heart size is normal. There is nopericardial effusion. There are coronary artery  calcifications. --Aorta: The course and  caliber of the thoracic aorta are normal. There is mild aortic atherosclerotic calcification. Precontrast images show no aortic intramural hematoma. There is no blood pool, dissection or penetrating ulcer demonstrated on arterial phase postcontrast imaging. There is a conventional 3 vessel aortic arch branching pattern. The proximal arch vessels are widely patent. --Pulmonary Arteries: Contrast timing is optimized for preferential opacification of the aorta. Within that limitation, normal central pulmonary arteries. Mediastinum/Nodes: No mediastinal, hilar or axillary lymphadenopathy. The visualized thyroid and thoracic esophageal course are unremarkable. Lungs/Pleura: There is biapical bullous emphysema. No pleural effusion or pneumothorax. No focal airspace consolidation or pulmonary edema. Musculoskeletal: No chest wall abnormality. No acute osseous findings. Review of the MIP images confirms the above findings. CTA ABDOMEN AND PELVIS FINDINGS VASCULAR Aorta: Normal caliber aorta without aneurysm, dissection, vasculitis or hemodynamically significant stenosis. There is moderate aortic atherosclerosis. Celiac: No aneurysm, dissection or hemodynamically significant stenosis. Normal branching pattern. SMA: Widely patent without dissection or stenosis. Renals: Paired right and single left renal arteries. No aneurysm, dissection, stenosis or evidence of fibromuscular dysplasia. IMA: Origin is occluded but the distal arteries patent. Inflow: Moderate atherosclerotic calcification. No aneurysm, dissection or high-grade stenosis. Status post left femoral endarterectomy. Veins: Incidentally noted retro iliac left renal vein Review of the MIP images confirms the above findings. NON-VASCULAR Hepatobiliary: Normal hepatic contours and density. No visible biliary dilatation. Status post cholecystectomy. Pancreas: There is moderate stranding surrounding the pancreatic body and tail.  There is no walled-off fluid collection. No evidence of pancreatic necrosis or hemorrhage. Spleen: Multiple calcified granulomata Adrenals/Urinary Tract: --Adrenal glands: Normal. --Right kidney/ureter: No hydronephrosis or perinephric stranding. No nephrolithiasis. No obstructing ureteral stones. --Left kidney/ureter: No hydronephrosis or perinephric stranding. No nephrolithiasis. No obstructing ureteral stones. --Urinary bladder: Unremarkable. Stomach/Bowel: --Stomach/Duodenum: No hiatal hernia or other gastric abnormality. Normal duodenal course and caliber. --Small bowel: No dilatation or inflammation. --Colon: No focal abnormality. --Appendix: Normal. Lymphatic:  No abdominal or pelvic lymphadenopathy. Reproductive: There are prosthetic calcifications. Musculoskeletal. No bony spinal canal stenosis or focal osseous abnormality. Other: None. Review of the MIP images confirms the above findings. IMPRESSION: 1. No acute aortic syndrome.  Aortic atherosclerosis (ICD10-I70.0). 2. Acute pancreatitis without pseudocyst or defined fluid collection. 3.  Emphysema (ICD10-J43.9). Electronically Signed   By: Ulyses Jarred M.D.   On: 09/30/2018 20:18   US Abdomen Limited Ruq  Result Date: 10/01/2018 CLINICAL DATA:  Patient with history of acute pancreatitis. Prior cholecystectomy. EXAM: ULTRASOUND ABDOMEN LIMITED RIGHT UPPER QUADRANT COMPARISON:  CT abdomen pelvis 04/12/2018 FINDINGS: Gallbladder: Surgically absent Common bile duct: Diameter: 2 mm Liver: No focal lesion identified. Within normal limits in parenchymal echogenicity. Portal vein is patent on color Doppler imaging with normal direction of blood flow towards the liver. IMPRESSION: Gallbladder is surgically absent. Common bile duct is normal in diameter. Electronically Signed   By: Lovey Newcomer M.D.   On: 10/01/2018 12:51     Microbiology: Recent Results (from the past 240 hour(s))  SARS Coronavirus 2 (CEPHEID - Performed in [place] hospital lab),  Hosp Order     Status: None   Collection Time: 09/30/18  3:09 PM   Specimen: Nasopharyngeal Swab  Result Value Ref Range Status   SARS Coronavirus 2 NEGATIVE NEGATIVE Final    Comment: (NOTE) If result is NEGATIVE SARS-CoV-2 target nucleic acids are NOT DETECTED. The SARS-CoV-2 RNA is generally detectable in upper and lower  respiratory specimens during the acute phase of infection. The lowest  concentration of SARS-CoV-2 viral copies this assay can detect  is 250  copies / mL. A negative result does not preclude SARS-CoV-2 infection  and should not be used as the sole basis for treatment or other  patient management decisions.  A negative result may occur with  improper specimen collection / handling, submission of specimen other  than nasopharyngeal swab, presence of viral mutation(s) within the  areas targeted by this assay, and inadequate number of viral copies  (<250 copies / mL). A negative result must be combined with clinical  observations, patient history, and epidemiological information. If result is POSITIVE SARS-CoV-2 target nucleic acids are DETECTED. The SARS-CoV-2 RNA is generally detectable in upper and lower  respiratory specimens dur ing the acute phase of infection.  Positive  results are indicative of active infection with SARS-CoV-2.  Clinical  correlation with patient history and other diagnostic information is  necessary to determine patient infection status.  Positive results do  not rule out bacterial infection or co-infection with other viruses. If result is PRESUMPTIVE POSTIVE SARS-CoV-2 nucleic acids MAY BE PRESENT.   A presumptive positive result was obtained on the submitted specimen  and confirmed on repeat testing.  While 2019 novel coronavirus  (SARS-CoV-2) nucleic acids may be present in the submitted sample  additional confirmatory testing may be necessary for epidemiological  and / or clinical management purposes  to differentiate between    SARS-CoV-2 and other Sarbecovirus currently known to infect humans.  If clinically indicated additional testing with an alternate test  methodology [phone]) is advised. The SARS-CoV-2 RNA is generally  detectable in upper and lower respiratory sp ecimens during the acute  phase of infection. The expected result is Negative. Fact Sheet for Patients:  StrictlyIdeas.no Fact Sheet for Healthcare Providers: BankingDealers.co.za This test is not yet approved or cleared by the [place] FDA and has been authorized for detection and/or diagnosis of SARS-CoV-2 by FDA under an Emergency Use Authorization (EUA).  This EUA will remain in effect (meaning this test can be used) for the duration of the COVID-19 declaration under Section 564(b)(1) of the Act, 21 U.S.C. section 360bbb-3(b)(1), unless the authorization is terminated or revoked sooner. Performed at [facility], [address], [place], [place] [address]      Labs: Basic Metabolic Panel: Recent Labs  Lab 09/30/18 1358 09/30/18 2022 10/01/18 0207 10/02/18 0635 10/03/18 0642  NA 135  --  134* 134* 130*  K 3.5  --  3.8 4.1 3.9  CL 96*  --  98 97* 97*  CO2 21*  --  23 23 24   GLUCOSE 124*  --  135* 67* 111*  BUN 9  --  12 21* 12  CREATININE 0.73 0.80 0.86 0.74 0.57*  CALCIUM 9.4  --  8.7* 8.8* 8.5*   Liver Function Tests: Recent Labs  Lab 09/30/18 1358 10/01/18 0207 10/02/18 0635  AST 25 53* 24  ALT 18 32 23  ALKPHOS 97 105 87  BILITOT 1.3* 1.4* 1.1  PROT 7.9 6.9 6.4*  ALBUMIN 4.5 3.9 3.2*   Recent Labs  Lab 09/30/18 1358 10/01/18 0207  LIPASE 206* 154*   No results for input(s): AMMONIA in the last 168 hours. CBC: Recent Labs  Lab 09/30/18 1504 10/01/18 0207 10/03/18 0642  WBC 11.7* 13.8* 7.0  NEUTROABS 9.6*  --   --   HGB 14.3 14.4 12.2*  HCT 41.7 42.8 36.3*  MCV 95.2 97.3 97.3  PLT 280 296 221   Cardiac Enzymes: No results for input(s): CKTOTAL,  CKMB, CKMBINDEX, TROPONINI in the  last 168 hours. BNP: Invalid input(s): POCBNP CBG: No results for input(s): GLUCAP in the last 168 hours.  Time coordinating discharge:  36 minutes  Signed:  Orson Eva, DO [facility] Pager: [phone] 10/03/2018, 5:24 PM

## 2018-10-03 NOTE — Progress Notes (Signed)
PROGRESS NOTE  Brady Bell OMB:[id number] DOB: 1956/03/15 DOA: 09/30/2018 PCP: Sinda Du, MD  Brief History: 63 year old male with a history of GERD, alcohol abuse, atrial flutter, peripheral arterial disease, remote seizure, tobacco abuse, hypertension, pancreatitis presenting with left flank and left lower quadrant pain that developed after an episode of nausea and vomiting on 09/30/2018. The patient continues to drink 5-7 mixed drinks on a daily basis with shots of vodka. He denies any illicit drug use. He continues to smoke 3 to 5 cigarettes/day. He denies any new medications or over-the-counter supplements. He has some subjective fevers and chills at home, but denied any headache, neck pain, chest pain, diarrhea, dysuria, hematuria. He has not had any hematochezia or melena. He does note some increase in dyspnea on exertion over the past 2 to 3 days. He has not had any hemoptysis. Patient had a recent mission to the hospital from 04/12/2018 to 04/16/2018 when he was managed for acute pancreatitis. He had an episode of atrial flutter at that time and was seen by cardiology. He was felt to be a poor candidate for anticoagulation in the setting of his alcohol use. In the emergency department, the patient was afebrile hemodynamically stable saturating 97% room air. WBC was 11.7 with unremarkable BMP. AST 53, ALT 32, alk phosphatase 105, total bili 1.4, lipase 206. CT of the abdomen and pelvis showed moderate stranding of the pancreatic body and tail. CT angiogram of the chest was negative for pulmonary embolus or dissection. CTA of the abdomen and pelvis showed patent SMA and celiac axis. There was occlusion at the IMA origin but the distal arterial tree was patent.  Assessment/Plan: Acute alcoholic pancreatitis -advance to full liquids -Triglyceride 44 -Right upper quadrant ultrasound--s/p chole, normal CBD -Continue IV fluids -Continue Protonix -Discontinue  Zosyn  Atrial flutter/atrial tachycardia -telemetry--personally reviewed-->sinus with PACs -Previously seen by cardiologywith [facility] 1-2 but not felt to be good candidate for [facility] due to chronic Etoh -Continue aspirin -Continue Cardizem CD  Abnormal EKG -not consistent with ACS clinically -with minimally elevated troponin and no chest pain-->demand ischemia -repeat EKG--personally reviewed--ST depression unchanged from 2019 -place on tele -troponin 7>>7  Peripheral arterial disease -Continue aspirin -left common femoral to AT bypass with non-reversed ipsilateral great saphenous vein in 2013 by Dr. Bridgett Larsson -left iliofemoral endarterectomy in January 2019 -05/25/2018 arterial ultrasound--patent left CFA-ATA without restenosis  Alcohol abuse and withdrawal -Alcohol withdrawal protocol -pt requiring intermittent ativan  Tobacco abuse I have discussed tobacco cessation with the patient. I have counseled the patient regarding the negative impacts of continued tobacco use including but not limited to lung cancer, COPD, and cardiovascular disease. I have discussed alternatives to tobacco and modalities that may help facilitate tobacco cessation including but not limited to biofeedback, hypnosis, and medications. Total time spent with tobacco counseling was 4 minutes.  Hypertension -continue diltiazem CD  History of seizures -Continue Depakote      Disposition Plan: Home 7/6 if stable Family Communication:NoFamily at bedside  Consultants:none  Code Status: FULL   DVT Prophylaxis: [place] Lovenox   Procedures: As Listed in Progress Note Above  Antibiotics: Zosyn 7/2>>7/3       Subjective: Pt is feeling better.  Still has some mild abd pain. No BM but passing flatus.  Denies f/c, cp, sob, n/v/d.  Objective: Vitals:   10/03/18 0618 10/03/18 0619 10/03/18 1000 10/03/18 1454  BP: 95/64 95/64 (!) 147/87 (!) 161/89  Pulse: (!) 104 (!) 103  69 68  Resp: 18 18    Temp: 98.7 F (37.1 C) 98.7 F (37.1 C)  97.7 F (36.5 C)  TempSrc: Oral Oral  Oral  SpO2: 98% 97% 96% 95%  Weight:  64.3 kg    Height:        Intake/Output Summary (Last 24 hours) at 10/03/2018 1613 Last data filed at 10/03/2018 1455 Gross per 24 hour  Intake 2503.39 ml  Output --  Net 2503.39 ml   Weight change: -1.9 kg Exam:   General:  Pt is alert, follows commands appropriately, not in acute distress  HEENT: No icterus, No thrush, No neck mass, /AT  Cardiovascular: RRR, S1/S2, no rubs, no gallops  Respiratory: bibasilar rales. No wheeze  Abdomen: Soft/+BS, mild epigastric tender, non distended, no guarding  Extremities: No edema, No lymphangitis, No petechiae, No rashes, no synovitis   Data Reviewed: I have personally reviewed following labs and imaging studies Basic Metabolic Panel: Recent Labs  Lab 09/30/18 1358 09/30/18 2022 10/01/18 0207 10/02/18 0635 10/03/18 0642  NA 135  --  134* 134* 130*  K 3.5  --  3.8 4.1 3.9  CL 96*  --  98 97* 97*  CO2 21*  --  23 23 24   GLUCOSE 124*  --  135* 67* 111*  BUN 9  --  12 21* 12  CREATININE 0.73 0.80 0.86 0.74 0.57*  CALCIUM 9.4  --  8.7* 8.8* 8.5*   Liver Function Tests: Recent Labs  Lab 09/30/18 1358 10/01/18 0207 10/02/18 0635  AST 25 53* 24  ALT 18 32 23  ALKPHOS 97 105 87  BILITOT 1.3* 1.4* 1.1  PROT 7.9 6.9 6.4*  ALBUMIN 4.5 3.9 3.2*   Recent Labs  Lab 09/30/18 1358 10/01/18 0207  LIPASE 206* 154*   No results for input(s): AMMONIA in the last 168 hours. Coagulation Profile: No results for input(s): INR, PROTIME in the last 168 hours. CBC: Recent Labs  Lab 09/30/18 1504 10/01/18 0207 10/03/18 0642  WBC 11.7* 13.8* 7.0  NEUTROABS 9.6*  --   --   HGB 14.3 14.4 12.2*  HCT 41.7 42.8 36.3*  MCV 95.2 97.3 97.3  PLT 280 296 221   Cardiac Enzymes: No results for input(s): CKTOTAL, CKMB, CKMBINDEX, TROPONINI in the last 168 hours. BNP: Invalid input(s):  POCBNP CBG: No results for input(s): GLUCAP in the last 168 hours. HbA1C: No results for input(s): HGBA1C in the last 72 hours. Urine analysis:    Component Value Date/Time   COLORURINE YELLOW 09/30/2018 1938   APPEARANCEUR CLEAR 09/30/2018 1938   LABSPEC 1.016 09/30/2018 1938   PHURINE 8.0 09/30/2018 1938   GLUCOSEU NEGATIVE 09/30/2018 1938   HGBUR NEGATIVE 09/30/2018 1938   BILIRUBINUR NEGATIVE 09/30/2018 1938   KETONESUR 20 (A) 09/30/2018 1938   PROTEINUR 30 (A) 09/30/2018 1938   UROBILINOGEN 0.2 01/05/2015 1316   NITRITE NEGATIVE 09/30/2018 1938   LEUKOCYTESUR NEGATIVE 09/30/2018 1938   Sepsis Labs: @LABRCNTIP (procalcitonin:4,lacticidven:4) ) Recent Results (from the past 240 hour(s))  SARS Coronavirus 2 (CEPHEID - Performed in [place] hospital lab), Hosp Order     Status: None   Collection Time: 09/30/18  3:09 PM   Specimen: Nasopharyngeal Swab  Result Value Ref Range Status   SARS Coronavirus 2 NEGATIVE NEGATIVE Final    Comment: (NOTE) If result is NEGATIVE SARS-CoV-2 target nucleic acids are NOT DETECTED. The SARS-CoV-2 RNA is generally detectable in upper and lower  respiratory specimens during the acute phase of infection. The lowest  concentration of SARS-CoV-2 viral copies this assay can detect is 250  copies / mL. A negative result does not preclude SARS-CoV-2 infection  and should not be used as the sole basis for treatment or other  patient management decisions.  A negative result may occur with  improper specimen collection / handling, submission of specimen other  than nasopharyngeal swab, presence of viral mutation(s) within the  areas targeted by this assay, and inadequate number of viral copies  (<250 copies / mL). A negative result must be combined with clinical  observations, patient history, and epidemiological information. If result is POSITIVE SARS-CoV-2 target nucleic acids are DETECTED. The SARS-CoV-2 RNA is generally detectable in upper  and lower  respiratory specimens dur ing the acute phase of infection.  Positive  results are indicative of active infection with SARS-CoV-2.  Clinical  correlation with patient history and other diagnostic information is  necessary to determine patient infection status.  Positive results do  not rule out bacterial infection or co-infection with other viruses. If result is PRESUMPTIVE POSTIVE SARS-CoV-2 nucleic acids MAY BE PRESENT.   A presumptive positive result was obtained on the submitted specimen  and confirmed on repeat testing.  While 2019 novel coronavirus  (SARS-CoV-2) nucleic acids may be present in the submitted sample  additional confirmatory testing may be necessary for epidemiological  and / or clinical management purposes  to differentiate between  SARS-CoV-2 and other Sarbecovirus currently known to infect humans.  If clinically indicated additional testing with an alternate test  methodology [phone]) is advised. The SARS-CoV-2 RNA is generally  detectable in upper and lower respiratory sp ecimens during the acute  phase of infection. The expected result is Negative. Fact Sheet for Patients:  StrictlyIdeas.no Fact Sheet for Healthcare Providers: BankingDealers.co.za This test is not yet approved or cleared by the [place] FDA and has been authorized for detection and/or diagnosis of SARS-CoV-2 by FDA under an Emergency Use Authorization (EUA).  This EUA will remain in effect (meaning this test can be used) for the duration of the COVID-19 declaration under Section 564(b)(1) of the Act, 21 U.S.C. section 360bbb-3(b)(1), unless the authorization is terminated or revoked sooner. Performed at [facility], [address], [place], [place] [address]      Scheduled Meds:  aspirin EC  81 mg Oral Daily   cycloSPORINE  1 drop Both Eyes BID   diltiazem  180 mg Oral Daily   divalproex  500 mg Oral QHS   enoxaparin  (LOVENOX) injection  40 mg Subcutaneous Q24H   fluticasone  2 spray Each Nare Daily   folic acid  1 mg Oral Daily   gabapentin  300 mg Oral Daily   LORazepam  0-4 mg Intravenous Q12H   multivitamin with minerals  1 tablet Oral Daily   pantoprazole  40 mg Oral QAC breakfast   thiamine  100 mg Oral Daily   Or   thiamine  100 mg Intravenous Daily   Continuous Infusions:  0.9 % NaCl with KCl 20 mEq / L 100 mL/hr at 10/03/18 1104    Procedures/Studies: Ct Angio Chest/abd/pel For Dissection W And/or Wo Contrast  Result Date: 09/30/2018 CLINICAL DATA:  Left flank pain.  History of pancreatitis. EXAM: CT ANGIOGRAPHY CHEST, ABDOMEN AND PELVIS TECHNIQUE: Multidetector CT imaging through the chest, abdomen and pelvis was performed using the standard protocol during bolus administration of intravenous contrast. Multiplanar reconstructed images and MIPs were obtained and reviewed to evaluate the vascular anatomy. CONTRAST:  100 mL  OMNIPAQUE IOHEXOL 350 MG/ML SOLN COMPARISON:  CT abdomen pelvis 04/12/2018 FINDINGS: CTA CHEST FINDINGS Cardiovascular: --Heart: The heart size is normal. There is nopericardial effusion. There are coronary artery calcifications. --Aorta: The course and caliber of the thoracic aorta are normal. There is mild aortic atherosclerotic calcification. Precontrast images show no aortic intramural hematoma. There is no blood pool, dissection or penetrating ulcer demonstrated on arterial phase postcontrast imaging. There is a conventional 3 vessel aortic arch branching pattern. The proximal arch vessels are widely patent. --Pulmonary Arteries: Contrast timing is optimized for preferential opacification of the aorta. Within that limitation, normal central pulmonary arteries. Mediastinum/Nodes: No mediastinal, hilar or axillary lymphadenopathy. The visualized thyroid and thoracic esophageal course are unremarkable. Lungs/Pleura: There is biapical bullous emphysema. No pleural effusion or  pneumothorax. No focal airspace consolidation or pulmonary edema. Musculoskeletal: No chest wall abnormality. No acute osseous findings. Review of the MIP images confirms the above findings. CTA ABDOMEN AND PELVIS FINDINGS VASCULAR Aorta: Normal caliber aorta without aneurysm, dissection, vasculitis or hemodynamically significant stenosis. There is moderate aortic atherosclerosis. Celiac: No aneurysm, dissection or hemodynamically significant stenosis. Normal branching pattern. SMA: Widely patent without dissection or stenosis. Renals: Paired right and single left renal arteries. No aneurysm, dissection, stenosis or evidence of fibromuscular dysplasia. IMA: Origin is occluded but the distal arteries patent. Inflow: Moderate atherosclerotic calcification. No aneurysm, dissection or high-grade stenosis. Status post left femoral endarterectomy. Veins: Incidentally noted retro iliac left renal vein Review of the MIP images confirms the above findings. NON-VASCULAR Hepatobiliary: Normal hepatic contours and density. No visible biliary dilatation. Status post cholecystectomy. Pancreas: There is moderate stranding surrounding the pancreatic body and tail. There is no walled-off fluid collection. No evidence of pancreatic necrosis or hemorrhage. Spleen: Multiple calcified granulomata Adrenals/Urinary Tract: --Adrenal glands: Normal. --Right kidney/ureter: No hydronephrosis or perinephric stranding. No nephrolithiasis. No obstructing ureteral stones. --Left kidney/ureter: No hydronephrosis or perinephric stranding. No nephrolithiasis. No obstructing ureteral stones. --Urinary bladder: Unremarkable. Stomach/Bowel: --Stomach/Duodenum: No hiatal hernia or other gastric abnormality. Normal duodenal course and caliber. --Small bowel: No dilatation or inflammation. --Colon: No focal abnormality. --Appendix: Normal. Lymphatic:  No abdominal or pelvic lymphadenopathy. Reproductive: There are prosthetic calcifications.  Musculoskeletal. No bony spinal canal stenosis or focal osseous abnormality. Other: None. Review of the MIP images confirms the above findings. IMPRESSION: 1. No acute aortic syndrome.  Aortic atherosclerosis (ICD10-I70.0). 2. Acute pancreatitis without pseudocyst or defined fluid collection. 3.  Emphysema (ICD10-J43.9). Electronically Signed   By: Ulyses Jarred M.D.   On: 09/30/2018 20:18   US Abdomen Limited Ruq  Result Date: 10/01/2018 CLINICAL DATA:  Patient with history of acute pancreatitis. Prior cholecystectomy. EXAM: ULTRASOUND ABDOMEN LIMITED RIGHT UPPER QUADRANT COMPARISON:  CT abdomen pelvis 04/12/2018 FINDINGS: Gallbladder: Surgically absent Common bile duct: Diameter: 2 mm Liver: No focal lesion identified. Within normal limits in parenchymal echogenicity. Portal vein is patent on color Doppler imaging with normal direction of blood flow towards the liver. IMPRESSION: Gallbladder is surgically absent. Common bile duct is normal in diameter. Electronically Signed   By: Lovey Newcomer M.D.   On: 10/01/2018 12:51    Orson Eva, DO  [facility] Pager [phone]  If 7PM-7AM, please contact night-coverage [email or web address] Password [email or web address] 10/03/2018, 4:13 PM   LOS: 3 days

## 2018-10-04 LAB — BASIC METABOLIC PANEL
Anion gap: 8 (ref 5–15)
BUN: <5 mg/dL — ABNORMAL LOW (ref 6–20)
CO2: 25 mmol/L (ref 22–32)
Calcium: 8.8 mg/dL — ABNORMAL LOW (ref 8.9–10.3)
Chloride: 98 mmol/L (ref 98–111)
Creatinine, Ser: 0.54 mg/dL — ABNORMAL LOW (ref 0.61–1.24)
GFR calc Af Amer: >60 mL/min (ref >60)
GFR calc non Af Amer: >60 mL/min (ref >60)
Glucose, Bld: 117 mg/dL — ABNORMAL HIGH (ref 70–99)
Potassium: 3.9 mmol/L (ref 3.5–5.1)
Sodium: 131 mmol/L — ABNORMAL LOW (ref 135–145)

## 2018-10-04 LAB — MAGNESIUM: Magnesium: 1.8 mg/dL (ref 1.7–2.4)

## 2018-10-04 NOTE — Plan of Care (Signed)
Discharge home today

## 2018-10-04 NOTE — Progress Notes (Signed)
Nsg Discharge Note  Admit Date:  09/30/2018 Discharge date: 10/04/2018   Brady Bell to be D/C'd Home per MD order.  AVS completed.  Copy for chart, and copy for patient signed, and dated. Patient/caregiver able to verbalize understanding.  Discharge Medication: Allergies as of 10/04/2018   No Known Allergies     Medication List    TAKE these medications   aspirin EC 81 MG tablet Take 81 mg by mouth daily.   cycloSPORINE 0.05 % ophthalmic emulsion Commonly known as: RESTASIS Place 1 drop into both eyes 2 (two) times daily.   diltiazem 180 MG 24 hr capsule Commonly known as: CARDIZEM CD Take 1 capsule (180 mg total) by mouth daily.   divalproex 500 MG 24 hr tablet Commonly known as: DEPAKOTE ER Take one tab at bedtime.  Please call [phone] to schedule yearly follow up.   eletriptan 40 MG tablet Commonly known as: RELPAX Take 40 mg by mouth every 2 (two) hours as needed for migraine or headache. May repeat in 2 hours if headache persists or recurs.   fluticasone 50 MCG/ACT nasal spray Commonly known as: FLONASE Place 2 sprays into both nostrils daily.   gabapentin 300 MG capsule Commonly known as: NEURONTIN Take 300 mg by mouth daily.   HYDROcodone-acetaminophen 5-325 MG tablet Commonly known as: NORCO/VICODIN Take 1 tablet by mouth every 6 (six) hours as needed for moderate pain.   multivitamin with minerals Tabs tablet Take 1 tablet by mouth daily.   nicotine 21 mg/24hr patch Commonly known as: NICODERM CQ - dosed in mg/24 hours Place 1 patch (21 mg total) onto the skin daily.   pantoprazole 40 MG tablet Commonly known as: PROTONIX Take 1 tablet (40 mg total) by mouth daily before breakfast.   SUMAtriptan 100 MG tablet Commonly known as: IMITREX Take 1 tablet (100 mg total) by mouth every 2 (two) hours as needed for migraine. May repeat in 2 hours if headache persists or recurs.   thiamine 100 MG tablet Take 1 tablet (100 mg total) by mouth daily.    traZODone 50 MG tablet Commonly known as: DESYREL Take 50 mg by mouth at bedtime.       Discharge Assessment: Vitals:   10/04/18 0620 10/04/18 1311  BP: 127/77 (!) 142/94  Pulse: 63 70  Resp: 16 16  Temp: 98 F (36.7 C) 98 F (36.7 C)  SpO2: 97% 95%   Skin clean, dry and intact without evidence of skin break down, no evidence of skin tears noted. IV catheter discontinued intact. Site without signs and symptoms of complications - no redness or edema noted at insertion site, patient denies c/o pain - only slight tenderness at site.  Dressing with slight pressure applied.  D/c Instructions-Education: Discharge instructions given to patient/family with verbalized understanding. D/c education completed with patient/family including follow up instructions, medication list, d/c activities limitations if indicated, with other d/c instructions as indicated by MD - patient able to verbalize understanding, all questions fully answered. Patient instructed to return to ED, call 911, or call MD for any changes in condition.  Patient escorted via [place], and D/C home via private auto.  Dorcas Mcmurray, LPN 5/0/0938 1:82 PM

## 2018-12-15 ENCOUNTER — Emergency Department (HOSPITAL_COMMUNITY)
Admission: EM | Admit: 2018-12-15 | Discharge: 2018-12-15 | Disposition: A | Discharge status: Home / Self Care | Payer: Medicaid Other | Source: Home / Self Care | Attending: Emergency Medicine | Admitting: Emergency Medicine

## 2018-12-15 ENCOUNTER — Other Ambulatory Visit: Payer: Self-pay

## 2018-12-15 ENCOUNTER — Encounter (HOSPITAL_COMMUNITY): Payer: Self-pay

## 2018-12-15 DIAGNOSIS — F17210 Nicotine dependence, cigarettes, uncomplicated: Secondary | ICD-10-CM | POA: Insufficient documentation

## 2018-12-15 DIAGNOSIS — Z79899 Other long term (current) drug therapy: Secondary | ICD-10-CM | POA: Insufficient documentation

## 2018-12-15 DIAGNOSIS — J449 Chronic obstructive pulmonary disease, unspecified: Secondary | ICD-10-CM | POA: Insufficient documentation

## 2018-12-15 DIAGNOSIS — K852 Alcohol induced acute pancreatitis without necrosis or infection: Secondary | ICD-10-CM | POA: Insufficient documentation

## 2018-12-15 DIAGNOSIS — Z7982 Long term (current) use of aspirin: Secondary | ICD-10-CM | POA: Insufficient documentation

## 2018-12-15 LAB — COMPREHENSIVE METABOLIC PANEL
ALT: 16 U/L (ref 0–44)
AST: 20 U/L (ref 15–41)
Albumin: 3.9 g/dL (ref 3.5–5.0)
Alkaline Phosphatase: 114 U/L (ref 38–126)
Anion gap: 9 (ref 5–15)
BUN: 5 mg/dL — ABNORMAL LOW (ref 8–23)
CO2: 27 mmol/L (ref 22–32)
Calcium: 9 mg/dL (ref 8.9–10.3)
Chloride: 98 mmol/L (ref 98–111)
Creatinine, Ser: 0.77 mg/dL (ref 0.61–1.24)
GFR calc Af Amer: >60 mL/min (ref >60)
GFR calc non Af Amer: >60 mL/min (ref >60)
Glucose, Bld: 108 mg/dL — ABNORMAL HIGH (ref 70–99)
Potassium: 3.8 mmol/L (ref 3.5–5.1)
Sodium: 134 mmol/L — ABNORMAL LOW (ref 135–145)
Total Bilirubin: 0.8 mg/dL (ref 0.3–1.2)
Total Protein: 7.6 g/dL (ref 6.5–8.1)

## 2018-12-15 LAB — CBC WITH DIFFERENTIAL/PLATELET
Abs Immature Granulocytes: 0.04 10*3/uL (ref 0.00–0.07)
Basophils Absolute: 0.1 10*3/uL (ref 0.0–0.1)
Basophils Relative: 1 %
Eosinophils Absolute: 0.2 10*3/uL (ref 0.0–0.5)
Eosinophils Relative: 1 %
HCT: 47.9 % (ref 39.0–52.0)
Hemoglobin: 16.3 g/dL (ref 13.0–17.0)
Immature Granulocytes: 0 %
Lymphocytes Relative: 10 %
Lymphs Abs: 1.3 10*3/uL (ref 0.7–4.0)
MCH: 32.4 pg (ref 26.0–34.0)
MCHC: 34 g/dL (ref 30.0–36.0)
MCV: 95.2 fL (ref 80.0–100.0)
Monocytes Absolute: 1.2 10*3/uL — ABNORMAL HIGH (ref 0.1–1.0)
Monocytes Relative: 9 %
Neutro Abs: 10.5 10*3/uL — ABNORMAL HIGH (ref 1.7–7.7)
Neutrophils Relative %: 79 %
Platelets: 250 10*3/uL (ref 150–400)
RBC: 5.03 MIL/uL (ref 4.22–5.81)
RDW: 12.4 % (ref 11.5–15.5)
WBC: 13.3 10*3/uL — ABNORMAL HIGH (ref 4.0–10.5)
nRBC: 0 % (ref 0.0–0.2)

## 2018-12-15 LAB — URINALYSIS, ROUTINE W REFLEX MICROSCOPIC
Bilirubin Urine: NEGATIVE
Glucose, UA: NEGATIVE mg/dL
Hgb urine dipstick: NEGATIVE
Ketones, ur: NEGATIVE mg/dL
Leukocytes,Ua: NEGATIVE
Nitrite: NEGATIVE
Protein, ur: NEGATIVE mg/dL
Specific Gravity, Urine: 1.003 — ABNORMAL LOW (ref 1.005–1.030)
pH: 9 — ABNORMAL HIGH (ref 5.0–8.0)

## 2018-12-15 LAB — LIPASE, BLOOD: Lipase: 250 U/L — ABNORMAL HIGH (ref 11–51)

## 2018-12-15 MED ORDER — HYDROMORPHONE HCL 1 MG/ML IJ SOLN
1.0000 mg | Freq: Once | INTRAMUSCULAR | Status: AC
  Administered 2018-12-15: 16:00:00 1 mg via INTRAVENOUS
  Filled 2018-12-15: qty 1

## 2018-12-15 MED ORDER — HYDROMORPHONE HCL 1 MG/ML IJ SOLN
1.0000 mg | Freq: Once | INTRAMUSCULAR | Status: AC
  Administered 2018-12-15: 13:00:00 1 mg via INTRAVENOUS
  Filled 2018-12-15: qty 1

## 2018-12-15 MED ORDER — ONDANSETRON HCL 4 MG/2ML IJ SOLN
4.0000 mg | Freq: Once | INTRAMUSCULAR | Status: AC
  Administered 2018-12-15: 13:00:00 4 mg via INTRAVENOUS
  Filled 2018-12-15: qty 2

## 2018-12-15 MED ORDER — OXYCODONE-ACETAMINOPHEN 5-325 MG PO TABS: 1.0000 | ORAL_TABLET | Freq: Once | ORAL | Status: DC

## 2018-12-15 MED ORDER — OXYCODONE-ACETAMINOPHEN 5-325 MG PO TABS: 1.0000 | ORAL_TABLET | ORAL | 0 refills | Status: DC | PRN

## 2018-12-15 MED ORDER — SODIUM CHLORIDE 0.9 % IV BOLUS
1000.0000 mL | Freq: Once | INTRAVENOUS | Status: AC
  Administered 2018-12-15: 13:00:00 1000 mL via INTRAVENOUS

## 2018-12-15 MED ORDER — MORPHINE SULFATE (PF) 4 MG/ML IV SOLN
4.0000 mg | Freq: Once | INTRAVENOUS | Status: AC
  Administered 2018-12-15: 4 mg via INTRAVENOUS
  Filled 2018-12-15: qty 1

## 2018-12-15 MED ORDER — ONDANSETRON 4 MG PO TBDP: 4.0000 mg | ORAL_TABLET | Freq: Three times a day (TID) | ORAL | 0 refills | Status: DC | PRN

## 2018-12-15 NOTE — ED Provider Notes (Signed)
[facility] EMERGENCY DEPARTMENT Provider Note   CSN: [id number] Arrival date & time: 12/15/18  1138     History   Chief Complaint Chief Complaint  Patient presents with  . Abdominal Pain    HPI Brady Bell is a 63 y.o. male with a history significant for COPD, GERD, h/o hepatitis A, PVD, seizures, chronic etoh induced pancreatitis but states never had pancreatitis until he had gallstones with resultant cholecystectomy, complicated by pancreatitis June 2019.  He endorses having etoh last night with dinner after which he developed left upper abdominal pain radiating into his back. Pain is worsened this am along with nausea and one episode of vomiting this am when he tried to drink coffee.  He denies fevers chills, diarrhea, last bm yesterday and normal.  His symptoms are similar to prior episodes of pancreatitis.  Pain is constant, has had no medications, nor has he found no alleviators.     The history is provided by the patient.    Past Medical History:  Diagnosis Date  . Chronic ankle pain   . Chronic knee pain   . COPD (chronic obstructive pulmonary disease) ([place]) 09/30/2018   on CT chest  . GERD (gastroesophageal reflux disease)   . Hepatitis    Hep A in 1980's  . History of atrial flutter    Documented April/May 2019 in the setting of pancreatitis  . Hypoglycemia   . Migraines   . Pancreatitis   . Peripheral vascular disease ([place])   . Recurrent sinus infections   . Seizures ([place])   . Shingles     Patient Active Problem List   Diagnosis Date Noted  . Alcohol withdrawal ([place]) 10/02/2018  . Pancreatitis, acute 04/12/2018  . Tobacco abuse 04/12/2018  . GERD (gastroesophageal reflux disease) 04/12/2018  . Alcohol abuse 04/12/2018  . Hx of seizure disorder 04/12/2018  . Hx of atrial flutter 04/12/2018  . HTN (hypertension) 04/12/2018  . Gallstone pancreatitis   . Erythrocytosis 09/10/2017  . Calculus of gallbladder with cholecystitis without biliary obstruction    . Atrial flutter ([place]) 07/29/2017  . Acute gallstone pancreatitis 07/22/2017  . Acute alcoholic pancreatitis 32/95/1884  . Hypertensive urgency 07/22/2017  . Acute pancreatitis 07/22/2017  . Accelerated hypertension   . Tobacco abuse counseling   . PAD (peripheral artery disease) ([place]) 04/28/2017  . Chronic migraine 02/11/2017  . New daily persistent headache 12/08/2016  . Peripheral vascular disease ([place]) 12/08/2016  . Pain in joint, lower leg 02/10/2014  . Numbness and tingling of left leg 12/16/2013  . Cramps of left lower extremity-Leg 12/16/2013  . Aftercare following surgery of the circulatory system, [place] 12/16/2013  . PVD (peripheral vascular disease) with claudication ([place]) 05/07/2012  . Pain in limb 03/26/2012  . Pain, lower leg 12/26/2011  . Atherosclerosis of native arteries of extremity with intermittent claudication ([place]) 09/26/2011  . CLOSED FRACTURE OF UPPER END OF TIBIA 02/27/2010  . TEAR MEDIAL MENISCUS 02/27/2010    Past Surgical History:  Procedure Laterality Date  . ABDOMINAL AORTOGRAM N/A 04/23/2017   Procedure: ABDOMINAL AORTOGRAM;  Surgeon: Conrad Franklintown, MD;  Location: [place] CV LAB;  Service: Cardiovascular;  Laterality: N/A;  . CHOLECYSTECTOMY N/A 09/23/2017   Procedure: LAPAROSCOPIC CHOLECYSTECTOMY;  Surgeon: Aviva Signs, MD;  Location: [facility] ORS;  Service: General;  Laterality: N/A;  . ENDARTERECTOMY FEMORAL Left 04/28/2017   Procedure: ENDARTERECTOMY LEFT ILIO-FEMORAL ARTERY;  Surgeon: Conrad Industry, MD;  Location: [place];  Service: Vascular;  Laterality: Left;  .  FASCIOTOMY  09/06/2011   Procedure: FASCIOTOMY;  Surgeon: Fransisco Hertz, MD;  Location: [facility] OR;  Service: Vascular;  Laterality: Left;  . FEMORAL-TIBIAL BYPASS GRAFT  09/06/2011   Procedure: BYPASS GRAFT FEMORAL-TIBIAL ARTERY;  Surgeon: Fransisco Hertz, MD;  Location: [facility] OR;  Service: Vascular;  Laterality: Left;  . KNEE CARTILAGE SURGERY Left   . LOWER EXTREMITY ANGIOGRAM N/A 09/05/2011    Procedure: LOWER EXTREMITY ANGIOGRAM;  Surgeon: Sherren Kerns, MD;  Location: [facility] CATH LAB;  Service: Cardiovascular;  Laterality: N/A;  . LOWER EXTREMITY ANGIOGRAM Left 04/15/2012   Procedure: LOWER EXTREMITY ANGIOGRAM;  Surgeon: Fransisco Hertz, MD;  Location: [facility] CATH LAB;  Service: Cardiovascular;  Laterality: Left;  . LOWER EXTREMITY ANGIOGRAPHY Bilateral 04/23/2017   Procedure: Lower Extremity Angiography;  Surgeon: Fransisco Hertz, MD;  Location: [facility] INVASIVE CV LAB;  Service: Cardiovascular;  Laterality: Bilateral;  . PATCH ANGIOPLASTY Left 04/28/2017   Procedure: ILIO-FEMORAL ARTERY PATCH ANGIOPLASTY USING Livia Snellen BIOLOGIC PATCH;  Surgeon: Fransisco Hertz, MD;  Location: [facility] OR;  Service: Vascular;  Laterality: Left;  . PR VEIN BYPASS GRAFT,AORTO-FEM-POP  09/06/11   Left   . TOOTH EXTRACTION  June-July-Aug. 2015   Several         Home Medications    Prior to Admission medications   Medication Sig Start Date End Date Taking? Authorizing Provider  aspirin EC 81 MG tablet Take 81 mg by mouth daily.   Yes [provider]  cycloSPORINE (RESTASIS) 0.05 % ophthalmic emulsion Place 1 drop into both eyes 2 (two) times daily.   Yes [provider]  diltiazem (CARDIZEM CD) 180 MG 24 hr capsule Take 1 capsule (180 mg total) by mouth daily. 04/17/18  Yes Kari Baars, MD  divalproex (DEPAKOTE ER) 500 MG 24 hr tablet Take one tab at bedtime.  Please call [phone] to schedule yearly follow up. 02/22/18  Yes Levert Feinstein, MD  eletriptan (RELPAX) 40 MG tablet Take 40 mg by mouth every 2 (two) hours as needed for migraine or headache. May repeat in 2 hours if headache persists or recurs.    Yes [provider]  fluticasone (FLONASE) 50 MCG/ACT nasal spray Place 2 sprays into both nostrils daily.   Yes [provider]  gabapentin (NEURONTIN) 300 MG capsule Take 300 mg by mouth daily.    Yes [provider]  Multiple Vitamin (MULTIVITAMIN WITH MINERALS) TABS tablet  Take 1 tablet by mouth daily.   Yes [provider]  pantoprazole (PROTONIX) 40 MG tablet Take 1 tablet (40 mg total) by mouth daily before breakfast. 08/03/17  Yes Kari Baars, MD  SUMAtriptan (IMITREX) 100 MG tablet Take 1 tablet (100 mg total) by mouth every 2 (two) hours as needed for migraine. May repeat in 2 hours if headache persists or recurs. 12/08/16  Yes Levert Feinstein, MD  thiamine 100 MG tablet Take 1 tablet (100 mg total) by mouth daily. 04/17/18  Yes Kari Baars, MD  traZODone (DESYREL) 50 MG tablet Take 50 mg by mouth at bedtime.   Yes [provider]  ondansetron (ZOFRAN ODT) 4 MG disintegrating tablet Take 1 tablet (4 mg total) by mouth every 8 (eight) hours as needed for nausea or vomiting. 12/15/18   Dariya Gainer, Raynelle Fanning, PA-C  oxyCODONE-acetaminophen (PERCOCET/ROXICET) 5-325 MG tablet Take 1 tablet by mouth every 4 (four) hours as needed. 12/15/18   Burgess Amor, PA-C    Family History Family History  Problem Relation Age of Onset  . Alcohol abuse Father   .  Kidney failure Father   . Other Mother        "age"  . Diabetes Other     Social History Social History   Tobacco Use  . Smoking status: Current Every Day Smoker    Packs/day: 0.50    Years: 30.00    Pack years: 15.00    Types: Cigarettes  . Smokeless tobacco: Never Used  Substance Use Topics  . Alcohol use: Yes    Alcohol/week: 3.0 standard drinks    Types: 3 Standard drinks or equivalent per week    Comment: Few drinks per day  . Drug use: Yes    Types: Marijuana    Comment: occasionally     Allergies   Patient has no known allergies.   Review of Systems Review of Systems  Constitutional: Negative for chills and fever.  HENT: Negative for congestion and sore throat.   Eyes: Negative.   Respiratory: Negative for chest tightness and shortness of breath.   Cardiovascular: Negative for chest pain.  Gastrointestinal: Positive for abdominal pain, nausea and vomiting. Negative for  constipation and diarrhea.  Genitourinary: Negative.   Musculoskeletal: Negative for arthralgias, joint swelling and neck pain.  Skin: Negative.  Negative for rash and wound.  Neurological: Negative for dizziness, weakness, light-headedness, numbness and headaches.  Psychiatric/Behavioral: Negative.      Physical Exam Updated Vital Signs BP (!) 151/98 (BP Location: Right Arm)   Pulse 78   Temp 97.8 F (36.6 C) (Oral)   Resp 18   Ht 5\' 10"  (1.778 m)   Wt 63.5 kg   SpO2 94%   BMI 20.09 kg/m   Physical Exam Vitals signs and nursing note reviewed.  Constitutional:      Appearance: He is well-developed.  HENT:     Head: Normocephalic and atraumatic.  Eyes:     Conjunctiva/sclera: Conjunctivae normal.  Neck:     Musculoskeletal: Normal range of motion.  Cardiovascular:     Rate and Rhythm: Normal rate and regular rhythm.     Heart sounds: Normal heart sounds.  Pulmonary:     Effort: Pulmonary effort is normal.     Breath sounds: Normal breath sounds. No wheezing.  Abdominal:     General: Bowel sounds are normal.     Palpations: Abdomen is soft.     Tenderness: There is abdominal tenderness in the epigastric area and left upper quadrant. There is guarding. There is no rebound.     Hernia: No hernia is present.  Musculoskeletal: Normal range of motion.  Skin:    General: Skin is warm and dry.  Neurological:     Mental Status: He is alert.      ED Treatments / Results  Labs (all labs ordered are listed, but only abnormal results are displayed) Labs Reviewed  CBC WITH DIFFERENTIAL/PLATELET - Abnormal; Notable for the following components:      Result Value   WBC 13.3 (*)    Neutro Abs 10.5 (*)    Monocytes Absolute 1.2 (*)    All other components within normal limits  COMPREHENSIVE METABOLIC PANEL - Abnormal; Notable for the following components:   Sodium 134 (*)    Glucose, Bld 108 (*)    BUN 5 (*)    All other components within normal limits  LIPASE, BLOOD -  Abnormal; Notable for the following components:   Lipase 250 (*)    All other components within normal limits  URINALYSIS, ROUTINE W REFLEX MICROSCOPIC - Abnormal; Notable for the following  components:   Color, Urine STRAW (*)    Specific Gravity, Urine 1.003 (*)    pH 9.0 (*)    All other components within normal limits    EKG None  Radiology No results found.  Procedures Procedures (including critical care time)  Medications Ordered in ED Medications  sodium chloride 0.9 % bolus 1,000 mL (0 mLs Intravenous Stopped 12/15/18 1454)  morphine 4 MG/ML injection 4 mg (4 mg Intravenous Given 12/15/18 1244)  ondansetron (ZOFRAN) injection 4 mg (4 mg Intravenous Given 12/15/18 1244)  HYDROmorphone (DILAUDID) injection 1 mg (1 mg Intravenous Given 12/15/18 1326)  HYDROmorphone (DILAUDID) injection 1 mg (1 mg Intravenous Given 12/15/18 1548)     Initial Impression / Assessment and Plan / ED Course  I have reviewed the triage vital signs and the nursing notes.  Pertinent labs & imaging results that were available during my care of the patient were reviewed by me and considered in my medical decision making (see chart for details).        Labs reviewed and discussed with pt.  Exam and labs consistent with acute on chronic pancreatitis.  He was given IV fluids, zofran and morphine. No vomiting here, nausea improved, still painful.  Given dilaudid 1 mg IV, pain now 8/10, but he appears comfortable.  Repeated dilaudid x 1.    Discussed admission vs tx sx at home.  Pt prefers home tx,  Discussed need to be npo x 24 hours except for water to maintain hydration.  Discussed strict return precautions and hence need for admission including increased pain, vomiting, fever production, weakness.    Percocet, zofran prescribed.   Final Clinical Impressions(s) / ED Diagnoses   Final diagnoses:  Alcohol-induced acute pancreatitis, unspecified complication status    ED Discharge Orders          Ordered    oxyCODONE-acetaminophen (PERCOCET/ROXICET) 5-325 MG tablet  Every 4 hours PRN     12/15/18 1525    ondansetron (ZOFRAN ODT) 4 MG disintegrating tablet  Every 8 hours PRN     12/15/18 1525           Burgess Amordol, Lamae Fosco, PA-C 12/15/18 1609    Long, Arlyss RepressJoshua G, MD 12/15/18 [id number]

## 2018-12-15 NOTE — Discharge Instructions (Addendum)
As discussed it is reasonable to try to heal your pancreas inflammation at home which is done by resting the pancreas.  This is an organ of digestion - resting it means to avoid food for at least the next 1-2 days, but make sure you are staying hydrated by drinking lots of water.  You may take the percocet prescribed for pain relief.  This will make you drowsy - do not drive within 4 hours of taking this medication.  You will need to return here with plans for admission if you develop worse pain, uncontrolled vomiting, fevers or weakness.    As your pain resolves, eat small amounts of bland food (applesauce, jello, broth).  If your pain returns, it was too early to add food.  Plan to see your doctor or get rechecked here if you are unable to start adding calories back after the next 48 hours.

## 2018-12-15 NOTE — ED Notes (Signed)
Pt was informed that we need a urine sample. Pt was given a urinal. 

## 2018-12-15 NOTE — ED Triage Notes (Addendum)
Ems reports pt started having abd pain after eating dinner and drinking alcohol.  Reports pain worse this morning.  Reports vomited once this am.   EMS reports cbg 149

## 2018-12-16 ENCOUNTER — Other Ambulatory Visit: Payer: Self-pay

## 2018-12-16 ENCOUNTER — Encounter (HOSPITAL_COMMUNITY): Payer: Self-pay | Admitting: Emergency Medicine

## 2018-12-16 ENCOUNTER — Inpatient Hospital Stay (HOSPITAL_COMMUNITY)
Admission: EM | Admit: 2018-12-16 | Discharge: 2018-12-17 | DRG: 439 | Disposition: A | Discharge status: Home / Self Care | Payer: Medicaid Other | Attending: Pulmonary Disease | Admitting: Pulmonary Disease

## 2018-12-16 DIAGNOSIS — I739 Peripheral vascular disease, unspecified: Secondary | ICD-10-CM | POA: Diagnosis present

## 2018-12-16 DIAGNOSIS — F1010 Alcohol abuse, uncomplicated: Secondary | ICD-10-CM | POA: Diagnosis present

## 2018-12-16 DIAGNOSIS — I4892 Unspecified atrial flutter: Secondary | ICD-10-CM | POA: Diagnosis present

## 2018-12-16 DIAGNOSIS — J449 Chronic obstructive pulmonary disease, unspecified: Secondary | ICD-10-CM | POA: Diagnosis present

## 2018-12-16 DIAGNOSIS — K08409 Partial loss of teeth, unspecified cause, unspecified class: Secondary | ICD-10-CM | POA: Diagnosis present

## 2018-12-16 DIAGNOSIS — F17210 Nicotine dependence, cigarettes, uncomplicated: Secondary | ICD-10-CM | POA: Diagnosis present

## 2018-12-16 DIAGNOSIS — K219 Gastro-esophageal reflux disease without esophagitis: Secondary | ICD-10-CM | POA: Diagnosis present

## 2018-12-16 DIAGNOSIS — Z9049 Acquired absence of other specified parts of digestive tract: Secondary | ICD-10-CM

## 2018-12-16 DIAGNOSIS — Z720 Tobacco use: Secondary | ICD-10-CM | POA: Diagnosis present

## 2018-12-16 DIAGNOSIS — D72829 Elevated white blood cell count, unspecified: Secondary | ICD-10-CM | POA: Diagnosis not present

## 2018-12-16 DIAGNOSIS — F1020 Alcohol dependence, uncomplicated: Secondary | ICD-10-CM | POA: Diagnosis present

## 2018-12-16 DIAGNOSIS — Z7951 Long term (current) use of inhaled steroids: Secondary | ICD-10-CM | POA: Diagnosis not present

## 2018-12-16 DIAGNOSIS — R1011 Right upper quadrant pain: Secondary | ICD-10-CM | POA: Diagnosis not present

## 2018-12-16 DIAGNOSIS — Z20828 Contact with and (suspected) exposure to other viral communicable diseases: Secondary | ICD-10-CM | POA: Diagnosis present

## 2018-12-16 DIAGNOSIS — Z8679 Personal history of other diseases of the circulatory system: Secondary | ICD-10-CM | POA: Diagnosis not present

## 2018-12-16 DIAGNOSIS — G43909 Migraine, unspecified, not intractable, without status migrainosus: Secondary | ICD-10-CM | POA: Diagnosis present

## 2018-12-16 DIAGNOSIS — G40909 Epilepsy, unspecified, not intractable, without status epilepticus: Secondary | ICD-10-CM | POA: Diagnosis present

## 2018-12-16 DIAGNOSIS — Z8669 Personal history of other diseases of the nervous system and sense organs: Secondary | ICD-10-CM | POA: Diagnosis not present

## 2018-12-16 DIAGNOSIS — Z79899 Other long term (current) drug therapy: Secondary | ICD-10-CM | POA: Diagnosis not present

## 2018-12-16 DIAGNOSIS — Z811 Family history of alcohol abuse and dependence: Secondary | ICD-10-CM

## 2018-12-16 DIAGNOSIS — K852 Alcohol induced acute pancreatitis without necrosis or infection: Secondary | ICD-10-CM | POA: Diagnosis present

## 2018-12-16 DIAGNOSIS — I10 Essential (primary) hypertension: Secondary | ICD-10-CM | POA: Diagnosis present

## 2018-12-16 DIAGNOSIS — K859 Acute pancreatitis without necrosis or infection, unspecified: Secondary | ICD-10-CM | POA: Diagnosis present

## 2018-12-16 DIAGNOSIS — Z7982 Long term (current) use of aspirin: Secondary | ICD-10-CM

## 2018-12-16 LAB — COMPREHENSIVE METABOLIC PANEL
ALT: 33 U/L (ref 0–44)
AST: 43 U/L — ABNORMAL HIGH (ref 15–41)
Albumin: 3.8 g/dL (ref 3.5–5.0)
Alkaline Phosphatase: 149 U/L — ABNORMAL HIGH (ref 38–126)
Anion gap: 13 (ref 5–15)
BUN: 9 mg/dL (ref 8–23)
CO2: 24 mmol/L (ref 22–32)
Calcium: 8.8 mg/dL — ABNORMAL LOW (ref 8.9–10.3)
Chloride: 94 mmol/L — ABNORMAL LOW (ref 98–111)
Creatinine, Ser: 0.73 mg/dL (ref 0.61–1.24)
GFR calc Af Amer: >60 mL/min (ref >60)
GFR calc non Af Amer: >60 mL/min (ref >60)
Glucose, Bld: 106 mg/dL — ABNORMAL HIGH (ref 70–99)
Potassium: 4.3 mmol/L (ref 3.5–5.1)
Sodium: 131 mmol/L — ABNORMAL LOW (ref 135–145)
Total Bilirubin: 0.8 mg/dL (ref 0.3–1.2)
Total Protein: 7.3 g/dL (ref 6.5–8.1)

## 2018-12-16 LAB — CBC WITH DIFFERENTIAL/PLATELET
Abs Immature Granulocytes: 0.05 10*3/uL (ref 0.00–0.07)
Basophils Absolute: 0.1 10*3/uL (ref 0.0–0.1)
Basophils Relative: 0 %
Eosinophils Absolute: 0.1 10*3/uL (ref 0.0–0.5)
Eosinophils Relative: 1 %
HCT: 47.7 % (ref 39.0–52.0)
Hemoglobin: 16.2 g/dL (ref 13.0–17.0)
Immature Granulocytes: 0 %
Lymphocytes Relative: 5 %
Lymphs Abs: 0.9 10*3/uL (ref 0.7–4.0)
MCH: 33 pg (ref 26.0–34.0)
MCHC: 34 g/dL (ref 30.0–36.0)
MCV: 97.1 fL (ref 80.0–100.0)
Monocytes Absolute: 1.2 10*3/uL — ABNORMAL HIGH (ref 0.1–1.0)
Monocytes Relative: 7 %
Neutro Abs: 14.1 10*3/uL — ABNORMAL HIGH (ref 1.7–7.7)
Neutrophils Relative %: 87 %
Platelets: 211 10*3/uL (ref 150–400)
RBC: 4.91 MIL/uL (ref 4.22–5.81)
RDW: 12.4 % (ref 11.5–15.5)
WBC: 16.4 10*3/uL — ABNORMAL HIGH (ref 4.0–10.5)
nRBC: 0 % (ref 0.0–0.2)

## 2018-12-16 LAB — LIPASE, BLOOD: Lipase: 159 U/L — ABNORMAL HIGH (ref 11–51)

## 2018-12-16 LAB — SARS CORONAVIRUS 2 (TAT 6-24 HRS): SARS Coronavirus 2: NEGATIVE

## 2018-12-16 MED ORDER — DIVALPROEX SODIUM ER 500 MG PO TB24
500.0000 mg | ORAL_TABLET | Freq: Every day | ORAL | Status: DC
  Administered 2018-12-16: 500 mg via ORAL
  Filled 2018-12-16: qty 1

## 2018-12-16 MED ORDER — TRAZODONE HCL 50 MG PO TABS
50.0000 mg | ORAL_TABLET | Freq: Every day | ORAL | Status: DC
  Administered 2018-12-16: 50 mg via ORAL
  Filled 2018-12-16: qty 1

## 2018-12-16 MED ORDER — IPRATROPIUM-ALBUTEROL 0.5-2.5 (3) MG/3ML IN SOLN: 3.0000 mL | RESPIRATORY_TRACT | Status: DC | PRN

## 2018-12-16 MED ORDER — HYDROMORPHONE HCL 1 MG/ML IJ SOLN
0.5000 mg | INTRAMUSCULAR | Status: DC | PRN
  Administered 2018-12-16 – 2018-12-17 (×4): 1 mg via INTRAVENOUS
  Filled 2018-12-16 (×4): qty 1

## 2018-12-16 MED ORDER — SODIUM CHLORIDE 0.9 % IV SOLN
INTRAVENOUS | Status: DC
  Administered 2018-12-16 – 2018-12-17 (×2): via INTRAVENOUS

## 2018-12-16 MED ORDER — THIAMINE HCL 100 MG PO TABS: 100.0000 mg | ORAL_TABLET | Freq: Every day | ORAL | Status: DC

## 2018-12-16 MED ORDER — FLUTICASONE PROPIONATE 50 MCG/ACT NA SUSP
2.0000 | Freq: Every day | NASAL | Status: DC
  Administered 2018-12-16 – 2018-12-17 (×2): 2 via NASAL
  Filled 2018-12-16: qty 16

## 2018-12-16 MED ORDER — NICOTINE 21 MG/24HR TD PT24
21.0000 mg | MEDICATED_PATCH | Freq: Every day | TRANSDERMAL | Status: DC
  Administered 2018-12-16: 23:00:00 21 mg via TRANSDERMAL
  Filled 2018-12-16: qty 1

## 2018-12-16 MED ORDER — THIAMINE HCL 100 MG/ML IJ SOLN
100.0000 mg | Freq: Every day | INTRAMUSCULAR | Status: DC
  Filled 2018-12-16: qty 2

## 2018-12-16 MED ORDER — ASPIRIN EC 81 MG PO TBEC
81.0000 mg | DELAYED_RELEASE_TABLET | Freq: Every day | ORAL | Status: DC
  Administered 2018-12-17: 81 mg via ORAL
  Filled 2018-12-16: qty 1

## 2018-12-16 MED ORDER — LORAZEPAM 2 MG/ML IJ SOLN: 0.0000 mg | Freq: Two times a day (BID) | INTRAMUSCULAR | Status: DC

## 2018-12-16 MED ORDER — GABAPENTIN 300 MG PO CAPS
300.0000 mg | ORAL_CAPSULE | Freq: Every day | ORAL | Status: DC
  Administered 2018-12-16 – 2018-12-17 (×2): 300 mg via ORAL
  Filled 2018-12-16 (×2): qty 1

## 2018-12-16 MED ORDER — DILTIAZEM HCL ER COATED BEADS 180 MG PO CP24
180.0000 mg | ORAL_CAPSULE | Freq: Every day | ORAL | Status: DC
  Administered 2018-12-16 – 2018-12-17 (×2): 180 mg via ORAL
  Filled 2018-12-16 (×2): qty 1

## 2018-12-16 MED ORDER — LORAZEPAM 1 MG PO TABS: 1.0000 mg | ORAL_TABLET | ORAL | Status: DC | PRN

## 2018-12-16 MED ORDER — ADULT MULTIVITAMIN W/MINERALS CH
1.0000 | ORAL_TABLET | Freq: Every day | ORAL | Status: DC
  Administered 2018-12-16 – 2018-12-17 (×2): 1 via ORAL
  Filled 2018-12-16 (×2): qty 1

## 2018-12-16 MED ORDER — METOPROLOL TARTRATE 5 MG/5ML IV SOLN
5.0000 mg | Freq: Four times a day (QID) | INTRAVENOUS | Status: DC
  Administered 2018-12-16 – 2018-12-17 (×4): 5 mg via INTRAVENOUS
  Filled 2018-12-16 (×3): qty 5

## 2018-12-16 MED ORDER — ONDANSETRON HCL 4 MG PO TABS: 4.0000 mg | ORAL_TABLET | Freq: Four times a day (QID) | ORAL | Status: DC | PRN

## 2018-12-16 MED ORDER — HEPARIN SODIUM (PORCINE) 5000 UNIT/ML IJ SOLN
5000.0000 [IU] | Freq: Three times a day (TID) | INTRAMUSCULAR | Status: DC
  Administered 2018-12-16 – 2018-12-17 (×3): 5000 [IU] via SUBCUTANEOUS
  Filled 2018-12-16 (×3): qty 1

## 2018-12-16 MED ORDER — VITAMIN B-1 100 MG PO TABS
100.0000 mg | ORAL_TABLET | Freq: Every day | ORAL | Status: DC
  Administered 2018-12-16 – 2018-12-17 (×2): 100 mg via ORAL
  Filled 2018-12-16 (×2): qty 1

## 2018-12-16 MED ORDER — PANTOPRAZOLE SODIUM 40 MG IV SOLR
40.0000 mg | INTRAVENOUS | Status: DC
  Administered 2018-12-16: 40 mg via INTRAVENOUS
  Filled 2018-12-16: qty 40

## 2018-12-16 MED ORDER — SODIUM CHLORIDE 0.9 % IV BOLUS
1000.0000 mL | Freq: Once | INTRAVENOUS | Status: AC
  Administered 2018-12-16: 1000 mL via INTRAVENOUS

## 2018-12-16 MED ORDER — HYDRALAZINE HCL 20 MG/ML IJ SOLN
10.0000 mg | INTRAMUSCULAR | Status: DC | PRN
  Administered 2018-12-16: 10 mg via INTRAVENOUS
  Filled 2018-12-16: qty 1

## 2018-12-16 MED ORDER — ONDANSETRON HCL 4 MG/2ML IJ SOLN: 4.0000 mg | Freq: Four times a day (QID) | INTRAMUSCULAR | Status: DC | PRN

## 2018-12-16 MED ORDER — LORAZEPAM 2 MG/ML IJ SOLN: 1.0000 mg | INTRAMUSCULAR | Status: DC | PRN

## 2018-12-16 MED ORDER — CYCLOSPORINE 0.05 % OP EMUL
1.0000 [drp] | Freq: Two times a day (BID) | OPHTHALMIC | Status: DC
  Administered 2018-12-16 – 2018-12-17 (×3): 1 [drp] via OPHTHALMIC
  Filled 2018-12-16 (×3): qty 30

## 2018-12-16 MED ORDER — LORAZEPAM 2 MG/ML IJ SOLN
0.0000 mg | Freq: Four times a day (QID) | INTRAMUSCULAR | Status: DC
  Administered 2018-12-16: 1 mg via INTRAVENOUS
  Administered 2018-12-16: 4 mg via INTRAVENOUS
  Filled 2018-12-16: qty 2
  Filled 2018-12-16: qty 1

## 2018-12-16 MED ORDER — HYDROMORPHONE HCL 1 MG/ML IJ SOLN
1.0000 mg | Freq: Once | INTRAMUSCULAR | Status: AC
  Administered 2018-12-16: 1 mg via INTRAVENOUS
  Filled 2018-12-16: qty 1

## 2018-12-16 MED ORDER — FOLIC ACID 1 MG PO TABS
1.0000 mg | ORAL_TABLET | Freq: Every day | ORAL | Status: DC
  Administered 2018-12-16 – 2018-12-17 (×2): 1 mg via ORAL
  Filled 2018-12-16 (×2): qty 1

## 2018-12-16 NOTE — H&P (Signed)
History and Physical  Brady Bell ZHY:[id number] DOB: 04/29/1955 DOA: 12/16/2018  Referring physician: Jodi MourningZavitz MD  PCP: Kari BaarsHawkins, Edward, MD   Chief Complaint: abdominal pain   HPI: Brady Bell is a 63 y.o. male with a long history of chronic alcoholism, alcohol induced pancreatitis, hepatitis A, PVD, COPD, GERD, history of gallstone pancreatitis status post cholecystectomy 09/17/2017 presented to the ED on 12/15/2018 with abdominal pain and malaise.  Left upper quadrant abdominal pain that radiated into the back after eating supper.  He also had drank a large amount of alcohol prior to arrival.  He was also having some nausea and an episode of emesis.  He was seen in the ED and treated with IV fluids and pain medication.  He was offered hospitalization but he declined and wanted to go home and manage this at home.  Unfortunately he return to ED today with symptoms of ongoing abdominal pain radiating into the back, nausea and emesis.  The patient's lipase was elevated at 250.  He had a mild leukocytosis with a white blood cell count of 13.  The patient had no other signs of infection.  The patient is being admitted for alcohol induced pancreatitis.  Review of Systems: All systems reviewed and apart from history of presenting illness, are negative.  Past Medical History:  Diagnosis Date  . Chronic ankle pain   . Chronic knee pain   . COPD (chronic obstructive pulmonary disease) (HCC) 09/30/2018   on CT chest  . GERD (gastroesophageal reflux disease)   . Hepatitis    Hep A in 1980's  . History of atrial flutter    Documented April/May 2019 in the setting of pancreatitis  . Hypoglycemia   . Migraines   . Pancreatitis   . Peripheral vascular disease (H[facility])   . Recurrent sinus infections   . Seizures ([facility])   . Shingles    Past Surgical History:  Procedure Laterality Date  . ABDOMINAL AORTOGRAM N/A 04/23/2017   Procedure: ABDOMINAL AORTOGRAM;  Surgeon: Fransisco Hertzhen, Brian L, MD;  [facility];  Service: Cardiovascular;  Laterality: N/A;  . CHOLECYSTECTOMY N/A 09/23/2017   Procedure: LAPAROSCOPIC CHOLECYSTECTOMY;  Surgeon: Franky MachoJenkins, Mark, MD;  Location: [facility];  Service: General;  Laterality: N/A;  . ENDARTERECTOMY FEMORAL Left 04/28/2017   Procedure: ENDARTERECTOMY LEFT ILIO-FEMORAL ARTERY;  Surgeon: Fransisco Hertzhen, Brian L, MD;  [facility];  Service: Vascular;  Laterality: Left;  . FASCIOTOMY  09/06/2011   Procedure: FASCIOTOMY;  Surgeon: Fransisco HertzBrian L Chen, MD;  [facility];  Service: Vascular;  Laterality: Left;  . FEMORAL-TIBIAL BYPASS GRAFT  09/06/2011   Procedure: BYPASS GRAFT FEMORAL-TIBIAL ARTERY;  Surgeon: Fransisco HertzBrian L Chen, MD;  [facility];  Service: Vascular;  Laterality: Left;  . KNEE CARTILAGE SURGERY Left   . LOWER EXTREMITY ANGIOGRAM N/A 09/05/2011   Procedure: LOWER EXTREMITY ANGIOGRAM;  Surgeon: Sherren Kernsharles E Fields, MD;  [facility];  Service: Cardiovascular;  Laterality: N/A;  . LOWER EXTREMITY ANGIOGRAM Left 04/15/2012   Procedure: LOWER EXTREMITY ANGIOGRAM;  Surgeon: Fransisco HertzBrian L Chen, MD;  [facility];  Service: Cardiovascular;  Laterality: Left;  . LOWER EXTREMITY ANGIOGRAPHY Bilateral 04/23/2017   Procedure: Lower Extremity Angiography;  Surgeon: Fransisco Hertzhen, Brian L, MD;  [facility];  Service: Cardiovascular;  Laterality: Bilateral;  . PATCH ANGIOPLASTY Left 04/28/2017   Procedure: ILIO-FEMORAL ARTERY PATCH ANGIOPLASTY USING Livia SnellenXENOSURE BIOLOGIC PATCH;  Surgeon: Fransisco Hertzhen, Brian L, MD;  [facility];  Service:  Vascular;  Laterality: Left;  . PR VEIN BYPASS GRAFT,AORTO-FEM-POP  09/06/11   Left   . TOOTH EXTRACTION  June-July-Aug. 2015   Several    Social History:  reports that he has been smoking cigarettes. He has a 15.00 pack-year smoking history. He has never used smokeless tobacco. He reports current alcohol use of about 3.0 standard drinks of alcohol per week. He reports current drug use. Drug: Marijuana.  No Known Allergies  Family  History  Problem Relation Age of Onset  . Alcohol abuse Father   . Kidney failure Father   . Other Mother        "age"  . Diabetes Other     Prior to Admission medications   Medication Sig Start Date End Date Taking? Authorizing Provider  aspirin EC 81 MG tablet Take 81 mg by mouth daily.    [provider]  cycloSPORINE (RESTASIS) 0.05 % ophthalmic emulsion Place 1 drop into both eyes 2 (two) times daily.    [provider]  diltiazem (CARDIZEM CD) 180 MG 24 hr capsule Take 1 capsule (180 mg total) by mouth daily. 04/17/18   Kari Baars, MD  divalproex (DEPAKOTE ER) 500 MG 24 hr tablet Take one tab at bedtime.  Please call [phone] to schedule yearly follow up. 02/22/18   Levert Feinstein, MD  eletriptan (RELPAX) 40 MG tablet Take 40 mg by mouth every 2 (two) hours as needed for migraine or headache. May repeat in 2 hours if headache persists or recurs.     [provider]  fluticasone (FLONASE) 50 MCG/ACT nasal spray Place 2 sprays into both nostrils daily.    [provider]  gabapentin (NEURONTIN) 300 MG capsule Take 300 mg by mouth daily.     [provider]  Multiple Vitamin (MULTIVITAMIN WITH MINERALS) TABS tablet Take 1 tablet by mouth daily.    [provider]  ondansetron (ZOFRAN ODT) 4 MG disintegrating tablet Take 1 tablet (4 mg total) by mouth every 8 (eight) hours as needed for nausea or vomiting. 12/15/18   Idol, Raynelle Fanning, PA-C  oxyCODONE-acetaminophen (PERCOCET/ROXICET) 5-325 MG tablet Take 1 tablet by mouth every 4 (four) hours as needed. 12/15/18   Idol, Raynelle Fanning, PA-C  pantoprazole (PROTONIX) 40 MG tablet Take 1 tablet (40 mg total) by mouth daily before breakfast. 08/03/17   Kari Baars, MD  SUMAtriptan (IMITREX) 100 MG tablet Take 1 tablet (100 mg total) by mouth every 2 (two) hours as needed for migraine. May repeat in 2 hours if headache persists or recurs. 12/08/16   Levert Feinstein, MD  thiamine 100 MG tablet Take 1 tablet (100 mg  total) by mouth daily. 04/17/18   Kari Baars, MD  traZODone (DESYREL) 50 MG tablet Take 50 mg by mouth at bedtime.    [provider]   Physical Exam: Vitals:   12/16/18 0848 12/16/18 0900 12/16/18 0930  BP: (!) 170/116 (!) 171/112 (!) 187/114  Pulse: 97 95 93  Resp: 18    Temp: 98 F (36.7 C)    SpO2: 100% 98% 99%    General exam: Moderately built and nourished patient, lying comfortably supine on the gurney in no obvious distress.  Head, eyes and ENT: Nontraumatic and normocephalic. Pupils equally reacting to light and accommodation. Oral mucosa moist.  Neck: Supple. No JVD, carotid bruit or thyromegaly.  Lymphatics: No lymphadenopathy.  Respiratory system: Clear to auscultation. No increased work of breathing.  Cardiovascular system: S1 and S2 heard, RRR. No JVD, murmurs, gallops, clicks  or pedal edema.  Gastrointestinal system: Abdomen is nondistended, soft and diffuse epigastric tenderness to palpation. Normal bowel sounds heard. No organomegaly or masses appreciated.  Central nervous system: Alert and oriented. No focal neurological deficits.  Extremities: Symmetric 5 x 5 power. Peripheral pulses symmetrically felt.   Skin: No rashes or acute findings.  Musculoskeletal system: Negative exam.  Psychiatry: Pleasant and cooperative.  Labs on Admission:  Basic Metabolic Panel: Recent Labs  Lab 12/15/18 1316  NA 134*  K 3.8  CL 98  CO2 27  GLUCOSE 108*  BUN 5*  CREATININE 0.77  CALCIUM 9.0   Liver Function Tests: Recent Labs  Lab 12/15/18 1316  AST 20  ALT 16  ALKPHOS 114  BILITOT 0.8  PROT 7.6  ALBUMIN 3.9   Recent Labs  Lab 12/15/18 1316  LIPASE 250*   No results for input(s): AMMONIA in the last 168 hours. CBC: Recent Labs  Lab 12/15/18 1316  WBC 13.3*  NEUTROABS 10.5*  HGB 16.3  HCT 47.9  MCV 95.2  PLT 250   Cardiac Enzymes: No results for input(s): CKTOTAL, CKMB, CKMBINDEX, TROPONINI in the last 168 hours.  BNP  (last 3 results) No results for input(s): PROBNP in the last 8760 hours. CBG: No results for input(s): GLUCAP in the last 168 hours.  Radiological Exams on Admission: No results found.  Assessment/Plan Active Problems:   Acute pancreatitis   Tobacco abuse   GERD (gastroesophageal reflux disease)   Hx of seizure disorder   History of atrial flutter   HTN (hypertension)   Chronic alcohol abuse   COPD (chronic obstructive pulmonary disease) (HCC)   Leukocytosis  1. Acute pancreatitis -suspect this is alcohol induced as he has had a cholecystectomy June 2019.  Admit for IV fluids, IV pain medication IV nausea medication and supportive therapy.  Keep n.p.o. now.  Sips with medications.  Ice chips okay.  Monitor electrolytes closely and lipase trend. 2. Chronic alcoholism- very high likelihood of alcohol withdrawal.  Have started him on the CIWA protocol. 3. GERD-IV Protonix ordered for GI protection. 4. Essential hypertension-blood pressures currently uncontrolled likely due to uncontrolled pain.  IV hydralazine ordered, resume home diltiazem. 5. History of atrial flutter-he is taking diltiazem daily. 6. Tobacco- nicotine patch ordered for cravings. 7. History of epilepsy-resume home antiepileptic medication. 8. COPD-currently stable- bronchodilators ordered as needed. 9. Leukocytosis-suspect reactive secondary to acute pancreatitis, no signs or symptoms of infection at this time.  Continue to monitor daily CBC with differential. 10.   DVT Prophylaxis: Subcu heparin Code Status: Full Family Communication: Patient updated at bedside Disposition Plan: Inpatient  Time spent: 61 minutes  Rorie Delmore Wynetta Emery, MD [facility] How to contact the [facility] Attending or Consulting provider [place] or covering provider during after hours [place], for this patient?  1. Check the care team in [facility] and look for a) attending/consulting [facility] provider listed and b) the [facility] team listed 2. Log into  [email or web address] and use [place]'s universal password to access. If you do not have the password, please contact the hospital operator. 3. Locate the [facility] provider you are looking for under [facility] and page to a number that you can be directly reached. 4. If you still have difficulty reaching the provider, please page the [place] (Director on Call) for the Hospitalists listed on amion for assistance.

## 2018-12-16 NOTE — ED Provider Notes (Signed)
[facility] Provider Note   CSN: [id number] Arrival date & time: 12/16/18  0841     History   Chief Complaint Chief Complaint  Patient presents with  . Abdominal Pain    HPI Brady FabryKenneth L Salata is a 63 y.o. male.     Patient with history of gallstone pancreatitis, gallbladder removed, alcohol use, cigarette use, alcohol withdrawal history presents with worsening abdominal pain, vomiting and decreased appetite.  Patient was seen yesterday diagnosed with pancreatitis and trial of home therapy with Zofran pain meds and oral fluids.  Patient unfortunately not able to tolerate the pain and keep fluids down throughout the evening.  Patient denies fevers or chills.  This feels similar to his pancreatitis in the past.  Patient regularly uses alcohol.     Past Medical History:  Diagnosis Date  . Chronic ankle pain   . Chronic knee pain   . COPD (chronic obstructive pulmonary disease) (HCC) 09/30/2018   on CT chest  . GERD (gastroesophageal reflux disease)   . Hepatitis    Hep A in 1980's  . History of atrial flutter    Documented April/May 2019 in the setting of pancreatitis  . Hypoglycemia   . Migraines   . Pancreatitis   . Peripheral vascular disease (HCC)   . Recurrent sinus infections   . Seizures (HCC)   . Shingles     Patient Active Problem List   Diagnosis Date Noted  . Chronic alcohol abuse 12/16/2018  . Leukocytosis 12/16/2018  . Alcohol withdrawal (HCC) 10/02/2018  . COPD (chronic obstructive pulmonary disease) (HCC) 09/30/2018  . Pancreatitis, acute 04/12/2018  . Tobacco abuse 04/12/2018  . GERD (gastroesophageal reflux disease) 04/12/2018  . Alcohol abuse 04/12/2018  . Hx of seizure disorder 04/12/2018  . History of atrial flutter 04/12/2018  . HTN (hypertension) 04/12/2018  . Gallstone pancreatitis   . Erythrocytosis 09/10/2017  . Calculus of gallbladder with cholecystitis without biliary obstruction   . Atrial flutter (HCC) 07/29/2017   . Acute gallstone pancreatitis 07/22/2017  . Acute alcoholic pancreatitis 07/22/2017  . Hypertensive urgency 07/22/2017  . Acute pancreatitis 07/22/2017  . Accelerated hypertension   . Tobacco abuse counseling   . PAD (peripheral artery disease) (HCC) 04/28/2017  . Chronic migraine 02/11/2017  . New daily persistent headache 12/08/2016  . Peripheral vascular disease (HCC) 12/08/2016  . Pain in joint, lower leg 02/10/2014  . Numbness and tingling of left leg 12/16/2013  . Cramps of left lower extremity-Leg 12/16/2013  . Aftercare following surgery of the circulatory system, NEC 12/16/2013  . PVD (peripheral vascular disease) with claudication (HCC) 05/07/2012  . Pain in limb 03/26/2012  . Pain, lower leg 12/26/2011  . Atherosclerosis of native arteries of extremity with intermittent claudication (HCC) 09/26/2011  . CLOSED FRACTURE OF UPPER END OF TIBIA 02/27/2010  . TEAR MEDIAL MENISCUS 02/27/2010    Past Surgical History:  Procedure Laterality Date  . ABDOMINAL AORTOGRAM N/A 04/23/2017   Procedure: ABDOMINAL AORTOGRAM;  Surgeon: Fransisco Hertzhen, Brian L, MD;  [facility];  Service: Cardiovascular;  Laterality: N/A;  . CHOLECYSTECTOMY N/A 09/23/2017   Procedure: LAPAROSCOPIC CHOLECYSTECTOMY;  Surgeon: Franky MachoJenkins, Mark, MD;  Location: [facility];  Service: General;  Laterality: N/A;  . ENDARTERECTOMY FEMORAL Left 04/28/2017   Procedure: ENDARTERECTOMY LEFT ILIO-FEMORAL ARTERY;  Surgeon: Fransisco Hertzhen, Brian L, MD;  [facility];  Service: Vascular;  Laterality: Left;  . FASCIOTOMY  09/06/2011   Procedure: FASCIOTOMY;  Surgeon: Fransisco HertzBrian L Chen, MD;  [facility];  Service: Vascular;  Laterality: Left;  . FEMORAL-TIBIAL BYPASS GRAFT  09/06/2011   Procedure: BYPASS GRAFT FEMORAL-TIBIAL ARTERY;  Surgeon: Conrad Granville, MD;  Location: [place];  Service: Vascular;  Laterality: Left;  . KNEE CARTILAGE SURGERY Left   . LOWER EXTREMITY ANGIOGRAM N/A 09/05/2011   Procedure: LOWER EXTREMITY ANGIOGRAM;  Surgeon:  Elam Dutch, MD;  Location: [facility] CATH LAB;  Service: Cardiovascular;  Laterality: N/A;  . LOWER EXTREMITY ANGIOGRAM Left 04/15/2012   Procedure: LOWER EXTREMITY ANGIOGRAM;  Surgeon: Conrad Amagansett, MD;  Location: [facility] CATH LAB;  Service: Cardiovascular;  Laterality: Left;  . LOWER EXTREMITY ANGIOGRAPHY Bilateral 04/23/2017   Procedure: Lower Extremity Angiography;  Surgeon: Conrad , MD;  Location: [place] CV LAB;  Service: Cardiovascular;  Laterality: Bilateral;  . PATCH ANGIOPLASTY Left 04/28/2017   Procedure: ILIO-FEMORAL ARTERY PATCH ANGIOPLASTY USING Rueben Bash BIOLOGIC PATCH;  Surgeon: Conrad , MD;  Location: [place];  Service: Vascular;  Laterality: Left;  . PR VEIN BYPASS GRAFT,AORTO-FEM-POP  09/06/11   Left   . TOOTH EXTRACTION  June-July-Aug. 2015   Several         Home Medications    Prior to Admission medications   Medication Sig Start Date End Date Taking? Authorizing Provider  aspirin EC 81 MG tablet Take 81 mg by mouth daily.   Yes [provider]  cycloSPORINE (RESTASIS) 0.05 % ophthalmic emulsion Place 1 drop into both eyes 2 (two) times daily.   Yes [provider]  diltiazem (CARDIZEM CD) 180 MG 24 hr capsule Take 1 capsule (180 mg total) by mouth daily. 04/17/18  Yes Sinda Du, MD  divalproex (DEPAKOTE ER) 500 MG 24 hr tablet Take one tab at bedtime.  Please call [phone] to schedule yearly follow up. 02/22/18  Yes Marcial Pacas, MD  eletriptan (RELPAX) 40 MG tablet Take 40 mg by mouth every 2 (two) hours as needed for migraine or headache. May repeat in 2 hours if headache persists or recurs.    Yes [provider]  fluticasone (FLONASE) 50 MCG/ACT nasal spray Place 2 sprays into both nostrils daily.   Yes [provider]  gabapentin (NEURONTIN) 300 MG capsule Take 300 mg by mouth daily.    Yes [provider]  Multiple Vitamin (MULTIVITAMIN WITH MINERALS) TABS tablet Take 1 tablet by mouth daily.   Yes [provider]  ondansetron (ZOFRAN ODT) 4 MG disintegrating tablet Take 1 tablet (4 mg total) by mouth every 8 (eight) hours as needed for nausea or vomiting. 12/15/18  Yes Idol, Almyra Free, PA-C  oxyCODONE-acetaminophen (PERCOCET/ROXICET) 5-325 MG tablet Take 1 tablet by mouth every 4 (four) hours as needed. 12/15/18  Yes Idol, Almyra Free, PA-C  pantoprazole (PROTONIX) 40 MG tablet Take 1 tablet (40 mg total) by mouth daily before breakfast. 08/03/17  Yes Sinda Du, MD  SUMAtriptan (IMITREX) 100 MG tablet Take 1 tablet (100 mg total) by mouth every 2 (two) hours as needed for migraine. May repeat in 2 hours if headache persists or recurs. 12/08/16  Yes Marcial Pacas, MD  thiamine 100 MG tablet Take 1 tablet (100 mg total) by mouth daily. 04/17/18  Yes Sinda Du, MD  traZODone (DESYREL) 50 MG tablet Take 50 mg by mouth at bedtime.   Yes [provider]    Family History Family History  Problem Relation Age of Onset  . Alcohol abuse Father   . Kidney failure Father   . Other Mother        "  age"  . Diabetes Other     Social History Social History   Tobacco Use  . Smoking status: Current Every Day Smoker    Packs/day: 0.50    Years: 30.00    Pack years: 15.00    Types: Cigarettes  . Smokeless tobacco: Never Used  Substance Use Topics  . Alcohol use: Yes    Alcohol/week: 3.0 standard drinks    Types: 3 Standard drinks or equivalent per week    Comment: Few drinks per day  . Drug use: Yes    Types: Marijuana    Comment: occasionally     Allergies   Patient has no known allergies.   Review of Systems Review of Systems  Constitutional: Negative for chills and fever.  HENT: Negative for congestion.   Eyes: Negative for visual disturbance.  Respiratory: Negative for shortness of breath.   Cardiovascular: Negative for chest pain.  Gastrointestinal: Positive for abdominal pain, nausea and vomiting.  Genitourinary: Negative for dysuria and flank pain.  Musculoskeletal:  Negative for back pain, neck pain and neck stiffness.  Skin: Negative for rash.  Neurological: Negative for light-headedness and headaches.     Physical Exam Updated Vital Signs BP (!) 187/114   Pulse 93   Temp 98 F (36.7 C)   Resp 18   SpO2 99%   Physical Exam Vitals signs and nursing note reviewed.  Constitutional:      Appearance: He is well-developed.  HENT:     Head: Normocephalic and atraumatic.     Comments: Dry mm Eyes:     General:        Right eye: No discharge.        Left eye: No discharge.     Conjunctiva/sclera: Conjunctivae normal.  Neck:     Musculoskeletal: Normal range of motion and neck supple.     Trachea: No tracheal deviation.  Cardiovascular:     Rate and Rhythm: Normal rate and regular rhythm.  Pulmonary:     Effort: Pulmonary effort is normal.     Breath sounds: Normal breath sounds.  Abdominal:     General: There is no distension.     Palpations: Abdomen is soft.     Tenderness: There is generalized abdominal tenderness. There is no guarding.  Skin:    General: Skin is warm.     Findings: No rash.  Neurological:     Mental Status: He is alert and oriented to person, place, and time.      ED Treatments / Results  Labs (all labs ordered are listed, but only abnormal results are displayed) Labs Reviewed  SARS CORONAVIRUS 2 (TAT 6-24 HRS)  CBC WITH DIFFERENTIAL/PLATELET  COMPREHENSIVE METABOLIC PANEL  LIPASE, BLOOD    EKG None  Radiology No results found.  Procedures Procedures (including critical care time)  Medications Ordered in ED Medications  HYDROmorphone (DILAUDID) injection 1 mg (1 mg Intravenous Given 12/16/18 0933)  sodium chloride 0.9 % bolus 1,000 mL (1,000 mLs Intravenous New Bag/Given 12/16/18 0933)     Initial Impression / Assessment and Plan / ED Course  I have reviewed the triage vital signs and the nursing notes.  Pertinent labs & imaging results that were available during my care of the patient were  reviewed by me and considered in my medical decision making (see chart for details).       Patient presents with clinical concern for acute on chronic pancreatitis likely alcohol induced.  Despite home therapy patient has worsening signs and symptoms.  Discussed plan for admission at this time for IV fluids, pain control and further monitoring. Repeat blood work pending. Discussed with hospitalist for admission for acute pancreatitis. The patients results and plan were reviewed and discussed.   Any x-rays performed were independently reviewed by myself.   Differential diagnosis were considered with the presenting HPI.  Medications  HYDROmorphone (DILAUDID) injection 1 mg (1 mg Intravenous Given 12/16/18 0933)  sodium chloride 0.9 % bolus 1,000 mL (1,000 mLs Intravenous New Bag/Given 12/16/18 0933)    Vitals:   12/16/18 0848 12/16/18 0900 12/16/18 0930  BP: (!) 170/116 (!) 171/112 (!) 187/114  Pulse: 97 95 93  Resp: 18    Temp: 98 F (36.7 C)    SpO2: 100% 98% 99%    Final diagnoses:  Alcohol-induced acute pancreatitis, unspecified complication status  Acute bilateral upper abdominal pain    Admission/ observation were discussed with the admitting physician, patient and/or family and they are comfortable with the plan.    Final Clinical Impressions(s) / ED Diagnoses   Final diagnoses:  Alcohol-induced acute pancreatitis, unspecified complication status  Acute bilateral upper abdominal pain    ED Discharge Orders    None       Blane OharaZavitz, Joscelin Fray, MD 12/16/18 1037

## 2018-12-16 NOTE — ED Triage Notes (Signed)
Pt c/o of abdominal pain. HX of pancreatitis. Was seen yesterday for same but states pain is still present.

## 2018-12-17 LAB — CBC WITH DIFFERENTIAL/PLATELET
Abs Immature Granulocytes: 0.05 10*3/uL (ref 0.00–0.07)
Basophils Absolute: 0 10*3/uL (ref 0.0–0.1)
Basophils Relative: 0 %
Eosinophils Absolute: 0 10*3/uL (ref 0.0–0.5)
Eosinophils Relative: 0 %
HCT: 41 % (ref 39.0–52.0)
Hemoglobin: 13.6 g/dL (ref 13.0–17.0)
Immature Granulocytes: 0 %
Lymphocytes Relative: 10 %
Lymphs Abs: 1.3 10*3/uL (ref 0.7–4.0)
MCH: 32.5 pg (ref 26.0–34.0)
MCHC: 33.2 g/dL (ref 30.0–36.0)
MCV: 98.1 fL (ref 80.0–100.0)
Monocytes Absolute: 1.4 10*3/uL — ABNORMAL HIGH (ref 0.1–1.0)
Monocytes Relative: 11 %
Neutro Abs: 10.5 10*3/uL — ABNORMAL HIGH (ref 1.7–7.7)
Neutrophils Relative %: 79 %
Platelets: 187 10*3/uL (ref 150–400)
RBC: 4.18 MIL/uL — ABNORMAL LOW (ref 4.22–5.81)
RDW: 12.5 % (ref 11.5–15.5)
WBC: 13.4 10*3/uL — ABNORMAL HIGH (ref 4.0–10.5)
nRBC: 0 % (ref 0.0–0.2)

## 2018-12-17 LAB — COMPREHENSIVE METABOLIC PANEL
ALT: 20 U/L (ref 0–44)
AST: 19 U/L (ref 15–41)
Albumin: 2.9 g/dL — ABNORMAL LOW (ref 3.5–5.0)
Alkaline Phosphatase: 109 U/L (ref 38–126)
Anion gap: 9 (ref 5–15)
BUN: 14 mg/dL (ref 8–23)
CO2: 25 mmol/L (ref 22–32)
Calcium: 8.4 mg/dL — ABNORMAL LOW (ref 8.9–10.3)
Chloride: 97 mmol/L — ABNORMAL LOW (ref 98–111)
Creatinine, Ser: 0.62 mg/dL (ref 0.61–1.24)
GFR calc Af Amer: >60 mL/min (ref >60)
GFR calc non Af Amer: >60 mL/min (ref >60)
Glucose, Bld: 87 mg/dL (ref 70–99)
Potassium: 4.5 mmol/L (ref 3.5–5.1)
Sodium: 131 mmol/L — ABNORMAL LOW (ref 135–145)
Total Bilirubin: 0.7 mg/dL (ref 0.3–1.2)
Total Protein: 5.9 g/dL — ABNORMAL LOW (ref 6.5–8.1)

## 2018-12-17 LAB — MAGNESIUM: Magnesium: 1.6 mg/dL — ABNORMAL LOW (ref 1.7–2.4)

## 2018-12-17 LAB — HEMOGLOBIN A1C
Hgb A1c MFr Bld: 5.5 % (ref 4.8–5.6)
Mean Plasma Glucose: 111 mg/dL

## 2018-12-17 LAB — LIPASE, BLOOD: Lipase: 56 U/L — ABNORMAL HIGH (ref 11–51)

## 2018-12-17 NOTE — TOC Progression Note (Signed)
Received CSW consult stating Brady Bell. Spoke with patient to discuss. Per pt, he had just informed MD at admission that he has personal care services through his Medicaid. Pt states that he has someone every day for a few hours. Per pt, he feels he has enough help and does not feel that he has any new TOC needs for dc.  Will sign off.

## 2018-12-17 NOTE — Discharge Summary (Signed)
Physician Discharge Summary  Patient ID: Brady Bell MRN: [id number] DOB/AGE: 61/09/1955 61 y.o. Primary Care Physician:Brady Bell, Brady DredgeEdward, MD Admit date: 12/16/2018 Discharge date: 12/17/2018    Discharge Diagnoses:   Active Problems:   Acute pancreatitis   Tobacco abuse   GERD (gastroesophageal reflux disease)   Hx of seizure disorder   History of atrial flutter   HTN (hypertension)   Chronic alcohol abuse   COPD (chronic obstructive pulmonary disease) (HCC)   Leukocytosis Peripheral arterial disease  Allergies as of 12/17/2018   No Known Allergies     Medication List    TAKE these medications   aspirin EC 81 MG tablet Take 81 mg by mouth daily.   cycloSPORINE 0.05 % ophthalmic emulsion Commonly known as: RESTASIS Place 1 drop into both eyes 2 (two) times daily.   diltiazem 180 MG 24 hr capsule Commonly known as: CARDIZEM CD Take 1 capsule (180 mg total) by mouth daily.   divalproex 500 MG 24 hr tablet Commonly known as: DEPAKOTE ER Take one tab at bedtime.  Please call [phone] to schedule yearly follow up.   eletriptan 40 MG tablet Commonly known as: RELPAX Take 40 mg by mouth every 2 (two) hours as needed for migraine or headache. May repeat in 2 hours if headache persists or recurs.   fluticasone 50 MCG/ACT nasal spray Commonly known as: FLONASE Place 2 sprays into both nostrils daily.   gabapentin 300 MG capsule Commonly known as: NEURONTIN Take 300 mg by mouth daily.   multivitamin with minerals Tabs tablet Take 1 tablet by mouth daily.   ondansetron 4 MG disintegrating tablet Commonly known as: Zofran ODT Take 1 tablet (4 mg total) by mouth every 8 (eight) hours as needed for nausea or vomiting.   oxyCODONE-acetaminophen 5-325 MG tablet Commonly known as: PERCOCET/ROXICET Take 1 tablet by mouth every 4 (four) hours as needed.   pantoprazole 40 MG tablet Commonly known as: PROTONIX Take 1 tablet (40 mg total) by mouth daily before breakfast.   SUMAtriptan 100 MG tablet Commonly known as: IMITREX Take 1 tablet (100 mg total) by mouth every 2 (two) hours as needed for migraine. May repeat in 2 hours if headache persists or recurs.   thiamine 100 MG tablet Take 1 tablet (100 mg total) by mouth daily.   traZODone 50 MG tablet Commonly known as: DESYREL Take 50 mg by mouth at bedtime.       Discharged Condition: Improved    Consults: None  Significant Diagnostic Studies: No results found.  Lab Results: Basic Metabolic Panel: Recent Labs    12/16/18 1126 12/17/18 0521  NA 131* 131*  K 4.3 4.5  CL 94* 97*  CO2 24 25  GLUCOSE 106* 87  BUN 9 14  CREATININE 0.73 0.62  CALCIUM 8.8* 8.4*  MG  --  1.6*   Liver Function Tests: Recent Labs    12/16/18 1126 12/17/18 0521  AST 43* 19  ALT 33 20  ALKPHOS 149* 109  BILITOT 0.8 0.7  PROT 7.3 5.9*  ALBUMIN 3.8 2.9*     CBC: Recent Labs    12/16/18 1126 12/17/18 0521  WBC 16.4* 13.4*  NEUTROABS 14.1* 10.5*  HGB 16.2 13.6  HCT 47.7 41.0  MCV 97.1 98.1  PLT 211 187    Recent Results (from the past 240 hour(s))  SARS CORONAVIRUS 2 (TAT 6-24 HRS) Nasopharyngeal Nasopharyngeal Swab     Status: None   Collection Time: 12/16/18  9:58 AM   Specimen: Nasopharyngeal Swab  Result Value Ref Range Status   SARS Coronavirus 2 NEGATIVE NEGATIVE Final    Comment: (NOTE) SARS-CoV-2 target nucleic acids are NOT DETECTED. The SARS-CoV-2 RNA is generally detectable in upper and lower respiratory specimens during the acute phase of infection. Negative results do not preclude SARS-CoV-2 infection, do not rule out co-infections with other pathogens, and should not be used as the sole basis for treatment or other patient management decisions. Negative results must be combined with clinical observations, patient history, and epidemiological information. The expected result is Negative. Fact Sheet for Patients: SugarRoll.be Fact Sheet for  Healthcare Providers: https://www.woods-mathews.com/ This test is not yet approved or cleared by the [place] FDA and  has been authorized for detection and/or diagnosis of SARS-CoV-2 by FDA under an Emergency Use Authorization (EUA). This EUA will remain  in effect (meaning this test can be used) for the duration of the COVID-19 declaration under Section 56 4(b)(1) of the Act, 21 U.S.C. section 360bbb-3(b)(1), unless the authorization is terminated or revoked sooner. Performed at [facility], [place] [address], [place], [place] [address]      Hospital Course: This is a 63 year old who came to the emergency department on the 16th with an episode of pancreatitis.  He was treated and able to go home but once he got home had more trouble and came back to the hospital and was admitted from the ER.  Because of his chronic alcohol abuse he was felt to be high risk for developing delirium tremens and he was started on protocol.  He was given IV fluids.  He was given pain medications.  Medications for nausea were administered.  He improved was able to tolerate food and was ready for discharge.  Still very high risk for readmission because of his ongoing alcohol abuse.  Discharge Exam: Blood pressure (!) 159/86, pulse 81, temperature 98.4 F (36.9 C), temperature source Oral, resp. rate 19, SpO2 95 %. He is awake and alert.  Chest is clear.  Heart is regular.  Abdomen soft.  Disposition: Home      Signed: Alonza Bogus   12/17/2018, 8:09 AM

## 2018-12-17 NOTE — Plan of Care (Signed)

## 2018-12-17 NOTE — Progress Notes (Signed)
Subjective: He says he feels much better.  He is not having any nausea or vomiting.  He has been n.p.o. however.  He wants to see if he can eat.  He is still having some abdominal pain  Objective: Vital signs in last 24 hours: Temp:  [98 F (36.7 C)-98.4 F (36.9 C)] 98.4 F (36.9 C) (09/18 0526) Pulse Rate:  [81-113] 81 (09/18 0526) Resp:  [16-20] 19 (09/17 1800) BP: (140-187)/(84-116) 159/86 (09/18 0526) SpO2:  [95 %-100 %] 95 % (09/18 0526) Weight change:  Last BM Date: 12/15/18  Intake/Output from previous day: 09/17 0701 - 09/18 0700 In: 2282.4 [I.V.:1282.4; IV Piggyback:1000] Out: -   PHYSICAL EXAM General appearance: alert, cooperative and no distress Resp: rhonchi bilaterally Cardio: regular rate and rhythm, S1, S2 normal, no murmur, click, rub or gallop GI: He still has some abdominal tenderness Extremities: extremities normal, atraumatic, no cyanosis or edema  Lab Results:  Results for orders placed or performed during the hospital encounter of 12/16/18 (from the past 48 hour(s))  SARS CORONAVIRUS 2 (TAT 6-24 HRS) Nasopharyngeal Nasopharyngeal Swab     Status: None   Collection Time: 12/16/18  9:58 AM   Specimen: Nasopharyngeal Swab  Result Value Ref Range   SARS Coronavirus 2 NEGATIVE NEGATIVE    Comment: (NOTE) SARS-CoV-2 target nucleic acids are NOT DETECTED. The SARS-CoV-2 RNA is generally detectable in upper and lower respiratory specimens during the acute phase of infection. Negative results do not preclude SARS-CoV-2 infection, do not rule out co-infections with other pathogens, and should not be used as the sole basis for treatment or other patient management decisions. Negative results must be combined with clinical observations, patient history, and epidemiological information. The expected result is Negative. Fact Sheet for Patients: HairSlick.nohttps://www.fda.gov/media/138098/download Fact Sheet for Healthcare  Providers: quierodirigir.comhttps://www.fda.gov/media/138095/download This test is not yet approved or cleared by the [place] FDA and  has been authorized for detection and/or diagnosis of SARS-CoV-2 by FDA under an Emergency Use Authorization (EUA). This EUA will remain  in effect (meaning this test can be used) for the duration of the COVID-19 declaration under Section 56 4(b)(1) of the Act, 21 U.S.C. section 360bbb-3(b)(1), unless the authorization is terminated or revoked sooner. Performed at [facility], [address] [place], [place] [address]   CBC with Differential     Status: Abnormal   Collection Time: 12/16/18 11:26 AM  Result Value Ref Range   WBC 16.4 (H) 4.0 - 10.5 K/uL   RBC 4.91 4.22 - 5.81 MIL/uL   Hemoglobin 16.2 13.0 - 17.0 g/dL   HCT 60.447.7 54.039.0 - 98.152.0 %   MCV 97.1 80.0 - 100.0 fL   MCH 33.0 26.0 - 34.0 pg   MCHC 34.0 30.0 - 36.0 g/dL   RDW 19.112.4 47.811.5 - 29.515.5 %   Platelets 211 150 - 400 K/uL   nRBC 0.0 0.0 - 0.2 %   Neutrophils Relative % 87 %   Neutro Abs 14.1 (H) 1.7 - 7.7 K/uL   Lymphocytes Relative 5 %   Lymphs Abs 0.9 0.7 - 4.0 K/uL   Monocytes Relative 7 %   Monocytes Absolute 1.2 (H) 0.1 - 1.0 K/uL   Eosinophils Relative 1 %   Eosinophils Absolute 0.1 0.0 - 0.5 K/uL   Basophils Relative 0 %   Basophils Absolute 0.1 0.0 - 0.1 K/uL   Immature Granulocytes 0 %   Abs Immature Granulocytes 0.05 0.00 - 0.07 K/uL    Comment: Performed at [facility],  [address] [place], [place] [address]  Comprehensive metabolic panel     Status: Abnormal   Collection Time: 12/16/18 11:26 AM  Result Value Ref Range   Sodium 131 (L) 135 - 145 mmol/L   Potassium 4.3 3.5 - 5.1 mmol/L   Chloride 94 (L) 98 - 111 mmol/L   CO2 24 22 - 32 mmol/L   Glucose, Bld 106 (H) 70 - 99 mg/dL   BUN 9 8 - 23 mg/dL   Creatinine, Ser 6.210.73 0.61 - 1.24 mg/dL   Calcium 8.8 (L) 8.9 - 10.3 mg/dL   Total Protein 7.3 6.5 - 8.1 g/dL   Albumin 3.8 3.5 - 5.0 g/dL   AST 43 (H) 15 - 41 U/L   ALT  33 0 - 44 U/L   Alkaline Phosphatase 149 (H) 38 - 126 U/L   Total Bilirubin 0.8 0.3 - 1.2 mg/dL   GFR calc non Af Amer >60 >60 mL/min   GFR calc Af Amer >60 >60 mL/min   Anion gap 13 5 - 15    Comment: Performed at [facility], [address] [place], [place] [address]  Lipase, blood     Status: Abnormal   Collection Time: 12/16/18 11:26 AM  Result Value Ref Range   Lipase 159 (H) 11 - 51 U/L    Comment: Performed at [facility], [address] [place], [place] [address]  Hemoglobin A1c     Status: None   Collection Time: 12/16/18 11:26 AM  Result Value Ref Range   Hgb A1c MFr Bld 5.5 4.8 - 5.6 %    Comment: (NOTE)         Prediabetes: 5.7 - 6.4         Diabetes: >6.4         Glycemic control for adults with diabetes: <7.0    Mean Plasma Glucose 111 mg/dL    Comment: (NOTE) Performed At: [facility] [address] [place], [place] [id number] Jolene SchimkeNagendra Sanjai MD KG:[phone]   Comprehensive metabolic panel     Status: Abnormal   Collection Time: 12/17/18  5:21 AM  Result Value Ref Range   Sodium 131 (L) 135 - 145 mmol/L   Potassium 4.5 3.5 - 5.1 mmol/L   Chloride 97 (L) 98 - 111 mmol/L   CO2 25 22 - 32 mmol/L   Glucose, Bld 87 70 - 99 mg/dL   BUN 14 8 - 23 mg/dL   Creatinine, Ser 6.440.62 0.61 - 1.24 mg/dL   Calcium 8.4 (L) 8.9 - 10.3 mg/dL   Total Protein 5.9 (L) 6.5 - 8.1 g/dL   Albumin 2.9 (L) 3.5 - 5.0 g/dL   AST 19 15 - 41 U/L   ALT 20 0 - 44 U/L   Alkaline Phosphatase 109 38 - 126 U/L   Total Bilirubin 0.7 0.3 - 1.2 mg/dL   GFR calc non Af Amer >60 >60 mL/min   GFR calc Af Amer >60 >60 mL/min   Anion gap 9 5 - 15    Comment: Performed at [facility], [address] [place], [place] [address]  Magnesium     Status: Abnormal   Collection Time: 12/17/18  5:21 AM  Result Value Ref Range   Magnesium 1.6 (L) 1.7 - 2.4 mg/dL    Comment: Performed at [facility], [address] [place], [place] [address]  CBC WITH DIFFERENTIAL     Status: Abnormal    Collection Time: 12/17/18  5:21 AM  Result Value Ref Range  WBC 13.4 (H) 4.0 - 10.5 K/uL   RBC 4.18 (L) 4.22 - 5.81 MIL/uL   Hemoglobin 13.6 13.0 - 17.0 g/dL   HCT 01.0 27.2 - 53.6 %   MCV 98.1 80.0 - 100.0 fL   MCH 32.5 26.0 - 34.0 pg   MCHC 33.2 30.0 - 36.0 g/dL   RDW 64.4 03.4 - 74.2 %   Platelets 187 150 - 400 K/uL   nRBC 0.0 0.0 - 0.2 %   Neutrophils Relative % 79 %   Neutro Abs 10.5 (H) 1.7 - 7.7 K/uL   Lymphocytes Relative 10 %   Lymphs Abs 1.3 0.7 - 4.0 K/uL   Monocytes Relative 11 %   Monocytes Absolute 1.4 (H) 0.1 - 1.0 K/uL   Eosinophils Relative 0 %   Eosinophils Absolute 0.0 0.0 - 0.5 K/uL   Basophils Relative 0 %   Basophils Absolute 0.0 0.0 - 0.1 K/uL   Immature Granulocytes 0 %   Abs Immature Granulocytes 0.05 0.00 - 0.07 K/uL    Comment: Performed at [facility], [address], [place], [place] [address]  Lipase, blood     Status: Abnormal   Collection Time: 12/17/18  5:21 AM  Result Value Ref Range   Lipase 56 (H) 11 - 51 U/L    Comment: Performed at [facility], [address], [place], [place] [address]    ABGS No results for input(s): PHART, PO2ART, TCO2, HCO3 in the last 72 hours.  Invalid input(s): PCO2 CULTURES Recent Results (from the past 240 hour(s))  SARS CORONAVIRUS 2 (TAT 6-24 HRS) Nasopharyngeal Nasopharyngeal Swab     Status: None   Collection Time: 12/16/18  9:58 AM   Specimen: Nasopharyngeal Swab  Result Value Ref Range Status   SARS Coronavirus 2 NEGATIVE NEGATIVE Final    Comment: (NOTE) SARS-CoV-2 target nucleic acids are NOT DETECTED. The SARS-CoV-2 RNA is generally detectable in upper and lower respiratory specimens during the acute phase of infection. Negative results do not preclude SARS-CoV-2 infection, do not rule out co-infections with other pathogens, and should not be used as the sole basis for treatment or other patient management decisions. Negative results must be combined with clinical observations, patient  history, and epidemiological information. The expected result is Negative. Fact Sheet for Patients: HairSlick.no Fact Sheet for Healthcare Providers: quierodirigir.com This test is not yet approved or cleared by the [place] FDA and  has been authorized for detection and/or diagnosis of SARS-CoV-2 by FDA under an Emergency Use Authorization (EUA). This EUA will remain  in effect (meaning this test can be used) for the duration of the COVID-19 declaration under Section 56 4(b)(1) of the Act, 21 U.S.C. section 360bbb-3(b)(1), unless the authorization is terminated or revoked sooner. Performed at [facility], [address] [place], [place] [address]    Studies/Results: No results found.  Medications:  Prior to Admission:  Medications Prior to Admission  Medication Sig Dispense Refill Last Dose  . aspirin EC 81 MG tablet Take 81 mg by mouth daily.   12/15/2018 at Unknown time  . cycloSPORINE (RESTASIS) 0.05 % ophthalmic emulsion Place 1 drop into both eyes 2 (two) times daily.   12/15/2018 at Unknown time  . diltiazem (CARDIZEM CD) 180 MG 24 hr capsule Take 1 capsule (180 mg total) by mouth daily. 30 capsule 12 12/15/2018 at Unknown time  . divalproex (DEPAKOTE ER) 500 MG 24 hr tablet Take one tab at bedtime.  Please call [phone] to schedule yearly follow up.  90 tablet 0 12/15/2018 at Unknown time  . eletriptan (RELPAX) 40 MG tablet Take 40 mg by mouth every 2 (two) hours as needed for migraine or headache. May repeat in 2 hours if headache persists or recurs.    12/15/2018 at Unknown time  . fluticasone (FLONASE) 50 MCG/ACT nasal spray Place 2 sprays into both nostrils daily.   12/15/2018 at Unknown time  . gabapentin (NEURONTIN) 300 MG capsule Take 300 mg by mouth daily.    12/15/2018 at Unknown time  . Multiple Vitamin (MULTIVITAMIN WITH MINERALS) TABS tablet Take 1 tablet by mouth daily.   12/15/2018 at Unknown time  .  ondansetron (ZOFRAN ODT) 4 MG disintegrating tablet Take 1 tablet (4 mg total) by mouth every 8 (eight) hours as needed for nausea or vomiting. 20 tablet 0 12/15/2018 at Unknown time  . oxyCODONE-acetaminophen (PERCOCET/ROXICET) 5-325 MG tablet Take 1 tablet by mouth every 4 (four) hours as needed. 20 tablet 0 12/16/2018 at Unknown time  . pantoprazole (PROTONIX) 40 MG tablet Take 1 tablet (40 mg total) by mouth daily before breakfast. 30 tablet 12 12/15/2018 at Unknown time  . SUMAtriptan (IMITREX) 100 MG tablet Take 1 tablet (100 mg total) by mouth every 2 (two) hours as needed for migraine. May repeat in 2 hours if headache persists or recurs. 12 tablet 11 12/15/2018 at Unknown time  . thiamine 100 MG tablet Take 1 tablet (100 mg total) by mouth daily.   12/15/2018 at Unknown time  . traZODone (DESYREL) 50 MG tablet Take 50 mg by mouth at bedtime.   12/15/2018 at Unknown time   Scheduled: . aspirin EC  81 mg Oral Daily  . cycloSPORINE  1 drop Both Eyes BID  . diltiazem  180 mg Oral Daily  . divalproex  500 mg Oral QHS  . fluticasone  2 spray Each Nare Daily  . folic acid  1 mg Oral Daily  . gabapentin  300 mg Oral Daily  . heparin  5,000 Units Subcutaneous Q8H  . LORazepam  0-4 mg Intravenous Q6H   Followed by  . [START ON 12/18/2018] LORazepam  0-4 mg Intravenous Q12H  . metoprolol tartrate  5 mg Intravenous Q6H  . multivitamin with minerals  1 tablet Oral Daily  . nicotine  21 mg Transdermal Daily  . pantoprazole (PROTONIX) IV  40 mg Intravenous Q24H  . thiamine  100 mg Oral Daily   Or  . thiamine  100 mg Intravenous Daily  . traZODone  50 mg Oral QHS   Continuous: . sodium chloride 140 mL/hr at 12/17/18 0102   NKN:[id number], HYDROmorphone (DILAUDID) injection, ipratropium-albuterol, LORazepam **OR** LORazepam, ondansetron **OR** ondansetron (ZOFRAN) IV  Assesment: He was admitted with another episode of acute pancreatitis presumably from alcohol abuse.  He is improving.  He has  reflux at baseline and that is being treated  He has chronic alcohol abuse and he is on alcohol withdrawal protocol  He has COPD and is still smoking cigarettes and he has PRN nebulizer treatment  He has seizure disorder but no seizures recently  He has peripheral arterial disease and has had procedure on his left leg Active Problems:   Acute pancreatitis   Tobacco abuse   GERD (gastroesophageal reflux disease)   Hx of seizure disorder   History of atrial flutter   HTN (hypertension)   Chronic alcohol abuse   COPD (chronic obstructive pulmonary disease) (HCC)   Leukocytosis    Plan: Start him on clear liquids.  Advance diet as  tolerated.  Some potential for discharge later today    LOS: 1 day   Fredirick Maudlin 12/17/2018, 8:04 AM

## 2019-03-08 ENCOUNTER — Other Ambulatory Visit: Payer: Self-pay

## 2019-03-08 ENCOUNTER — Inpatient Hospital Stay (HOSPITAL_COMMUNITY): Payer: Medicaid Other | Attending: Hematology

## 2019-03-08 DIAGNOSIS — J449 Chronic obstructive pulmonary disease, unspecified: Secondary | ICD-10-CM | POA: Insufficient documentation

## 2019-03-08 DIAGNOSIS — K219 Gastro-esophageal reflux disease without esophagitis: Secondary | ICD-10-CM | POA: Insufficient documentation

## 2019-03-08 DIAGNOSIS — F17210 Nicotine dependence, cigarettes, uncomplicated: Secondary | ICD-10-CM | POA: Insufficient documentation

## 2019-03-08 DIAGNOSIS — Z7982 Long term (current) use of aspirin: Secondary | ICD-10-CM | POA: Diagnosis not present

## 2019-03-08 DIAGNOSIS — Z79899 Other long term (current) drug therapy: Secondary | ICD-10-CM | POA: Diagnosis not present

## 2019-03-08 DIAGNOSIS — I739 Peripheral vascular disease, unspecified: Secondary | ICD-10-CM | POA: Insufficient documentation

## 2019-03-08 DIAGNOSIS — D751 Secondary polycythemia: Secondary | ICD-10-CM | POA: Diagnosis present

## 2019-03-08 LAB — CBC WITH DIFFERENTIAL/PLATELET
Abs Immature Granulocytes: 0.01 10*3/uL (ref 0.00–0.07)
Basophils Absolute: 0 10*3/uL (ref 0.0–0.1)
Basophils Relative: 1 %
Eosinophils Absolute: 0.2 10*3/uL (ref 0.0–0.5)
Eosinophils Relative: 3 %
HCT: 40.2 % (ref 39.0–52.0)
Hemoglobin: 13.1 g/dL (ref 13.0–17.0)
Immature Granulocytes: 0 %
Lymphocytes Relative: 18 %
Lymphs Abs: 1.1 10*3/uL (ref 0.7–4.0)
MCH: 30.9 pg (ref 26.0–34.0)
MCHC: 32.6 g/dL (ref 30.0–36.0)
MCV: 94.8 fL (ref 80.0–100.0)
Monocytes Absolute: 1 10*3/uL (ref 0.1–1.0)
Monocytes Relative: 15 %
Neutro Abs: 3.9 10*3/uL (ref 1.7–7.7)
Neutrophils Relative %: 63 %
Platelets: 222 10*3/uL (ref 150–400)
RBC: 4.24 MIL/uL (ref 4.22–5.81)
RDW: 12.3 % (ref 11.5–15.5)
WBC: 6.2 10*3/uL (ref 4.0–10.5)
nRBC: 0 % (ref 0.0–0.2)

## 2019-03-08 LAB — COMPREHENSIVE METABOLIC PANEL
ALT: 12 U/L (ref 0–44)
AST: 18 U/L (ref 15–41)
Albumin: 4.1 g/dL (ref 3.5–5.0)
Alkaline Phosphatase: 66 U/L (ref 38–126)
Anion gap: 11 (ref 5–15)
BUN: 11 mg/dL (ref 8–23)
CO2: 26 mmol/L (ref 22–32)
Calcium: 9.3 mg/dL (ref 8.9–10.3)
Chloride: 94 mmol/L — ABNORMAL LOW (ref 98–111)
Creatinine, Ser: 0.74 mg/dL (ref 0.61–1.24)
GFR calc Af Amer: >60 mL/min (ref >60)
GFR calc non Af Amer: >60 mL/min (ref >60)
Glucose, Bld: 99 mg/dL (ref 70–99)
Potassium: 4.6 mmol/L (ref 3.5–5.1)
Sodium: 131 mmol/L — ABNORMAL LOW (ref 135–145)
Total Bilirubin: 1 mg/dL (ref 0.3–1.2)
Total Protein: 7.2 g/dL (ref 6.5–8.1)

## 2019-03-15 ENCOUNTER — Other Ambulatory Visit: Payer: Self-pay

## 2019-03-15 ENCOUNTER — Inpatient Hospital Stay (HOSPITAL_BASED_OUTPATIENT_CLINIC_OR_DEPARTMENT_OTHER): Payer: Medicaid Other | Admitting: Hematology

## 2019-03-15 ENCOUNTER — Encounter (HOSPITAL_COMMUNITY): Payer: Self-pay | Admitting: Hematology

## 2019-03-15 DIAGNOSIS — D751 Secondary polycythemia: Secondary | ICD-10-CM | POA: Diagnosis not present

## 2019-03-15 NOTE — Patient Instructions (Signed)
[facility]  Discharge Instructions:  You saw Renee Nester, NP, today. _______________________________________________________________  Thank you for choosing [facility] at [facility] to provide your oncology and hematology care.  To afford each patient quality time with our providers, please arrive at least 15 minutes before your scheduled appointment.  You need to re-schedule your appointment if you arrive 10 or more minutes late.  We strive to give you quality time with our providers, and arriving late affects you and other patients whose appointments are after yours.  Also, if you no show three or more times for appointments you may be dismissed from the clinic.  Again, thank you for choosing [facility]. Our hope is that these requests will allow you access to exceptional care and in a timely manner. _______________________________________________________________  If you have questions after your visit, please contact our office at [phone] between the hours of 8:30 a.m. and 5:00 p.m. Voicemails left after 4:30 p.m. will not be returned until the following business day. _______________________________________________________________  For prescription refill requests, have your pharmacy contact our office. _______________________________________________________________  Recommendations made by the consultant and any test results will be sent to your referring physician. _______________________________________________________________ 

## 2019-03-18 NOTE — Progress Notes (Signed)
Brady Bell, [place] [address]   CLINIC:  Medical Oncology/Hematology  PCP:  Sinda Du, [place] [address] [phone]   REASON FOR VISIT:  Follow-up for erythrocytosis  CURRENT THERAPY: Medical surveillance    INTERVAL HISTORY:  Brady Bell 63 y.o. male presents today for follow-up.  Reports overall doing well.  He denies any significant changes to his health history since his last visit he states he is currently working on sustaining from alcohol and cigarettes.  He denies any significant fatigue.  Denies any change in bowel habits.  Appetite is stable.  No weight loss.  He is here for repeat labs and office visit.   REVIEW OF SYSTEMS:  Review of Systems  All other systems reviewed and are negative.    PAST MEDICAL/SURGICAL HISTORY:  Past Medical History:  Diagnosis Date  . Chronic ankle pain   . Chronic knee pain   . COPD (chronic obstructive pulmonary disease) ([facility]) 09/30/2018   on CT chest  . GERD (gastroesophageal reflux disease)   . Hepatitis    Hep A in 1980's  . History of atrial flutter    Documented April/May 2019 in the setting of pancreatitis  . Hypoglycemia   . Migraines   . Pancreatitis   . Peripheral vascular disease ([facility])   . Recurrent sinus infections   . Seizures ([facility])   . Shingles    Past Surgical History:  Procedure Laterality Date  . ABDOMINAL AORTOGRAM N/A 04/23/2017   Procedure: ABDOMINAL AORTOGRAM;  Surgeon: Conrad [place], MD;  Location: [facility];  Service: Cardiovascular;  Laterality: N/A;  . CHOLECYSTECTOMY N/A 09/23/2017   Procedure: LAPAROSCOPIC CHOLECYSTECTOMY;  Surgeon: Aviva Signs, MD;  Location: [facility];  Service: General;  Laterality: N/A;  . ENDARTERECTOMY FEMORAL Left 04/28/2017   Procedure: ENDARTERECTOMY LEFT ILIO-FEMORAL ARTERY;  Surgeon: Conrad [place], MD;  Location: [facility];  Service: Vascular;  Laterality: Left;  . FASCIOTOMY  09/06/2011   Procedure: FASCIOTOMY;  Surgeon: Conrad [place], MD;  Location: [facility];  Service: Vascular;  Laterality: Left;  . FEMORAL-TIBIAL BYPASS GRAFT  09/06/2011   Procedure: BYPASS GRAFT FEMORAL-TIBIAL ARTERY;  Surgeon: Conrad Leon, MD;  Location: [facility];  Service: Vascular;  Laterality: Left;  . KNEE CARTILAGE SURGERY Left   . LOWER EXTREMITY ANGIOGRAM N/A 09/05/2011   Procedure: LOWER EXTREMITY ANGIOGRAM;  Surgeon: Elam Dutch, MD;  Location: [facility] CATH LAB;  Service: Cardiovascular;  Laterality: N/A;  . LOWER EXTREMITY ANGIOGRAM Left 04/15/2012   Procedure: LOWER EXTREMITY ANGIOGRAM;  Surgeon: Conrad [place], MD;  Location: [facility] CATH LAB;  Service: Cardiovascular;  Laterality: Left;  . LOWER EXTREMITY ANGIOGRAPHY Bilateral 04/23/2017   Procedure: Lower Extremity Angiography;  Surgeon: Conrad Bicknell, MD;  Location: [facility];  Service: Cardiovascular;  Laterality: Bilateral;  . PATCH ANGIOPLASTY Left 04/28/2017   Procedure: ILIO-FEMORAL ARTERY PATCH ANGIOPLASTY USING Rueben Bash BIOLOGIC PATCH;  Surgeon: Conrad [place], MD;  Location: [facility];  Service: Vascular;  Laterality: Left;  . PR VEIN BYPASS GRAFT,AORTO-FEM-POP  09/06/11   Left   . TOOTH EXTRACTION  June-July-Aug. 2015   Several      SOCIAL HISTORY:  Social History   Socioeconomic History  . Marital status: Single    Spouse name: Not on file  . Number of children: 0  . Years of education: some college  . Highest education level: Not on file  Occupational History  . Occupation:  Disabled  Tobacco Use  . Smoking status: Current Every Day Smoker    Packs/day: 0.50    Years: 30.00    Pack years: 15.00    Types: Cigarettes  . Smokeless tobacco: Never Used  Substance and Sexual Activity  . Alcohol use: Yes    Alcohol/week: 3.0 standard drinks    Types: 3 Standard drinks or equivalent per week    Comment: Few drinks per day  . Drug use: Yes    Types: Marijuana    Comment: occasionally  . Sexual activity: Yes  Other Topics Concern  .  Not on file  Social History Narrative   Lives alone.   Left-handed.   2-3 cups caffeine per day.   Social Determinants of Health   Financial Resource Strain:   . Difficulty of Paying Living Expenses: Not on file  Food Insecurity:   . Worried About Charity fundraiser in the Last Year: Not on file  . Ran Out of Food in the Last Year: Not on file  Transportation Needs:   . Lack of Transportation (Medical): Not on file  . Lack of Transportation (Non-Medical): Not on file  Physical Activity:   . Days of Exercise per Week: Not on file  . Minutes of Exercise per Session: Not on file  Stress:   . Feeling of Stress : Not on file  Social Connections:   . Frequency of Communication with Friends and Family: Not on file  . Frequency of Social Gatherings with Friends and Family: Not on file  . Attends Religious Services: Not on file  . Active Member of Clubs or Organizations: Not on file  . Attends Archivist Meetings: Not on file  . Marital Status: Not on file  Intimate Partner Violence:   . Fear of Current or Ex-Partner: Not on file  . Emotionally Abused: Not on file  . Physically Abused: Not on file  . Sexually Abused: Not on file    FAMILY HISTORY:  Family History  Problem Relation Age of Onset  . Alcohol abuse Father   . Kidney failure Father   . Other Mother        "age"  . Diabetes Other     CURRENT MEDICATIONS:  Outpatient Encounter Medications as of 03/15/2019  Medication Sig  . aspirin EC 81 MG tablet Take 81 mg by mouth daily.  . cycloSPORINE (RESTASIS) 0.05 % ophthalmic emulsion Place 1 drop into both eyes 2 (two) times daily.  Marland Kitchen diltiazem (CARDIZEM CD) 180 MG 24 hr capsule Take 1 capsule (180 mg total) by mouth daily.  . divalproex (DEPAKOTE ER) 500 MG 24 hr tablet Take one tab at bedtime.  Please call [phone] to schedule yearly follow up.  . eletriptan (RELPAX) 40 MG tablet Take 40 mg by mouth every 2 (two) hours as needed for migraine or headache. May  repeat in 2 hours if headache persists or recurs.   . fluticasone (FLONASE) 50 MCG/ACT nasal spray Place 2 sprays into both nostrils daily.  Marland Kitchen gabapentin (NEURONTIN) 300 MG capsule Take 300 mg by mouth daily.   . Multiple Vitamin (MULTIVITAMIN WITH MINERALS) TABS tablet Take 1 tablet by mouth daily.  . ondansetron (ZOFRAN ODT) 4 MG disintegrating tablet Take 1 tablet (4 mg total) by mouth every 8 (eight) hours as needed for nausea or vomiting.  . pantoprazole (PROTONIX) 40 MG tablet Take 1 tablet (40 mg total) by mouth daily before breakfast.  . SUMAtriptan (IMITREX) 100 MG tablet Take 1  tablet (100 mg total) by mouth every 2 (two) hours as needed for migraine. May repeat in 2 hours if headache persists or recurs.  . thiamine 100 MG tablet Take 1 tablet (100 mg total) by mouth daily.  . traZODone (DESYREL) 50 MG tablet Take 50 mg by mouth at bedtime.  . [DISCONTINUED] oxyCODONE-acetaminophen (PERCOCET/ROXICET) 5-325 MG tablet Take 1 tablet by mouth every 4 (four) hours as needed.   No facility-administered encounter medications on file as of 03/15/2019.    ALLERGIES:  No Known Allergies   PHYSICAL EXAM:  ECOG Performance status: 1  Vitals:   03/15/19 1115  BP: 120/88  Pulse: (!) 50  Resp: 18  Temp: 97.9 F (36.6 C)  SpO2: 94%   Filed Weights   03/15/19 1115  Weight: 141 lb 3.2 oz (64 kg)    Physical Exam Constitutional:      Appearance: Normal appearance.  HENT:     Head: Normocephalic.     Right Ear: External ear normal.     Left Ear: External ear normal.     Nose: Nose normal.  Eyes:     Conjunctiva/sclera: Conjunctivae normal.  Cardiovascular:     Rate and Rhythm: Normal rate and regular rhythm.     Pulses: Normal pulses.     Heart sounds: Normal heart sounds.  Pulmonary:     Effort: Pulmonary effort is normal.  Abdominal:     General: Bowel sounds are normal.  Musculoskeletal:        General: Normal range of motion.     Cervical back: Normal range of motion.   Skin:    General: Skin is warm.  Neurological:     General: No focal deficit present.     Mental Status: He is alert and oriented to person, place, and time.  Psychiatric:        Mood and Affect: Mood normal.        Behavior: Behavior normal.      LABORATORY DATA:  I have reviewed the labs as listed.  CBC    Component Value Date/Time   WBC 6.2 03/08/2019 1120   RBC 4.24 03/08/2019 1120   HGB 13.1 03/08/2019 1120   HGB 20.2 (HH) 12/08/2016 1049   HCT 40.2 03/08/2019 1120   HCT 58.3 (H) 12/08/2016 1049   PLT 222 03/08/2019 1120   PLT 168 12/08/2016 1049   MCV 94.8 03/08/2019 1120   MCV 101 (H) 12/08/2016 1049   MCH 30.9 03/08/2019 1120   MCHC 32.6 03/08/2019 1120   RDW 12.3 03/08/2019 1120   RDW 14.2 12/08/2016 1049   LYMPHSABS 1.1 03/08/2019 1120   MONOABS 1.0 03/08/2019 1120   EOSABS 0.2 03/08/2019 1120   BASOSABS 0.0 03/08/2019 1120   CMP Latest Ref Rng & Units 03/08/2019 12/17/2018 12/16/2018  Glucose 70 - 99 mg/dL 99 87 106(H)  BUN 8 - 23 mg/dL 11 14 9   Creatinine 0.61 - 1.24 mg/dL 0.74 0.62 0.73  Sodium 135 - 145 mmol/L 131(L) 131(L) 131(L)  Potassium 3.5 - 5.1 mmol/L 4.6 4.5 4.3  Chloride 98 - 111 mmol/L 94(L) 97(L) 94(L)  CO2 22 - 32 mmol/L 26 25 24   Calcium 8.9 - 10.3 mg/dL 9.3 8.4(L) 8.8(L)  Total Protein 6.5 - 8.1 g/dL 7.2 5.9(L) 7.3  Total Bilirubin 0.3 - 1.2 mg/dL 1.0 0.7 0.8  Alkaline Phos 38 - 126 U/L 66 109 149(H)  AST 15 - 41 U/L 18 19 43(H)  ALT 0 - 44 U/L 12 20 33  ASSESSMENT & PLAN:   Erythrocytosis 1.  Secondary erythrocytosis: -Evaluation including Jak 2 V6 32F, exon 12-15, MPL W,CALR mutations were negative.  Erythropoietin was 4.2.  Bone marrow biopsy was not done. -Last phlebotomy was in December 2018. -His CBC has been staying stable since more than 6 months. - We reviewed his blood work.  Hemoglobin is in the normal range.  MCV is normal. - He was hospitalized again in January for acute pancreatitis related to alcohol  abuse.   -As his CBC have come back to normal levels, he does not require any further testing at this time. -Recommend patient continue follow-up with primary care provider.  And follow-up as needed with [place].      [facility] [phone]

## 2019-03-21 NOTE — Assessment & Plan Note (Addendum)
1.  Secondary erythrocytosis: -Evaluation including Jak 2 V6 80F, exon 12-15, MPL W,CALR mutations were negative.  Erythropoietin was 4.2.  Bone marrow biopsy was not done. -Last phlebotomy was in December 2018. -His CBC has been staying stable since more than 6 months. - We reviewed his blood work.  Hemoglobin is in the normal range.  MCV is normal. - He was hospitalized again in January for acute pancreatitis related to alcohol abuse.   -As his CBC have come back to normal levels, he does not require any further testing at this time. -Recommend patient continue follow-up with primary care provider.  And follow-up as needed with [place].

## 2019-04-22 IMAGING — US US ABDOMEN COMPLETE
1 series · 14 of 25 positions shown · non-contrast
Comparison: CT 07/30/2017

CLINICAL DATA: Gallstone pancreatitis

EXAM:
ABDOMEN ULTRASOUND COMPLETE

[Series 1: us abdomen complete · 0.18mm/px · 14 of 95 slices shown]
[im 1/95]
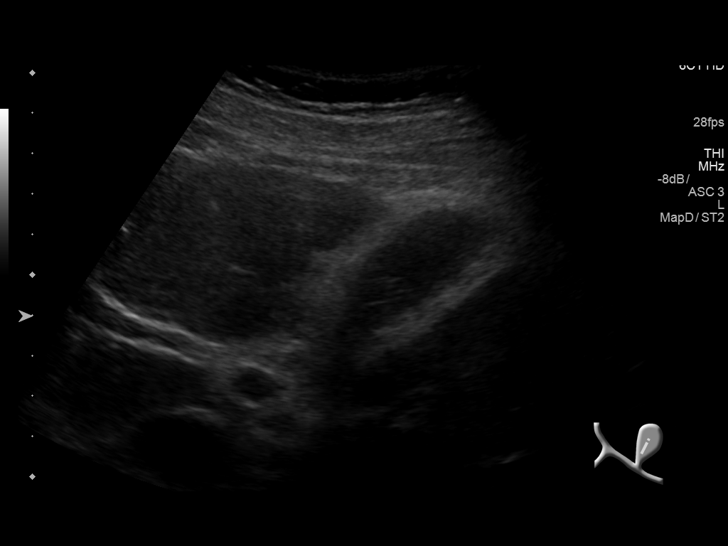
[im 8/95]
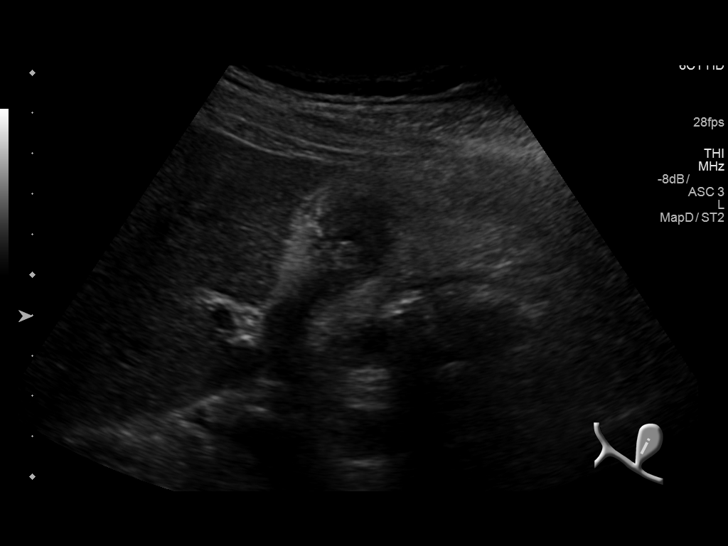
[im 16/95]
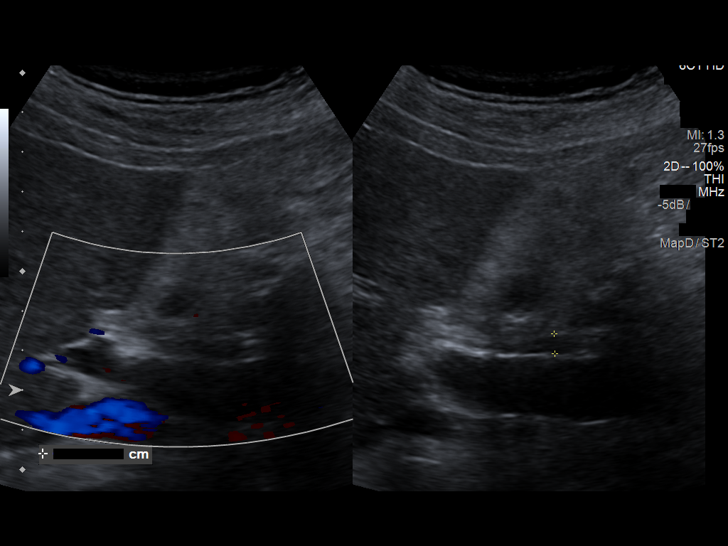
[im 24/95]
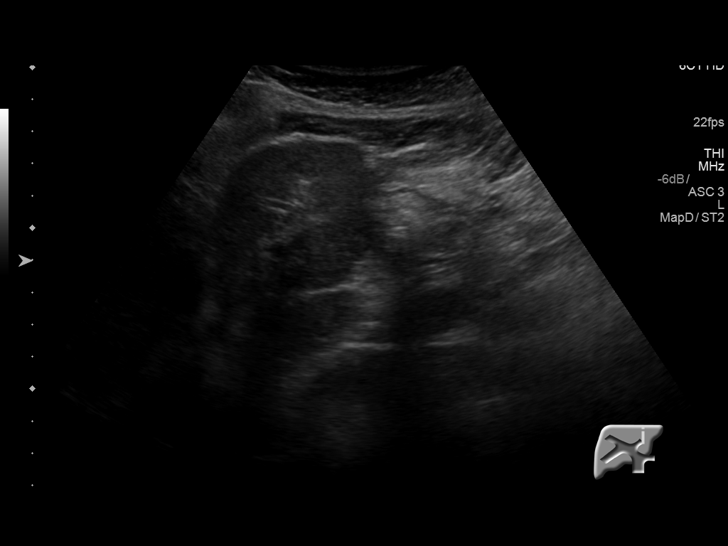
[im 32/95]
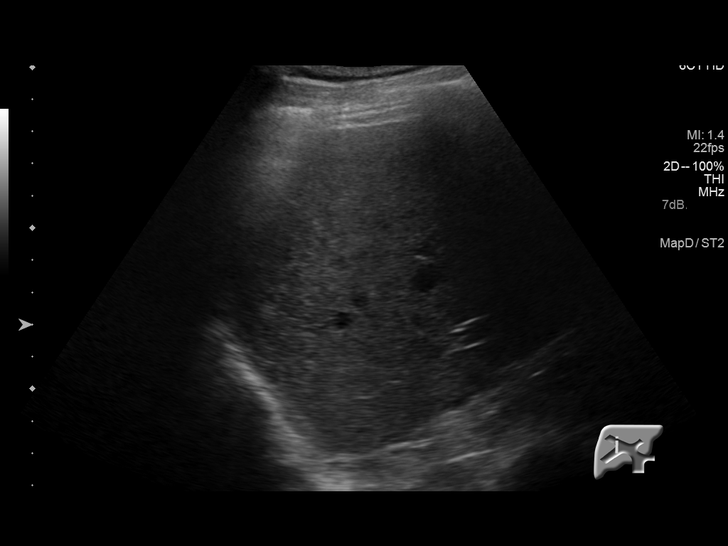
[im 36/95]
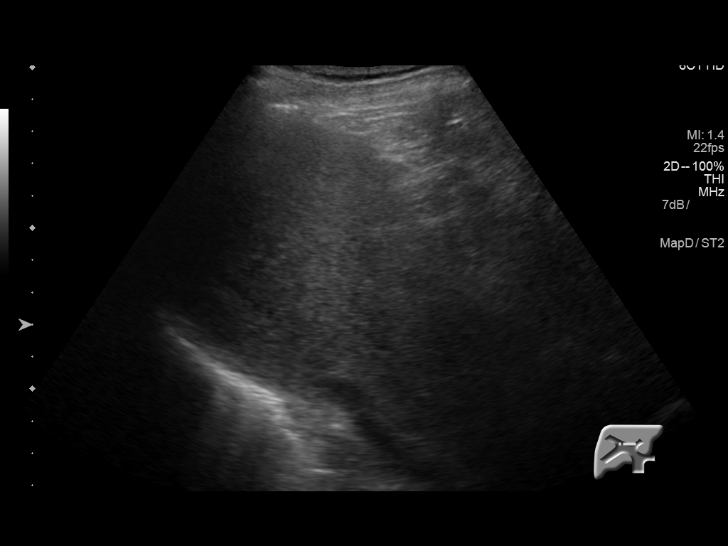
[im 44/95]
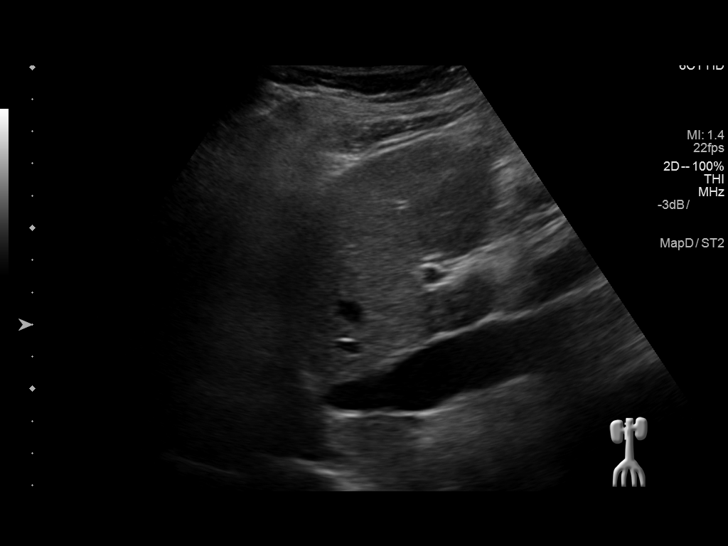
[im 51/95]
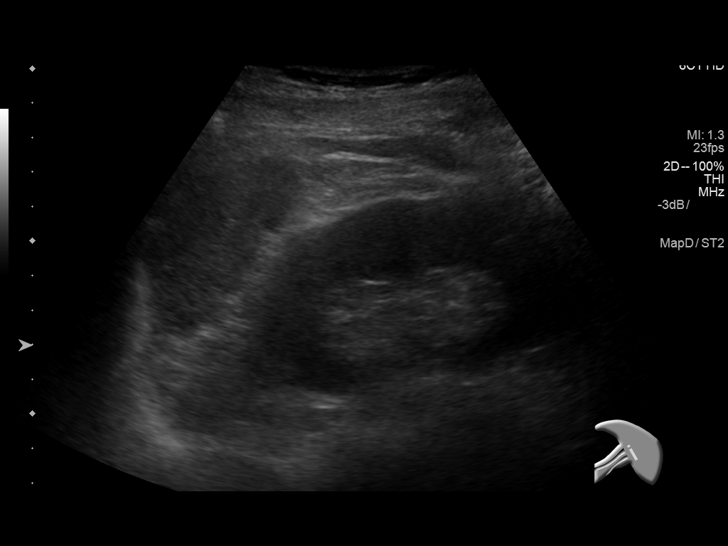
[im 59/95]
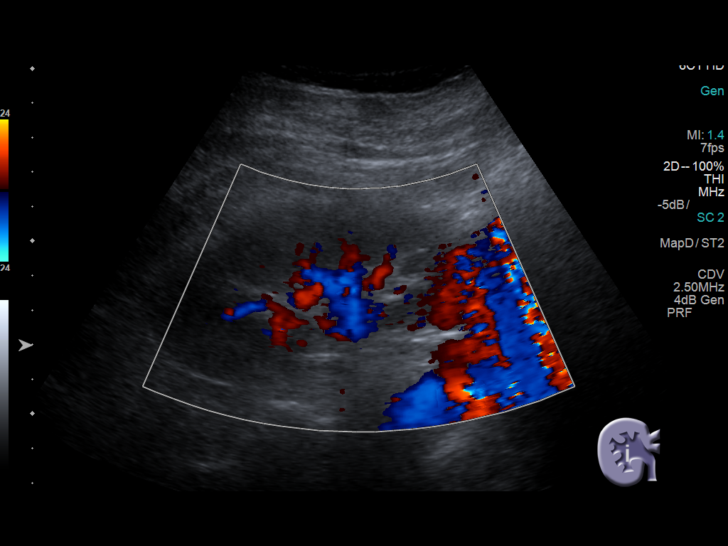
[im 63/95]
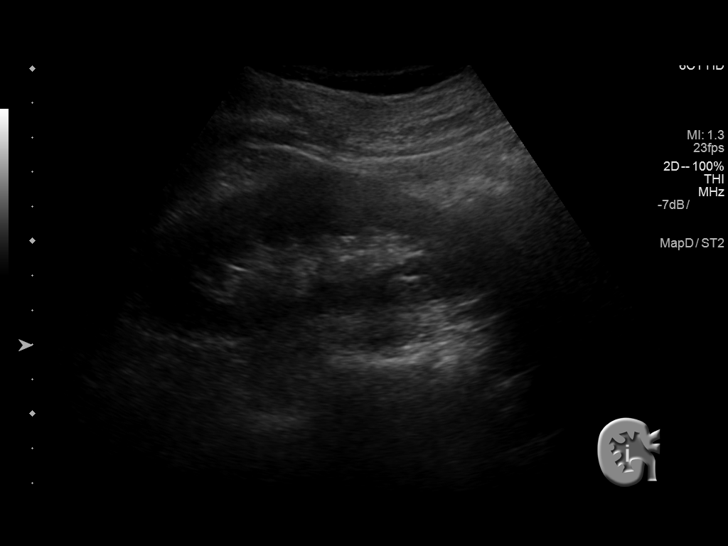
[im 71/95]
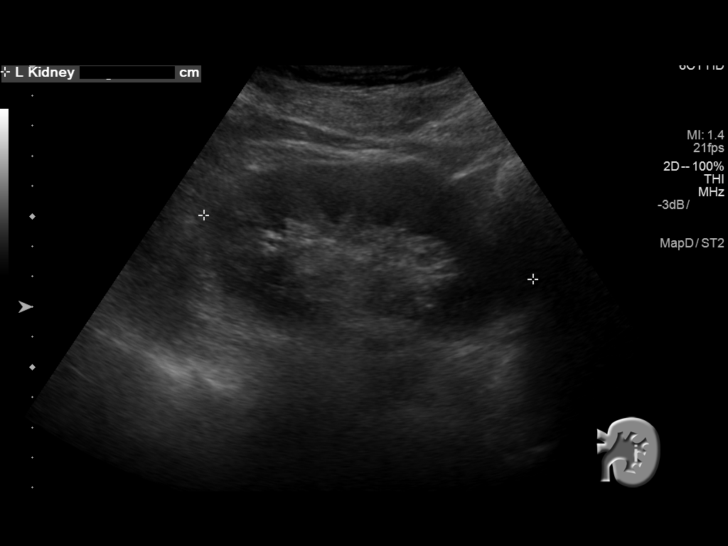
[im 79/95]
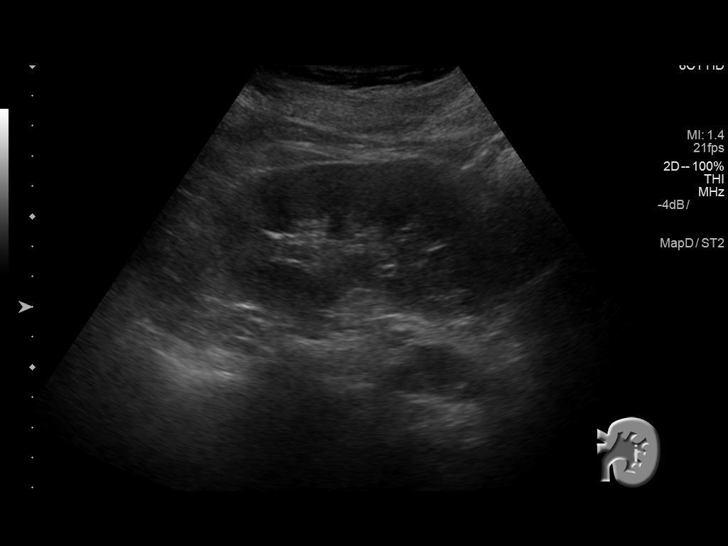
[im 87/95]
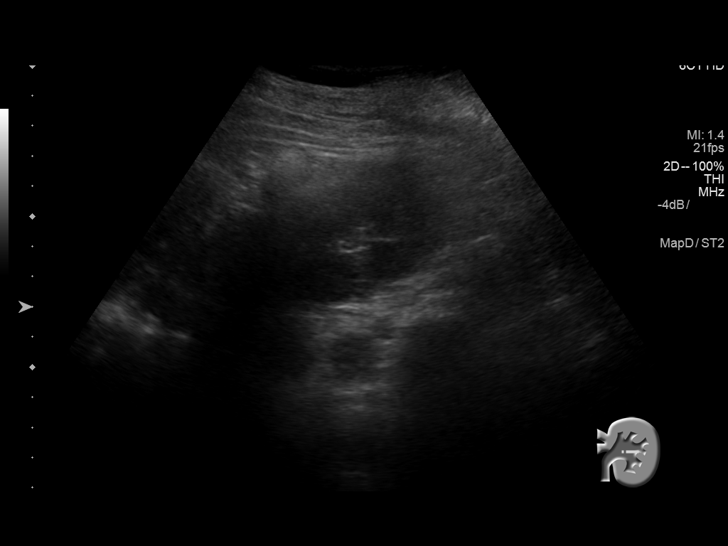
[im 95/95]
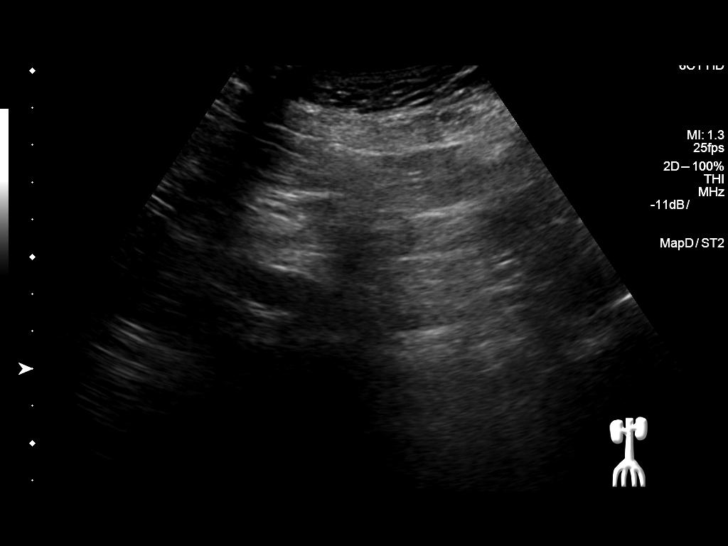

[14 of 25 positions shown; findings below may reference images not displayed]

FINDINGS: Gallbladder: Small multiple gallstones within lumen gallbladder. The
gallbladder wall is mildly thickened at 5 mm. Gallbladder is not
distended. No sonographic Murphy's sign. Small amount
pericholecystic fluid.

Common bile duct: Diameter: Normal at 4 mm

Liver: No focal lesion identified. Mild increase in liver
echogenicity within normal limits in parenchymal echogenicity.
Portal vein is patent on color Doppler imaging with normal direction
of blood flow towards the liver.

IVC: No abnormality visualized.

Pancreas: Visualized portion unremarkable.

Spleen: Size and appearance within normal limits.

Right Kidney: Length: 10.0 cm. Echogenicity within normal limits. No
mass or hydronephrosis visualized.

Left Kidney: Length: 11.2 cm.  No hydronephrosis

Abdominal aorta: No aneurysm visualized.

Other findings: None.
IMPRESSION: 1. Cholelithiasis and gallbladder wall thickening suggests chronic
cholecystitis
2. No biliary duct dilatation.  Common bile duct normal.
3. Increased hepatic echogenicity.

## 2019-06-13 ENCOUNTER — Telehealth (HOSPITAL_COMMUNITY): Payer: Self-pay

## 2019-06-13 ENCOUNTER — Other Ambulatory Visit: Payer: Self-pay | Admitting: *Deleted

## 2019-06-13 DIAGNOSIS — I739 Peripheral vascular disease, unspecified: Secondary | ICD-10-CM

## 2019-06-13 DIAGNOSIS — Z95828 Presence of other vascular implants and grafts: Secondary | ICD-10-CM

## 2019-06-13 NOTE — Telephone Encounter (Signed)

## 2019-06-14 ENCOUNTER — Ambulatory Visit (INDEPENDENT_AMBULATORY_CARE_PROVIDER_SITE_OTHER)
Admission: RE | Admit: 2019-06-14 | Discharge: 2019-06-14 | Disposition: A | Discharge status: Home / Self Care | Payer: Medicaid Other | Source: Ambulatory Visit | Attending: Vascular Surgery | Admitting: Vascular Surgery

## 2019-06-14 ENCOUNTER — Other Ambulatory Visit: Payer: Self-pay

## 2019-06-14 ENCOUNTER — Ambulatory Visit (INDEPENDENT_AMBULATORY_CARE_PROVIDER_SITE_OTHER): Payer: Medicaid Other | Admitting: Vascular Surgery

## 2019-06-14 ENCOUNTER — Ambulatory Visit (HOSPITAL_COMMUNITY)
Admission: RE | Admit: 2019-06-14 | Discharge: 2019-06-14 | Disposition: A | Discharge status: Home / Self Care | Payer: Medicaid Other | Source: Ambulatory Visit | Attending: Vascular Surgery | Admitting: Vascular Surgery

## 2019-06-14 ENCOUNTER — Encounter: Payer: Self-pay | Admitting: Vascular Surgery

## 2019-06-14 VITALS — BP 117/69 | HR 56 | Temp 97.3°F | Resp 16 | Ht 70.0 in | Wt 145.0 lb

## 2019-06-14 DIAGNOSIS — I739 Peripheral vascular disease, unspecified: Secondary | ICD-10-CM | POA: Diagnosis present

## 2019-06-14 DIAGNOSIS — Z95828 Presence of other vascular implants and grafts: Secondary | ICD-10-CM | POA: Insufficient documentation

## 2019-06-14 NOTE — Progress Notes (Signed)
Patient name: Brady Bell MRN: [id number] DOB: Jan 22, 1956 Sex: male  REASON FOR VISIT: 1 year follow-up for bypass graft surveillance   HPI: Brady Bell is a 64 y.o. male that presents for interval 1 year follow-up of his left lower extremity bypass.  He initially underwent a left common femoral to AT bypass with non-reversed ipsilateral great saphenous vein in 2013 by Dr. Imogene Burn for critical limb ischemia.  Most recently underwent left iliofemoral endarterectomy in January 2019 also by Dr. Imogene Burn.  He reports no new issues.  No rest pain or claudication.  We have previously followed inflow stenosis that was not significant.  States he has now stopped smoking since his last visit.  No new complaints regarding lower extremities  Had arthroscopic left knee surgery last year.   Past Medical History:  Diagnosis Date  . Chronic ankle pain   . Chronic knee pain   . COPD (chronic obstructive pulmonary disease) ([facility]) 09/30/2018   on CT chest  . GERD (gastroesophageal reflux disease)   . Hepatitis    Hep A in 1980's  . History of atrial flutter    Documented April/May 2019 in the setting of pancreatitis  . Hypoglycemia   . Migraines   . Pancreatitis   . Peripheral vascular disease ([facility])   . Recurrent sinus infections   . Seizures ([facility])   . Shingles     Past Surgical History:  Procedure Laterality Date  . ABDOMINAL AORTOGRAM N/A 04/23/2017   Procedure: ABDOMINAL AORTOGRAM;  Surgeon: Fransisco Hertz, MD;  Location: [facility] INVASIVE [facility] LAB;  Service: Cardiovascular;  Laterality: N/A;  . CHOLECYSTECTOMY N/A 09/23/2017   Procedure: LAPAROSCOPIC CHOLECYSTECTOMY;  Surgeon: Franky Macho, MD;  [facility];  Service: General;  Laterality: N/A;  . ENDARTERECTOMY FEMORAL Left 04/28/2017   Procedure: ENDARTERECTOMY LEFT ILIO-FEMORAL ARTERY;  Surgeon: Fransisco Hertz, MD;  Location: [facility] OR;  Service: Vascular;  Laterality: Left;  . FASCIOTOMY  09/06/2011   Procedure: FASCIOTOMY;  Surgeon: Fransisco Hertz, MD;   Location: [facility] OR;  Service: Vascular;  Laterality: Left;  . FEMORAL-TIBIAL BYPASS GRAFT  09/06/2011   Procedure: BYPASS GRAFT FEMORAL-TIBIAL ARTERY;  Surgeon: Fransisco Hertz, MD;  Location: [facility] OR;  Service: Vascular;  Laterality: Left;  . KNEE CARTILAGE SURGERY Left   . LOWER EXTREMITY ANGIOGRAM N/A 09/05/2011   Procedure: LOWER EXTREMITY ANGIOGRAM;  Surgeon: Sherren Kerns, MD;  Location: [facility] CATH LAB;  Service: Cardiovascular;  Laterality: N/A;  . LOWER EXTREMITY ANGIOGRAM Left 04/15/2012   Procedure: LOWER EXTREMITY ANGIOGRAM;  Surgeon: Fransisco Hertz, MD;  Location: [facility] CATH LAB;  Service: Cardiovascular;  Laterality: Left;  . LOWER EXTREMITY ANGIOGRAPHY Bilateral 04/23/2017   Procedure: Lower Extremity Angiography;  Surgeon: Fransisco Hertz, MD;  Location: [facility] INVASIVE CV LAB;  Service: Cardiovascular;  Laterality: Bilateral;  . PATCH ANGIOPLASTY Left 04/28/2017   Procedure: ILIO-FEMORAL ARTERY PATCH ANGIOPLASTY USING Livia Snellen BIOLOGIC PATCH;  Surgeon: Fransisco Hertz, MD;  Location: [facility] OR;  Service: Vascular;  Laterality: Left;  . PR VEIN BYPASS GRAFT,AORTO-FEM-POP  09/06/11   Left   . TOOTH EXTRACTION  June-July-Aug. 2015   Several     Family History  Problem Relation Age of Onset  . Alcohol abuse Father   . Kidney failure Father   . Other Mother        "age"  . Diabetes Other     SOCIAL HISTORY: Social History   Tobacco Use  . Smoking status: Current Every Day Smoker  Packs/day: 0.50    Years: 30.00    Pack years: 15.00    Types: Cigarettes  . Smokeless tobacco: Never Used  Substance Use Topics  . Alcohol use: Yes    Alcohol/week: 3.0 standard drinks    Types: 3 Standard drinks or equivalent per week    Comment: Few drinks per day    No Known Allergies  Current Outpatient Medications  Medication Sig Dispense Refill  . aspirin EC 81 MG tablet Take 81 mg by mouth daily.    . cycloSPORINE (RESTASIS) 0.05 % ophthalmic emulsion Place 1 drop into both eyes 2 (two) times daily.    Marland Kitchen  diltiazem (CARDIZEM CD) 180 MG 24 hr capsule Take 1 capsule (180 mg total) by mouth daily. 30 capsule 12  . divalproex (DEPAKOTE ER) 500 MG 24 hr tablet Take one tab at bedtime.  Please call [phone] to schedule yearly follow up. 90 tablet 0  . eletriptan (RELPAX) 40 MG tablet Take 40 mg by mouth every 2 (two) hours as needed for migraine or headache. May repeat in 2 hours if headache persists or recurs.     . fluticasone (FLONASE) 50 MCG/ACT nasal spray Place 2 sprays into both nostrils daily.    Marland Kitchen gabapentin (NEURONTIN) 300 MG capsule Take 300 mg by mouth daily.     . Multiple Vitamin (MULTIVITAMIN WITH MINERALS) TABS tablet Take 1 tablet by mouth daily.    . ondansetron (ZOFRAN ODT) 4 MG disintegrating tablet Take 1 tablet (4 mg total) by mouth every 8 (eight) hours as needed for nausea or vomiting. 20 tablet 0  . pantoprazole (PROTONIX) 40 MG tablet Take 1 tablet (40 mg total) by mouth daily before breakfast. 30 tablet 12  . SUMAtriptan (IMITREX) 100 MG tablet Take 1 tablet (100 mg total) by mouth every 2 (two) hours as needed for migraine. May repeat in 2 hours if headache persists or recurs. 12 tablet 11  . tamsulosin (FLOMAX) 0.4 MG CAPS capsule Take 0.4 mg by mouth daily.    Marland Kitchen thiamine 100 MG tablet Take 1 tablet (100 mg total) by mouth daily.    . traZODone (DESYREL) 50 MG tablet Take 50 mg by mouth at bedtime.     No current facility-administered medications for this visit.    REVIEW OF SYSTEMS:  [X]  denotes positive finding, [ ]  denotes negative finding Cardiac  Comments:  Chest pain or chest pressure:    Shortness of breath upon exertion:    Short of breath when lying flat:    Irregular heart rhythm:        Vascular    Pain in calf, thigh, or hip brought on by ambulation:    Pain in feet at night that wakes you up from your sleep:     Blood clot in your veins:    Leg swelling:         Pulmonary    Oxygen at home:    Productive cough:     Wheezing:         Neurologic      Sudden weakness in arms or legs:     Sudden numbness in arms or legs:     Sudden onset of difficulty speaking or slurred speech:    Temporary loss of vision in one eye:     Problems with dizziness:         Gastrointestinal    Blood in stool:     Vomited blood:  Genitourinary    Burning when urinating:     Blood in urine:        Psychiatric    Major depression:         Hematologic    Bleeding problems:    Problems with blood clotting too easily:        Skin    Rashes or ulcers:        Constitutional    Fever or chills:      PHYSICAL EXAM: Vitals:   06/14/19 1137  BP: 117/69  Pulse: (!) 56  Resp: 16  Temp: (!) 97.3 F (36.3 C)  TempSrc: Temporal  SpO2: 100%  Weight: 145 lb (65.8 kg)  Height: 5\' 10"  (1.778 m)    GENERAL: The patient is a well-nourished male, in no acute distress. The vital signs are documented above. CARDIAC: There is a regular rate and rhythm.  VASCULAR:  Well healed left groin scar. Palpable bilateral femoral pulse Palpable left DP pulse in foot Palpable right PT pulse PULMONARY: No respiratory issues. ABDOMEN: Soft and non-tender with normal pitched bowel sounds.  MUSCULOSKELETAL: There are no major deformities or cyanosis. NEUROLOGIC: No focal weakness or paresthesias are detected.   DATA:   I reviewed his ABIs which are 1.16 on the right triphasic and 0.95 on the left biphasic  I reviewed left lower extremity arterial duplex shows patent bypass with area of inflow stenosis with peak systolic velocity 308    Assessment/Plan:  63-year male presents for ongoing surveillance and 1 year follow-up for left femoral to anterior tibial artery bypass by Dr. .  Ultimately his bypass remains patent on duplex today.  He continues have a palpable pulse in the left foot in the dorsalis pedis.  He remains asymptomatic.  I congratulated him on stopping smoking.  There is an area of inflow stenosis with a velocity of 308 suggesting a 50  to 70% stenosis.  We have recommended close interval surveillance at 87-month follow-up now given this area of inflow we are watching.  Nice palpable femoral pulse on exam.  He may end up requiring a redo femoral endarterectomy as I would imagine that is where the inflow disease is based on duplex.  8-month, MD [facility] Office: [phone]  [phone]

## 2019-06-15 ENCOUNTER — Other Ambulatory Visit: Payer: Self-pay | Admitting: *Deleted

## 2019-06-15 DIAGNOSIS — I739 Peripheral vascular disease, unspecified: Secondary | ICD-10-CM

## 2019-06-15 DIAGNOSIS — Z95828 Presence of other vascular implants and grafts: Secondary | ICD-10-CM

## 2020-01-10 ENCOUNTER — Telehealth: Payer: Self-pay | Admitting: Acute Care

## 2020-01-10 NOTE — Telephone Encounter (Signed)
This guy is a grant patient. He has spoken with Angelique Blonder. He is having a telephone shared decision making visit 10/18 at 6 pm. I have not sent him any emails. He has been communicating through [place]. I don't think I have ever spoken with him.  We can check with Angelique Blonder to see if she was going to email him. Thanks so much

## 2020-01-10 NOTE — Telephone Encounter (Signed)
Spoke with pt and reminded of televisit with Kandice Robinsons, NP on 01/16/20 at 6:00 for lung cancer screening grant. Pt verbalized understanding. I reminded pt that I had sent him an email link to get set up with MYChart. Pt had forgotten that but rembered when I spoke with him. Nothing further needed at this time.

## 2020-01-10 NOTE — Telephone Encounter (Signed)
Called and spoke with patient, he states he called a week ago and the person that answered the phone was rude to him.  He states he pressed the button for the provider line and was on hold for >10 minutes, when someone came to the phone, he was told that they had 18 calls.  He states that he understands that everyone is busy and maybe she was just having a bad day. The last time he spoke with Brady Bell she told him she would send him some information and he just wants to be ready for his televisit on Monday for his visit with her.  He doesn't know if she did not have his e-mail correctly or what, but has not received anything from Sarah.  His e-mail address is Brady Bell.  I advised him I would contact Brady Bell and after we hear back from her we will contact him.  He verbalized understanding.  Brady Bell, Should this patient have received any communication via e-mail from you?  Please advise

## 2020-01-16 ENCOUNTER — Other Ambulatory Visit: Payer: Self-pay

## 2020-01-16 ENCOUNTER — Other Ambulatory Visit: Payer: Self-pay | Admitting: Acute Care

## 2020-01-16 ENCOUNTER — Encounter: Payer: Self-pay | Admitting: Acute Care

## 2020-01-16 ENCOUNTER — Other Ambulatory Visit: Payer: Self-pay | Admitting: *Deleted

## 2020-01-16 DIAGNOSIS — Z87891 Personal history of nicotine dependence: Secondary | ICD-10-CM

## 2020-01-16 NOTE — Progress Notes (Signed)
Shared Decision-Making Visit for Lung Cancer Screening [phone] )  Lung Cancer Screening Criteria 68-7-years of age 62+ pack year smoking history No Recent History of coughing up blood   No Unexplained weight loss of > 15 pounds in the last 6 months. No Prior History Lung / other cancer  (Diagnosis within the last 5 years already requiring surveillance chest CT Scans). Pt is a current smoker, or former smoker who has quit within the last 15 years.  This patient meets the criterial noted above and is asymptomatic for any signs or symptoms of lung cancer.  The Shared Decision-Making Visit discussion included risks and benefits of screening, potential for follow up diagnostic testing for abnormal scans, potential for false positive tests, over diagnosis, and discussion about total radiation exposure. Patient stated willingness to undergo diagnostics and treatment as needed. Current smokers were counseled on smoking cessation as the single most powerful action they can take to decrease their risk of lung cancer, pulmonary disease, heart disease and stroke. They were given a resource card with information on receiving free nicotine replacement therapy , and information about free smoking cessation classes.  Pt understands that this scan is being paid for by a grant obtained by the [facility]. We discussed  that there is no guarantee of additional grant money from year to year as these grants are not guaranteed to programs on an annual basis.They have to be applied for and they are awarded based on county need, and utilization of the previous years funds..   Former smoker Quit 2021 with a 45 pack year smoking history Ticket number 7 No hemoptysis  Bevelyn Ngo, MSN, AGACNP-BC [facility] See Amion for personal pager PCCM on call pager [phone]  01/16/2020 5:55 PM

## 2020-01-16 NOTE — Patient Instructions (Signed)
Thank you for participating in the [place] [facility]. It was our pleasure to meet you today. We will call you with the results of your scan within the next few days. Your scan will be assigned a Lung RADS category score by the physicians reading the scans.  This Lung RADS score determines follow up scanning.  See below for description of categories, and follow up screening recommendations. We will be in touch to schedule your follow up screening annually or based on recommendations of our providers. We will fax a copy of your scan results to your Primary Care Physician, or the physician who referred you to the program, to ensure they have the results. Please call the office if you have any questions or concerns regarding your scanning experience or results.  Our office number is [phone]. Please speak with Denise Phelps, RN. She is our Lung Cancer Screening RN. If she is unavailable when you call, please have the office staff send her a message. She will return your call at her earliest convenience. Remember, if your scan is normal, we will scan you annually as long as you continue to meet the criteria for the program. (Age 55-77, Current smoker or smoker who has quit within the last 15 years). If you are a smoker, remember, quitting is the single most powerful action that you can take to decrease your risk of lung cancer and other pulmonary, breathing related problems. We know quitting is hard, and we are here to help.  Please let us know if there is anything we can do to help you meet your goal of quitting. If you are a former smoker, congratulations. We are proud of you! Remain smoke free! Remember you can refer friends or family members through the number above.  We will screen them to make sure they meet criteria for the program. Thank you for helping us take better care of you by participating in Lung Screening.  Lung RADS Categories:  Lung RADS 1: no nodules  or definitely non-concerning nodules.  Recommendation is for a repeat annual scan in 12 months.  Lung RADS 2:  nodules that are non-concerning in appearance and behavior with a very low likelihood of becoming an active cancer. Recommendation is for a repeat annual scan in 12 months.  Lung RADS 3: nodules that are probably non-concerning , includes nodules with a low likelihood of becoming an active cancer.  Recommendation is for a 6-month repeat screening scan. Often noted after an upper respiratory illness. We will be in touch to make sure you have no questions, and to schedule your 6-month scan.  Lung RADS 4 A: nodules with concerning findings, recommendation is most often for a follow up scan in 3 months or additional testing based on our provider's assessment of the scan. We will be in touch to make sure you have no questions and to schedule the recommended 3 month follow up scan.  Lung RADS 4 B:  indicates findings that are concerning. We will be in touch with you to schedule additional diagnostic testing based on our provider's  assessment of the scan.   

## 2020-01-27 ENCOUNTER — Telehealth: Payer: Self-pay | Admitting: Acute Care

## 2020-01-27 NOTE — Telephone Encounter (Signed)
Spoke with Dr Otilio Saber office and advised that we have pt scheduled for a Lung cancer screening Chest CT on 02/06/20 that will be paid for by a grant that we have for this. We will send results once they are available. Nothing further needed at this time.

## 2020-01-31 ENCOUNTER — Other Ambulatory Visit (HOSPITAL_COMMUNITY): Payer: Medicaid Other

## 2020-01-31 ENCOUNTER — Ambulatory Visit: Payer: Medicaid Other | Admitting: Vascular Surgery

## 2020-01-31 ENCOUNTER — Encounter (HOSPITAL_COMMUNITY): Payer: Medicaid Other

## 2020-02-06 ENCOUNTER — Ambulatory Visit
Admission: RE | Admit: 2020-02-06 | Discharge: 2020-02-06 | Disposition: A | Discharge status: Home / Self Care | Payer: Medicaid Other | Source: Ambulatory Visit | Attending: Acute Care | Admitting: Acute Care

## 2020-02-06 ENCOUNTER — Other Ambulatory Visit: Payer: Self-pay

## 2020-02-06 DIAGNOSIS — Z87891 Personal history of nicotine dependence: Secondary | ICD-10-CM

## 2020-02-07 ENCOUNTER — Ambulatory Visit (HOSPITAL_COMMUNITY)
Admission: RE | Admit: 2020-02-07 | Discharge: 2020-02-07 | Disposition: A | Discharge status: Home / Self Care | Payer: Medicaid Other | Source: Ambulatory Visit | Attending: Vascular Surgery | Admitting: Vascular Surgery

## 2020-02-07 ENCOUNTER — Encounter: Payer: Self-pay | Admitting: Vascular Surgery

## 2020-02-07 ENCOUNTER — Ambulatory Visit (INDEPENDENT_AMBULATORY_CARE_PROVIDER_SITE_OTHER)
Admission: RE | Admit: 2020-02-07 | Discharge: 2020-02-07 | Disposition: A | Discharge status: Home / Self Care | Payer: Medicaid Other | Source: Ambulatory Visit | Attending: Vascular Surgery | Admitting: Vascular Surgery

## 2020-02-07 ENCOUNTER — Ambulatory Visit (INDEPENDENT_AMBULATORY_CARE_PROVIDER_SITE_OTHER): Payer: Medicaid Other | Admitting: Vascular Surgery

## 2020-02-07 VITALS — BP 143/87 | HR 63 | Temp 97.6°F | Resp 16 | Ht 70.0 in | Wt 140.0 lb

## 2020-02-07 DIAGNOSIS — Z95828 Presence of other vascular implants and grafts: Secondary | ICD-10-CM | POA: Insufficient documentation

## 2020-02-07 DIAGNOSIS — I739 Peripheral vascular disease, unspecified: Secondary | ICD-10-CM | POA: Diagnosis not present

## 2020-02-07 NOTE — Progress Notes (Signed)
Please call patient and let them  know their  low dose Ct was read as a Lung RADS 1, negative study: no nodules or definitely benign nodules. Radiology recommendation is for a repeat LDCT in 12 months. .Please let them  know we will order and schedule their  annual screening scan for 01/2021. Please let them  know there was notation of CAD on their  scan.  Please remind the patient  that this is a non-gated exam therefore degree or severity of disease  cannot be determined. Please have them  follow up with their PCP regarding potential risk factor modification, dietary therapy or pharmacologic therapy if clinically indicated. Pt.  is not  currently on statin therapy. Please place order for annual  screening scan for  01/2021 and fax results to PCP. Thanks so much. 

## 2020-02-07 NOTE — Progress Notes (Signed)
Patient name: Brady Bell MRN: [id number] DOB: 17-Oct-1955 Sex: male  REASON FOR VISIT: 6 month follow-up for bypass graft surveillance   HPI: Brady Bell is a 64 y.o. male that presents for 6 month follow-up of his left lower extremity bypass.  He initially underwent a left common femoral to AT bypass with non-reversed ipsilateral great saphenous vein in 2013 by Dr. Imogene Burn for critical limb ischemia.  Most recently underwent left iliofemoral endarterectomy in January 2019 also by Dr. Imogene Burn.  Has done well over past 6 months.  We are watching inflow stenosis.  On today's visit reports no rest pain or claudication.  States he has now stopped smoking and drinking.   Past Medical History:  Diagnosis Date  . Chronic ankle pain   . Chronic knee pain   . COPD (chronic obstructive pulmonary disease) (HCC) 09/30/2018   on CT chest  . GERD (gastroesophageal reflux disease)   . Hepatitis    Hep A in 1980's  . History of atrial flutter    Documented April/May 2019 in the setting of pancreatitis  . Hypoglycemia   . Migraines   . Pancreatitis   . Peripheral vascular disease (HCC)   . Recurrent sinus infections   . Seizures (HCC)   . Shingles     Past Surgical History:  Procedure Laterality Date  . ABDOMINAL AORTOGRAM N/A 04/23/2017   Procedure: ABDOMINAL AORTOGRAM;  Surgeon: Fransisco Hertz, MD;  Location: [facility] INVASIVE CV LAB;  Service: Cardiovascular;  Laterality: N/A;  . CHOLECYSTECTOMY N/A 09/23/2017   Procedure: LAPAROSCOPIC CHOLECYSTECTOMY;  Surgeon: Franky Macho, MD;  Location: [facility] ORS;  Service: General;  Laterality: N/A;  . ENDARTERECTOMY FEMORAL Left 04/28/2017   Procedure: ENDARTERECTOMY LEFT ILIO-FEMORAL ARTERY;  Surgeon: Fransisco Hertz, MD;  Location: [facility] OR;  Service: Vascular;  Laterality: Left;  . FASCIOTOMY  09/06/2011   Procedure: FASCIOTOMY;  Surgeon: Fransisco Hertz, MD;  Location: [facility] OR;  Service: Vascular;  Laterality: Left;  . FEMORAL-TIBIAL BYPASS GRAFT  09/06/2011   Procedure:  BYPASS GRAFT FEMORAL-TIBIAL ARTERY;  Surgeon: Fransisco Hertz, MD;  Location[facility] OR;  Service: Vascular;  Laterality: Left;  . KNEE CARTILAGE SURGERY Left   . LOWER EXTREMITY ANGIOGRAM N/A 09/05/2011   Procedure: LOWER EXTREMITY ANGIOGRAM;  Surgeon: Sherren Kerns, MD;  Location: [facility] CATH LAB;  Service: Cardiovascular;  Laterality: N/A;  . LOWER EXTREMITY ANGIOGRAM Left 04/15/2012   Procedure: LOWER EXTREMITY ANGIOGRAM;  Surgeon: Fransisco Hertz, MD;  Location: [facility] CATH LAB;  Service: Cardiovascular;  Laterality: Left;  . LOWER EXTREMITY ANGIOGRAPHY Bilateral 04/23/2017   Procedure: Lower Extremity Angiography;  Surgeon: Fransisco Hertz, MD;  Location: [facility] INVASIVE CV LAB;  Service: Cardiovascular;  Laterality: Bilateral;  . PATCH ANGIOPLASTY Left 04/28/2017   Procedure: ILIO-FEMORAL ARTERY PATCH ANGIOPLASTY USING Livia Snellen BIOLOGIC PATCH;  Surgeon: Fransisco Hertz, MD;  Location: [facility] OR;  Service: Vascular;  Laterality: Left;  . PR VEIN BYPASS GRAFT,AORTO-FEM-POP  09/06/11   Left   . TOOTH EXTRACTION  June-July-Aug. 2015   Several     Family History  Problem Relation Age of Onset  . Alcohol abuse Father   . Kidney failure Father   . Other Mother        "age"  . Diabetes Other     SOCIAL HISTORY: Social History   Tobacco Use  . Smoking status: Former Smoker    Packs/day: 1.00    Years: 45.00    Pack years: 45.00  Types: Cigarettes    Quit date: 04/2019    Years since quitting: 0.8  . Smokeless tobacco: Never Used  Substance Use Topics  . Alcohol use: Yes    Alcohol/week: 3.0 standard drinks    Types: 3 Standard drinks or equivalent per week    Comment: Few drinks per day    No Known Allergies  Current Outpatient Medications  Medication Sig Dispense Refill  . amphetamine-dextroamphetamine (ADDERALL) 10 MG tablet Take 10 mg by mouth daily.    Marland Kitchen aspirin EC 81 MG tablet Take 81 mg by mouth daily.    . cycloSPORINE (RESTASIS) 0.05 % ophthalmic emulsion Place 1 drop into both eyes 2 (two)  times daily.    Marland Kitchen diltiazem (CARDIZEM CD) 180 MG 24 hr capsule Take 1 capsule (180 mg total) by mouth daily. 30 capsule 12  . eletriptan (RELPAX) 40 MG tablet Take 40 mg by mouth every 2 (two) hours as needed for migraine or headache. May repeat in 2 hours if headache persists or recurs.     . gabapentin (NEURONTIN) 300 MG capsule Take 300 mg by mouth daily.     . Multiple Vitamin (MULTIVITAMIN WITH MINERALS) TABS tablet Take 1 tablet by mouth daily.    . pantoprazole (PROTONIX) 40 MG tablet Take 1 tablet (40 mg total) by mouth daily before breakfast. 30 tablet 12  . SUMAtriptan (IMITREX) 100 MG tablet Take 1 tablet (100 mg total) by mouth every 2 (two) hours as needed for migraine. May repeat in 2 hours if headache persists or recurs. 12 tablet 11  . tamsulosin (FLOMAX) 0.4 MG CAPS capsule Take 0.4 mg by mouth daily.    . traZODone (DESYREL) 50 MG tablet Take 50 mg by mouth at bedtime.    . divalproex (DEPAKOTE ER) 500 MG 24 hr tablet Take one tab at bedtime.  Please call [phone] to schedule yearly follow up. 90 tablet 0  . fluticasone (FLONASE) 50 MCG/ACT nasal spray Place 2 sprays into both nostrils daily.    . ondansetron (ZOFRAN ODT) 4 MG disintegrating tablet Take 1 tablet (4 mg total) by mouth every 8 (eight) hours as needed for nausea or vomiting. (Patient not taking: Reported on 02/07/2020) 20 tablet 0  . thiamine 100 MG tablet Take 1 tablet (100 mg total) by mouth daily.     No current facility-administered medications for this visit.    REVIEW OF SYSTEMS:  [X]  denotes positive finding, [ ]  denotes negative finding Cardiac  Comments:  Chest pain or chest pressure:    Shortness of breath upon exertion:    Short of breath when lying flat:    Irregular heart rhythm:        Vascular    Pain in calf, thigh, or hip brought on by ambulation:    Pain in feet at night that wakes you up from your sleep:     Blood clot in your veins:    Leg swelling:         Pulmonary    Oxygen at  home:    Productive cough:     Wheezing:         Neurologic    Sudden weakness in arms or legs:     Sudden numbness in arms or legs:     Sudden onset of difficulty speaking or slurred speech:    Temporary loss of vision in one eye:     Problems with dizziness:         Gastrointestinal  Blood in stool:     Vomited blood:         Genitourinary    Burning when urinating:     Blood in urine:        Psychiatric    Major depression:         Hematologic    Bleeding problems:    Problems with blood clotting too easily:        Skin    Rashes or ulcers:        Constitutional    Fever or chills:      PHYSICAL EXAM: Vitals:   02/07/20 1210  BP: (!) 143/87  Pulse: 63  Resp: 16  Temp: 97.6 F (36.4 C)  TempSrc: Temporal  SpO2: 94%  Weight: 140 lb (63.5 kg)  Height: 5\' 10"  (1.778 m)    GENERAL: The patient is a well-nourished male, in no acute distress. The vital signs are documented above. CARDIAC: There is a regular rate and rhythm.  VASCULAR:  Well healed left groin scar. Palpable left femoral pulse Palpable left DP pulse in foot PULMONARY: No respiratory issues. ABDOMEN: Soft and non-tender with normal pitched bowel sounds.  MUSCULOSKELETAL: There are no major deformities or cyanosis. NEUROLOGIC: No focal weakness or paresthesias are detected.   DATA:   I reviewed his ABIs which are 1.18 on the right triphasic and 1.04 on the left triphasic  I reviewed left lower extremity arterial duplex shows patent bypass with area of inflow stenosis with peak systolic velocity 367 suggesting 50-74% moderate stenosis    Assessment/Plan:  64-year male presents for ongoing surveillance and 6 month follow-up for left femoral to anterior tibial artery bypass by Dr. .  Ultimately his bypass remains patent on duplex today.  He continues have a palpable pulse in the left foot in the dorsalis pedis and a palpable left groin pulse.  He remains asymptomatic.  There is an area  of inflow stenosis with a velocity of now 367 suggesting a 50 to 70% stenosis.  Given asymptomatic, palpable groin and pedal pulse, and no drop in ABI, I have recommended continued close interval surveillance with 72-month follow-up.  He knows to call with questions or concerns.  8-month, MD [facility] Office: [phone]  [phone]

## 2020-04-06 ENCOUNTER — Other Ambulatory Visit: Payer: Medicaid Other

## 2020-04-06 ENCOUNTER — Other Ambulatory Visit: Payer: Self-pay

## 2020-04-06 DIAGNOSIS — Z20822 Contact with and (suspected) exposure to covid-19: Secondary | ICD-10-CM

## 2020-04-10 LAB — NOVEL CORONAVIRUS, NAA: SARS-CoV-2, NAA: NOT DETECTED

## 2020-08-03 ENCOUNTER — Other Ambulatory Visit: Payer: Self-pay | Admitting: *Deleted

## 2020-08-03 DIAGNOSIS — I739 Peripheral vascular disease, unspecified: Secondary | ICD-10-CM

## 2020-08-03 DIAGNOSIS — Z95828 Presence of other vascular implants and grafts: Secondary | ICD-10-CM

## 2020-08-14 ENCOUNTER — Ambulatory Visit: Payer: Medicaid Other | Admitting: Vascular Surgery

## 2020-08-14 ENCOUNTER — Other Ambulatory Visit (HOSPITAL_COMMUNITY): Payer: Medicaid Other

## 2020-08-14 ENCOUNTER — Encounter (HOSPITAL_COMMUNITY): Payer: Medicaid Other

## 2020-08-21 ENCOUNTER — Ambulatory Visit (INDEPENDENT_AMBULATORY_CARE_PROVIDER_SITE_OTHER): Payer: Medicaid Other | Admitting: Vascular Surgery

## 2020-08-21 ENCOUNTER — Ambulatory Visit (HOSPITAL_COMMUNITY)
Admission: RE | Admit: 2020-08-21 | Discharge: 2020-08-21 | Disposition: A | Discharge status: Home / Self Care | Payer: Medicaid Other | Source: Ambulatory Visit | Attending: Vascular Surgery | Admitting: Vascular Surgery

## 2020-08-21 ENCOUNTER — Ambulatory Visit (INDEPENDENT_AMBULATORY_CARE_PROVIDER_SITE_OTHER)
Admission: RE | Admit: 2020-08-21 | Discharge: 2020-08-21 | Disposition: A | Discharge status: Home / Self Care | Payer: Medicaid Other | Source: Ambulatory Visit | Attending: Vascular Surgery | Admitting: Vascular Surgery

## 2020-08-21 ENCOUNTER — Other Ambulatory Visit: Payer: Self-pay

## 2020-08-21 ENCOUNTER — Encounter: Payer: Self-pay | Admitting: Vascular Surgery

## 2020-08-21 VITALS — BP 123/68 | HR 58 | Temp 98.2°F | Resp 20 | Ht 70.0 in | Wt 136.9 lb

## 2020-08-21 DIAGNOSIS — I739 Peripheral vascular disease, unspecified: Secondary | ICD-10-CM | POA: Insufficient documentation

## 2020-08-21 DIAGNOSIS — Z95828 Presence of other vascular implants and grafts: Secondary | ICD-10-CM | POA: Insufficient documentation

## 2020-08-21 NOTE — Progress Notes (Signed)
Left before being seen.

## 2020-11-09 ENCOUNTER — Telehealth: Payer: Self-pay

## 2020-11-09 NOTE — Telephone Encounter (Signed)
Talked with pt and told him per Dr Allena Katz we will not accept him in out office.

## 2021-09-07 IMAGING — CT CT CHEST LUNG CANCER SCREENING LOW DOSE W/O CM
1 series · 10 of 10 positions shown, 13 images · non-contrast
Comparison: 09/30/2018.

CLINICAL DATA: Former smoker, quit this year, 45 pack-year history.

EXAM:
CT CHEST WITHOUT CONTRAST LOW-DOSE FOR LUNG CANCER SCREENING
TECHNIQUE: Multidetector CT imaging of the chest was performed following the
standard protocol without IV contrast.

[ct lung segmentation data · axial · 0.70mm/px · z∈[-344,-344]mm · 10 of 350 frames shown]
[frame 1/350  mediastinal]
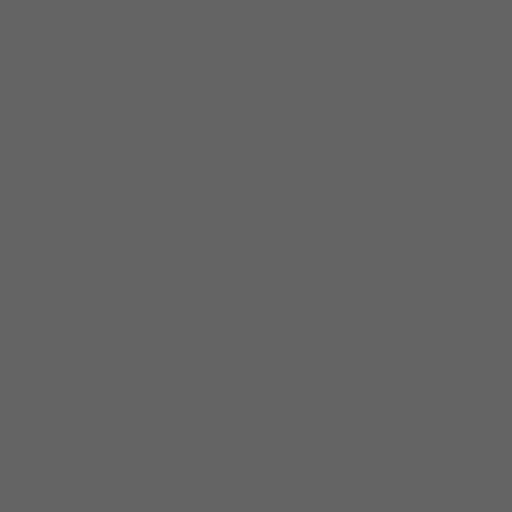
[frame 1/350  lung]
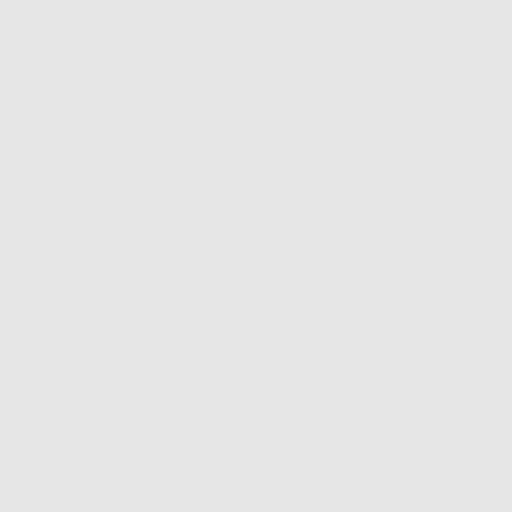
[frame 39/350  lung]
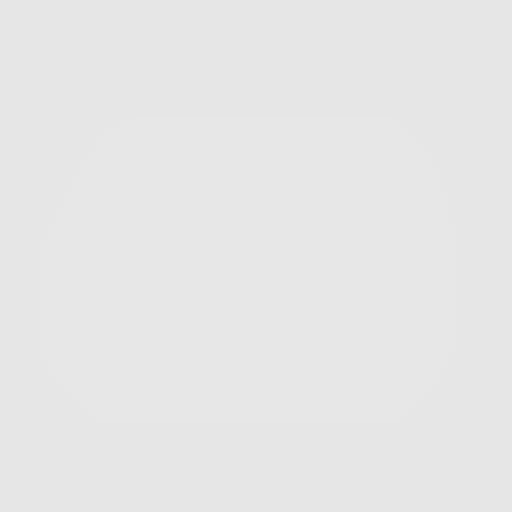
[frame 78/350  lung]
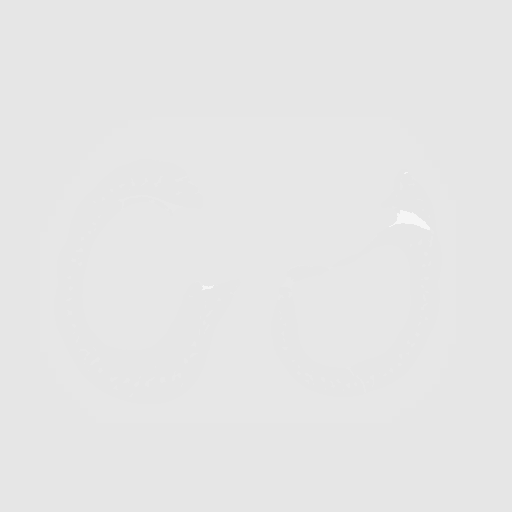
[frame 117/350  lung]
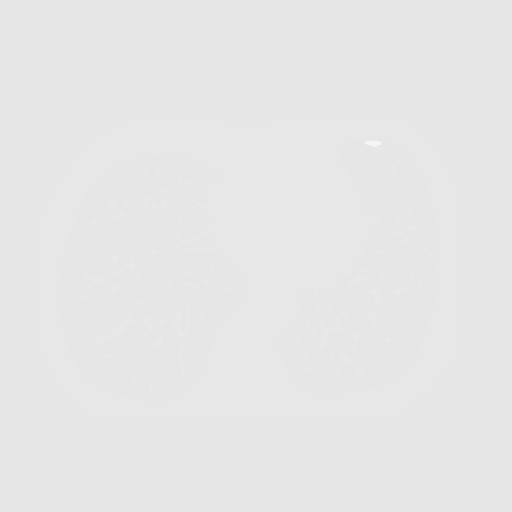
[frame 156/350  mediastinal]
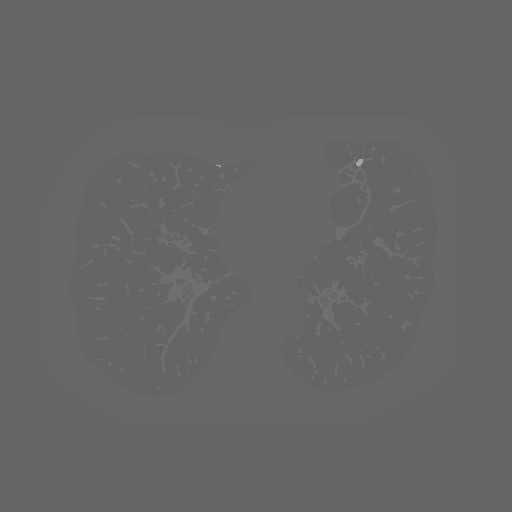
[frame 156/350  lung]
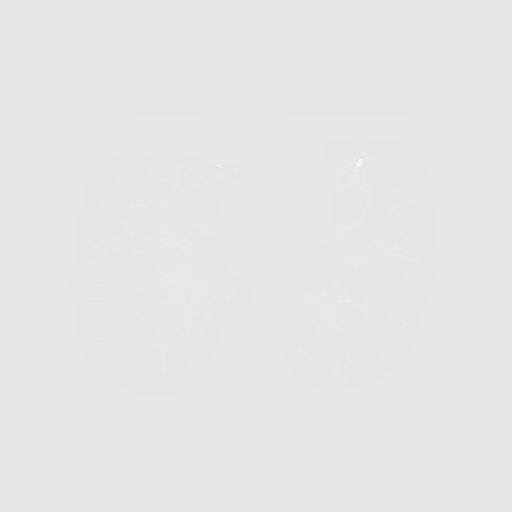
[frame 194/350  lung]
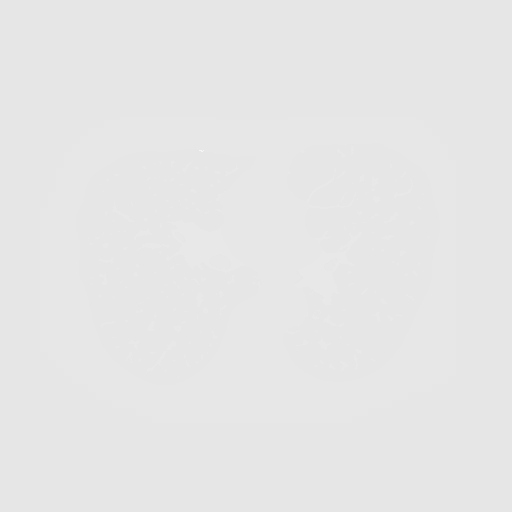
[frame 233/350  lung]
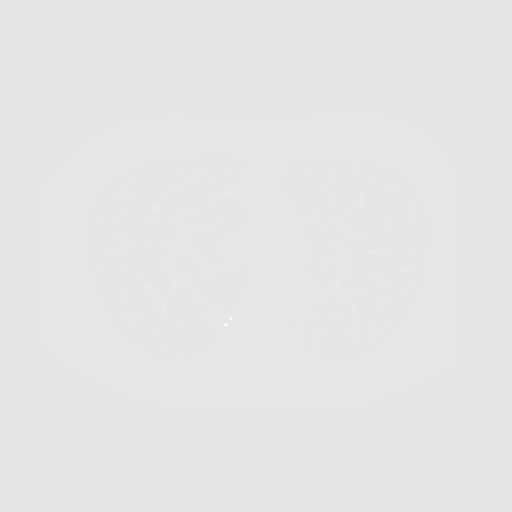
[frame 272/350  lung]
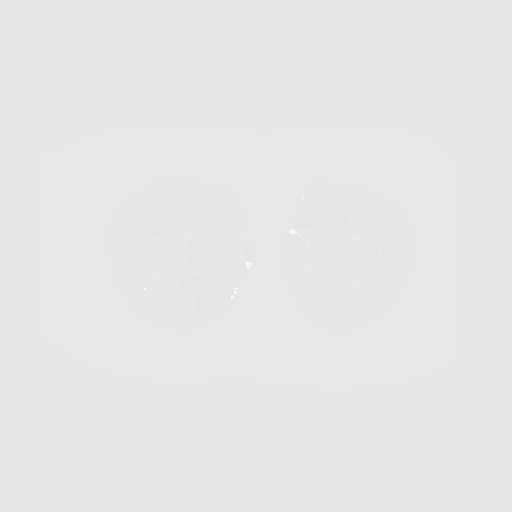
[frame 311/350  mediastinal]
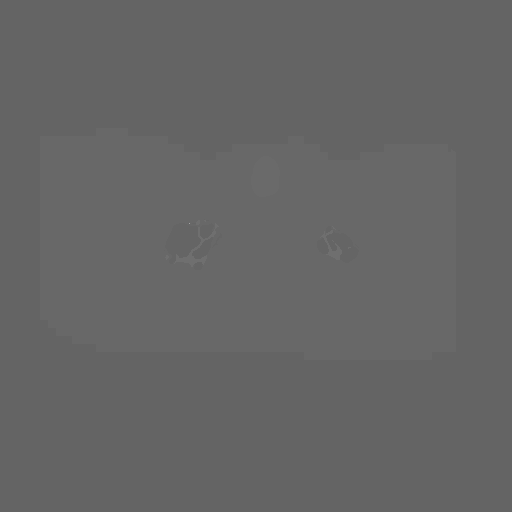
[frame 311/350  lung]
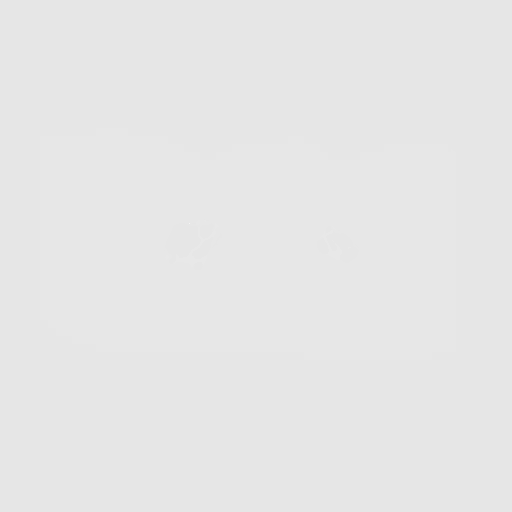
[frame 350/350  lung]
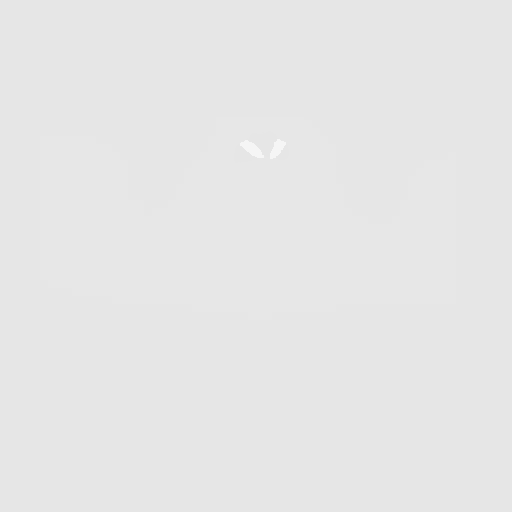

[10 of 10 positions shown; findings below may reference images not displayed]

FINDINGS: Cardiovascular: Atherosclerotic calcification of the aorta and
coronary arteries. Heart size normal. No pericardial effusion.

Mediastinum/Nodes: No pathologically enlarged mediastinal or
axillary lymph nodes. Hilar regions are difficult to evaluate
without IV contrast but appear grossly unremarkable. Esophagus is
grossly unremarkable.

Lungs/Pleura: Biapical pleuroparenchymal scarring. Bullous
paraseptal and centrilobular emphysema. Peribronchial thickening. No
suspicious pulmonary nodules. No pleural fluid. Airway is
unremarkable.

Upper Abdomen: Visualized portions of the liver, adrenal glands,
kidneys, spleen, pancreas, stomach and bowel are grossly
unremarkable. Cholecystectomy. No upper abdominal adenopathy.

Musculoskeletal: Mild degenerative changes in the spine. No
worrisome lytic or sclerotic lesions.
IMPRESSION: 1. Lung-RADS 1, negative. Continue annual screening with low-dose
chest CT without contrast in 12 months.
2. Aortic atherosclerosis ([id number]). Coronary artery
calcification.
3.  Emphysema ([id number]).

## 2021-09-09 ENCOUNTER — Ambulatory Visit
Admission: EM | Admit: 2021-09-09 | Discharge: 2021-09-09 | Disposition: A | Discharge status: Other Acute Inpatient Hospital | Payer: Medicaid Other

## 2021-09-09 ENCOUNTER — Emergency Department (HOSPITAL_COMMUNITY)
Admission: EM | Admit: 2021-09-09 | Discharge: 2021-09-09 | Disposition: A | Discharge status: Home / Self Care | Payer: Medicaid Other | Attending: Emergency Medicine | Admitting: Emergency Medicine

## 2021-09-09 ENCOUNTER — Other Ambulatory Visit: Payer: Self-pay

## 2021-09-09 DIAGNOSIS — I4892 Unspecified atrial flutter: Secondary | ICD-10-CM

## 2021-09-09 DIAGNOSIS — R002 Palpitations: Secondary | ICD-10-CM | POA: Diagnosis present

## 2021-09-09 DIAGNOSIS — Z7901 Long term (current) use of anticoagulants: Secondary | ICD-10-CM | POA: Diagnosis not present

## 2021-09-09 DIAGNOSIS — I483 Typical atrial flutter: Secondary | ICD-10-CM | POA: Diagnosis not present

## 2021-09-09 LAB — BASIC METABOLIC PANEL
Anion gap: 5 (ref 5–15)
BUN: 22 mg/dL (ref 8–23)
CO2: 28 mmol/L (ref 22–32)
Calcium: 8.6 mg/dL — ABNORMAL LOW (ref 8.9–10.3)
Chloride: 95 mmol/L — ABNORMAL LOW (ref 98–111)
Creatinine, Ser: 0.96 mg/dL (ref 0.61–1.24)
GFR, Estimated: >60 mL/min (ref >60)
Glucose, Bld: 127 mg/dL — ABNORMAL HIGH (ref 70–99)
Potassium: 4.5 mmol/L (ref 3.5–5.1)
Sodium: 128 mmol/L — ABNORMAL LOW (ref 135–145)

## 2021-09-09 LAB — CBC WITH DIFFERENTIAL/PLATELET
Abs Immature Granulocytes: 0.05 10*3/uL (ref 0.00–0.07)
Basophils Absolute: 0 10*3/uL (ref 0.0–0.1)
Basophils Relative: 0 %
Eosinophils Absolute: 0 10*3/uL (ref 0.0–0.5)
Eosinophils Relative: 0 %
HCT: 38.2 % — ABNORMAL LOW (ref 39.0–52.0)
Hemoglobin: 12.9 g/dL — ABNORMAL LOW (ref 13.0–17.0)
Immature Granulocytes: 0 %
Lymphocytes Relative: 11 %
Lymphs Abs: 1.2 10*3/uL (ref 0.7–4.0)
MCH: 31.2 pg (ref 26.0–34.0)
MCHC: 33.8 g/dL (ref 30.0–36.0)
MCV: 92.3 fL (ref 80.0–100.0)
Monocytes Absolute: 0.9 10*3/uL (ref 0.1–1.0)
Monocytes Relative: 8 %
Neutro Abs: 9.1 10*3/uL — ABNORMAL HIGH (ref 1.7–7.7)
Neutrophils Relative %: 81 %
Platelets: 175 10*3/uL (ref 150–400)
RBC: 4.14 MIL/uL — ABNORMAL LOW (ref 4.22–5.81)
RDW: 12.9 % (ref 11.5–15.5)
WBC: 11.3 10*3/uL — ABNORMAL HIGH (ref 4.0–10.5)
nRBC: 0 % (ref 0.0–0.2)

## 2021-09-09 LAB — MAGNESIUM: Magnesium: 2.1 mg/dL (ref 1.7–2.4)

## 2021-09-09 MED ORDER — METOPROLOL TARTRATE 50 MG PO TABS
50.0000 mg | ORAL_TABLET | Freq: Once | ORAL | Status: AC
  Administered 2021-09-09: 50 mg via ORAL
  Filled 2021-09-09: qty 1

## 2021-09-09 MED ORDER — ADENOSINE 6 MG/2ML IV SOLN
INTRAVENOUS | Status: AC
  Filled 2021-09-09: qty 2

## 2021-09-09 MED ORDER — ETOMIDATE 2 MG/ML IV SOLN
10.0000 mg | Freq: Once | INTRAVENOUS | Status: AC
  Administered 2021-09-09: 10 mg via INTRAVENOUS
  Filled 2021-09-09: qty 10

## 2021-09-09 MED ORDER — ADENOSINE 6 MG/2ML IV SOLN
6.0000 mg | Freq: Once | INTRAVENOUS | Status: AC
  Administered 2021-09-09: 6 mg via INTRAVENOUS

## 2021-09-09 MED ORDER — APIXABAN 5 MG PO TABS: 5.0000 mg | ORAL_TABLET | Freq: Two times a day (BID) | ORAL | 0 refills | Status: DC

## 2021-09-09 MED ORDER — METOPROLOL SUCCINATE ER 25 MG PO TB24: 25.0000 mg | ORAL_TABLET | Freq: Every day | ORAL | 0 refills | Status: DC

## 2021-09-09 MED ORDER — APIXABAN 5 MG PO TABS
5.0000 mg | ORAL_TABLET | Freq: Two times a day (BID) | ORAL | Status: DC
  Administered 2021-09-09: 5 mg via ORAL
  Filled 2021-09-09: qty 1

## 2021-09-09 MED ORDER — SODIUM CHLORIDE 0.9 % IV BOLUS
1000.0000 mL | Freq: Once | INTRAVENOUS | Status: AC
  Administered 2021-09-09: 1000 mL via INTRAVENOUS

## 2021-09-09 NOTE — ED Notes (Signed)
Electrical cardioversion with 120J performed at this time using Etomidate for sedation. Patient tolerated procedure well. Cardioversion successful and rhythm normal sinus at this time. See sedation charting.

## 2021-09-09 NOTE — ED Triage Notes (Signed)
Pt reports BP becoming elevated recently and is currently feeling light headed

## 2021-09-09 NOTE — ED Notes (Signed)
Patient is being discharged from the Urgent Care and sent to the Emergency Department via ems . Per Phil Dopp, patient is in need of higher level of care due to abnormal ekg . Patient is aware and verbalizes understanding of plan of care.  Vitals:   09/09/21 1555  BP: (!) 148/107  Pulse: (!) 155  Resp: 20  Temp: 98.3 F (36.8 C)  SpO2: 97%

## 2021-09-09 NOTE — ED Triage Notes (Signed)
Patient reports lightheadedness with HTN this am. Seen UC with EKG done which shows rate of 155.Denies chest pain, SHOB.

## 2021-09-09 NOTE — Discharge Instructions (Addendum)
-   The EKG today shows atrial flutter

## 2021-09-09 NOTE — ED Provider Notes (Addendum)
[facility] EMERGENCY DEPARTMENT Provider Note   CSN: [id number] Arrival date & time: 09/09/21  1643     History  Chief Complaint  Patient presents with   Irregular Heart Beat    Brady Bell is a 66 y.o. male.  Patient presents to ER chief complaint of palpitations shortness of breath.  Symptoms started earlier today.  He states he was fine this morning and yesterday but early today noticed sudden onset rapid heart rate palpitations which has not improved.  He went to urgent care was found to be in a flutter with rapid rate and sent to the ER.  Patient otherwise denies any fevers or cough denies headache chest pain or abdominal pain.  She had a flutter in the past but states that it went away and has not had problems for a long time now.       Home Medications Prior to Admission medications   Medication Sig Start Date End Date Taking? Authorizing Provider  apixaban (ELIQUIS) 5 MG TABS tablet Take 1 tablet (5 mg total) by mouth 2 (two) times daily. 09/09/21 10/09/21 Yes Cheryll Cockayne, MD  metoprolol succinate (TOPROL-XL) 25 MG 24 hr tablet Take 1 tablet (25 mg total) by mouth daily. 09/09/21 10/09/21 Yes Cheryll Cockayne, MD  amphetamine-dextroamphetamine (ADDERALL) 10 MG tablet Take 10 mg by mouth daily. 12/16/19   [provider]  cycloSPORINE (RESTASIS) 0.05 % ophthalmic emulsion Place 1 drop into both eyes 2 (two) times daily.    [provider]  diltiazem (CARDIZEM CD) 180 MG 24 hr capsule Take 1 capsule (180 mg total) by mouth daily. 04/17/18   Kari Baars, MD  divalproex (DEPAKOTE ER) 500 MG 24 hr tablet Take 500 mg by mouth daily. 08/05/21   [provider]  eletriptan (RELPAX) 40 MG tablet Take 40 mg by mouth every 2 (two) hours as needed for migraine or headache. May repeat in 2 hours if headache persists or recurs.    [provider]  gabapentin (NEURONTIN) 300 MG capsule Take 300 mg by mouth daily.     [provider]  Multiple  Vitamin (MULTIVITAMIN WITH MINERALS) TABS tablet Take 1 tablet by mouth daily.    [provider]  oxyCODONE (OXY IR/ROXICODONE) 5 MG immediate release tablet Take 5 mg by mouth 2 (two) times daily. 07/27/20   [provider]  pantoprazole (PROTONIX) 40 MG tablet Take 1 tablet (40 mg total) by mouth daily before breakfast. 08/03/17   Kari Baars, MD  rosuvastatin (CRESTOR) 10 MG tablet Take 10 mg by mouth daily. 08/13/20   [provider]  SUMAtriptan (IMITREX) 100 MG tablet Take 1 tablet (100 mg total) by mouth every 2 (two) hours as needed for migraine. May repeat in 2 hours if headache persists or recurs. 12/08/16   Levert Feinstein, MD  tamsulosin (FLOMAX) 0.4 MG CAPS capsule Take 0.4 mg by mouth daily. 06/06/19   [provider]  thiamine 100 MG tablet Take 1 tablet (100 mg total) by mouth daily. 04/17/18   Kari Baars, MD  traZODone (DESYREL) 50 MG tablet Take 50 mg by mouth at bedtime.    [provider]      Allergies    Patient has no known allergies.    Review of Systems   Review of Systems  Constitutional:  Negative for fever.  HENT:  Negative for ear pain and sore throat.   Eyes:  Negative for pain.  Respiratory:  Negative for cough.  Cardiovascular:  Positive for palpitations. Negative for chest pain.  Gastrointestinal:  Negative for abdominal pain.  Genitourinary:  Negative for flank pain.  Musculoskeletal:  Negative for back pain.  Skin:  Negative for color change and rash.  Neurological:  Negative for syncope.  All other systems reviewed and are negative.   Physical Exam Updated Vital Signs BP (!) 140/100   Pulse 63   Temp 98.4 F (36.9 C)   Resp 17   Ht 5\' 10"  (1.778 m)   Wt 62.1 kg   SpO2 99%   BMI 19.64 kg/m  Physical Exam Constitutional:      Appearance: He is well-developed.  HENT:     Head: Normocephalic.     Nose: Nose normal.  Eyes:     Extraocular Movements: Extraocular movements intact.  Cardiovascular:      Rate and Rhythm: Regular rhythm. Tachycardia present.  Pulmonary:     Effort: Pulmonary effort is normal.  Skin:    Coloration: Skin is not jaundiced.  Neurological:     General: No focal deficit present.     Mental Status: He is alert and oriented to person, place, and time. Mental status is at baseline.     Cranial Nerves: No cranial nerve deficit.     Motor: No weakness.     ED Results / Procedures / Treatments   Labs (all labs ordered are listed, but only abnormal results are displayed) Labs Reviewed  CBC WITH DIFFERENTIAL/PLATELET - Abnormal; Notable for the following components:      Result Value   WBC 11.3 (*)    RBC 4.14 (*)    Hemoglobin 12.9 (*)    HCT 38.2 (*)    Neutro Abs 9.1 (*)    All other components within normal limits  BASIC METABOLIC PANEL - Abnormal; Notable for the following components:   Sodium 128 (*)    Chloride 95 (*)    Glucose, Bld 127 (*)    Calcium 8.6 (*)    All other components within normal limits  MAGNESIUM    EKG None  Radiology No results found.  Procedures .Cardioversion  Date/Time: 09/09/2021 6:40 PM  Performed by: 11/09/2021, MD Authorized by: Cheryll Cockayne, MD   Consent:    Consent obtained:  Verbal   Consent given by:  Patient Pre-procedure details:    Cardioversion basis:  Emergent   Rhythm:  Atrial flutter Comments:     Patient given etomidate 10 mg IV with good sedation achieved.  Defibrillated with 150 J.  Tolerated well.  No desaturations noted.  Converted to sinus rhythm.  10 minutes later he had another run of atrial flutter with rapid ventricular rate about 160 bpm this lasted 2 to 3 minutes and then spontaneously converted back to sinus rhythm.  Subsequently he was given 50 mg of metoprolol and subsequently no additional atrial flutter rhythms noted.    .Critical Care  Performed by: Cheryll Cockayne, MD Authorized by: Cheryll Cockayne, MD   Critical care provider statement:    Critical care time  (minutes):  40   Critical care time was exclusive of:  Separately billable procedures and treating other patients and teaching time   Critical care was necessary to treat or prevent imminent or life-threatening deterioration of the following conditions:  Cardiac failure     Medications Ordered in ED Medications  apixaban (ELIQUIS) tablet 5 mg (5 mg Oral Given 09/09/21 1713)  adenosine (ADENOCARD) 6 MG/2ML injection 6 mg (6 mg  Intravenous Given 09/09/21 1658)  etomidate (AMIDATE) injection 10 mg (10 mg Intravenous Given 09/09/21 1718)  metoprolol tartrate (LOPRESSOR) tablet 50 mg (50 mg Oral Given 09/09/21 1737)  sodium chloride 0.9 % bolus 1,000 mL (1,000 mLs Intravenous New Bag/Given 09/09/21 1817)    ED Course/ Medical Decision Making/ A&P                           Medical Decision Making Amount and/or Complexity of Data Reviewed Labs: ordered.  Risk Prescription drug management.   Review of external records shows prior visits to urgent care today.  Cardiac monitoring shows no complex rapid rhythm possible SVT versus atrial flutter was 2 1 conduction.  Patient given adenosine 6 mg subsequently flutter waves noted.  Risks and benefits of electrical cardioversion discussed with patient who was ultimately agreeable to the plan.  Given Eliquis 5 mg p.o. prior, given etomidate 10 mg IV, cardioverted with 150 J.  Patient tolerated procedure well no desaturations noted.  Subsequently rhythm converted to sinus rhythm.  He had a return to atrial flutter with rapid rate again for about 2 to 3 minutes but then stabilized down to a sinus rhythm again.  He was given metoprolol oral.  Continues to do well here in the ER no additional atrial flutter episodes noted.  Discharged home in stable condition advised outpatient follow-up with A-fib clinic within 1 to 2 weeks.  Advised immediate return for worsening symptoms.  Advise continued Eliquis at home.  Addendum: His pharmacy called back today,  stating the patient was already on propanolol.  This does not appear to come up on his med list here in the hospital.  Pharmacist states that he has been filling this medication recently.  Given this information I will hold off on adding metoprolol but advised him to follow-up in cardiology clinic this week.   Final Clinical Impression(s) / ED Diagnoses Final diagnoses:  Atrial flutter with rapid ventricular response (HCC)    Rx / DC Orders ED Discharge Orders          Ordered    Amb referral to AFIB Clinic        09/09/21 1834    apixaban (ELIQUIS) 5 MG TABS tablet  2 times daily        09/09/21 1838    metoprolol succinate (TOPROL-XL) 25 MG 24 hr tablet  Daily        09/09/21 1838              Cheryll CockayneHong, Berenise Hunton S, MD 09/09/21 1839    Cheryll CockayneHong, Kaston Faughn S, MD 09/09/21 1841    Cheryll CockayneHong, Tyrell Seifer S, MD 09/09/21 [phone]

## 2021-09-09 NOTE — Discharge Instructions (Addendum)
Take new medications as written.  Eliquis is a blood thinning medicine.  Do not take any additional aspirin or NSAIDs such as Motrin ibuprofen Aleve or Relafen while taking this medication.  Follow-up in atrial fibrillation clinic within the next 1 to 2 weeks.  However if you have recurrent symptoms difficulty breathing pain or any additional concerns return immediately back to the ER.

## 2021-09-09 NOTE — ED Provider Notes (Signed)
[facility]    CSN: [id number] Arrival date & time: 09/09/21  1550      History   Chief Complaint Chief Complaint  Patient presents with   Hypertension    HPI Leim FabryKenneth L Kincheloe is a 66 y.o. male.   Patient presents with elevated blood pressure and heart rate that he first noticed this afternoon after taking his medication.  He denies chest pain, shortness of breath, palpitations, however does endorse some lightheadedness with position changes.  Medical history significant for atrial flutter, alcohol abuse, COPD, hypertension.  Reports he thinks he did take diltiazem 180 mg today, but is not 100% sure.     Past Medical History:  Diagnosis Date   Chronic ankle pain    Chronic knee pain    COPD (chronic obstructive pulmonary disease) (HCC) 09/30/2018   on CT chest   GERD (gastroesophageal reflux disease)    Hepatitis    Hep A in 1980's   History of atrial flutter    Documented April/May 2019 in the setting of pancreatitis   Hypoglycemia    Migraines    Pancreatitis    Peripheral vascular disease (HCC)    Recurrent sinus infections    Seizures (HCC)    Shingles     Patient Active Problem List   Diagnosis Date Noted   Chronic alcohol abuse 12/16/2018   Leukocytosis 12/16/2018   Alcohol withdrawal (HCC) 10/02/2018   COPD (chronic obstructive pulmonary disease) (HCC) 09/30/2018   Pancreatitis, acute 04/12/2018   Tobacco abuse 04/12/2018   GERD (gastroesophageal reflux disease) 04/12/2018   Alcohol abuse 04/12/2018   Hx of seizure disorder 04/12/2018   History of atrial flutter 04/12/2018   HTN (hypertension) 04/12/2018   Gallstone pancreatitis    Erythrocytosis 09/10/2017   Calculus of gallbladder with cholecystitis without biliary obstruction    Atrial flutter (HCC) 07/29/2017   Acute gallstone pancreatitis 07/22/2017   Acute alcoholic pancreatitis 07/22/2017   Hypertensive urgency 07/22/2017   Acute pancreatitis 07/22/2017   Accelerated  hypertension    Tobacco abuse counseling    PAD (peripheral artery disease) (HCC) 04/28/2017   Chronic migraine 02/11/2017   New daily persistent headache 12/08/2016   Peripheral vascular disease (HCC) 12/08/2016   Pain in joint, lower leg 02/10/2014   Numbness and tingling of left leg 12/16/2013   Cramps of left lower extremity-Leg 12/16/2013   Aftercare following surgery of the circulatory system, NEC 12/16/2013   PVD (peripheral vascular disease) with claudication (HCC) 05/07/2012   Pain in limb 03/26/2012   Pain, lower leg 12/26/2011   Atherosclerosis of native arteries of extremity with intermittent claudication (HCC) 09/26/2011   CLOSED FRACTURE OF UPPER END OF TIBIA 02/27/2010   TEAR MEDIAL MENISCUS 02/27/2010    Past Surgical History:  Procedure Laterality Date   ABDOMINAL AORTOGRAM N/A 04/23/2017   Procedure: ABDOMINAL AORTOGRAM;  Surgeon: Fransisco Hertzhen, Brian L, MD;  [facility];  Service: Cardiovascular;  Laterality: N/A;   CHOLECYSTECTOMY N/A 09/23/2017   Procedure: LAPAROSCOPIC CHOLECYSTECTOMY;  Surgeon: Franky MachoJenkins, Mark, MD;  Location: [facility];  Service: General;  Laterality: N/A;   ENDARTERECTOMY FEMORAL Left 04/28/2017   Procedure: ENDARTERECTOMY LEFT ILIO-FEMORAL ARTERY;  Surgeon: Fransisco Hertzhen, Brian L, MD;  [facility];  Service: Vascular;  Laterality: Left;   FASCIOTOMY  09/06/2011   Procedure: FASCIOTOMY;  Surgeon: Fransisco HertzBrian L Chen, MD;  [facility];  Service: Vascular;  Laterality: Left;   FEMORAL-TIBIAL BYPASS GRAFT  09/06/2011   Procedure: BYPASS GRAFT FEMORAL-TIBIAL ARTERY;  Surgeon: Fransisco Hertz, MD;  Location[facility];  Service: Vascular;  Laterality: Left;   KNEE CARTILAGE SURGERY Left    LOWER EXTREMITY ANGIOGRAM N/A 09/05/2011   Procedure: LOWER EXTREMITY ANGIOGRAM;  Surgeon: Sherren Kerns, MD;  Location[facility] CATH LAB;  Service: Cardiovascular;  Laterality: N/A;   LOWER EXTREMITY ANGIOGRAM Left 04/15/2012   Procedure: LOWER EXTREMITY ANGIOGRAM;  Surgeon: Fransisco Hertz, MD;  Location[facility] C[facility] LAB;  Service: Cardiovascular;  Laterality: Left;   LOWER EXTREMITY ANGIOGRAPHY Bilateral 04/23/2017   Procedure: Lower Extremity Angiography;  Surgeon: Fransisco Hertz, MD;  Location: [facility] INVASIVE CV LAB;  Service: Cardiovascular;  Laterality: Bilateral;   PATCH ANGIOPLASTY Left 04/28/2017   Procedure: ILIO-FEMORAL ARTERY PATCH ANGIOPLASTY USING Livia Snellen BIOLOGIC PATCH;  Surgeon: Fransisco Hertz, MD;  Location: [facility] OR;  Service: Vascular;  Laterality: Left;   PR VEIN BYPASS GRAFT,AORTO-FEM-POP  09/06/11   Left    TOOTH EXTRACTION  June-July-Aug. 2015   Several        Home Medications    Prior to Admission medications   Medication Sig Start Date End Date Taking? Authorizing Provider  amphetamine-dextroamphetamine (ADDERALL) 10 MG tablet Take 10 mg by mouth daily. 12/16/19   [provider]  aspirin EC 81 MG tablet Take 81 mg by mouth daily.    [provider]  cycloSPORINE (RESTASIS) 0.05 % ophthalmic emulsion Place 1 drop into both eyes 2 (two) times daily.    [provider]  diltiazem (CARDIZEM CD) 180 MG 24 hr capsule Take 1 capsule (180 mg total) by mouth daily. 04/17/18   Kari Baars, MD  divalproex (DEPAKOTE ER) 500 MG 24 hr tablet Take 500 mg by mouth daily. 08/05/21   [provider]  eletriptan (RELPAX) 40 MG tablet Take 40 mg by mouth every 2 (two) hours as needed for migraine or headache. May repeat in 2 hours if headache persists or recurs.    [provider]  gabapentin (NEURONTIN) 300 MG capsule Take 300 mg by mouth daily.     [provider]  Multiple Vitamin (MULTIVITAMIN WITH MINERALS) TABS tablet Take 1 tablet by mouth daily.    [provider]  nabumetone (RELAFEN) 750 MG tablet Take 750 mg by mouth 2 (two) times daily. 07/27/20   [provider]  oxyCODONE (OXY IR/ROXICODONE) 5 MG immediate release tablet Take 5 mg by mouth 2 (two) times daily. 07/27/20   [provider]   pantoprazole (PROTONIX) 40 MG tablet Take 1 tablet (40 mg total) by mouth daily before breakfast. 08/03/17   Kari Baars, MD  rosuvastatin (CRESTOR) 10 MG tablet Take 10 mg by mouth daily. 08/13/20   [provider]  SUMAtriptan (IMITREX) 100 MG tablet Take 1 tablet (100 mg total) by mouth every 2 (two) hours as needed for migraine. May repeat in 2 hours if headache persists or recurs. 12/08/16   Levert Feinstein, MD  tamsulosin (FLOMAX) 0.4 MG CAPS capsule Take 0.4 mg by mouth daily. 06/06/19   [provider]  thiamine 100 MG tablet Take 1 tablet (100 mg total) by mouth daily. 04/17/18   Kari Baars, MD  traZODone (DESYREL) 50 MG tablet Take 50 mg by mouth at bedtime.    [provider]    Family History Family History  Problem Relation Age of Onset   Alcohol abuse Father    Kidney failure Father    Other Mother        "age"   Diabetes Other  Social History Social History   Tobacco Use   Smoking status: Former    Packs/day: 1.00    Years: 45.00    Total pack years: 45.00    Types: Cigarettes    Quit date: 04/2019    Years since quitting: 2.4   Smokeless tobacco: Never  Vaping Use   Vaping Use: Never used  Substance Use Topics   Alcohol use: Yes    Alcohol/week: 3.0 standard drinks of alcohol    Types: 3 Standard drinks or equivalent per week    Comment: Few drinks per day   Drug use: Yes    Types: Marijuana    Comment: occasionally     Allergies   Patient has no known allergies.   Review of Systems Review of Systems Per HPI  Physical Exam Triage Vital Signs ED Triage Vitals [09/09/21 1555]  Enc Vitals Group     BP (!) 148/107     Pulse Rate (!) 155     Resp 20     Temp 98.3 F (36.8 C)     Temp src      SpO2 97 %     Weight      Height      Head Circumference      Peak Flow      Pain Score      Pain Loc      Pain Edu?      Excl. in GC?    No data found.  Updated Vital Signs BP (!) 148/107   Pulse (!) 155   Temp  98.3 F (36.8 C)   Resp 20   SpO2 97%   Visual Acuity Right Eye Distance:   Left Eye Distance:   Bilateral Distance:    Right Eye Near:   Left Eye Near:    Bilateral Near:     Physical Exam Vitals and nursing note reviewed.  Constitutional:      General: He is not in acute distress.    Appearance: Normal appearance. He is not toxic-appearing.  Cardiovascular:     Rate and Rhythm: Tachycardia present.  Pulmonary:     Effort: Pulmonary effort is normal. No respiratory distress.     Breath sounds: Normal breath sounds. No wheezing, rhonchi or rales.  Musculoskeletal:     Cervical back: Normal range of motion.  Skin:    General: Skin is warm and dry.     Coloration: Skin is not jaundiced or pale.     Findings: No erythema.  Neurological:     Mental Status: He is alert and oriented to person, place, and time.  Psychiatric:        Mood and Affect: Mood is anxious.      UC Treatments / Results  Labs (all labs ordered are listed, but only abnormal results are displayed) Labs Reviewed - No data to display  EKG   Radiology No results found.  Procedures Procedures (including critical care time)  Medications Ordered in UC Medications - No data to display  Initial Impression / Assessment and Plan / UC Course  I have reviewed the triage vital signs and the nursing notes.  Pertinent labs & imaging results that were available during my care of the patient were reviewed by me and considered in my medical decision making (see chart for details).     EKG today shows atrial flutter; given dizziness, I recommended transportation via EMS.  Patient is in agreement with this plan.  Patient transported via EMS to  Emergency Room for further evaluation and management.  Final Clinical Impressions(s) / UC Diagnoses   Final diagnoses:  Typical atrial flutter [facility])     Discharge Instructions      - The EKG today shows atrial flutter     ED Prescriptions   None     PDMP not reviewed this encounter.   Valentino Nose, NP 09/09/21 1621

## 2021-09-30 ENCOUNTER — Encounter (HOSPITAL_COMMUNITY): Payer: Self-pay | Admitting: Physician Assistant

## 2021-09-30 ENCOUNTER — Ambulatory Visit (HOSPITAL_COMMUNITY)
Admission: RE | Admit: 2021-09-30 | Discharge: 2021-09-30 | Disposition: A | Discharge status: Home / Self Care | Payer: Medicaid Other | Source: Ambulatory Visit | Attending: Physician Assistant | Admitting: Physician Assistant

## 2021-09-30 VITALS — BP 128/70 | HR 67 | Ht 70.0 in | Wt 122.2 lb

## 2021-09-30 DIAGNOSIS — G40909 Epilepsy, unspecified, not intractable, without status epilepticus: Secondary | ICD-10-CM | POA: Diagnosis not present

## 2021-09-30 DIAGNOSIS — F17200 Nicotine dependence, unspecified, uncomplicated: Secondary | ICD-10-CM | POA: Diagnosis not present

## 2021-09-30 DIAGNOSIS — J449 Chronic obstructive pulmonary disease, unspecified: Secondary | ICD-10-CM | POA: Diagnosis not present

## 2021-09-30 DIAGNOSIS — I739 Peripheral vascular disease, unspecified: Secondary | ICD-10-CM | POA: Diagnosis not present

## 2021-09-30 DIAGNOSIS — Z7901 Long term (current) use of anticoagulants: Secondary | ICD-10-CM | POA: Insufficient documentation

## 2021-09-30 DIAGNOSIS — I483 Typical atrial flutter: Secondary | ICD-10-CM | POA: Diagnosis not present

## 2021-09-30 DIAGNOSIS — I4892 Unspecified atrial flutter: Secondary | ICD-10-CM | POA: Diagnosis present

## 2021-09-30 DIAGNOSIS — F1010 Alcohol abuse, uncomplicated: Secondary | ICD-10-CM | POA: Diagnosis not present

## 2021-09-30 MED ORDER — APIXABAN 5 MG PO TABS: 5.0000 mg | ORAL_TABLET | Freq: Two times a day (BID) | ORAL | 0 refills | Status: AC

## 2021-09-30 NOTE — Progress Notes (Signed)
Primary Care Physician: Oval Linsey, MD (Inactive) Primary Cardiologist: Dr Diona Browner (remotely) Primary Electrophysiologist: none Referring Physician: [facility]   Brady Bell is a 66 y.o. male with a history of PAD, tobacco and alcohol abuse, COPD, seizure disorder, atrial flutter who presents for consultation in the [facility].  The patient was initially diagnosed with atrial flutter in 2019 in the setting of pancreatitis. Patient presented to urgent care 09/09/21 with elevated heart rates and lightheadedness. ECG showed rapid atrial flutter and he was sent to ED for evaluation. He underwent DCCV at that time and was started on Eliquis for a CHADS2VASC score of 1. Today, patient reports that he is feeling well with no further heart racing. There was no specific trigger that he could identify. No bleeding issues on anticoagulation. He stopped drinking and smoking 3 years ago.  Today, he denies symptoms of palpitations, chest pain, shortness of breath, orthopnea, PND, lower extremity edema, dizziness, presyncope, syncope, snoring, daytime somnolence, bleeding, or neurologic sequela. The patient is tolerating medications without difficulties and is otherwise without complaint today.    Atrial Fibrillation Risk Factors:  he does not have symptoms or diagnosis of sleep apnea. he does not have a history of rheumatic fever. he does have a history of alcohol use.   he has a BMI of Body mass index is 17.53 kg/m.Marland Kitchen Filed Weights   09/30/21 0957  Weight: 55.4 kg    Family History  Problem Relation Age of Onset   Alcohol abuse Father    Kidney failure Father    Other Mother        "age"   Diabetes Other      Atrial Fibrillation Management history:  Previous antiarrhythmic drugs: none Previous cardioversions: 09/09/21 Previous ablations: none CHADS2VASC score: 1 Anticoagulation history: Eliquis   Past Medical History:  Diagnosis Date    Chronic ankle pain    Chronic knee pain    COPD (chronic obstructive pulmonary disease) (HCC) 09/30/2018   on CT chest   GERD (gastroesophageal reflux disease)    Hepatitis    Hep A in 1980's   History of atrial flutter    Documented April/May 2019 in the setting of pancreatitis   Hypoglycemia    Migraines    Pancreatitis    Peripheral vascular disease (HCC)    Recurrent sinus infections    Seizures (HCC)    Shingles    Past Surgical History:  Procedure Laterality Date   ABDOMINAL AORTOGRAM N/A 04/23/2017   Procedure: ABDOMINAL AORTOGRAM;  Surgeon: Fransisco Hertz, MD;  Location: [facility] INVASIVE CV LAB;  Service: Cardiovascular;  Laterality: N/A;   CHOLECYSTECTOMY N/A 09/23/2017   Procedure: LAPAROSCOPIC CHOLECYSTECTOMY;  Surgeon: Franky Macho, MD;  Location: [facility] ORS;  Service: General;  Laterality: N/A;   ENDARTERECTOMY FEMORAL Left 04/28/2017   Procedure: ENDARTERECTOMY LEFT ILIO-FEMORAL ARTERY;  Surgeon: Fransisco Hertz, MD;  Location[facility] OR;  Service: Vascular;  Laterality: Left;   FASCIOTOMY  09/06/2011   Procedure: FASCIOTOMY;  Surgeon: Fransisco Hertz, MD;  Location[facility] OR;  Service: Vascular;  Laterality: Left;   FEMORAL-TIBIAL BYPASS GRAFT  09/06/2011   Procedure: BYPASS GRAFT FEMORAL-TIBIAL ARTERY;  Surgeon: Fransisco Hertz, MD;  Location: [facility] OR;  Service: Vascular;  Laterality: Left;   KNEE CARTILAGE SURGERY Left    LOWER EXTREMITY ANGIOGRAM N/A 09/05/2011   Procedure: LOWER EXTREMITY ANGIOGRAM;  Surgeon: Sherren Kerns, MD;  Location: [facility] CATH LAB;  Service: Cardiovascular;  Laterality: N/A;  LOWER EXTREMITY ANGIOGRAM Left 04/15/2012   Procedure: LOWER EXTREMITY ANGIOGRAM;  Surgeon: Fransisco Hertz, MD;  Location: [facility] CATH LAB;  Service: Cardiovascular;  Laterality: Left;   LOWER EXTREMITY ANGIOGRAPHY Bilateral 04/23/2017   Procedure: Lower Extremity Angiography;  Surgeon: Fransisco Hertz, MD;  Location: [facility] INVASIVE CV LAB;  Service: Cardiovascular;  Laterality: Bilateral;   PATCH ANGIOPLASTY Left  04/28/2017   Procedure: ILIO-FEMORAL ARTERY PATCH ANGIOPLASTY USING XENOSURE BIOLOGIC PATCH;  Surgeon: Fransisco Hertz, MD;  Location: [facility] OR;  Service: Vascular;  Laterality: Left;   PR VEIN BYPASS GRAFT,AORTO-FEM-POP  09/06/11   Left    TOOTH EXTRACTION  June-July-Aug. 2015   Several     Current Outpatient Medications  Medication Sig Dispense Refill   amphetamine-dextroamphetamine (ADDERALL) 20 MG tablet Take 10 mg by mouth 2 (two) times daily.     apixaban (ELIQUIS) 5 MG TABS tablet Take 1 tablet (5 mg total) by mouth 2 (two) times daily. 60 tablet 0   cycloSPORINE (RESTASIS) 0.05 % ophthalmic emulsion Place 1 drop into both eyes 2 (two) times daily.     divalproex (DEPAKOTE ER) 500 MG 24 hr tablet Take 500 mg by mouth daily.     eletriptan (RELPAX) 40 MG tablet Take 40 mg by mouth every 2 (two) hours as needed for migraine or headache. May repeat in 2 hours if headache persists or recurs.     gabapentin (NEURONTIN) 300 MG capsule Take 300 mg by mouth daily.      Multiple Vitamin (MULTIVITAMIN WITH MINERALS) TABS tablet Take 1 tablet by mouth daily.     oxyCODONE (OXY IR/ROXICODONE) 5 MG immediate release tablet Take 5 mg by mouth 2 (two) times daily.     pantoprazole (PROTONIX) 40 MG tablet Take 1 tablet (40 mg total) by mouth daily before breakfast. 30 tablet 12   rosuvastatin (CRESTOR) 10 MG tablet Take 10 mg by mouth daily.     sildenafil (VIAGRA) 50 MG tablet Take 50 mg by mouth daily.     SUMAtriptan (IMITREX) 100 MG tablet Take 1 tablet (100 mg total) by mouth every 2 (two) hours as needed for migraine. May repeat in 2 hours if headache persists or recurs. 12 tablet 11   traZODone (DESYREL) 50 MG tablet Take 50 mg by mouth at bedtime.     No current facility-administered medications for this encounter.    No Known Allergies  Social History   Socioeconomic History   Marital status: Single    Spouse name: Not on file   Number of children: 0   Years of education: some college    Highest education level: Not on file  Occupational History   Occupation: Disabled  Tobacco Use   Smoking status: Former    Packs/day: 1.00    Years: 45.00    Total pack years: 45.00    Types: Cigarettes    Quit date: 04/2019    Years since quitting: 2.5   Smokeless tobacco: Never   Tobacco comments:    Former smoke 09/30/21  Vaping Use   Vaping Use: Never used  Substance and Sexual Activity   Alcohol use: Not Currently    Alcohol/week: 3.0 standard drinks of alcohol    Types: 3 Standard drinks or equivalent per week    Comment: stop drinking   Drug use: Yes    Types: Marijuana    Comment: 2-3 times a week 09/30/21   Sexual activity: Yes  Other Topics Concern   Not on file  Social  History Narrative   Lives alone.   Left-handed.   2-3 cups caffeine per day.   Social Determinants of Health   Financial Resource Strain: Not on file  Food Insecurity: Not on file  Transportation Needs: Not on file  Physical Activity: Not on file  Stress: Not on file  Social Connections: Not on file  Intimate Partner Violence: Not on file     ROS- All systems are reviewed and negative except as per the HPI above.  Physical Exam: Vitals:   09/30/21 0957  BP: 128/70  Pulse: 67  Weight: 55.4 kg  Height: 5\' 10"  (1.778 m)    GEN- The patient is a well appearing male, alert and oriented x 3 today.   Head- normocephalic, atraumatic Eyes-  Sclera clear, conjunctiva pink Ears- hearing intact Oropharynx- clear Neck- supple  Lungs- Clear to ausculation bilaterally, normal work of breathing Heart- Regular rate and rhythm, no murmurs, rubs or gallops  GI- soft, NT, ND, + BS Extremities- no clubbing, cyanosis, or edema MS- no significant deformity or atrophy Skin- no rash or lesion Psych- euthymic mood, full affect Neuro- strength and sensation are intact  Wt Readings from Last 3 Encounters:  09/30/21 55.4 kg  09/09/21 62.1 kg  08/21/20 62.1 kg    EKG today demonstrates  SR,  PAC Vent. rate 67 BPM PR interval 126 ms QRS duration 104 ms QT/QTcB 364/384 ms  Echo 07/31/17 demonstrated  - Left ventricle: The cavity size was normal. Wall thickness was    normal. Systolic function was normal. The estimated ejection    fraction was in the range of 60% to 65%. Wall motion was normal;    there were no regional wall motion abnormalities. Left    ventricular diastolic function parameters were normal.   Impressions:  - Normal study.   Epic records are reviewed at length today  CHA2DS2-VASc Score = 1  The patient's score is based upon: CHF History: 0 HTN History: 0 Diabetes History: 0 Stroke History: 0 Vascular Disease History: 1 Age Score: 0 Gender Score: 0       ASSESSMENT AND PLAN: 1. Atrial flutter The patient's CHA2DS2-VASc score is 1, indicating a 0.6% annual risk of stroke.   General education about atrial flutter provided and questions answered. We also discussed his stroke risk and the risks and benefits of anticoagulation. Continue Eliquis 5 mg BID for 4 weeks post DCCV, then discontinue with low CV score. Continue diltiazem 180 mg daily Patient is not currently taking BB. We discussed checking thyroid function, he deferred for now since he is having lab work with his PCP in two weeks. We also discussed ablation for atrial flutter. Patient would consider if he has quick return of his arrhythmia.   2. PVD S/p left common femoral to AT bypass S/p left iliofemoral endarterectomy   Will refer to reestablish care with cardiology in [place].    Garrison PA-C [facility] [address] [place], [place] [phone] 09/30/2021 10:05 AM

## 2021-11-12 ENCOUNTER — Ambulatory Visit: Payer: Medicaid Other | Admitting: Cardiology

## 2021-11-12 ENCOUNTER — Encounter: Payer: Self-pay | Admitting: Cardiology

## 2021-11-12 VITALS — BP 120/76 | HR 66 | Ht 70.0 in | Wt 122.4 lb

## 2021-11-12 DIAGNOSIS — I483 Typical atrial flutter: Secondary | ICD-10-CM

## 2021-11-12 NOTE — Progress Notes (Signed)
Clinical Summary Brady Bell is a 66 y.o.male seen today for follow up of the following medical problems.   Aflutter/atach -multiple admits 2019 with aflutter - most recent ER visit 08/2021 with aflutter with RVR, DCCV in ER. Recurrent afluter in the ER which subsequently self terminated.   - no recurrent episodes. - reports he stopped eliquis due to heavy bruising.  - unclear if taking diltiazem at home or not    2. PAD - s/p left iliofemoral endarterectomy and angioplasty in 03/2017   3.History of EtoH abuse  4. Hyperlipidemia - recent labs with Brady Bell Past Medical History:  Diagnosis Date   Chronic ankle pain    Chronic knee pain    COPD (chronic obstructive pulmonary disease) (HCC) 09/30/2018   on CT chest   GERD (gastroesophageal reflux disease)    Hepatitis    Hep A in 1980's   History of atrial flutter    Documented April/May 2019 in the setting of pancreatitis   Hypoglycemia    Migraines    Pancreatitis    Peripheral vascular disease (HCC)    Recurrent sinus infections    Seizures (HCC)    Shingles      No Known Allergies   Current Outpatient Medications  Medication Sig Dispense Refill   amphetamine-dextroamphetamine (ADDERALL) 20 MG tablet Take 10 mg by mouth 2 (two) times daily.     apixaban (ELIQUIS) 5 MG TABS tablet Take 1 tablet (5 mg total) by mouth 2 (two) times daily for 7 days. 14 tablet 0   cycloSPORINE (RESTASIS) 0.05 % ophthalmic emulsion Place 1 drop into both eyes 2 (two) times daily.     divalproex (DEPAKOTE ER) 500 MG 24 hr tablet Take 500 mg by mouth daily.     eletriptan (RELPAX) 40 MG tablet Take 40 mg by mouth every 2 (two) hours as needed for migraine or headache. May repeat in 2 hours if headache persists or recurs.     gabapentin (NEURONTIN) 300 MG capsule Take 300 mg by mouth daily.      Multiple Vitamin (MULTIVITAMIN WITH MINERALS) TABS tablet Take 1 tablet by mouth daily.     oxyCODONE (OXY IR/ROXICODONE) 5 MG  immediate release tablet Take 5 mg by mouth 2 (two) times daily.     pantoprazole (PROTONIX) 40 MG tablet Take 1 tablet (40 mg total) by mouth daily before breakfast. 30 tablet 12   rosuvastatin (CRESTOR) 10 MG tablet Take 10 mg by mouth daily.     sildenafil (VIAGRA) 50 MG tablet Take 50 mg by mouth daily.     SUMAtriptan (IMITREX) 100 MG tablet Take 1 tablet (100 mg total) by mouth every 2 (two) hours as needed for migraine. May repeat in 2 hours if headache persists or recurs. 12 tablet 11   traZODone (DESYREL) 50 MG tablet Take 50 mg by mouth at bedtime.     No current facility-administered medications for this visit.     Past Surgical History:  Procedure Laterality Date   ABDOMINAL AORTOGRAM N/A 04/23/2017   Procedure: ABDOMINAL AORTOGRAM;  Surgeon: Fransisco Hertz, MD;  Location: [facility] INVASIVE CV LAB;  Service: Cardiovascular;  Laterality: N/A;   CHOLECYSTECTOMY N/A 09/23/2017   Procedure: LAPAROSCOPIC CHOLECYSTECTOMY;  Surgeon: Franky Macho, MD;  Location: [facility]S;  Service: General;  Laterality: N/A;   ENDARTERECTOMY FEMORAL Left 04/28/2017   Procedure: ENDARTERECTOMY LEFT ILIO-FEMORAL ARTERY;  Surgeon: Fransisco Hertz, MD;  Location: [facility] OR;  Service: Vascular;  Laterality: Left;  FASCIOTOMY  09/06/2011   Procedure: FASCIOTOMY;  Surgeon: Fransisco Hertz, MD;  Location: [facility] OR;  Service: Vascular;  Laterality: Left;   FEMORAL-TIBIAL BYPASS GRAFT  09/06/2011   Procedure: BYPASS GRAFT FEMORAL-TIBIAL ARTERY;  Surgeon: Fransisco Hertz, MD;  Location: [facility] OR;  Service: Vascular;  Laterality: Left;   KNEE CARTILAGE SURGERY Left    LOWER EXTREMITY ANGIOGRAM N/A 09/05/2011   Procedure: LOWER EXTREMITY ANGIOGRAM;  Surgeon: Sherren Kerns, MD;  Location: [facility] CATH LAB;  Service: Cardiovascular;  Laterality: N/A;   LOWER EXTREMITY ANGIOGRAM Left 04/15/2012   Procedure: LOWER EXTREMITY ANGIOGRAM;  Surgeon: Fransisco Hertz, MD;  Location: [facility] CATH LAB;  Service: Cardiovascular;  Laterality: Left;   LOWER EXTREMITY  ANGIOGRAPHY Bilateral 04/23/2017   Procedure: Lower Extremity Angiography;  Surgeon: Fransisco Hertz, MD;  Location: [facility] INVASIVE CV LAB;  Service: Cardiovascular;  Laterality: Bilateral;   PATCH ANGIOPLASTY Left 04/28/2017   Procedure: ILIO-FEMORAL ARTERY PATCH ANGIOPLASTY USING XENOSURE BIOLOGIC PATCH;  Surgeon: Fransisco Hertz, MD;  Location: [facility] OR;  Service: Vascular;  Laterality: Left;   PR VEIN BYPASS GRAFT,AORTO-FEM-POP  09/06/11   Left    TOOTH EXTRACTION  June-July-Aug. 2015   Several      No Known Allergies    Family History  Problem Relation Age of Onset   Alcohol abuse Father    Kidney failure Father    Other Mother        "age"   Diabetes Other      Social History Brady Bell reports that he quit smoking about 2 years ago. His smoking use included cigarettes. He has a 45.00 pack-year smoking history. He has never used smokeless tobacco. Brady Bell reports that he does not currently use alcohol after a past usage of about 3.0 standard drinks of alcohol per week.   Review of Systems CONSTITUTIONAL: No weight loss, fever, chills, weakness or fatigue.  HEENT: Eyes: No visual loss, blurred vision, double vision or yellow sclerae.No hearing loss, sneezing, congestion, runny nose or sore throat.  SKIN: No rash or itching.  CARDIOVASCULAR: per hpi RESPIRATORY: No shortness of breath, cough or sputum.  GASTROINTESTINAL: No anorexia, nausea, vomiting or diarrhea. No abdominal pain or blood.  GENITOURINARY: No burning on urination, no polyuria NEUROLOGICAL: No headache, dizziness, syncope, paralysis, ataxia, numbness or tingling in the extremities. No change in bowel or bladder control.  MUSCULOSKELETAL: No muscle, back pain, joint pain or stiffness.  LYMPHATICS: No enlarged nodes. No history of splenectomy.  PSYCHIATRIC: No history of depression or anxiety.  ENDOCRINOLOGIC: No reports of sweating, cold or heat intolerance. No polyuria or polydipsia.  Marland Kitchen   Physical  Examination Today's Vitals   11/12/21 0903  BP: 120/76  Pulse: 66  SpO2: 97%  Weight: 122 lb 6.4 oz (55.5 kg)  Height: 5\' 10"  (1.778 m)   Body mass index is 17.56 kg/m.  Gen: resting comfortably, no acute distress HEENT: no scleral icterus, pupils equal round and reactive, no palptable cervical adenopathy,  CV: RRR, no m/r gno jvd Resp: Clear to auscultation bilaterally GI: abdomen is soft, non-tender, non-distended, normal bowel sounds, no hepatosplenomegaly MSK: extremities are warm, no edema.  Skin: warm, no rash Neuro:  no focal deficits Psych: appropriate affect   Diagnostic Studies   07/2017 echo Study Conclusions   - Left ventricle: The cavity size was normal. Wall thickness was    normal. Systolic function was normal. The estimated ejection    fraction was in the range of 60% to  65%. Wall motion was normal;    there were no regional wall motion abnormalities. Left    ventricular diastolic function parameters were normal.   Impressions:   - Normal study.    Assessment and Plan  1.Paroxysmal aflutter - no recent symptoms - reports bruising and lightheadedness on eliquis, he stopped taking. Not interesting in retrying. No interest in watchman device. We discussed in detail the increased stroke risk associated with aflutter and how anticoagulation signfificantly lowers that risk - unclear if taking diltiazem at home, pharmacy states last filled in December. He will call us with his home pill bottles to clarify, if not taking given current HR likely would start diltiazem 30mg  bid - from ER note they called his pharmacy and found out he had propranolol, from his report he is not taking at home - essentially very unclear what he is and is not taking at home, he will call with his pill bottles so we can clarify.          [place], M.D

## 2021-11-12 NOTE — Patient Instructions (Addendum)
Medication Instructions:  Continue all current medications.  Labwork: none  Testing/Procedures: none  Follow-Up: 6 months   Any Other Special Instructions Will Be Listed Below (If Applicable). Please call the office back or send mychart message after reviewing home medications.    If you need a refill on your cardiac medications before your next appointment, please call your pharmacy.

## 2021-11-14 ENCOUNTER — Encounter: Payer: Self-pay | Admitting: *Deleted

## 2021-12-04 ENCOUNTER — Ambulatory Visit (HOSPITAL_COMMUNITY): Payer: Medicaid Other | Attending: Adult Health Nurse Practitioner | Admitting: Physical Therapy

## 2021-12-04 ENCOUNTER — Encounter (HOSPITAL_COMMUNITY): Payer: Self-pay | Admitting: Physical Therapy

## 2021-12-04 DIAGNOSIS — Z9181 History of falling: Secondary | ICD-10-CM

## 2021-12-04 DIAGNOSIS — R29898 Other symptoms and signs involving the musculoskeletal system: Secondary | ICD-10-CM | POA: Insufficient documentation

## 2021-12-04 DIAGNOSIS — M25512 Pain in left shoulder: Secondary | ICD-10-CM | POA: Insufficient documentation

## 2021-12-04 DIAGNOSIS — G8929 Other chronic pain: Secondary | ICD-10-CM | POA: Diagnosis present

## 2021-12-04 DIAGNOSIS — M5459 Other low back pain: Secondary | ICD-10-CM | POA: Diagnosis present

## 2021-12-04 NOTE — Therapy (Signed)
OUTPATIENT PHYSICAL THERAPY THORACOLUMBAR EVALUATION   Patient Name: Brady Bell MRN: [id number] DOB:September 13, 1955, 66 y.o., male Today's Date: 12/04/2021   PT End of Session - 12/04/21 0821     Visit Number 1    Number of Visits 12    Date for PT Re-Evaluation 01/15/22    Authorization Type Medicaid [place]    Authorization Time Period 3 visits requested from 9/6 -9/27    Authorization - Visit Number 1    Authorization - Number of Visits 1    Progress Note Due on Visit 4    PT Start Time 0903    PT Stop Time 0946    PT Time Calculation (min) 43 min    Activity Tolerance Patient limited by pain    Behavior During Therapy Agitated             Past Medical History:  Diagnosis Date   Chronic ankle pain    Chronic knee pain    COPD (chronic obstructive pulmonary disease) (HCC) 09/30/2018   on CT chest   GERD (gastroesophageal reflux disease)    Hepatitis    Hep A in 1980's   History of atrial flutter    Documented April/May 2019 in the setting of pancreatitis   Hypoglycemia    Migraines    Pancreatitis    Peripheral vascular disease (HCC)    Recurrent sinus infections    Seizures (HCC)    Shingles    Past Surgical History:  Procedure Laterality Date   ABDOMINAL AORTOGRAM N/A 04/23/2017   Procedure: ABDOMINAL AORTOGRAM;  Surgeon: Fransisco Hertz, MD;  Location: [facility] INVASIVE CV LAB;  Service: Cardiovascular;  Laterality: N/A;   CHOLECYSTECTOMY N/A 09/23/2017   Procedure: LAPAROSCOPIC CHOLECYSTECTOMY;  Surgeon: Franky Macho, MD;  Location: [facility] ORS;  Service: General;  Laterality: N/A;   ENDARTERECTOMY FEMORAL Left 04/28/2017   Procedure: ENDARTERECTOMY LEFT ILIO-FEMORAL ARTERY;  Surgeon: Fransisco Hertz, MD;  Location: [facility] OR;  Service: Vascular;  Laterality: Left;   FASCIOTOMY  09/06/2011   Procedure: FASCIOTOMY;  Surgeon: Fransisco Hertz, MD;  Location: [facility] OR;  Service: Vascular;  Laterality: Left;   FEMORAL-TIBIAL BYPASS GRAFT  09/06/2011   Procedure: BYPASS GRAFT FEMORAL-TIBIAL  ARTERY;  Surgeon: Fransisco Hertz, MD;  Location: [facility] OR;  Service: Vascular;  Laterality: Left;   KNEE CARTILAGE SURGERY Left    LOWER EXTREMITY ANGIOGRAM N/A 09/05/2011   Procedure: LOWER EXTREMITY ANGIOGRAM;  Surgeon: Sherren Kerns, MD;  Location: [facility] CATH LAB;  Service: Cardiovascular;  Laterality: N/A;   LOWER EXTREMITY ANGIOGRAM Left 04/15/2012   Procedure: LOWER EXTREMITY ANGIOGRAM;  Surgeon: Fransisco Hertz, MD;  Location: [facility] CATH LAB;  Service: Cardiovascular;  Laterality: Left;   LOWER EXTREMITY ANGIOGRAPHY Bilateral 04/23/2017   Procedure: Lower Extremity Angiography;  Surgeon: Fransisco Hertz, MD;  Location: [facility] INVASIVE CV LAB;  Service: Cardiovascular;  Laterality: Bilateral;   PATCH ANGIOPLASTY Left 04/28/2017   Procedure: ILIO-FEMORAL ARTERY PATCH ANGIOPLASTY USING XENOSURE BIOLOGIC PATCH;  Surgeon: Fransisco Hertz, MD;  Location: [facility] OR;  Service: Vascular;  Laterality: Left;   PR VEIN BYPASS GRAFT,AORTO-FEM-POP  09/06/11   Left    TOOTH EXTRACTION  June-July-Aug. 2015   Several    Patient Active Problem List   Diagnosis Date Noted   Chronic alcohol abuse 12/16/2018   Leukocytosis 12/16/2018   Alcohol withdrawal (HCC) 10/02/2018   COPD (chronic obstructive pulmonary disease) (HCC) 09/30/2018   Pancreatitis, acute 04/12/2018   Tobacco abuse 04/12/2018   GERD (gastroesophageal  reflux disease) 04/12/2018   Alcohol abuse 04/12/2018   Hx of seizure disorder 04/12/2018   History of atrial flutter 04/12/2018   HTN (hypertension) 04/12/2018   Gallstone pancreatitis    Erythrocytosis 09/10/2017   Calculus of gallbladder with cholecystitis without biliary obstruction    Atrial flutter (HCC) 07/29/2017   Acute gallstone pancreatitis 07/22/2017   Acute alcoholic pancreatitis 07/22/2017   Hypertensive urgency 07/22/2017   Acute pancreatitis 07/22/2017   Accelerated hypertension    Tobacco abuse counseling    PAD (peripheral artery disease) (HCC) 04/28/2017   Chronic migraine 02/11/2017    New daily persistent headache 12/08/2016   Peripheral vascular disease (HCC) 12/08/2016   Pain in joint, lower leg 02/10/2014   Numbness and tingling of left leg 12/16/2013   Cramps of left lower extremity-Leg 12/16/2013   Aftercare following surgery of the circulatory system, NEC 12/16/2013   PVD (peripheral vascular disease) with claudication (HCC) 05/07/2012   Pain in limb 03/26/2012   Pain, lower leg 12/26/2011   Atherosclerosis of native arteries of extremity with intermittent claudication (HCC) 09/26/2011   CLOSED FRACTURE OF UPPER END OF TIBIA 02/27/2010   TEAR MEDIAL MENISCUS 02/27/2010    PCP: Roe Rutherford, NP  REFERRING PROVIDER: Roe Rutherford, NP  REFERRING DIAG: M54.31 rt sciatic nerve pain and z91.81 hx of recent fall   Rationale for Evaluation and Treatment Rehabilitation  THERAPY DIAG:  Other low back pain  History of falling  ONSET DATE: April 2023 after a fall  SUBJECTIVE:                                                                                                                                                                                           SUBJECTIVE STATEMENT: Patient states he tripped over a chair in his apartment resulting in back pain, mostly on the right side of his back. Pain has since radiated down the right posterior thigh and into the calf. There is reported numbness here. Having trouble with sit to stand transfers, showering, and walking. Trouble sleeping, up to 4 hours of sleeping before the pain wakes him. States symptoms have gotten worse. Temporary pain relief from 2 different lumbar injections but those wore off in less than 1 week. PERTINENT HISTORY:  Reports Lt knee 2 arthroscopy, and popliteal bypass on Lt  PAIN:  Are you having pain? Yes: NPRS scale: 10/10 Pain location: back and RLE Pain description: sharp, spasm, sore Aggravating factors: transfers, standing, walking, lying down Relieving factors:  medication   PRECAUTIONS: None  WEIGHT BEARING RESTRICTIONS No  FALLS:  Has patient fallen in last 6 months? Yes. Number of falls 1  LIVING ENVIRONMENT: Lives with: lives with their family and lives alone Lives in: House/apartment Stairs: No Has following equipment at home: Single point cane and Environmental consultant - 2 wheeled  OCCUPATION: disabled  PLOF: Independent  PATIENT GOALS Decrease pain   OBJECTIVE:    PATIENT SURVEYS:  Modified Oswestry  31 / 50 or 62 %   SCREENING FOR RED FLAGS: Bowel or bladder incontinence: No Spinal tumors: No Cauda equina syndrome: No Compression fracture: No Abdominal aneurysm: No  COGNITION:  Overall cognitive status: Within functional limits for tasks assessed     SENSATION: WFL    POSTURE: No Significant postural limitations  PALPATION: TrPs Rt glute TTP with cPA L4-5  LUMBAR ROM:   Active  A/PROM  eval  Flexion WNL P!  Extension Limited 50% P!  Right lateral flexion Limited 25% P!  Left lateral flexion Limited 25%  Right rotation   Left rotation    (Blank rows = not tested)   LOWER EXTREMITY MMT:    MMT Right eval Left eval  Hip flexion 3+ 4  Hip extension 3 3+  Hip abduction 4- 4  Hip adduction    Hip internal rotation    Hip external rotation    Knee flexion 4 4+  Knee extension 4+ 5  Ankle dorsiflexion 5 5  Ankle plantarflexion    Ankle inversion    Ankle eversion     (Blank rows = not tested)  LUMBAR SPECIAL TESTS:  Straight leg raise test: Positive and Slump test: Positive RT  FUNCTIONAL TESTS:  5 times sit to stand: 27 seconds no hands 2 minute walk test: 304' no AD  GAIT: Distance walked: 9' Assistive device utilized: None Level of assistance: Modified independence Comments: Antalgic gait pattern, decreased stance time on Rt, good posture, no buckling noted, stops intermittently due to pain    TODAY'S TREATMENT  Eval 5x sit to stand HEP   PATIENT EDUCATION:  Education  details: Findings, symptom awareness, activity modification, modalities as needed. Person educated: Patient Education method: Explanation Education comprehension: verbalized understanding   HOME EXERCISE PROGRAM: Access Code: [email or web address] URL: [email or web address] Date: 12/04/2021 Prepared by: Kathlyn Sacramento  Exercises - Supine Transversus Abdominis Bracing - Hands on Stomach  - 3 x daily - 7 x weekly - 2 sets - 10 reps - Supine Piriformis Stretch with Foot on Ground  - 3 x daily - 7 x weekly - 2 sets - 10 reps - Prone Press Up On Elbows  - 3 x daily - 7 x weekly - 3 sets - 5 reps  ASSESSMENT:  CLINICAL IMPRESSION: Patient is a 66 y.o. male who was seen today for physical therapy evaluation and treatment for lower back pain with radiculopathy into RLE since April after sustaining a fall at home. Symptoms have gotten progressively worse and feels injections have only been temporarily helpful. Symptoms reportedly  causing patient to have difficulty with all ADLs, especially aggravated by standing, walking, and lying flat which interrupts his sleep. Currently demonstrates reduced strength, impaired lumbar ROM, +SLR and slump tests on Rt, reduced functional abilities with 5 time sit to stand, gait abnormalities, and increased disability score with Oswestry Low Back Pain Disability questionnaire at 62%. Patient may benefit from skilled physical therapy intervention to address deficits listed above and assist with return to PLOF.   OBJECTIVE IMPAIRMENTS Abnormal gait, decreased activity tolerance, decreased balance, decreased endurance, decreased knowledge of condition, decreased mobility, difficulty walking, decreased ROM, decreased strength, increased fascial restrictions, impaired flexibility, and pain.  ACTIVITY LIMITATIONS carrying, lifting, bending, sitting, standing, squatting, sleeping, stairs, transfers, and bathing  PARTICIPATION LIMITATIONS: cleaning, laundry, driving,  shopping, community activity, and yard work  PERSONAL FACTORS Past/current experiences and Time since onset of injury/illness/exacerbation are also affecting patient's functional outcome.   REHAB POTENTIAL: Good  CLINICAL DECISION MAKING: Stable/uncomplicated  EVALUATION COMPLEXITY: Low   GOALS: Goals reviewed with patient? Yes  SHORT TERM GOALS: Target date: 12/25/21  Patient will be independent with initial HEP and self-management strategies to improve functional outcomes Baseline: Initiated Goal status: INITIAL   2. Patient will demonstrate >4 point reduction with Oswestry Low Back Disability Questionnaire to show improvement in subjective functional level.    Baseline: 31/50 = 62%    Goal Status: Initial LONG TERM GOALS: Target date: 01/15/22  Patient will be independent with advanced HEP and self-management strategies to improve functional outcomes Baseline: n/a Goal status: INITIAL  2.  Patient will improve Oswestry Low Back Disability Questionnaire score by 12 points (MCID) or greater to indicate improvement in functional outcomes Baseline: 31/50 = 62% Goal status: INITIAL  3.  Patient will report reduction of back pain to <3/10 for improved quality of life and ability to perform ADLs  Baseline: 10/10 Goal status: INITIAL  4. Patient will have equal to or > 4+/5 MMT throughout BIL LEs to improve ability to perform functional mobility, stair ambulation and ADLs.  Baseline: See above Goal status: INITIAL  5. Patient will be able to ambulate at least 375 feet during with LRAD to demonstrate improved ability to perform functional mobility and associated tasks. Baseline: 306 feet no AD Goal status: INITIAL     PLAN: PT FREQUENCY: 2x/week  PT DURATION: 6 weeks  PLANNED INTERVENTIONS: Therapeutic exercises, Therapeutic activity, Neuromuscular re-education, Balance training, Gait training, Patient/Family education, Self Care, Joint mobilization, Joint  manipulation, Stair training, DME instructions, Dry Needling, Spinal manipulation, Spinal mobilization, Cryotherapy, Moist heat, Traction, Manual therapy, and Re-evaluation.  PLAN FOR NEXT SESSION: Progress lumbar flexibility, centralization of symptoms (RLE radiculopathy,) core strengthening.   Kathlyn Sacramento, PT, DPT Physical Therapist Acute Rehabilitation Services [facility] Outpatient Rehabilitation Services [facility]  12/04/2021, 10:30 AM

## 2021-12-10 ENCOUNTER — Encounter (HOSPITAL_COMMUNITY): Payer: Self-pay | Admitting: Occupational Therapy

## 2021-12-10 ENCOUNTER — Ambulatory Visit (HOSPITAL_COMMUNITY): Payer: Medicaid Other | Admitting: Physical Therapy

## 2021-12-10 ENCOUNTER — Ambulatory Visit (HOSPITAL_COMMUNITY): Payer: Medicaid Other | Admitting: Occupational Therapy

## 2021-12-10 DIAGNOSIS — R29898 Other symptoms and signs involving the musculoskeletal system: Secondary | ICD-10-CM

## 2021-12-10 DIAGNOSIS — M5459 Other low back pain: Secondary | ICD-10-CM

## 2021-12-10 DIAGNOSIS — Z9181 History of falling: Secondary | ICD-10-CM

## 2021-12-10 DIAGNOSIS — G8929 Other chronic pain: Secondary | ICD-10-CM

## 2021-12-10 NOTE — Therapy (Signed)
OUTPATIENT PHYSICAL THERAPY TREATMENT   Patient Name: Brady Bell MRN: [id number] DOB:04/21/1955, 66 y.o., male Today's Date: 12/10/2021   PT End of Session - 12/10/21 1353     Visit Number 2    Number of Visits 12    Date for PT Re-Evaluation 01/15/22    Authorization Type Medicaid [place]    Authorization Time Period 3 visits requested from 9/6 -9/27    Authorization - Visit Number 1    Authorization - Number of Visits 3    Progress Note Due on Visit 4    PT Start Time 1350    PT Stop Time 1430    PT Time Calculation (min) 40 min    Activity Tolerance Patient limited by pain    Behavior During Therapy Agitated             Past Medical History:  Diagnosis Date   Chronic ankle pain    Chronic knee pain    COPD (chronic obstructive pulmonary disease) (HCC) 09/30/2018   on CT chest   GERD (gastroesophageal reflux disease)    Hepatitis    Hep A in 1980's   History of atrial flutter    Documented April/May 2019 in the setting of pancreatitis   Hypoglycemia    Migraines    Pancreatitis    Peripheral vascular disease (HCC)    Recurrent sinus infections    Seizures (HCC)    Shingles    Past Surgical History:  Procedure Laterality Date   ABDOMINAL AORTOGRAM N/A 04/23/2017   Procedure: ABDOMINAL AORTOGRAM;  Surgeon: Fransisco Hertz, MD;  Location: [facility] INVASIVE CV LAB;  Service: Cardiovascular;  Laterality: N/A;   CHOLECYSTECTOMY N/A 09/23/2017   Procedure: LAPAROSCOPIC CHOLECYSTECTOMY;  Surgeon: Franky Macho, MD;  Location: [facility] ORS;  Service: General;  Laterality: N/A;   ENDARTERECTOMY FEMORAL Left 04/28/2017   Procedure: ENDARTERECTOMY LEFT ILIO-FEMORAL ARTERY;  Surgeon: Fransisco Hertz, MD;  Location: [facility] OR;  Service: Vascular;  Laterality: Left;   FASCIOTOMY  09/06/2011   Procedure: FASCIOTOMY;  Surgeon: Fransisco Hertz, MD;  Location: [facility] OR;  Service: Vascular;  Laterality: Left;   FEMORAL-TIBIAL BYPASS GRAFT  09/06/2011   Procedure: BYPASS GRAFT FEMORAL-TIBIAL ARTERY;   Surgeon: Fransisco Hertz, MD;  Location: [facility] OR;  Service: Vascular;  Laterality: Left;   KNEE CARTILAGE SURGERY Left    LOWER EXTREMITY ANGIOGRAM N/A 09/05/2011   Procedure: LOWER EXTREMITY ANGIOGRAM;  Surgeon: Sherren Kerns, MD;  Location: [facility] CATH LAB;  Service: Cardiovascular;  Laterality: N/A;   LOWER EXTREMITY ANGIOGRAM Left 04/15/2012   Procedure: LOWER EXTREMITY ANGIOGRAM;  Surgeon: Fransisco Hertz, MD;  Location: [facility] CATH LAB;  Service: Cardiovascular;  Laterality: Left;   LOWER EXTREMITY ANGIOGRAPHY Bilateral 04/23/2017   Procedure: Lower Extremity Angiography;  Surgeon: Fransisco Hertz, MD;  Location: [facility] INVASIVE CV LAB;  Service: Cardiovascular;  Laterality: Bilateral;   PATCH ANGIOPLASTY Left 04/28/2017   Procedure: ILIO-FEMORAL ARTERY PATCH ANGIOPLASTY USING XENOSURE BIOLOGIC PATCH;  Surgeon: Fransisco Hertz, MD;  Location: [facility] OR;  Service: Vascular;  Laterality: Left;   PR VEIN BYPASS GRAFT,AORTO-FEM-POP  09/06/11   Left    TOOTH EXTRACTION  June-July-Aug. 2015   Several    Patient Active Problem List   Diagnosis Date Noted   Chronic alcohol abuse 12/16/2018   Leukocytosis 12/16/2018   Alcohol withdrawal (HCC) 10/02/2018   COPD (chronic obstructive pulmonary disease) (HCC) 09/30/2018   Pancreatitis, acute 04/12/2018   Tobacco abuse 04/12/2018   GERD (gastroesophageal reflux  disease) 04/12/2018   Alcohol abuse 04/12/2018   Hx of seizure disorder 04/12/2018   History of atrial flutter 04/12/2018   HTN (hypertension) 04/12/2018   Gallstone pancreatitis    Erythrocytosis 09/10/2017   Calculus of gallbladder with cholecystitis without biliary obstruction    Atrial flutter (HCC) 07/29/2017   Acute gallstone pancreatitis 07/22/2017   Acute alcoholic pancreatitis 07/22/2017   Hypertensive urgency 07/22/2017   Acute pancreatitis 07/22/2017   Accelerated hypertension    Tobacco abuse counseling    PAD (peripheral artery disease) (HCC) 04/28/2017   Chronic migraine 02/11/2017   New daily  persistent headache 12/08/2016   Peripheral vascular disease (HCC) 12/08/2016   Pain in joint, lower leg 02/10/2014   Numbness and tingling of left leg 12/16/2013   Cramps of left lower extremity-Leg 12/16/2013   Aftercare following surgery of the circulatory system, NEC 12/16/2013   PVD (peripheral vascular disease) with claudication (HCC) 05/07/2012   Pain in limb 03/26/2012   Pain, lower leg 12/26/2011   Atherosclerosis of native arteries of extremity with intermittent claudication (HCC) 09/26/2011   CLOSED FRACTURE OF UPPER END OF TIBIA 02/27/2010   TEAR MEDIAL MENISCUS 02/27/2010    PCP: Roe Rutherford, NP  REFERRING PROVIDER: Roe Rutherford, NP  REFERRING DIAG: M54.31 rt sciatic nerve pain and z91.81 hx of recent fall   Rationale for Evaluation and Treatment Rehabilitation  THERAPY DIAG:  No diagnosis found.  ONSET DATE: April 2023 after a fall  SUBJECTIVE:                                                                                                                                                                                           SUBJECTIVE STATEMENT: Patient states he has 6/10 pain today mostly on Right side where his belt hit.    PERTINENT HISTORY:  Reports Lt knee 2 arthroscopy, and popliteal bypass on Lt  PAIN:  Are you having pain? Yes: NPRS scale: 6/10 Pain location: back and RLE Pain description: sharp, spasm, sore Aggravating factors: transfers, standing, walking, lying down Relieving factors: medication   PRECAUTIONS: None  WEIGHT BEARING RESTRICTIONS No  FALLS:  Has patient fallen in last 6 months? Yes. Number of falls 1  LIVING ENVIRONMENT: Lives with: lives with their family and lives alone Lives in: House/apartment Stairs: No Has following equipment at home: Single point cane and Environmental consultant - 2 wheeled  OCCUPATION: disabled  PLOF: Independent  PATIENT GOALS Decrease pain   OBJECTIVE:    PATIENT SURVEYS:  Modified Oswestry   31 / 50 or 62 %   SCREENING FOR RED FLAGS: Bowel or bladder incontinence: No Spinal tumors: No  Cauda equina syndrome: No Compression fracture: No Abdominal aneurysm: No  COGNITION:  Overall cognitive status: Within functional limits for tasks assessed     SENSATION: WFL    POSTURE: No Significant postural limitations  PALPATION: TrPs Rt glute TTP with cPA L4-5  LUMBAR ROM:   Active  A/PROM  eval  Flexion WNL P!  Extension Limited 50% P!  Right lateral flexion Limited 25% P!  Left lateral flexion Limited 25%  Right rotation   Left rotation    (Blank rows = not tested)   LOWER EXTREMITY MMT:    MMT Right eval Left eval  Hip flexion 3+ 4  Hip extension 3 3+  Hip abduction 4- 4  Hip adduction    Hip internal rotation    Hip external rotation    Knee flexion 4 4+  Knee extension 4+ 5  Ankle dorsiflexion 5 5  Ankle plantarflexion    Ankle inversion    Ankle eversion     (Blank rows = not tested)  LUMBAR SPECIAL TESTS:  Straight leg raise test: Positive and Slump test: Positive RT  FUNCTIONAL TESTS:  5 times sit to stand: 27 seconds no hands 2 minute walk test: 304' no AD  GAIT: Distance walked: 10' Assistive device utilized: None Level of assistance: Modified independence Comments: Antalgic gait pattern, decreased stance time on Rt, good posture, no buckling noted, stops intermittently due to pain    TODAY'S TREATMENT  12/10/21 Review of goals and POC  Seated: piriformis stretch 3X20" Supine: ab isometric 10X5" with breathing cues  Bridge 10X  Glute sets 10X5"  SLR with ab set 10X Education:  posture and breathing.   Eval 5x sit to stand HEP   PATIENT EDUCATION:  Education details: Findings, symptom awareness, activity modification, modalities as needed. Person educated: Patient Education method: Explanation Education comprehension: verbalized understanding   HOME EXERCISE PROGRAM: Access Code: [email or web address] URL:  [email or web address] Date: 12/04/2021 Prepared by: Kathlyn Sacramento  Exercises - Supine Transversus Abdominis Bracing - Hands on Stomach  - 3 x daily - 7 x weekly - 2 sets - 10 reps - Supine Piriformis Stretch with Foot on Ground  - 3 x daily - 7 x weekly - 2 sets - 10 reps - Prone Press Up On Elbows  - 3 x daily - 7 x weekly - 3 sets - 5 reps  ASSESSMENT:  CLINICAL IMPRESSION: Reveiwed goals, HEP and POC moving forward.  Pt with questions regarding suggestions for pain from belt.  Suggested drawstring/elastic waist pants to see if it improves without wearing a belt.  Going to a back specialist in October.  Notably poor sitting posturing which could contribute to this pain.  Educated on how to correct. No new exercises added to HEP this session with focus to continue on those given at evaluation and postural awareness.  Patient will continue to benefit from skilled physical therapy intervention to address deficits and assist with return to PLOF.   OBJECTIVE IMPAIRMENTS Abnormal gait, decreased activity tolerance, decreased balance, decreased endurance, decreased knowledge of condition, decreased mobility, difficulty walking, decreased ROM, decreased strength, increased fascial restrictions, impaired flexibility, and pain.   ACTIVITY LIMITATIONS carrying, lifting, bending, sitting, standing, squatting, sleeping, stairs, transfers, and bathing  PARTICIPATION LIMITATIONS: cleaning, laundry, driving, shopping, community activity, and yard work  PERSONAL FACTORS Past/current experiences and Time since onset of injury/illness/exacerbation are also affecting patient's functional outcome.   REHAB POTENTIAL: Good  CLINICAL DECISION MAKING: Stable/uncomplicated  EVALUATION COMPLEXITY: Low   GOALS: Goals  reviewed with patient? Yes  SHORT TERM GOALS: Target date: 12/25/21  Patient will be independent with initial HEP and self-management strategies to improve functional  outcomes Baseline: Initiated Goal status: IN PROGRESS   2. Patient will demonstrate >4 point reduction with Oswestry Low Back Disability Questionnaire to show improvement in subjective functional level.    Baseline: 31/50 = 62%    Goal Status: IN PROGRESS  LONG TERM GOALS: Target date: 01/15/22  Patient will be independent with advanced HEP and self-management strategies to improve functional outcomes Baseline: n/a Goal status: IN PROGRESS  2.  Patient will improve Oswestry Low Back Disability Questionnaire score by 12 points (MCID) or greater to indicate improvement in functional outcomes Baseline: 31/50 = 62% Goal status: IN PROGRESS  3.  Patient will report reduction of back pain to <3/10 for improved quality of life and ability to perform ADLs  Baseline: 10/10 Goal status: IN PROGRESS  4. Patient will have equal to or > 4+/5 MMT throughout BIL LEs to improve ability to perform functional mobility, stair ambulation and ADLs.  Baseline: See above Goal status: IN PROGRESS  5. Patient will be able to ambulate at least 375 feet during with LRAD to demonstrate improved ability to perform functional mobility and associated tasks. Baseline: 306 feet no AD Goal status: IN PROGRESS     PLAN: PT FREQUENCY: 2x/week  PT DURATION: 6 weeks  PLANNED INTERVENTIONS: Therapeutic exercises, Therapeutic activity, Neuromuscular re-education, Balance training, Gait training, Patient/Family education, Self Care, Joint mobilization, Joint manipulation, Stair training, DME instructions, Dry Needling, Spinal manipulation, Spinal mobilization, Cryotherapy, Moist heat, Traction, Manual therapy, and Re-evaluation.  PLAN FOR NEXT SESSION: Progress lumbar flexibility, centralization of symptoms (RLE radiculopathy,) core strengthening.  Begin hip excursions and hip strengthening.  Lurena Nida, PTA/CLT [facility] [place] Ph: [phone]   12/10/2021, 1:54  PM

## 2021-12-10 NOTE — Therapy (Signed)
OUTPATIENT OCCUPATIONAL THERAPY ORTHO EVALUATION  Patient Name: Brady Bell MRN: [id number] DOB:[birth year], 66 y.o., male Today's Date: 12/10/2021  PCP: Roe Rutherford, NP REFERRING PROVIDER: Margarita Rana, MD   OT End of Session - 12/10/21 1541     Visit Number 1    Number of Visits 4    Date for OT Re-Evaluation 01/09/22    Authorization Type Medicaid    Authorization Time Period Requesting initial 3 visits    Authorization - Visit Number 0    Authorization - Number of Visits 3    OT Start Time 1300    OT Stop Time 1330    OT Time Calculation (min) 30 min    Activity Tolerance Patient tolerated treatment well    Behavior During Therapy [facility] for tasks assessed/performed             Past Medical History:  Diagnosis Date   Chronic ankle pain    Chronic knee pain    COPD (chronic obstructive pulmonary disease) (HCC) 09/30/2018   on CT chest   GERD (gastroesophageal reflux disease)    Hepatitis    Hep A in 1980's   History of atrial flutter    Documented April/May 2019 in the setting of pancreatitis   Hypoglycemia    Migraines    Pancreatitis    Peripheral vascular disease (HCC)    Recurrent sinus infections    Seizures (HCC)    Shingles    Past Surgical History:  Procedure Laterality Date   ABDOMINAL AORTOGRAM N/A 04/23/2017   Procedure: ABDOMINAL AORTOGRAM;  Surgeon: Fransisco Hertz, MD;  Location: [facility] INVASIVE CV LAB;  Service: Cardiovascular;  Laterality: N/A;   CHOLECYSTECTOMY N/A 09/23/2017   Procedure: LAPAROSCOPIC CHOLECYSTECTOMY;  Surgeon: Franky Macho, MD;  Location: [facility] ORS;  Service: General;  Laterality: N/A;   ENDARTERECTOMY FEMORAL Left 04/28/2017   Procedure: ENDARTERECTOMY LEFT ILIO-FEMORAL ARTERY;  Surgeon: Fransisco Hertz, MD;  Location: [facility] OR;  Service: Vascular;  Laterality: Left;   FASCIOTOMY  09/06/2011   Procedure: FASCIOTOMY;  Surgeon: Fransisco Hertz, MD;  Location: [facility] OR;  Service: Vascular;  Laterality: Left;   FEMORAL-TIBIAL BYPASS GRAFT   09/06/2011   Procedure: BYPASS GRAFT FEMORAL-TIBIAL ARTERY;  Surgeon: Fransisco Hertz, MD;  Location: [facility] OR;  Service: Vascular;  Laterality: Left;   KNEE CARTILAGE SURGERY Left    LOWER EXTREMITY ANGIOGRAM N/A 09/05/2011   Procedure: LOWER EXTREMITY ANGIOGRAM;  Surgeon: Sherren Kerns, MD;  Location: [facility] CATH LAB;  Service: Cardiovascular;  Laterality: N/A;   LOWER EXTREMITY ANGIOGRAM Left 04/15/2012   Procedure: LOWER EXTREMITY ANGIOGRAM;  Surgeon: Fransisco Hertz, MD;  Location: [facility] CATH LAB;  Service: Cardiovascular;  Laterality: Left;   LOWER EXTREMITY ANGIOGRAPHY Bilateral 04/23/2017   Procedure: Lower Extremity Angiography;  Surgeon: Fransisco Hertz, MD;  Location: [facility] INVASIVE CV LAB;  Service: Cardiovascular;  Laterality: Bilateral;   PATCH ANGIOPLASTY Left 04/28/2017   Procedure: ILIO-FEMORAL ARTERY PATCH ANGIOPLASTY USING XENOSURE BIOLOGIC PATCH;  Surgeon: Fransisco Hertz, MD;  Location: [facility] OR;  Service: Vascular;  Laterality: Left;   PR VEIN BYPASS GRAFT,AORTO-FEM-POP  09/06/11   Left    TOOTH EXTRACTION  June-July-Aug. 2015   Several    Patient Active Problem List   Diagnosis Date Noted   Chronic alcohol abuse 12/16/2018   Leukocytosis 12/16/2018   Alcohol withdrawal (HCC) 10/02/2018   COPD (chronic obstructive pulmonary disease) (HCC) 09/30/2018   Pancreatitis, acute 04/12/2018   Tobacco abuse 04/12/2018   GERD (gastroesophageal  reflux disease) 04/12/2018   Alcohol abuse 04/12/2018   Hx of seizure disorder 04/12/2018   History of atrial flutter 04/12/2018   HTN (hypertension) 04/12/2018   Gallstone pancreatitis    Erythrocytosis 09/10/2017   Calculus of gallbladder with cholecystitis without biliary obstruction    Atrial flutter (HCC) 07/29/2017   Acute gallstone pancreatitis 07/22/2017   Acute alcoholic pancreatitis 07/22/2017   Hypertensive urgency 07/22/2017   Acute pancreatitis 07/22/2017   Accelerated hypertension    Tobacco abuse counseling    PAD (peripheral artery disease)  (HCC) 04/28/2017   Chronic migraine 02/11/2017   New daily persistent headache 12/08/2016   Peripheral vascular disease (HCC) 12/08/2016   Pain in joint, lower leg 02/10/2014   Numbness and tingling of left leg 12/16/2013   Cramps of left lower extremity-Leg 12/16/2013   Aftercare following surgery of the circulatory system, NEC 12/16/2013   PVD (peripheral vascular disease) with claudication (HCC) 05/07/2012   Pain in limb 03/26/2012   Pain, lower leg 12/26/2011   Atherosclerosis of native arteries of extremity with intermittent claudication (HCC) 09/26/2011   CLOSED FRACTURE OF UPPER END OF TIBIA 02/27/2010   TEAR MEDIAL MENISCUS 02/27/2010    ONSET DATE: April 2023  REFERRING DIAG: Dr. Margarita Rana  THERAPY DIAG:  Chronic left shoulder pain  Other symptoms and signs involving the musculoskeletal system  Rationale for Evaluation and Treatment Rehabilitation  SUBJECTIVE:   SUBJECTIVE STATEMENT: S: When I fell I hit the concrete wall.  Pt accompanied by: self  PERTINENT HISTORY: Pt is a 66 y/o male presenting with left shoulder pain s/p fall in April 2023. Pt reports he is doing some exercises that are helping, but continue to have pain with certain movements and is laying on his side. Pt reports he has not had any imaging on his shoulder.   PRECAUTIONS: None  WEIGHT BEARING RESTRICTIONS No  PAIN:  Are you having pain? Yes: NPRS scale: 7/10 Pain location: left shoulder Pain description: aching Aggravating factors: movement, use, lifting Relieving factors: rest,   FALLS: Has patient fallen in last 6 months? Yes. Number of falls 2  PLOF: Independent  PATIENT GOALS To have less pain in the left shoulder and be able to use it.   OBJECTIVE:   HAND DOMINANCE: Left  ADLs: Overall ADLs: Pt reports reaching forward, overhead, behind the back is difficult and he feels pulling. Pt reports difficulty with sleeping, cannot sleep on the left side.    FUNCTIONAL OUTCOME  MEASURES: Quick Dash: 29.55  UPPER EXTREMITY ROM     Active ROM Left eval  Shoulder flexion 150  Shoulder abduction 118  Shoulder internal rotation 90  Shoulder external rotation 60  (Blank rows = not tested)   UPPER EXTREMITY MMT:     MMT Left eval  Shoulder flexion 5/5  Shoulder abduction 4+/5  Shoulder internal rotation 5/5  Shoulder external rotation 4+/5  (Blank rows = not tested)   COGNITION: Overall cognitive status: Within functional limits for tasks assessed  OBSERVATIONS: Hypermobility of joints   TODAY'S TREATMENT:  Eval only    PATIENT EDUCATION: Education details: shoulder A/ROM Person educated: Patient Education method: Programmer, multimedia, Facilities manager, and Handouts Education comprehension: verbalized understanding and returned demonstration   HOME EXERCISE PROGRAM: Eval: shoulder A/ROM  GOALS: Goals reviewed with patient? Yes  SHORT TERM GOALS: Target date: 01/07/2022    Pt will be provided with and educated on HEP to improve mobility required for ADL completion using dominant LUE.   Goal status: INITIAL  2.  Pt will decrease pain to 3/10 or less to improve ability to sleep in preferred position for 2+ consecutive hours or greater.   Goal status: INITIAL  3.  Pt will increase LUE strength to 5/5 throughout LUE to improve ability to perform lifting tasks as needed during meal preparation and housework tasks.   Goal status: INITIAL  4.  Pt will decrease fascial restrictions in LUE to trace amounts to improve mobility required for functional reaching tasks up, behind back.   Goal status: INITIAL  5.  Pt will improve activity tolerance in the LUE required for use as dominant during all ADL and housework or community tasks.   Goal status: INITIAL   ASSESSMENT:  CLINICAL IMPRESSION: Patient is a 66 y.o. male who was seen today for occupational therapy evaluation for left shoulder pain. Pt's pain began in April 2023 after a fall when he  tripped. Pt reports landing against a concrete wall and has had pain since, did improve with exercises but continues to have pain. During evaluation pt demonstrating ROM and strength WFL, however dominant LUE is weaker and has less endurance than the RUE.    PERFORMANCE DEFICITS in functional skills including ADLs, IADLs, ROM, strength, pain, fascial restrictions, muscle spasms, body mechanics, endurance, and UE functional use  IMPAIRMENTS are limiting patient from ADLs, IADLs, rest and sleep, and leisure.   COMORBIDITIES may have co-morbidities  that affects occupational performance. Patient will benefit from skilled OT to address above impairments and improve overall function.  MODIFICATION OR ASSISTANCE TO COMPLETE EVALUATION: No modification of tasks or assist necessary to complete an evaluation.  OT OCCUPATIONAL PROFILE AND HISTORY: Problem focused assessment: Including review of records relating to presenting problem.  CLINICAL DECISION MAKING: LOW - limited treatment options, no task modification necessary  REHAB POTENTIAL: Good  EVALUATION COMPLEXITY: Low      PLAN: OT FREQUENCY: 1x/week  OT DURATION: 4 weeks  PLANNED INTERVENTIONS: self care/ADL training, therapeutic exercise, therapeutic activity, manual therapy, passive range of motion, electrical stimulation, ultrasound, moist heat, patient/family education, and DME and/or AE instructions  CONSULTED AND AGREED WITH PLAN OF CARE: Patient  PLAN FOR NEXT SESSION: follow up on HEP, begin scapular theraband, gentle strengthening of LUE   Ezra Sites, OTR/L  [phone] 12/10/2021, 3:43 PM

## 2021-12-10 NOTE — Patient Instructions (Signed)

## 2021-12-11 ENCOUNTER — Encounter (HOSPITAL_COMMUNITY): Payer: Medicaid Other

## 2021-12-16 ENCOUNTER — Ambulatory Visit (HOSPITAL_COMMUNITY): Payer: Medicaid Other | Admitting: Physical Therapy

## 2021-12-16 DIAGNOSIS — Z9181 History of falling: Secondary | ICD-10-CM

## 2021-12-16 DIAGNOSIS — M5459 Other low back pain: Secondary | ICD-10-CM

## 2021-12-16 NOTE — Therapy (Signed)
OUTPATIENT PHYSICAL THERAPY TREATMENT   Patient Name: Brady Bell MRN: [id number] DOB:[birth year], 66 y.o., male Today's Date: 12/16/2021   PT End of Session - 12/16/21 1517     Visit Number 3    Number of Visits 12    Date for PT Re-Evaluation 01/15/22    Authorization Type Medicaid [place]    Authorization Time Period 3 visits approved 9/11 -9/24    Authorization - Visit Number 2    Authorization - Number of Visits 3    Progress Note Due on Visit 4    PT Start Time 1510    PT Stop Time 1600    PT Time Calculation (min) 50 min    Activity Tolerance Patient limited by pain    Behavior During Therapy Agitated             Past Medical History:  Diagnosis Date   Chronic ankle pain    Chronic knee pain    COPD (chronic obstructive pulmonary disease) (HCC) 09/30/2018   on CT chest   GERD (gastroesophageal reflux disease)    Hepatitis    Hep A in 1980's   History of atrial flutter    Documented April/May 2019 in the setting of pancreatitis   Hypoglycemia    Migraines    Pancreatitis    Peripheral vascular disease (HCC)    Recurrent sinus infections    Seizures (HCC)    Shingles    Past Surgical History:  Procedure Laterality Date   ABDOMINAL AORTOGRAM N/A 04/23/2017   Procedure: ABDOMINAL AORTOGRAM;  Surgeon: Fransisco Hertz, MD;  Location: [facility] INVASIVE CV LAB;  Service: Cardiovascular;  Laterality: N/A;   CHOLECYSTECTOMY N/A 09/23/2017   Procedure: LAPAROSCOPIC CHOLECYSTECTOMY;  Surgeon: Franky Macho, MD;  Location: [facility] ORS;  Service: General;  Laterality: N/A;   ENDARTERECTOMY FEMORAL Left 04/28/2017   Procedure: ENDARTERECTOMY LEFT ILIO-FEMORAL ARTERY;  Surgeon: Fransisco Hertz, MD;  Location: [facility] OR;  Service: Vascular;  Laterality: Left;   FASCIOTOMY  09/06/2011   Procedure: FASCIOTOMY;  Surgeon: Fransisco Hertz, MD;  Location: [facility] OR;  Service: Vascular;  Laterality: Left;   FEMORAL-TIBIAL BYPASS GRAFT  09/06/2011   Procedure: BYPASS GRAFT FEMORAL-TIBIAL ARTERY;  Surgeon:  Fransisco Hertz, MD;  Location: [facility] OR;  Service: Vascular;  Laterality: Left;   KNEE CARTILAGE SURGERY Left    LOWER EXTREMITY ANGIOGRAM N/A 09/05/2011   Procedure: LOWER EXTREMITY ANGIOGRAM;  Surgeon: Sherren Kerns, MD;  Location: [facility] CATH LAB;  Service: Cardiovascular;  Laterality: N/A;   LOWER EXTREMITY ANGIOGRAM Left 04/15/2012   Procedure: LOWER EXTREMITY ANGIOGRAM;  Surgeon: Fransisco Hertz, MD;  Location: [facility] CATH LAB;  Service: Cardiovascular;  Laterality: Left;   LOWER EXTREMITY ANGIOGRAPHY Bilateral 04/23/2017   Procedure: Lower Extremity Angiography;  Surgeon: Fransisco Hertz, MD;  Location: [facility] INVASIVE CV LAB;  Service: Cardiovascular;  Laterality: Bilateral;   PATCH ANGIOPLASTY Left 04/28/2017   Procedure: ILIO-FEMORAL ARTERY PATCH ANGIOPLASTY USING XENOSURE BIOLOGIC PATCH;  Surgeon: Fransisco Hertz, MD;  Location: [facility] OR;  Service: Vascular;  Laterality: Left;   PR VEIN BYPASS GRAFT,AORTO-FEM-POP  09/06/11   Left    TOOTH EXTRACTION  June-July-Aug. 2015   Several    Patient Active Problem List   Diagnosis Date Noted   Chronic alcohol abuse 12/16/2018   Leukocytosis 12/16/2018   Alcohol withdrawal (HCC) 10/02/2018   COPD (chronic obstructive pulmonary disease) (HCC) 09/30/2018   Pancreatitis, acute 04/12/2018   Tobacco abuse 04/12/2018   GERD (gastroesophageal reflux disease)  04/12/2018   Alcohol abuse 04/12/2018   Hx of seizure disorder 04/12/2018   History of atrial flutter 04/12/2018   HTN (hypertension) 04/12/2018   Gallstone pancreatitis    Erythrocytosis 09/10/2017   Calculus of gallbladder with cholecystitis without biliary obstruction    Atrial flutter (HCC) 07/29/2017   Acute gallstone pancreatitis 07/22/2017   Acute alcoholic pancreatitis 07/22/2017   Hypertensive urgency 07/22/2017   Acute pancreatitis 07/22/2017   Accelerated hypertension    Tobacco abuse counseling    PAD (peripheral artery disease) (HCC) 04/28/2017   Chronic migraine 02/11/2017   New daily persistent  headache 12/08/2016   Peripheral vascular disease (HCC) 12/08/2016   Pain in joint, lower leg 02/10/2014   Numbness and tingling of left leg 12/16/2013   Cramps of left lower extremity-Leg 12/16/2013   Aftercare following surgery of the circulatory system, NEC 12/16/2013   PVD (peripheral vascular disease) with claudication (HCC) 05/07/2012   Pain in limb 03/26/2012   Pain, lower leg 12/26/2011   Atherosclerosis of native arteries of extremity with intermittent claudication (HCC) 09/26/2011   CLOSED FRACTURE OF UPPER END OF TIBIA 02/27/2010   TEAR MEDIAL MENISCUS 02/27/2010    PCP: Roe Rutherford, NP  REFERRING PROVIDER: Roe Rutherford, NP  REFERRING DIAG: M54.31 rt sciatic nerve pain and z91.81 hx of recent fall   Rationale for Evaluation and Treatment Rehabilitation  THERAPY DIAG:  No diagnosis found.  ONSET DATE: April 2023 after a fall  SUBJECTIVE:                                                                                                                                                                                           SUBJECTIVE STATEMENT: Patient states he has 6/10 pain today mostly on Right side where his belt hit.    PERTINENT HISTORY:  Reports Lt knee 2 arthroscopy, and popliteal bypass on Lt  PAIN:  Are you having pain? Yes: NPRS scale: 6/10 Pain location: back and RLE Pain description: sharp, spasm, sore Aggravating factors: transfers, standing, walking, lying down Relieving factors: medication   PRECAUTIONS: None  WEIGHT BEARING RESTRICTIONS No  FALLS:  Has patient fallen in last 6 months? Yes. Number of falls 1  LIVING ENVIRONMENT: Lives with: lives with their family and lives alone Lives in: House/apartment Stairs: No Has following equipment at home: Single point cane and Environmental consultant - 2 wheeled  OCCUPATION: disabled  PLOF: Independent  PATIENT GOALS Decrease pain   OBJECTIVE:    PATIENT SURVEYS:  Modified Oswestry  31 / 50 or  62 %   SCREENING FOR RED FLAGS: Bowel or bladder incontinence: No Spinal tumors: No Cauda  equina syndrome: No Compression fracture: No Abdominal aneurysm: No  COGNITION:  Overall cognitive status: Within functional limits for tasks assessed     SENSATION: WFL    POSTURE: No Significant postural limitations  PALPATION: TrPs Rt glute TTP with cPA L4-5  LUMBAR ROM:   Active  A/PROM  eval  Flexion WNL P!  Extension Limited 50% P!  Right lateral flexion Limited 25% P!  Left lateral flexion Limited 25%  Right rotation   Left rotation    (Blank rows = not tested)   LOWER EXTREMITY MMT:    MMT Right eval Left eval  Hip flexion 3+ 4  Hip extension 3 3+  Hip abduction 4- 4  Hip adduction    Hip internal rotation    Hip external rotation    Knee flexion 4 4+  Knee extension 4+ 5  Ankle dorsiflexion 5 5  Ankle plantarflexion    Ankle inversion    Ankle eversion     (Blank rows = not tested)  LUMBAR SPECIAL TESTS:  Straight leg raise test: Positive and Slump test: Positive RT  FUNCTIONAL TESTS:  5 times sit to stand: 27 seconds no hands 2 minute walk test: 304' no AD  GAIT: Distance walked: 100304' Assistive device utilized: None Level of assistance: Modified independence Comments: Antalgic gait pattern, decreased stance time on Rt, good posture, no buckling noted, stops intermittently due to pain    TODAY'S TREATMENT  12/16/21 Standing:  hip excursions 10X each direction  Hip abduction 2X10  Hip extension 2X10 Sitting:  Sit to stand no UE 10X  LAQ 10X each Supine:  bridge 2X10  SLR with ab iso 10X5'  12/10/21 Review of goals and POC  Seated: piriformis stretch 3X20" Supine: ab isometric 10X5" with breathing cues  Bridge 10X  Glute sets 10X5"  SLR with ab set 10X Education:  posture and breathing.   Eval 5x sit to stand 2MWT HEP   PATIENT EDUCATION:  Education details: Findings, symptom awareness, activity modification, modalities as  needed. Person educated: Patient Education method: Explanation Education comprehension: verbalized understanding   HOME EXERCISE PROGRAM: Access code : [email or web address]  Standing hip abduction, extension, hip excursion  Sit to stands, LAQ  Supine: bridge and SLR  Access Code: [email or web address] URL: [email or web address] Date: 12/04/2021 Prepared by: Kathlyn SacramentoLogan Barbour  Exercises - Supine Transversus Abdominis Bracing - Hands on Stomach  - 3 x daily - 7 x weekly - 2 sets - 10 reps - Supine Piriformis Stretch with Foot on Ground  - 3 x daily - 7 x weekly - 2 sets - 10 reps - Prone Press Up On Elbows  - 3 x daily - 7 x weekly - 3 sets - 5 reps  ASSESSMENT:  CLINICAL IMPRESSION: Continued to establish LE strengthening and lumbar ROM HEP.  Pt admits to being mostly compliant with HEP but unable to wear pants without a belt present. Improved posturing today with seated.  Cues to complete within painfree ROM as pushes self at times. Pt hypersensitive to some of the movements/exercise but able recover quickly.  Added therex to HEP this session.  Patient will continue to benefit from skilled physical therapy intervention to address deficits and assist with return to PLOF.   OBJECTIVE IMPAIRMENTS Abnormal gait, decreased activity tolerance, decreased balance, decreased endurance, decreased knowledge of condition, decreased mobility, difficulty walking, decreased ROM, decreased strength, increased fascial restrictions, impaired flexibility, and pain.   ACTIVITY LIMITATIONS carrying, lifting, bending, sitting, standing, squatting, sleeping, stairs, transfers, and bathing  PARTICIPATION LIMITATIONS: cleaning, laundry, driving, shopping, community activity, and yard work  PERSONAL FACTORS Past/current experiences and Time since onset of injury/illness/exacerbation are also affecting patient's functional outcome.   REHAB POTENTIAL: Good  CLINICAL DECISION MAKING: Stable/uncomplicated  EVALUATION  COMPLEXITY: Low   GOALS: Goals reviewed with patient? Yes  SHORT TERM GOALS: Target date: 12/25/21  Patient will be independent with initial HEP and self-management strategies to improve functional outcomes Baseline: Initiated Goal status: IN PROGRESS   2. Patient will demonstrate >4 point reduction with Oswestry Low Back Disability Questionnaire to show improvement in subjective functional level.    Baseline: 31/50 = 62%    Goal Status: IN PROGRESS  LONG TERM GOALS: Target date: 01/15/22  Patient will be independent with advanced HEP and self-management strategies to improve functional outcomes Baseline: n/a Goal status: IN PROGRESS  2.  Patient will improve Oswestry Low Back Disability Questionnaire score by 12 points (MCID) or greater to indicate improvement in functional outcomes Baseline: 31/50 = 62% Goal status: IN PROGRESS  3.  Patient will report reduction of back pain to <3/10 for improved quality of life and ability to perform ADLs  Baseline: 10/10 Goal status: IN PROGRESS  4. Patient will have equal to or > 4+/5 MMT throughout BIL LEs to improve ability to perform functional mobility, stair ambulation and ADLs.  Baseline: See above Goal status: IN PROGRESS  5. Patient will be able to ambulate at least 375 feet during 2MWT with LRAD to demonstrate improved ability to perform functional mobility and associated tasks. Baseline: 306 feet no AD Goal status: IN PROGRESS     PLAN: PT FREQUENCY: 2x/week  PT DURATION: 6 weeks  PLANNED INTERVENTIONS: Therapeutic exercises, Therapeutic activity, Neuromuscular re-education, Balance training, Gait training, Patient/Family education, Self Care, Joint mobilization, Joint manipulation, Stair training, DME instructions, Dry Needling, Spinal manipulation, Spinal mobilization, Cryotherapy, Moist heat, Traction, Manual therapy, and Re-evaluation.  PLAN FOR NEXT SESSION: Progress lumbar flexibility, centralization of symptoms  (RLE radiculopathy,) core strengthening.  Complete reassessment next session.  Teena Irani, PTA/CLT [place] Ph: [phone]   12/16/2021, 3:18 PM

## 2021-12-17 ENCOUNTER — Ambulatory Visit (HOSPITAL_COMMUNITY): Payer: Medicaid Other | Admitting: Occupational Therapy

## 2021-12-17 ENCOUNTER — Encounter (HOSPITAL_COMMUNITY): Payer: Self-pay | Admitting: Occupational Therapy

## 2021-12-17 DIAGNOSIS — R29898 Other symptoms and signs involving the musculoskeletal system: Secondary | ICD-10-CM

## 2021-12-17 DIAGNOSIS — G8929 Other chronic pain: Secondary | ICD-10-CM

## 2021-12-17 DIAGNOSIS — M5459 Other low back pain: Secondary | ICD-10-CM | POA: Diagnosis not present

## 2021-12-17 NOTE — Therapy (Signed)
OUTPATIENT OCCUPATIONAL THERAPY ORTHO EVALUATION  Patient Name: Brady Bell MRN: [id number] DOB:12/26/1955, 66 y.o., male Today's Date: 12/17/2021  PCP: Pablo Lawrence, NP REFERRING PROVIDER: Edmonia Lynch, MD   OT End of Session - 12/17/21 1717     Visit Number 2    Number of Visits 4    Date for OT Re-Evaluation 01/09/22    Authorization Type Medicaid    Authorization Time Period Requesting initial 3 visits    Authorization - Visit Number 1    Authorization - Number of Visits 3    OT Start Time 1430    OT Stop Time 1512    OT Time Calculation (min) 42 min    Activity Tolerance Patient tolerated treatment well    Behavior During Therapy [facility] for tasks assessed/performed              Past Medical History:  Diagnosis Date   Chronic ankle pain    Chronic knee pain    COPD (chronic obstructive pulmonary disease) ([place]) 09/30/2018   on CT chest   GERD (gastroesophageal reflux disease)    Hepatitis    Hep A in 1980's   History of atrial flutter    Documented April/May 2019 in the setting of pancreatitis   Hypoglycemia    Migraines    Pancreatitis    Peripheral vascular disease (HCC)    Recurrent sinus infections    Seizures ([place])    Shingles    Past Surgical History:  Procedure Laterality Date   ABDOMINAL AORTOGRAM N/A 04/23/2017   Procedure: ABDOMINAL AORTOGRAM;  Surgeon: Conrad Dundarrach, MD;  Location: [place] [facility];  Service: Cardiovascular;  Laterality: N/A;   CHOLECYSTECTOMY N/A 09/23/2017   Procedure: LAPAROSCOPIC CHOLECYSTECTOMY;  Surgeon: Aviva Signs, MD;  Location: [facility];  Service: General;  Laterality: N/A;   ENDARTERECTOMY FEMORAL Left 04/28/2017   Procedure: ENDARTERECTOMY LEFT ILIO-FEMORAL ARTERY;  Surgeon: Conrad [place], MD;  Location: [place];  Service: Vascular;  Laterality: Left;   FASCIOTOMY  09/06/2011   Procedure: FASCIOTOMY;  Surgeon: Conrad [place], MD;  Location: [place];  Service: Vascular;  Laterality: Left;   FEMORAL-TIBIAL BYPASS GRAFT   09/06/2011   Procedure: BYPASS GRAFT FEMORAL-TIBIAL ARTERY;  Surgeon: Conrad [place], MD;  Location: [place];  Service: Vascular;  Laterality: Left;   KNEE CARTILAGE SURGERY Left    LOWER EXTREMITY ANGIOGRAM N/A 09/05/2011   Procedure: LOWER EXTREMITY ANGIOGRAM;  Surgeon: Elam Dutch, MD;  Location: [facility] CATH LAB;  Service: Cardiovascular;  Laterality: N/A;   LOWER EXTREMITY ANGIOGRAM Left 04/15/2012   Procedure: LOWER EXTREMITY ANGIOGRAM;  Surgeon: Conrad [place], MD;  Location: [facility] CATH LAB;  Service: Cardiovascular;  Laterality: Left;   LOWER EXTREMITY ANGIOGRAPHY Bilateral 04/23/2017   Procedure: Lower Extremity Angiography;  Surgeon: Conrad [place], MD;  Location: [place] [facility];  Service: Cardiovascular;  Laterality: Bilateral;   PATCH ANGIOPLASTY Left 04/28/2017   Procedure: ILIO-FEMORAL ARTERY PATCH ANGIOPLASTY USING XENOSURE BIOLOGIC PATCH;  Surgeon: Conrad [place], MD;  Location: [place];  Service: Vascular;  Laterality: Left;   PR VEIN BYPASS GRAFT,AORTO-FEM-POP  09/06/11   Left    TOOTH EXTRACTION  June-July-Aug. 2015   Several    Patient Active Problem List   Diagnosis Date Noted   Chronic alcohol abuse 12/16/2018   Leukocytosis 12/16/2018   Alcohol withdrawal ([place]) 10/02/2018   COPD (chronic obstructive pulmonary disease) ([place]) 09/30/2018   Pancreatitis, acute 04/12/2018   Tobacco abuse 04/12/2018   GERD (  gastroesophageal reflux disease) 04/12/2018   Alcohol abuse 04/12/2018   Hx of seizure disorder 04/12/2018   History of atrial flutter 04/12/2018   HTN (hypertension) 04/12/2018   Gallstone pancreatitis    Erythrocytosis 09/10/2017   Calculus of gallbladder with cholecystitis without biliary obstruction    Atrial flutter ([place]) 07/29/2017   Acute gallstone pancreatitis 123XX123   Acute alcoholic pancreatitis 123XX123   Hypertensive urgency 07/22/2017   Acute pancreatitis 07/22/2017   Accelerated hypertension    Tobacco abuse counseling    PAD (peripheral artery disease)  ([place]) 04/28/2017   Chronic migraine 02/11/2017   New daily persistent headache 12/08/2016   Peripheral vascular disease ([place]) 12/08/2016   Pain in joint, lower leg 02/10/2014   Numbness and tingling of left leg 12/16/2013   Cramps of left lower extremity-Leg 12/16/2013   Aftercare following surgery of the circulatory system, NEC 12/16/2013   PVD (peripheral vascular disease) with claudication ([place]) 05/07/2012   Pain in limb 03/26/2012   Pain, lower leg 12/26/2011   Atherosclerosis of native arteries of extremity with intermittent claudication ([place]) 09/26/2011   CLOSED FRACTURE OF UPPER END OF TIBIA 02/27/2010   TEAR MEDIAL MENISCUS 02/27/2010    ONSET DATE: April 2023  REFERRING DIAG: Dr. Edmonia Lynch  THERAPY DIAG:  Chronic left shoulder pain  Other symptoms and signs involving the musculoskeletal system  Rationale for Evaluation and Treatment Rehabilitation  SUBJECTIVE:   SUBJECTIVE STATEMENT: S: "My bicep muscle just looks strange and its achy up into my shoulder.   PRECAUTIONS: None  WEIGHT BEARING RESTRICTIONS No  PAIN:  Are you having pain? Yes: NPRS scale: 3/10 Pain location: left shoulder Pain description: aching Aggravating factors: movement, use, lifting Relieving factors: rest   OBJECTIVE:   FUNCTIONAL OUTCOME MEASURES: Quick Dash: 29.55  UPPER EXTREMITY ROM     Active ROM Left eval  Shoulder flexion 150  Shoulder abduction 118  Shoulder internal rotation 90  Shoulder external rotation 60  (Blank rows = not tested)   UPPER EXTREMITY MMT:     MMT Left eval  Shoulder flexion 5/5  Shoulder abduction 4+/5  Shoulder internal rotation 5/5  Shoulder external rotation 4+/5  (Blank rows = not tested)   OBSERVATIONS: Hypermobility of joints   TODAY'S TREATMENT:  12/17/21 -Manual Therapy: Myofascial release and trigger point on L biceps, pectoralis, axillary region, and trapezius to address fascial restrictions, improve ROM and reduce pain.   -A/ROM: seated, flexion, abduction, horizontal abduction, er/IR, protraction 1x10 -Wall Washing: flexion, abduction 10x10 sec hold -stretches: bicep, pectoralis 3x20" -scapular strengthening: extension, rows, retraction, protraction, red theraband, 1x10 -strengthening: tricep extensions, red theraband, 1x10   PATIENT EDUCATION: Education details: English as a second language teacher Person educated: Patient Education method: Explanation, Demonstration, and Handouts Education comprehension: verbalized understanding and returned demonstration   HOME EXERCISE PROGRAM: Eval: shoulder A/ROM 9/19: scapular strengthening  GOALS: Goals reviewed with patient? Yes  SHORT TERM GOALS: Target date: 01/14/2022    Pt will be provided with and educated on HEP to improve mobility required for ADL completion using dominant LUE.   Goal status: IN PROGRESS  2.  Pt will decrease pain to 3/10 or less to improve ability to sleep in preferred position for 2+ consecutive hours or greater.   Goal status: IN PROGRESS  3.  Pt will increase LUE strength to 5/5 throughout LUE to improve ability to perform lifting tasks as needed during meal preparation and housework tasks.   Goal status: IN PROGRESS  4.  Pt will decrease fascial restrictions in  LUE to trace amounts to improve mobility required for functional reaching tasks up, behind back.   Goal status: IN PROGRESS  5.  Pt will improve activity tolerance in the LUE required for use as dominant during all ADL and housework or community tasks.   Goal status: IN PROGRESS   ASSESSMENT:  CLINICAL IMPRESSION: Pt reports that his pain has been reduced in his LUE, however his bicep, trapezius, and axillary region continue to have moderate fascial restrictions. He tolerated manual therapy, with minimal soreness noted around fascial restrictions. Therapist provided education to limit bicep activation, and focus on extension based exercises. Pt able to complete scapular  strengthening this session, with therapist providing moderate verbal and tactile cuing for compensation, shoulder hiking, and proper mechanics. Pt demonstrates good endurance throughout activities, only requiring 1 short rest break throughout the session.   PLAN: OT FREQUENCY: 1x/week  OT DURATION: 4 weeks  PLANNED INTERVENTIONS: self care/ADL training, therapeutic exercise, therapeutic activity, manual therapy, passive range of motion, electrical stimulation, ultrasound, moist heat, patient/family education, and DME and/or AE instructions  CONSULTED AND AGREED WITH PLAN OF CARE: Patient  PLAN FOR NEXT SESSION: follow up on HEP, scapular theraband, gentle strengthening of [place], OTR/L [phone] 12/17/2021, 5:19 PM

## 2021-12-17 NOTE — Patient Instructions (Signed)

## 2021-12-23 ENCOUNTER — Encounter (HOSPITAL_COMMUNITY): Payer: Self-pay | Admitting: Occupational Therapy

## 2021-12-23 ENCOUNTER — Ambulatory Visit (HOSPITAL_COMMUNITY): Payer: Medicaid Other | Admitting: Occupational Therapy

## 2021-12-23 DIAGNOSIS — M5459 Other low back pain: Secondary | ICD-10-CM | POA: Diagnosis not present

## 2021-12-23 DIAGNOSIS — G8929 Other chronic pain: Secondary | ICD-10-CM

## 2021-12-23 DIAGNOSIS — R29898 Other symptoms and signs involving the musculoskeletal system: Secondary | ICD-10-CM

## 2021-12-23 NOTE — Therapy (Signed)
OUTPATIENT OCCUPATIONAL THERAPY ORTHO TREATMENT  Patient Name: Brady Bell MRN: [id number] DOB:08-Nov-1955, 66 y.o., male Today's Date: 12/23/2021  PCP: Roe Rutherford, NP REFERRING PROVIDER: Margarita Rana, MD   OT End of Session - 12/23/21 1529     Visit Number 3    Number of Visits 4    Date for OT Re-Evaluation 01/09/22    Authorization Type Medicaid    Authorization Time Period 3 visits approved 9/19-10/16/23    Authorization - Visit Number 2    Authorization - Number of Visits 3    OT Start Time 1358    OT Stop Time 1429    OT Time Calculation (min) 31 min    Activity Tolerance Patient tolerated treatment well    Behavior During Therapy [facility] for tasks assessed/performed               Past Medical History:  Diagnosis Date   Chronic ankle pain    Chronic knee pain    COPD (chronic obstructive pulmonary disease) (HCC) 09/30/2018   on CT chest   GERD (gastroesophageal reflux disease)    Hepatitis    Hep A in 1980's   History of atrial flutter    Documented April/May 2019 in the setting of pancreatitis   Hypoglycemia    Migraines    Pancreatitis    Peripheral vascular disease (HCC)    Recurrent sinus infections    Seizures (HCC)    Shingles    Past Surgical History:  Procedure Laterality Date   ABDOMINAL AORTOGRAM N/A 04/23/2017   Procedure: ABDOMINAL AORTOGRAM;  Surgeon: Fransisco Hertz, MD;  Location: [facility] INVASIVE CV LAB;  Service: Cardiovascular;  Laterality: N/A;   CHOLECYSTECTOMY N/A 09/23/2017   Procedure: LAPAROSCOPIC CHOLECYSTECTOMY;  Surgeon: Franky Macho, MD;  Location: [facility];  Service: General;  Laterality: N/A;   ENDARTERECTOMY FEMORAL Left 04/28/2017   Procedure: ENDARTERECTOMY LEFT ILIO-FEMORAL ARTERY;  Surgeon: Fransisco Hertz, MD;  Location: [facility] OR;  Service: Vascular;  Laterality: Left;   FASCIOTOMY  09/06/2011   Procedure: FASCIOTOMY;  Surgeon: Fransisco Hertz, MD;  Location: [facility] OR;  Service: Vascular;  Laterality: Left;   FEMORAL-TIBIAL BYPASS  GRAFT  09/06/2011   Procedure: BYPASS GRAFT FEMORAL-TIBIAL ARTERY;  Surgeon: Fransisco Hertz, MD;  Location: [facility] OR;  Service: Vascular;  Laterality: Left;   KNEE CARTILAGE SURGERY Left    LOWER EXTREMITY ANGIOGRAM N/A 09/05/2011   Procedure: LOWER EXTREMITY ANGIOGRAM;  Surgeon: Sherren Kerns, MD;  Location: [facility] CATH LAB;  Service: Cardiovascular;  Laterality: N/A;   LOWER EXTREMITY ANGIOGRAM Left 04/15/2012   Procedure: LOWER EXTREMITY ANGIOGRAM;  Surgeon: Fransisco Hertz, MD;  Location: [facility] CATH LAB;  Service: Cardiovascular;  Laterality: Left;   LOWER EXTREMITY ANGIOGRAPHY Bilateral 04/23/2017   Procedure: Lower Extremity Angiography;  Surgeon: Fransisco Hertz, MD;  Location: [facility] INVASIVE CV LAB;  Service: Cardiovascular;  Laterality: Bilateral;   PATCH ANGIOPLASTY Left 04/28/2017   Procedure: ILIO-FEMORAL ARTERY PATCH ANGIOPLASTY USING XENOSURE BIOLOGIC PATCH;  Surgeon: Fransisco Hertz, MD;  Location: [facility] OR;  Service: Vascular;  Laterality: Left;   PR VEIN BYPASS GRAFT,AORTO-FEM-POP  09/06/11   Left    TOOTH EXTRACTION  June-July-Aug. 2015   Several    Patient Active Problem List   Diagnosis Date Noted   Chronic alcohol abuse 12/16/2018   Leukocytosis 12/16/2018   Alcohol withdrawal (HCC) 10/02/2018   COPD (chronic obstructive pulmonary disease) (HCC) 09/30/2018   Pancreatitis, acute 04/12/2018   Tobacco abuse 04/12/2018  GERD (gastroesophageal reflux disease) 04/12/2018   Alcohol abuse 04/12/2018   Hx of seizure disorder 04/12/2018   History of atrial flutter 04/12/2018   HTN (hypertension) 04/12/2018   Gallstone pancreatitis    Erythrocytosis 09/10/2017   Calculus of gallbladder with cholecystitis without biliary obstruction    Atrial flutter (HCC) 07/29/2017   Acute gallstone pancreatitis 07/22/2017   Acute alcoholic pancreatitis 07/22/2017   Hypertensive urgency 07/22/2017   Acute pancreatitis 07/22/2017   Accelerated hypertension    Tobacco abuse counseling    PAD (peripheral artery  disease) (HCC) 04/28/2017   Chronic migraine 02/11/2017   New daily persistent headache 12/08/2016   Peripheral vascular disease (HCC) 12/08/2016   Pain in joint, lower leg 02/10/2014   Numbness and tingling of left leg 12/16/2013   Cramps of left lower extremity-Leg 12/16/2013   Aftercare following surgery of the circulatory system, NEC 12/16/2013   PVD (peripheral vascular disease) with claudication (HCC) 05/07/2012   Pain in limb 03/26/2012   Pain, lower leg 12/26/2011   Atherosclerosis of native arteries of extremity with intermittent claudication (HCC) 09/26/2011   CLOSED FRACTURE OF UPPER END OF TIBIA 02/27/2010   TEAR MEDIAL MENISCUS 02/27/2010    ONSET DATE: April 2023  REFERRING DIAG: Dr. Margarita Rana  THERAPY DIAG:  Chronic left shoulder pain  Other symptoms and signs involving the musculoskeletal system  Rationale for Evaluation and Treatment Rehabilitation  SUBJECTIVE:   SUBJECTIVE STATEMENT: S: "It's really been a buger today."   PRECAUTIONS: None  WEIGHT BEARING RESTRICTIONS No  PAIN:  Are you having pain? Yes: NPRS scale: 7/10 Pain location: left shoulder Pain description: aching Aggravating factors: movement, use, lifting Relieving factors: rest   OBJECTIVE:   FUNCTIONAL OUTCOME MEASURES: Quick Dash: 29.55  UPPER EXTREMITY ROM     Active ROM Left eval  Shoulder flexion 150  Shoulder abduction 118  Shoulder internal rotation 90  Shoulder external rotation 60  (Blank rows = not tested)   UPPER EXTREMITY MMT:     MMT Left eval  Shoulder flexion 5/5  Shoulder abduction 4+/5  Shoulder internal rotation 5/5  Shoulder external rotation 4+/5  (Blank rows = not tested)   OBSERVATIONS: Hypermobility of joints   TODAY'S TREATMENT:   12/23/21 -Strengthening: supine-protraction, flexion, horizontal abduction, er/IR, 2#; abduction 1#; 10 reps each -Proximal shoulder strengthening: supine-paddles, criss cross, circles each direction, 10  reps each using 2# -Therapy ball strengthening: coral ball-chest press, flexion, circles each direction, 10 reps each  -Overhead lacing: seated, lacing from top down then removing -Scapular theraband: green band, row, extension, retraction, 10 reps each -Ball on wall: 1' flexion   12/17/21 -Manual Therapy: Myofascial release and trigger point on L biceps, pectoralis, axillary region, and trapezius to address fascial restrictions, improve ROM and reduce pain.  -A/ROM: seated, flexion, abduction, horizontal abduction, er/IR, protraction 1x10 -Wall Washing: flexion, abduction 10x10 sec hold -stretches: bicep, pectoralis 3x20" -scapular strengthening: extension, rows, retraction, protraction, red theraband, 1x10 -strengthening: tricep extensions, red theraband, 1x10   PATIENT EDUCATION: Education details: reviewed HEP and discussed  Person educated: Patient Education method: Explanation, Demonstration, and Handouts Education comprehension: verbalized understanding and returned demonstration   HOME EXERCISE PROGRAM: Eval: shoulder A/ROM 9/19: scapular strengthening  GOALS: Goals reviewed with patient? Yes  SHORT TERM GOALS: Target date: 01/20/2022    Pt will be provided with and educated on HEP to improve mobility required for ADL completion using dominant LUE.   Goal status: IN PROGRESS  2.  Pt will decrease pain to  3/10 or less to improve ability to sleep in preferred position for 2+ consecutive hours or greater.   Goal status: IN PROGRESS  3.  Pt will increase LUE strength to 5/5 throughout LUE to improve ability to perform lifting tasks as needed during meal preparation and housework tasks.   Goal status: IN PROGRESS  4.  Pt will decrease fascial restrictions in LUE to trace amounts to improve mobility required for functional reaching tasks up, behind back.   Goal status: IN PROGRESS  5.  Pt will improve activity tolerance in the LUE required for use as dominant during  all ADL and housework or community tasks.   Goal status: IN PROGRESS   ASSESSMENT:  CLINICAL IMPRESSION: Pt reports that his pain is increased a little today, not sure why. Reports HEP is going well. Pt with trace fascial restrictions today, ROM is full. Progressed to strengthening in supine and standing, focusing on form and technique, using steady speed without rushing. Pt completing therapy ball strengthening, ball on wall, and overhead lacing. Pt reports mod fatigue at end of session, states he can feel the muscles stretching and activating during exercises.   PLAN: OT FREQUENCY: 1x/week  OT DURATION: 4 weeks  PLANNED INTERVENTIONS: self care/ADL training, therapeutic exercise, therapeutic activity, manual therapy, passive range of motion, electrical stimulation, ultrasound, moist heat, patient/family education, and DME and/or AE instructions  CONSULTED AND AGREED WITH PLAN OF CARE: Patient  PLAN FOR NEXT SESSION: last approved visit-reassess. gentle strengthening of LUE, scapular strengthening     Guadelupe Sabin, OTR/L  [phone] 12/23/2021, 3:31 PM

## 2021-12-25 ENCOUNTER — Ambulatory Visit (HOSPITAL_COMMUNITY): Payer: Medicaid Other | Admitting: Physical Therapy

## 2021-12-25 DIAGNOSIS — M5459 Other low back pain: Secondary | ICD-10-CM

## 2021-12-25 DIAGNOSIS — Z9181 History of falling: Secondary | ICD-10-CM

## 2021-12-25 NOTE — Therapy (Signed)
OUTPATIENT PHYSICAL THERAPY TREATMENT   Patient Name: Brady Bell MRN: [id number] DOB:Sep 04, 1955, 66 y.o., male Today's Date: 12/25/2021   PT End of Session - 12/25/21 0949     Visit Number 4    Number of Visits 16    Date for PT Re-Evaluation 01/15/22    Authorization Type Medicaid [place]    Authorization Time Period 3 visits approved 9/11 -9/24    Authorization - Visit Number 3 requested 12 more visits    Authorization - Number of Visits 3    Progress Note Due on Visit 15   PT Start Time 0900    PT Stop Time 0945    PT Time Calculation (min) 45 min    Activity Tolerance Patient limited by pain    Behavior During Therapy Agitated              Past Medical History:  Diagnosis Date   Chronic ankle pain    Chronic knee pain    COPD (chronic obstructive pulmonary disease) (HCC) 09/30/2018   on CT chest   GERD (gastroesophageal reflux disease)    Hepatitis    Hep A in 1980's   History of atrial flutter    Documented April/May 2019 in the setting of pancreatitis   Hypoglycemia    Migraines    Pancreatitis    Peripheral vascular disease (HCC)    Recurrent sinus infections    Seizures (HCC)    Shingles    Past Surgical History:  Procedure Laterality Date   ABDOMINAL AORTOGRAM N/A 04/23/2017   Procedure: ABDOMINAL AORTOGRAM;  Surgeon: Fransisco Hertz, MD;  Location: [facility] INVASIVE CV LAB;  Service: Cardiovascular;  Laterality: N/A;   CHOLECYSTECTOMY N/A 09/23/2017   Procedure: LAPAROSCOPIC CHOLECYSTECTOMY;  Surgeon: Franky Macho, MD;  Location: [facility] OR[facility];  Service: General;  Laterality: N/A;   ENDARTERECTOMY FEMORAL Left 04/28/2017   Procedure: ENDARTERECTOMY LEFT ILIO-FEMORAL ARTERY;  Surgeon: Fransisco Hertz, MD;  Location: [facility] OR;  Service: Vascular;  Laterality: Left;   FASCIOTOMY  09/06/2011   Procedure: FASCIOTOMY;  Surgeon: Fransisco Hertz, MD;  Location: [facility] OR;  Service: Vascular;  Laterality: Left;   FEMORAL-TIBIAL BYPASS GRAFT  09/06/2011   Procedure: BYPASS GRAFT  FEMORAL-TIBIAL ARTERY;  Surgeon: Fransisco Hertz, MD;  Location: [facility] OR;  Service: Vascular;  Laterality: Left;   KNEE CARTILAGE SURGERY Left    LOWER EXTREMITY ANGIOGRAM N/A 09/05/2011   Procedure: LOWER EXTREMITY ANGIOGRAM;  Surgeon: Sherren Kerns, MD;  Location: [facility] CATH LAB;  Service: Cardiovascular;  Laterality: N/A;   LOWER EXTREMITY ANGIOGRAM Left 04/15/2012   Procedure: LOWER EXTREMITY ANGIOGRAM;  Surgeon: Fransisco Hertz, MD;  Location: [facility] CATH LAB;  Service: Cardiovascular;  Laterality: Left;   LOWER EXTREMITY ANGIOGRAPHY Bilateral 04/23/2017   Procedure: Lower Extremity Angiography;  Surgeon: Fransisco Hertz, MD;  Location: [facility] INVASIVE CV LAB;  Service: Cardiovascular;  Laterality: Bilateral;   PATCH ANGIOPLASTY Left 04/28/2017   Procedure: ILIO-FEMORAL ARTERY PATCH ANGIOPLASTY USING XENOSURE BIOLOGIC PATCH;  Surgeon: Fransisco Hertz, MD;  Location: [facility] OR;  Service: Vascular;  Laterality: Left;   PR VEIN BYPASS GRAFT,AORTO-FEM-POP  09/06/11   Left    TOOTH EXTRACTION  June-July-Aug. 2015   Several    Patient Active Problem List   Diagnosis Date Noted   Chronic alcohol abuse 12/16/2018   Leukocytosis 12/16/2018   Alcohol withdrawal (HCC) 10/02/2018   COPD (chronic obstructive pulmonary disease) (HCC) 09/30/2018   Pancreatitis, acute 04/12/2018   Tobacco abuse 04/12/2018  GERD (gastroesophageal reflux disease) 04/12/2018   Alcohol abuse 04/12/2018   Hx of seizure disorder 04/12/2018   History of atrial flutter 04/12/2018   HTN (hypertension) 04/12/2018   Gallstone pancreatitis    Erythrocytosis 09/10/2017   Calculus of gallbladder with cholecystitis without biliary obstruction    Atrial flutter (HCC) 07/29/2017   Acute gallstone pancreatitis 07/22/2017   Acute alcoholic pancreatitis 07/22/2017   Hypertensive urgency 07/22/2017   Acute pancreatitis 07/22/2017   Accelerated hypertension    Tobacco abuse counseling    PAD (peripheral artery disease) (HCC) 04/28/2017   Chronic migraine  02/11/2017   New daily persistent headache 12/08/2016   Peripheral vascular disease (HCC) 12/08/2016   Pain in joint, lower leg 02/10/2014   Numbness and tingling of left leg 12/16/2013   Cramps of left lower extremity-Leg 12/16/2013   Aftercare following surgery of the circulatory system, NEC 12/16/2013   PVD (peripheral vascular disease) with claudication (HCC) 05/07/2012   Pain in limb 03/26/2012   Pain, lower leg 12/26/2011   Atherosclerosis of native arteries of extremity with intermittent claudication (HCC) 09/26/2011   CLOSED FRACTURE OF UPPER END OF TIBIA 02/27/2010   TEAR MEDIAL MENISCUS 02/27/2010    PCP: Roe Rutherford, NP  REFERRING PROVIDER: Roe Rutherford, NP  REFERRING DIAG: M54.31 rt sciatic nerve pain and z91.81 hx of recent fall   Rationale for Evaluation and Treatment Rehabilitation  THERAPY DIAG:  Other low back pain  History of falling  ONSET DATE: April 2023 after a fall  SUBJECTIVE:                                                                                                                                                                                           SUBJECTIVE STATEMENT: Patient states he has 8/10 pain today mostly on Right side .  Pt states the exercises seem to be making him worse. States that he has a lot going on at home.    PERTINENT HISTORY:  Reports Lt knee 2 arthroscopy, and popliteal bypass on Lt  PAIN:  Are you having pain? Yes: NPRS scale: 6/10 Pain location: back and RLE Pain description: sharp, spasm, sore Aggravating factors: transfers, standing, walking, lying down Relieving factors: medication    PLOF: Independent  PATIENT GOALS Decrease pain   OBJECTIVE:    POSTURE: No Significant postural limitations  PALPATION: TrPs Rt glute TTP with cPA L4-5  LUMBAR ROM:   Active  A/PROM  eval  Flexion WNL P!  Extension Limited 50% P!  Right lateral flexion Limited 25% P!  Left lateral flexion Limited 25%   Right rotation   Left rotation    (  Blank rows = not tested)   LOWER EXTREMITY MMT:    MMT Right eval Left eval  Hip flexion 3+ 4  Hip extension 3 3+  Hip abduction 4- 4  Hip adduction    Hip internal rotation    Hip external rotation    Knee flexion 4 4+  Knee extension 4+ 5  Ankle dorsiflexion 5 5  Ankle plantarflexion    Ankle inversion    Ankle eversion     (Blank rows = not tested)  LUMBAR SPECIAL TESTS:  Straight leg raise test: Positive and Slump test: Positive RT  FUNCTIONAL TESTS:  5 times sit to stand: 27 seconds no hands 2 minute walk test: 304' no AD  GAIT: Distance walked: 84304' Assistive device utilized: None Level of assistance: Modified independence Comments: Antalgic gait pattern, decreased stance time on Rt, good posture, no buckling noted, stops intermittently due to pain    TODAY'S TREATMENT                         12/25/21                        Standing:                        Heel raises x 10                        Functional squat x 10                         Hip abduction x 15                        Hip extension x 15                         Hip excursion x 3                        Supine:                         Knee to chest x 3 30"                        Active hamstring stretch x 3 30"                        Prone:                         POE:  attempted but unable to tolerate                         Prone with glut sets with 2 pillows under abdomen.                         Sit:                        Sit to stand x 10                         12/16/21 Standing:  hip excursions 10X  each direction  Hip abduction 2X10  Hip extension 2X10 Sitting:  Sit to stand no UE 10X  LAQ 10X each Supine:  bridge 2X10  SLR with ab iso 10X5'   12/10/21 Review of goals and POC  Seated: piriformis stretch 3X20" Supine: ab isometric 10X5" with breathing cues  Bridge 10X  Glute sets 10X5"  SLR with ab set 10X Education:  posture and  breathing.   PATIENT EDUCATION:  Education details: Findings, symptom awareness, activity modification, modalities as needed. Person educated: Patient Education method: Explanation Education comprehension: verbalized understanding   HOME EXERCISE PROGRAM:              9/27:  knee to chest and hamstring stretch x 3 @  Access code : [email or web address]  Standing hip abduction, extension, hip excursion  Sit to stands, LAQ  Supine: bridge and SLR  Access Code: [email or web address] URL: [email or web address] Date: 12/04/2021 Prepared by: Candie Mile  Exercises - Supine Transversus Abdominis Bracing - Hands on Stomach  - 3 x daily - 7 x weekly - 2 sets - 10 reps - Supine Piriformis Stretch with Foot on Ground  - 3 x daily - 7 x weekly - 2 sets - 10 reps - Prone Press Up On Elbows  - 3 x daily - 7 x weekly - 3 sets - 5 reps  ASSESSMENT:  CLINICAL IMPRESSION: Pt uses sit up motion to get to supine position and reverse sit up to get to sitting.  Pt does this repeatedly despite therapist explaining that this is stressful to the back.  PT is very jumpy throughout session.  Therapist attempts to limit stretches to just a stretch but pt goes to extreme and then complains that it is painful.  PT attempts isometric but again pt complains of increased pain.  Continued to establish LE strengthening and lumbar ROM HEP.   Patient will continue to benefit from skilled physical therapy intervention to address deficits and assist with return to [facility], therapist requested 12 additional  visits.    OBJECTIVE IMPAIRMENTS Abnormal gait, decreased activity tolerance, decreased balance, decreased endurance, decreased knowledge of condition, decreased mobility, difficulty walking, decreased ROM, decreased strength, increased fascial restrictions, impaired flexibility, and pain.   ACTIVITY LIMITATIONS carrying, lifting, bending, sitting, standing, squatting, sleeping, stairs, transfers, and bathing  PARTICIPATION  LIMITATIONS: cleaning, laundry, driving, shopping, community activity, and yard work  PERSONAL FACTORS Past/current experiences and Time since onset of injury/illness/exacerbation are also affecting patient's functional outcome.   REHAB POTENTIAL: Good  CLINICAL DECISION MAKING: Stable/uncomplicated  EVALUATION COMPLEXITY: Low   GOALS: Goals reviewed with patient? Yes  SHORT TERM GOALS: Target date: 12/25/21  Patient will be independent with initial HEP and self-management strategies to improve functional outcomes Baseline: Initiated Goal status: IN PROGRESS   2. Patient will demonstrate >4 point reduction with Oswestry Low Back Disability Questionnaire to show improvement in subjective functional level.    Baseline: 31/50 = 62%    Goal Status: IN PROGRESS  LONG TERM GOALS: Target date: 01/15/22  Patient will be independent with advanced HEP and self-management strategies to improve functional outcomes Baseline: n/a Goal status: IN PROGRESS  2.  Patient will improve Oswestry Low Back Disability Questionnaire score by 12 points (MCID) or greater to indicate improvement in functional outcomes Baseline: 31/50 = 62% Goal status: IN PROGRESS  3.  Patient will report reduction of back pain to <3/10 for improved quality of life and ability to perform ADLs  Baseline: 10/10 Goal status: IN PROGRESS  4. Patient  will have equal to or > 4+/5 MMT throughout BIL LEs to improve ability to perform functional mobility, stair ambulation and ADLs.  Baseline: See above Goal status: IN PROGRESS  5. Patient will be able to ambulate at least 375 feet during with LRAD to demonstrate improved ability to perform functional mobility and associated tasks. Baseline: 306 feet no AD Goal status: IN PROGRESS     PLAN: PT FREQUENCY: 2x/week  PT DURATION: 6 weeks  PLANNED INTERVENTIONS: Therapeutic exercises, Therapeutic activity, Neuromuscular re-education, Balance training, Gait training,  Patient/Family education, Self Care, Joint mobilization, Joint manipulation, Stair training, DME instructions, Dry Needling, Spinal manipulation, Spinal mobilization, Cryotherapy, Moist heat, Traction, Manual therapy, and Re-evaluation.  PLAN FOR NEXT SESSION: Progress lumbar flexibility, centralization of symptoms (RLE radiculopathy,) core strengthening.  Attempt to see if pt has a bias for flexion or extension based exercises.   Virgina Organ, PT CLT [phone]   12/25/2021, 9:46 AM

## 2021-12-30 ENCOUNTER — Ambulatory Visit (HOSPITAL_COMMUNITY): Payer: Medicaid Other | Attending: Adult Health Nurse Practitioner | Admitting: Occupational Therapy

## 2021-12-30 ENCOUNTER — Encounter (HOSPITAL_COMMUNITY): Payer: Self-pay | Admitting: Occupational Therapy

## 2021-12-30 DIAGNOSIS — Z9181 History of falling: Secondary | ICD-10-CM | POA: Insufficient documentation

## 2021-12-30 DIAGNOSIS — G8929 Other chronic pain: Secondary | ICD-10-CM | POA: Insufficient documentation

## 2021-12-30 DIAGNOSIS — M6281 Muscle weakness (generalized): Secondary | ICD-10-CM | POA: Insufficient documentation

## 2021-12-30 DIAGNOSIS — M545 Low back pain, unspecified: Secondary | ICD-10-CM | POA: Insufficient documentation

## 2021-12-30 DIAGNOSIS — R29898 Other symptoms and signs involving the musculoskeletal system: Secondary | ICD-10-CM | POA: Insufficient documentation

## 2021-12-30 DIAGNOSIS — M25512 Pain in left shoulder: Secondary | ICD-10-CM | POA: Diagnosis present

## 2021-12-30 DIAGNOSIS — M5459 Other low back pain: Secondary | ICD-10-CM | POA: Diagnosis present

## 2021-12-30 DIAGNOSIS — R262 Difficulty in walking, not elsewhere classified: Secondary | ICD-10-CM | POA: Diagnosis present

## 2021-12-30 NOTE — Therapy (Signed)
OUTPATIENT OCCUPATIONAL THERAPY ORTHO REASSESSMENT, TREATMENT DISCHARGE SUMMARY  Patient Name: Brady Bell MRN: [id number] DOB:05-18-1955, 66 y.o., male Today's Date: 12/30/2021  PCP: Pablo Lawrence, NP REFERRING PROVIDER: Edmonia Lynch, MD   OT End of Session - 12/30/21 1107     Visit Number 4    Number of Visits 4    Date for OT Re-Evaluation 01/09/22    Authorization Type Medicaid    Authorization Time Period 3 visits approved 9/19-10/16/23    Authorization - Visit Number 3    Authorization - Number of Visits 3    OT Start Time 1033    OT Stop Time 1107    OT Time Calculation (min) 34 min    Activity Tolerance Patient tolerated treatment well    Behavior During Therapy [facility] for tasks assessed/performed                Past Medical History:  Diagnosis Date   Chronic ankle pain    Chronic knee pain    COPD (chronic obstructive pulmonary disease) ([place]) 09/30/2018   on CT chest   GERD (gastroesophageal reflux disease)    Hepatitis    Hep A in 1980's   History of atrial flutter    Documented April/May 2019 in the setting of pancreatitis   Hypoglycemia    Migraines    Pancreatitis    Peripheral vascular disease ([place])    Recurrent sinus infections    Seizures ([place])    Shingles    Past Surgical History:  Procedure Laterality Date   ABDOMINAL AORTOGRAM N/A 04/23/2017   Procedure: ABDOMINAL AORTOGRAM;  Surgeon: Conrad Watts Mills, MD;  Location: [place] CV LAB;  Service: Cardiovascular;  Laterality: N/A;   CHOLECYSTECTOMY N/A 09/23/2017   Procedure: LAPAROSCOPIC CHOLECYSTECTOMY;  Surgeon: Aviva Signs, MD;  Location: [facility];  Service: General;  Laterality: N/A;   ENDARTERECTOMY FEMORAL Left 04/28/2017   Procedure: ENDARTERECTOMY LEFT ILIO-FEMORAL ARTERY;  Surgeon: Conrad Horse Pasture, MD;  Location: [place];  Service: Vascular;  Laterality: Left;   FASCIOTOMY  09/06/2011   Procedure: FASCIOTOMY;  Surgeon: Conrad Plainfield, MD;  Location: [place];  Service: Vascular;  Laterality:  Left;   FEMORAL-TIBIAL BYPASS GRAFT  09/06/2011   Procedure: BYPASS GRAFT FEMORAL-TIBIAL ARTERY;  Surgeon: Conrad Marfa, MD;  Location: [place];  Service: Vascular;  Laterality: Left;   KNEE CARTILAGE SURGERY Left    LOWER EXTREMITY ANGIOGRAM N/A 09/05/2011   Procedure: LOWER EXTREMITY ANGIOGRAM;  Surgeon: Elam Dutch, MD;  Location: [facility] CATH LAB;  Service: Cardiovascular;  Laterality: N/A;   LOWER EXTREMITY ANGIOGRAM Left 04/15/2012   Procedure: LOWER EXTREMITY ANGIOGRAM;  Surgeon: Conrad Moonshine, MD;  Location: [facility] CATH LAB;  Service: Cardiovascular;  Laterality: Left;   LOWER EXTREMITY ANGIOGRAPHY Bilateral 04/23/2017   Procedure: Lower Extremity Angiography;  Surgeon: Conrad Bethalto, MD;  Location: [place] CV LAB;  Service: Cardiovascular;  Laterality: Bilateral;   PATCH ANGIOPLASTY Left 04/28/2017   Procedure: ILIO-FEMORAL ARTERY PATCH ANGIOPLASTY USING XENOSURE BIOLOGIC PATCH;  Surgeon: Conrad Brevard, MD;  Location: [place];  Service: Vascular;  Laterality: Left;   PR VEIN BYPASS GRAFT,AORTO-FEM-POP  09/06/11   Left    TOOTH EXTRACTION  June-July-Aug. 2015   Several    Patient Active Problem List   Diagnosis Date Noted   Chronic alcohol abuse 12/16/2018   Leukocytosis 12/16/2018   Alcohol withdrawal ([place]) 10/02/2018   COPD (chronic obstructive pulmonary disease) ([place]) 09/30/2018   Pancreatitis, acute 04/12/2018   Tobacco  abuse 04/12/2018   GERD (gastroesophageal reflux disease) 04/12/2018   Alcohol abuse 04/12/2018   Hx of seizure disorder 04/12/2018   History of atrial flutter 04/12/2018   HTN (hypertension) 04/12/2018   Gallstone pancreatitis    Erythrocytosis 09/10/2017   Calculus of gallbladder with cholecystitis without biliary obstruction    Atrial flutter ([place]) 07/29/2017   Acute gallstone pancreatitis 09/32/3557   Acute alcoholic pancreatitis 32/20/2542   Hypertensive urgency 07/22/2017   Acute pancreatitis 07/22/2017   Accelerated hypertension    Tobacco abuse counseling     PAD (peripheral artery disease) ([place]) 04/28/2017   Chronic migraine 02/11/2017   New daily persistent headache 12/08/2016   Peripheral vascular disease ([place]) 12/08/2016   Pain in joint, lower leg 02/10/2014   Numbness and tingling of left leg 12/16/2013   Cramps of left lower extremity-Leg 12/16/2013   Aftercare following surgery of the circulatory system, NEC 12/16/2013   PVD (peripheral vascular disease) with claudication ([place]) 05/07/2012   Pain in limb 03/26/2012   Pain, lower leg 12/26/2011   Atherosclerosis of native arteries of extremity with intermittent claudication ([place]) 09/26/2011   CLOSED FRACTURE OF UPPER END OF TIBIA 02/27/2010   TEAR MEDIAL MENISCUS 02/27/2010    ONSET DATE: April 2023  REFERRING DIAG: Dr. Edmonia Lynch  THERAPY DIAG:  Chronic left shoulder pain  Other symptoms and signs involving the musculoskeletal system  Rationale for Evaluation and Treatment Rehabilitation  SUBJECTIVE:   SUBJECTIVE STATEMENT: S: "I use it when I can."   PRECAUTIONS: None  WEIGHT BEARING RESTRICTIONS No  PAIN:  Are you having pain? Yes: NPRS scale: 1/10 Pain location: left shoulder Pain description: Tight  Aggravating factors: movement, use, lifting Relieving factors: rest   OBJECTIVE:   FUNCTIONAL OUTCOME MEASURES: Quick Dash: 29.55 12/30/21: 27.27  UPPER EXTREMITY ROM     Active ROM Left eval Left 12/30/2021  Shoulder flexion 150 155  Shoulder abduction 118 160  Shoulder internal rotation 90 90  Shoulder external rotation 60 90  (Blank rows = not tested)   UPPER EXTREMITY MMT:     MMT Left eval Left 12/30/2021  Shoulder flexion 5/5 5/5  Shoulder abduction 4+/5 4+/5  Shoulder internal rotation 5/5 5/5  Shoulder external rotation 4+/5 5/5  (Blank rows = not tested)   OBSERVATIONS: Hypermobility of joints   TODAY'S TREATMENT:   12/30/21 -A/ROM: seated-protraction, flexion, horizontal abduction, er/IR, abduction, 10 reps  each   12/23/21 -Strengthening: supine-protraction, flexion, horizontal abduction, er/IR, 2#; abduction 1#; 10 reps each -Proximal shoulder strengthening: supine-paddles, criss cross, circles each direction, 10 reps each using 2# -Therapy ball strengthening: coral ball-chest press, flexion, circles each direction, 10 reps each  -Overhead lacing: seated, lacing from top down then removing -Scapular theraband: green band, row, extension, retraction, 10 reps each -Ball on wall: 1' flexion   12/17/21 -Manual Therapy: Myofascial release and trigger point on L biceps, pectoralis, axillary region, and trapezius to address fascial restrictions, improve ROM and reduce pain.  -A/ROM: seated, flexion, abduction, horizontal abduction, er/IR, protraction 1x10 -Wall Washing: flexion, abduction 10x10 sec hold -stretches: bicep, pectoralis 3x20" -scapular strengthening: extension, rows, retraction, protraction, red theraband, 1x10 -strengthening: tricep extensions, red theraband, 1x10   PATIENT EDUCATION: Education details: Reviewed HEP Person educated: Patient Education method: Explanation, Demonstration, and Handouts Education comprehension: verbalized understanding and returned demonstration   HOME EXERCISE PROGRAM: Eval: shoulder A/ROM 9/19: scapular strengthening  GOALS: Goals reviewed with patient? Yes  SHORT TERM GOALS: Target date: 01/27/2022    Pt will be provided  with and educated on HEP to improve mobility required for ADL completion using dominant LUE.   Goal status: MET  2.  Pt will decrease pain to 3/10 or less to improve ability to sleep in preferred position for 2+ consecutive hours or greater.   Goal status: NOT MET  3.  Pt will increase LUE strength to 5/5 throughout LUE to improve ability to perform lifting tasks as needed during meal preparation and housework tasks.   Goal status: PARTIALLY MET   4.  Pt will decrease fascial restrictions in LUE to trace amounts to  improve mobility required for functional reaching tasks up, behind back.   Goal status: MET  5.  Pt will improve activity tolerance in the LUE required for use as dominant during all ADL and housework or community tasks.   Goal status: MET   ASSESSMENT:  CLINICAL IMPRESSION: Reassessment completed this session, pt has met 3/5 goals with an additional goal partially met. Pt demonstrates ROM WNL throughout LUE, strength is good as well with abduction slightly weaker. Pt reports continued pain in LUE, however has greater concern regarding pain in back and right leg. Pt reports he is working to incorporate LUE into daily tasks, discussed pain management techniques today.  Pt agreeable to discharge today with plan to continue HEP at home, focus in-clinic visits on PT for back and leg pain. Reviewed A/ROM, pt completing with good form, occasional reports of muscle fatigue and mild burning pain. Lengthy discussion regarding PT visits and assisted pt with scheduling upcoming visits. Of note: pt did not demonstrate any overt signs of pain once seated.   PLAN: OT FREQUENCY: 1x/week  OT DURATION: 4 weeks  PLANNED INTERVENTIONS: self care/ADL training, therapeutic exercise, therapeutic activity, manual therapy, passive range of motion, electrical stimulation, ultrasound, moist heat, patient/family education, and DME and/or AE instructions  CONSULTED AND AGREED WITH PLAN OF CARE: Patient  PLAN FOR NEXT SESSION: discharge     Guadelupe Sabin, OTR/L  [phone] 12/30/2021, 11:08 AM    OCCUPATIONAL THERAPY DISCHARGE SUMMARY  Visits from Start of Care: 4  Current functional level related to goals / functional outcomes: See above. Pt has met 3/5 goals, 1 additional partially met. Is working to use LUE during ADLs.    Remaining deficits: Pain in LUE   Education / Equipment: HEP   Patient agrees to discharge. Patient goals were partially met. Patient is being discharged due to meeting the  stated rehab goals.Marland Kitchen

## 2022-01-06 ENCOUNTER — Encounter (HOSPITAL_COMMUNITY): Payer: Medicaid Other | Admitting: Occupational Therapy

## 2022-01-07 ENCOUNTER — Ambulatory Visit (HOSPITAL_COMMUNITY): Payer: Medicaid Other | Admitting: Physical Therapy

## 2022-01-07 ENCOUNTER — Encounter (HOSPITAL_COMMUNITY): Payer: Self-pay | Admitting: Physical Therapy

## 2022-01-07 DIAGNOSIS — M25512 Pain in left shoulder: Secondary | ICD-10-CM | POA: Diagnosis not present

## 2022-01-07 DIAGNOSIS — M5459 Other low back pain: Secondary | ICD-10-CM

## 2022-01-07 DIAGNOSIS — R29898 Other symptoms and signs involving the musculoskeletal system: Secondary | ICD-10-CM

## 2022-01-07 DIAGNOSIS — Z9181 History of falling: Secondary | ICD-10-CM

## 2022-01-07 NOTE — Therapy (Signed)
OUTPATIENT PHYSICAL THERAPY TREATMENT   Patient Name: Brady FabryKenneth L Mccampbell MRN: [id number] DOB:07/07/1955, 66 y.o., male Today's Date: 01/07/2022   PT End of Session - 12/25/21 0949     Visit Number 4    Number of Visits 16    Date for PT Re-Evaluation 01/15/22    Authorization Type Medicaid [place]    Authorization Time Period 3 visits approved 9/11 -9/24    Authorization - Visit Number 3 requested 12 more visits    Authorization - Number of Visits 3    Progress Note Due on Visit 15   PT Start Time 0900    PT Stop Time 0945    PT Time Calculation (min) 45 min    Activity Tolerance Patient limited by pain    Behavior During Therapy Agitated              Past Medical History:  Diagnosis Date   Chronic ankle pain    Chronic knee pain    COPD (chronic obstructive pulmonary disease) (HCC) 09/30/2018   on CT chest   GERD (gastroesophageal reflux disease)    Hepatitis    Hep A in 1980's   History of atrial flutter    Documented April/May 2019 in the setting of pancreatitis   Hypoglycemia    Migraines    Pancreatitis    Peripheral vascular disease (HCC)    Recurrent sinus infections    Seizures (HCC)    Shingles    Past Surgical History:  Procedure Laterality Date   ABDOMINAL AORTOGRAM N/A 04/23/2017   Procedure: ABDOMINAL AORTOGRAM;  Surgeon: Fransisco Hertzhen, Brian L, MD;  [facility];  Service: Cardiovascular;  Laterality: N/A;   CHOLECYSTECTOMY N/A 09/23/2017   Procedure: LAPAROSCOPIC CHOLECYSTECTOMY;  Surgeon: Franky MachoJenkins, Mark, MD;  Location: [facility];  Service: General;  Laterality: N/A;   ENDARTERECTOMY FEMORAL Left 04/28/2017   Procedure: ENDARTERECTOMY LEFT ILIO-FEMORAL ARTERY;  Surgeon: Fransisco Hertzhen, Brian L, MD;  [facility];  Service: Vascular;  Laterality: Left;   FASCIOTOMY  09/06/2011   Procedure: FASCIOTOMY;  Surgeon: Fransisco HertzBrian L Chen, MD;  [facility];  Service: Vascular;  Laterality: Left;   FEMORAL-TIBIAL BYPASS GRAFT  09/06/2011   Procedure: BYPASS GRAFT  FEMORAL-TIBIAL ARTERY;  Surgeon: Fransisco HertzBrian L Chen, MD;  [facility];  Service: Vascular;  Laterality: Left;   KNEE CARTILAGE SURGERY Left    LOWER EXTREMITY ANGIOGRAM N/A 09/05/2011   Procedure: LOWER EXTREMITY ANGIOGRAM;  Surgeon: Sherren Kernsharles E Fields, MD;  [facility];  Service: Cardiovascular;  Laterality: N/A;   LOWER EXTREMITY ANGIOGRAM Left 04/15/2012   Procedure: LOWER EXTREMITY ANGIOGRAM;  Surgeon: Fransisco HertzBrian L Chen, MD;  [facility];  Service: Cardiovascular;  Laterality: Left;   LOWER EXTREMITY ANGIOGRAPHY Bilateral 04/23/2017   Procedure: Lower Extremity Angiography;  Surgeon: Fransisco Hertzhen, Brian L, MD;  [facility];  Service: Cardiovascular;  Laterality: Bilateral;   PATCH ANGIOPLASTY Left 04/28/2017   Procedure: ILIO-FEMORAL ARTERY PATCH ANGIOPLASTY USING XENOSURE BIOLOGIC PATCH;  Surgeon: Fransisco Hertzhen, Brian L, MD;  [facility];  Service: Vascular;  Laterality: Left;   PR VEIN BYPASS GRAFT,AORTO-FEM-POP  09/06/11   Left    TOOTH EXTRACTION  June-July-Aug. 2015   Several    Patient Active Problem List   Diagnosis Date Noted   Chronic alcohol abuse 12/16/2018   Leukocytosis 12/16/2018   Alcohol withdrawal (HCC) 10/02/2018   COPD (chronic obstructive pulmonary disease) (HCC) 09/30/2018   Pancreatitis, acute 04/12/2018   Tobacco abuse 04/12/2018  GERD (gastroesophageal reflux disease) 04/12/2018   Alcohol abuse 04/12/2018   Hx of seizure disorder 04/12/2018   History of atrial flutter 04/12/2018   HTN (hypertension) 04/12/2018   Gallstone pancreatitis    Erythrocytosis 09/10/2017   Calculus of gallbladder with cholecystitis without biliary obstruction    Atrial flutter (HCC) 07/29/2017   Acute gallstone pancreatitis 07/22/2017   Acute alcoholic pancreatitis 07/22/2017   Hypertensive urgency 07/22/2017   Acute pancreatitis 07/22/2017   Accelerated hypertension    Tobacco abuse counseling    PAD (peripheral artery disease) (HCC) 04/28/2017   Chronic migraine  02/11/2017   New daily persistent headache 12/08/2016   Peripheral vascular disease (HCC) 12/08/2016   Pain in joint, lower leg 02/10/2014   Numbness and tingling of left leg 12/16/2013   Cramps of left lower extremity-Leg 12/16/2013   Aftercare following surgery of the circulatory system, NEC 12/16/2013   PVD (peripheral vascular disease) with claudication (HCC) 05/07/2012   Pain in limb 03/26/2012   Pain, lower leg 12/26/2011   Atherosclerosis of native arteries of extremity with intermittent claudication (HCC) 09/26/2011   CLOSED FRACTURE OF UPPER END OF TIBIA 02/27/2010   TEAR MEDIAL MENISCUS 02/27/2010    PCP: Roe Rutherford, NP  REFERRING PROVIDER: Roe Rutherford, NP  REFERRING DIAG: M54.31 rt sciatic nerve pain and z91.81 hx of recent fall   Rationale for Evaluation and Treatment Rehabilitation  THERAPY DIAG:  Other symptoms and signs involving the musculoskeletal system  Other low back pain  History of falling  ONSET DATE: April 2023 after a fall  SUBJECTIVE:                                                                                                                                                                                           SUBJECTIVE STATEMENT: States he has started to walk with a SPC again due to pain. Thinks he irritated his back again while doing some upper body exercises and has not been doing HEP regularly. Getting another ortho appointment scheduled.  PERTINENT HISTORY:  Reports Lt knee 2 arthroscopy, and popliteal bypass on Lt  PAIN:  Are you having pain? Yes: NPRS scale: 8/10 Pain location: back and RLE Pain description: sharp, spasm, sore Aggravating factors: transfers, standing, walking, lying down Relieving factors: medication    PLOF: Independent  PATIENT GOALS Decrease pain   OBJECTIVE:    POSTURE: No Significant postural limitations  PALPATION: TrPs Rt glute TTP with cPA L4-5  LUMBAR ROM:   Active  A/PROM   eval  Flexion WNL P!  Extension Limited 50% P!  Right lateral flexion Limited 25% P!  Left lateral flexion  Limited 25%  Right rotation   Left rotation    (Blank rows = not tested)   LOWER EXTREMITY MMT:    MMT Right eval Left eval  Hip flexion 3+ 4  Hip extension 3 3+  Hip abduction 4- 4  Hip adduction    Hip internal rotation    Hip external rotation    Knee flexion 4 4+  Knee extension 4+ 5  Ankle dorsiflexion 5 5  Ankle plantarflexion    Ankle inversion    Ankle eversion     (Blank rows = not tested)  LUMBAR SPECIAL TESTS:  Straight leg raise test: Positive and Slump test: Positive RT  FUNCTIONAL TESTS:  5 times sit to stand: 27 seconds no hands 2 minute walk test: 304' no AD  GAIT: Distance walked: 59' Assistive device utilized: None Level of assistance: Modified independence Comments: Antalgic gait pattern, decreased stance time on Rt, good posture, no buckling noted, stops intermittently due to pain    TODAY'S TREATMENT                         01/07/22   Hooklying lumbar rotation x10   Glute stretch, knee to opposite shoulder 3x30" ea      Abdominal bracing in supine:   March 2x10   Ball Press 2x10      Sidelying in clamshell 10x10   Further education for symptom awareness and modifications for positioning to limit and avoid peripheralizing activities.   12/25/21                        Standing:                        Heel raises x 10                        Functional squat x 10                         Hip abduction x 15                        Hip extension x 15                         Hip excursion x 3                        Supine:                         Knee to chest x 3 30"                        Active hamstring stretch x 3 30"                        Prone:                         POE:  attempted but unable to tolerate                         Prone with glut sets with 2 pillows under abdomen.  Sit:                         Sit to stand x 10                         12/16/21 Standing:  hip excursions 10X each direction  Hip abduction 2X10  Hip extension 2X10 Sitting:  Sit to stand no UE 10X  LAQ 10X each Supine:  bridge 2X10  SLR with ab iso 10X5'     PATIENT EDUCATION:  Education details: Findings, symptom awareness, activity modification, modalities as needed. Person educated: Patient Education method: Explanation Education comprehension: verbalized understanding   HOME EXERCISE PROGRAM:              9/27:  knee to chest and hamstring stretch x 3 @  Access code : [email or web address]  Standing hip abduction, extension, hip excursion  Sit to stands, LAQ  Supine: bridge and SLR  Access Code: [email or web address] URL: [email or web address] Date: 12/04/2021 Prepared by: Kathlyn Sacramento  Exercises - Supine Transversus Abdominis Bracing - Hands on Stomach  - 3 x daily - 7 x weekly - 2 sets - 10 reps - Supine Piriformis Stretch with Foot on Ground  - 3 x daily - 7 x weekly - 2 sets - 10 reps - Prone Press Up On Elbows  - 3 x daily - 7 x weekly - 3 sets - 5 reps  ASSESSMENT:  CLINICAL IMPRESSION: Omitted standing exercises this date as Pt felt he did not tolerate the standing as well last visit and therefore modified activities today for improved tolerance. Still requires frequent cues for controlled movement and range to prevent increasing symptoms with aggressive uncontrolled exercise motions. Patient will continue to benefit from skilled physical therapy services to further improve independence with functional mobility.    OBJECTIVE IMPAIRMENTS Abnormal gait, decreased activity tolerance, decreased balance, decreased endurance, decreased knowledge of condition, decreased mobility, difficulty walking, decreased ROM, decreased strength, increased fascial restrictions, impaired flexibility, and pain.   ACTIVITY LIMITATIONS carrying, lifting, bending, sitting, standing, squatting, sleeping, stairs,  transfers, and bathing  PARTICIPATION LIMITATIONS: cleaning, laundry, driving, shopping, community activity, and yard work  PERSONAL FACTORS Past/current experiences and Time since onset of injury/illness/exacerbation are also affecting patient's functional outcome.   REHAB POTENTIAL: Good  CLINICAL DECISION MAKING: Stable/uncomplicated  EVALUATION COMPLEXITY: Low   GOALS: Goals reviewed with patient? Yes  SHORT TERM GOALS: Target date: 12/25/21  Patient will be independent with initial HEP and self-management strategies to improve functional outcomes Baseline: Initiated Goal status: IN PROGRESS   2. Patient will demonstrate >4 point reduction with Oswestry Low Back Disability Questionnaire to show improvement in subjective functional level.    Baseline: 31/50 = 62%    Goal Status: IN PROGRESS  LONG TERM GOALS: Target date: 01/15/22  Patient will be independent with advanced HEP and self-management strategies to improve functional outcomes Baseline: n/a Goal status: IN PROGRESS  2.  Patient will improve Oswestry Low Back Disability Questionnaire score by 12 points (MCID) or greater to indicate improvement in functional outcomes Baseline: 31/50 = 62% Goal status: IN PROGRESS  3.  Patient will report reduction of back pain to <3/10 for improved quality of life and ability to perform ADLs  Baseline: 10/10 Goal status: IN PROGRESS  4. Patient will have equal to or > 4+/5 MMT throughout BIL LEs to improve ability to perform functional mobility, stair ambulation and ADLs.  Baseline: See above  Goal status: IN PROGRESS  5. Patient will be able to ambulate at least 375 feet during with LRAD to demonstrate improved ability to perform functional mobility and associated tasks. Baseline: 306 feet no AD Goal status: IN PROGRESS     PLAN: PT FREQUENCY: 2x/week  PT DURATION: 6 weeks  PLANNED INTERVENTIONS: Therapeutic exercises, Therapeutic activity, Neuromuscular  re-education, Balance training, Gait training, Patient/Family education, Self Care, Joint mobilization, Joint manipulation, Stair training, DME instructions, Dry Needling, Spinal manipulation, Spinal mobilization, Cryotherapy, Moist heat, Traction, Manual therapy, and Re-evaluation.  PLAN FOR NEXT SESSION: Progress lumbar flexibility, centralization of symptoms (RLE radiculopathy,) core strengthening.  Attempt to see if pt has a bias for flexion or extension based exercises.   Kathlyn Sacramento, PT, DPT Physical Therapist Acute Rehabilitation Services [facility] Outpatient Rehabilitation Services [facility]   01/07/2022, 9:46 AM

## 2022-01-13 ENCOUNTER — Encounter (HOSPITAL_COMMUNITY): Payer: Medicaid Other | Admitting: Occupational Therapy

## 2022-01-15 ENCOUNTER — Ambulatory Visit (HOSPITAL_COMMUNITY): Payer: Medicaid Other | Admitting: Physical Therapy

## 2022-01-15 DIAGNOSIS — M5459 Other low back pain: Secondary | ICD-10-CM

## 2022-01-15 DIAGNOSIS — R29898 Other symptoms and signs involving the musculoskeletal system: Secondary | ICD-10-CM

## 2022-01-15 NOTE — Therapy (Signed)
[facility], [place], [address] Phone: [phone]   Fax:  [phone]  Patient Details  Name: Brady Bell MRN: [id number] Date of Birth: 1956-02-12 Referring Provider:  Pablo Lawrence, NP  Encounter Date: 01/15/2022  Patient arrived 28 min late to appointment due to trouble finding location. Non visit today, will resume at next scheduled appointment 01/17/22.   3:10 PM, 01/15/22 Josue Hector PT DPT  Physical Therapist with [facility]  [phone] [place], [place], [address] Phone: [phone]   Fax:  [phone]

## 2022-01-17 ENCOUNTER — Ambulatory Visit (HOSPITAL_COMMUNITY): Payer: Medicaid Other | Admitting: Physical Therapy

## 2022-01-17 ENCOUNTER — Encounter (HOSPITAL_COMMUNITY): Payer: Self-pay | Admitting: Physical Therapy

## 2022-01-17 DIAGNOSIS — R262 Difficulty in walking, not elsewhere classified: Secondary | ICD-10-CM

## 2022-01-17 DIAGNOSIS — M25512 Pain in left shoulder: Secondary | ICD-10-CM | POA: Diagnosis not present

## 2022-01-17 DIAGNOSIS — M5459 Other low back pain: Secondary | ICD-10-CM

## 2022-01-17 DIAGNOSIS — R29898 Other symptoms and signs involving the musculoskeletal system: Secondary | ICD-10-CM

## 2022-01-17 DIAGNOSIS — M6281 Muscle weakness (generalized): Secondary | ICD-10-CM

## 2022-01-17 DIAGNOSIS — Z9181 History of falling: Secondary | ICD-10-CM

## 2022-01-17 DIAGNOSIS — M545 Low back pain, unspecified: Secondary | ICD-10-CM

## 2022-01-17 NOTE — Therapy (Signed)
OUTPATIENT PHYSICAL THERAPY TREATMENT   Patient Name: Brady Bell MRN: [id number] DOB:[birth year], 66 y.o., male Today's Date: 01/17/2022   PHYSICAL THERAPY DISCHARGE SUMMARY  Visits from Start of Care: 5  Current functional level related to goals / functional outcomes: See below   Remaining deficits: See Below   Education / Equipment: HEP   Patient agrees to discharge. Patient goals were partially met. Patient is being discharged due to  Pt request d/c therapy, having surgery 11/6.Marland Kitchen   PT End of Session - 01/17/22 1030     Visit Number 5    Number of Visits 12    Date for PT Re-Evaluation 01/15/22    Authorization Type Medicaid [place] Time Period 12 visits approved 10/3-11/13    Authorization - Visit Number 4    Authorization - Number of Visits 12    Progress Note Due on Visit 10    PT Start Time 1028    PT Stop Time 1113    PT Time Calculation (min) 45 min    Activity Tolerance Patient tolerated treatment well    Behavior During Therapy WFL for tasks assessed/performed                Past Medical History:  Diagnosis Date   Chronic ankle pain    Chronic knee pain    COPD (chronic obstructive pulmonary disease) ([place]) 09/30/2018   on CT chest   GERD (gastroesophageal reflux disease)    Hepatitis    Hep A in 1980's   History of atrial flutter    Documented April/May 2019 in the setting of pancreatitis   Hypoglycemia    Migraines    Pancreatitis    Peripheral vascular disease ([place])    Recurrent sinus infections    Seizures ([place])    Shingles    Past Surgical History:  Procedure Laterality Date   ABDOMINAL AORTOGRAM N/A 04/23/2017   Procedure: ABDOMINAL AORTOGRAM;  Surgeon: Conrad Loretto, MD;  Location: [place] [facility];  Service: Cardiovascular;  Laterality: N/A;   CHOLECYSTECTOMY N/A 09/23/2017   Procedure: LAPAROSCOPIC CHOLECYSTECTOMY;  Surgeon: Aviva Signs, MD;  Location: [facility];  Service: General;  Laterality: N/A;    ENDARTERECTOMY FEMORAL Left 04/28/2017   Procedure: ENDARTERECTOMY LEFT ILIO-FEMORAL ARTERY;  Surgeon: Conrad [place], MD;  Location: [place];  Service: Vascular;  Laterality: Left;   FASCIOTOMY  09/06/2011   Procedure: FASCIOTOMY;  Surgeon: Conrad [place], MD;  Location: [place];  Service: Vascular;  Laterality: Left;   FEMORAL-TIBIAL BYPASS GRAFT  09/06/2011   Procedure: BYPASS GRAFT FEMORAL-TIBIAL ARTERY;  Surgeon: Conrad Wagner, MD;  Location: [place];  Service: Vascular;  Laterality: Left;   KNEE CARTILAGE SURGERY Left    LOWER EXTREMITY ANGIOGRAM N/A 09/05/2011   Procedure: LOWER EXTREMITY ANGIOGRAM;  Surgeon: Elam Dutch, MD;  Location: [facility] CATH LAB;  Service: Cardiovascular;  Laterality: N/A;   LOWER EXTREMITY ANGIOGRAM Left 04/15/2012   Procedure: LOWER EXTREMITY ANGIOGRAM;  Surgeon: Conrad [place], MD;  Location: [facility] CATH LAB;  Service: Cardiovascular;  Laterality: Left;   LOWER EXTREMITY ANGIOGRAPHY Bilateral 04/23/2017   Procedure: Lower Extremity Angiography;  Surgeon: Conrad [place], MD;  Location: [place] [facility];  Service: Cardiovascular;  Laterality: Bilateral;   PATCH ANGIOPLASTY Left 04/28/2017   Procedure: ILIO-FEMORAL ARTERY PATCH ANGIOPLASTY USING Rueben Bash BIOLOGIC PATCH;  Surgeon: Conrad Parsons, MD;  Location: [place];  Service: Vascular;  Laterality: Left;   PR VEIN BYPASS GRAFT,AORTO-FEM-POP  09/06/11  Left    TOOTH EXTRACTION  June-July-Aug. 2015   Several    Patient Active Problem List   Diagnosis Date Noted   Chronic alcohol abuse 12/16/2018   Leukocytosis 12/16/2018   Alcohol withdrawal ([place]) 10/02/2018   COPD (chronic obstructive pulmonary disease) ([place]) 09/30/2018   Pancreatitis, acute 04/12/2018   Tobacco abuse 04/12/2018   GERD (gastroesophageal reflux disease) 04/12/2018   Alcohol abuse 04/12/2018   Hx of seizure disorder 04/12/2018   History of atrial flutter 04/12/2018   HTN (hypertension) 04/12/2018   Gallstone pancreatitis    Erythrocytosis 09/10/2017   Calculus  of gallbladder with cholecystitis without biliary obstruction    Atrial flutter ([place]) 07/29/2017   Acute gallstone pancreatitis 35/32/9924   Acute alcoholic pancreatitis 26/83/4196   Hypertensive urgency 07/22/2017   Acute pancreatitis 07/22/2017   Accelerated hypertension    Tobacco abuse counseling    PAD (peripheral artery disease) ([place]) 04/28/2017   Chronic migraine 02/11/2017   New daily persistent headache 12/08/2016   Peripheral vascular disease ([place]) 12/08/2016   Pain in joint, lower leg 02/10/2014   Numbness and tingling of left leg 12/16/2013   Cramps of left lower extremity-Leg 12/16/2013   Aftercare following surgery of the circulatory system, NEC 12/16/2013   PVD (peripheral vascular disease) with claudication ([place]) 05/07/2012   Pain in limb 03/26/2012   Pain, lower leg 12/26/2011   Atherosclerosis of native arteries of extremity with intermittent claudication ([place]) 09/26/2011   CLOSED FRACTURE OF UPPER END OF TIBIA 02/27/2010   TEAR MEDIAL MENISCUS 02/27/2010    PCP: Pablo Lawrence, NP  REFERRING PROVIDER: Pablo Lawrence, NP  REFERRING DIAG: M54.31 rt sciatic nerve pain and z91.81 hx of recent fall   Rationale for Evaluation and Treatment Rehabilitation  THERAPY DIAG:  Other symptoms and signs involving the musculoskeletal system  Other low back pain  History of falling  Muscle weakness (generalized)  Low back pain, unspecified back pain laterality, unspecified chronicity, unspecified whether sciatica present  Difficulty in walking, not elsewhere classified  ONSET DATE: April 2023 after a fall  SUBJECTIVE:                                                                                                                                                                                           SUBJECTIVE STATEMENT: Reports met with surgeon. Planning to have back surgery 11/6, unsure of what kind of procedure he will be having. Would like to  d/c.  PERTINENT HISTORY:  Reports Lt knee 2 arthroscopy, and popliteal bypass on Lt  PAIN:  Are you having pain? Yes: NPRS scale: 8-9/10 Pain location: back and RLE Pain description:  sharp, spasm, sore Aggravating factors: transfers, standing, walking, lying down Relieving factors: medication    PLOF: Independent  PATIENT GOALS Decrease pain   OBJECTIVE:    POSTURE: No Significant postural limitations  PALPATION: TrPs Rt glute TTP with cPA L4-5  LUMBAR ROM:   Active  A/PROM  eval  Flexion WNL P!  Extension Limited 50% P!  Right lateral flexion Limited 25% P!  Left lateral flexion Limited 25%  Right rotation   Left rotation    (Blank rows = not tested)   LOWER EXTREMITY MMT:    MMT Right eval Left eval Right  10/20 Left 10/20  Hip flexion 3+ 4 4- 4  Hip extension 3 3+ 3 3+  Hip abduction 4- 4 4 4+  Hip adduction      Hip internal rotation      Hip external rotation      Knee flexion 4 4+ 4 4+  Knee extension 4+ _0 Ankle dorsiflexion _1 Ankle plantarflexion      Ankle inversion      Ankle eversion       (Blank rows = not tested)  LUMBAR SPECIAL TESTS:  Straight leg raise test: Positive and Slump test: Positive RT  FUNCTIONAL TESTS:  5 times sit to stand: 27 seconds no hands 2 minute walk test: 304' no AD  GAIT: Distance walked: 110' Assistive device utilized: None Level of assistance: Modified independence Comments: Antalgic gait pattern, decreased stance time on Rt, good posture, no buckling noted, stops intermittently due to pain    TODAY'S TREATMENT  01/17/22 Reassessment MMT Oswestry FOM Education - d/c planning  HEP provided for prior to surgery   01/07/22   Hooklying lumbar rotation x10   Glute stretch, knee to opposite shoulder 3x30" ea      Abdominal bracing in supine:   March 2x10   Ball Press 2x10      Sidelying in clamshell 10x10   Further education for symptom awareness and modifications for positioning  to limit and avoid peripheralizing activities.   12/25/21                        Standing:                        Heel raises x 10                        Functional squat x 10                         Hip abduction x 15                        Hip extension x 15                         Hip excursion x 3                        Supine:                         Knee to chest x 3 30"                        Active hamstring stretch x  3 30"                        Prone:                         POE:  attempted but unable to tolerate                         Prone with glut sets with 2 pillows under abdomen.                         Sit:                        Sit to stand x 10                         12/16/21 Standing:  hip excursions 10X each direction  Hip abduction 2X10  Hip extension 2X10 Sitting:  Sit to stand no UE 10X  LAQ 10X each Supine:  bridge 2X10  SLR with ab iso 10X5'     PATIENT EDUCATION:  Education details: Findings, symptom awareness, activity modification, modalities as needed. Person educated: Patient Education method: Explanation Education comprehension: verbalized understanding   HOME EXERCISE PROGRAM:  01/17/22 Access Code: [email or web address] URL: [email or web address] Date: 01/17/2022 Prepared by: Candie Mile  Exercises - Supine Transversus Abdominis Bracing - Hands on Ground  - 3 x daily - 7 x weekly - 3 sets - 10 reps - Supine Transversus Abdominis Bracing with Leg Extension  - 3 x daily - 7 x weekly - 1 sets - 10 reps - Clamshell  - 3 x daily - 7 x weekly - 1 sets - 10 reps - Supine Piriformis Stretch with Foot on Ground  - 3 x daily - 7 x weekly - 3 sets - 15 sec+ hold - Supine Figure 4 Piriformis Stretch  - 3 x daily - 7 x weekly - 3 sets - 15 sec+ hold                9/27:  knee to chest and hamstring stretch x 3 @  Access code : [email or web address]  Standing hip abduction, extension, hip excursion  Sit to stands, LAQ  Supine: bridge and  SLR  Access Code: [email or web address] URL: [email or web address] Date: 12/04/2021 Prepared by: Candie Mile  Exercises - Supine Transversus Abdominis Bracing - Hands on Stomach  - 3 x daily - 7 x weekly - 2 sets - 10 reps - Supine Piriformis Stretch with Foot on Ground  - 3 x daily - 7 x weekly - 2 sets - 10 reps - Prone Press Up On Elbows  - 3 x daily - 7 x weekly - 3 sets - 5 reps  ASSESSMENT:  CLINICAL IMPRESSION: Met a few goals however subjective FOM, proximal muscle strength and pain goals not met. Pt would like to d/c due to having surgery 11/6. Would like to follow-up after surgery if surgeon approves. Will dc today    OBJECTIVE IMPAIRMENTS Abnormal gait, decreased activity tolerance, decreased balance, decreased endurance, decreased knowledge of condition, decreased mobility, difficulty walking, decreased ROM, decreased strength, increased fascial restrictions, impaired flexibility, and pain.   ACTIVITY LIMITATIONS carrying, lifting, bending, sitting, standing, squatting, sleeping, stairs, transfers, and bathing  PARTICIPATION LIMITATIONS: cleaning, laundry, driving, shopping, community activity, and yard work  PERSONAL FACTORS Past/current experiences  and Time since onset of injury/illness/exacerbation are also affecting patient's functional outcome.   REHAB POTENTIAL: Good  CLINICAL DECISION MAKING: Stable/uncomplicated  EVALUATION COMPLEXITY: Low   GOALS: Goals reviewed with patient? Yes  SHORT TERM GOALS: Target date: 12/25/21  Patient will be independent with initial HEP and self-management strategies to improve functional outcomes Baseline: Initiated Goal status: Goal Met  2. Patient will demonstrate >4 point reduction with Oswestry Low Back Disability Questionnaire to show improvement in subjective functional level.    Baseline: 31/50 = 62%; Current 31 / 50 = 62.0 %    Goal Status: Not MET  LONG TERM GOALS: Target date: 01/15/22  Patient will be  independent with advanced HEP and self-management strategies to improve functional outcomes Baseline: Has advanced HEP Goal status: Goal met  2.  Patient will improve Oswestry Low Back Disability Questionnaire score by 12 points (MCID) or greater to indicate improvement in functional outcomes Baseline: 31/50 = 62%; Current 31 / 50 = 62.0 % Goal status: Not MET  3.  Patient will report reduction of back pain to <3/10 for improved quality of life and ability to perform ADLs  Baseline: 8-9/10 Goal status: Not MET  4. Patient will have equal to or > 4+/5 MMT throughout BIL LEs to improve ability to perform functional mobility, stair ambulation and ADLs.  Baseline: See above Goal status: Not MET  5. Patient will be able to ambulate at least 375 feet during 2MWT with LRAD to demonstrate improved ability to perform functional mobility and associated tasks. Baseline: 306 feet no AD Goal status: Not tested due to pain     PLAN: PT FREQUENCY: 2x/week  PT DURATION: 6 weeks  PLANNED INTERVENTIONS: Therapeutic exercises, Therapeutic activity, Neuromuscular re-education, Balance training, Gait training, Patient/Family education, Self Care, Joint mobilization, Joint manipulation, Stair training, DME instructions, Dry Needling, Spinal manipulation, Spinal mobilization, Cryotherapy, Moist heat, Traction, Manual therapy, and Re-evaluation.  PLAN FOR NEXT SESSION: D/c to HEP  Candie Mile, PT, DPT Physical Therapist [facility] [place]   01/17/2022, 9:46 AM

## 2022-01-20 ENCOUNTER — Encounter (HOSPITAL_COMMUNITY): Payer: Medicaid Other | Admitting: Physical Therapy

## 2022-01-22 ENCOUNTER — Encounter (HOSPITAL_COMMUNITY): Payer: Medicaid Other | Admitting: Physical Therapy

## 2022-01-27 ENCOUNTER — Encounter (HOSPITAL_COMMUNITY): Payer: Medicaid Other | Admitting: Physical Therapy

## 2022-01-29 ENCOUNTER — Encounter (HOSPITAL_COMMUNITY): Payer: Medicaid Other | Admitting: Physical Therapy

## 2022-03-19 ENCOUNTER — Telehealth: Payer: Self-pay | Admitting: Cardiology

## 2022-03-19 NOTE — Telephone Encounter (Signed)
   Pre-operative Risk Assessment    Patient Name: Brady Bell  DOB: February 19, 1956 MRN: [id number]      Request for Surgical Clearance    Procedure:   Lumbar Fusion  Date of Surgery:  Clearance TBD                                 Surgeon:  Dr. Hoyt Koch Surgeon's Group or Practice Name:  [facility] Phone number:  [phone] Fax number:  [phone] attn Nikki   Type of Clearance Requested:   - Medical  - Pharmacy:  Hold If applicable      Type of Anesthesia:  General    Additional requests/questions:    Signed, Seymour Bars   03/19/2022, 4:12 PM

## 2022-03-19 NOTE — Telephone Encounter (Signed)
Clearance form does not specifically mention it but patient is on Eliquis for atrial flutter. Pharmacy, can you please comment on how long Eliquis can be held for upcoming procedure?  Thank you!

## 2022-03-20 NOTE — Telephone Encounter (Signed)
Would clarify with pt if he is taking Eliquis - afib clinic note from 7/3 states pt only needed to take it for 1 month after DCCV then stop. Dr Verna Czech note 8/15 states pt had stopped Eliquis due to bruising and didn't want to restart. Will need med list updated to remove Eliquis if he's no longer on it.

## 2022-03-20 NOTE — Telephone Encounter (Signed)
Preoperative team, please contact patient and verify whether she is taking Eliquis.  Once we clarify how she is taking the medication we will be able to provide cardiac recommendations.  Thank you for your help.  Thomasene Ripple. Maximilliano Kersh NP-C     03/20/2022, 11:43 AM [facility] [address] Office [phone] Fax [phone]

## 2022-03-20 NOTE — Telephone Encounter (Signed)
1st attempt to reach pt regarding message below.  No voicemail has been set up.

## 2022-03-21 NOTE — Telephone Encounter (Signed)
Vm has not been set up . Cannot leave a message to call back to schedule a tele pre op appt. I will update the requesting office.

## 2022-03-28 NOTE — Telephone Encounter (Signed)
I was able to reach pt on [phone] to schedule a tele pre op appt. Pt immediately began yelling at me and told me that he has said many times over that he is not be seen in our office anymore and slammed the phone down in my ear. I will update the surgeon office and let them know that we will not be providing clearance as the pt states he is no longer going to be coming to our practice.

## 2022-05-01 ENCOUNTER — Ambulatory Visit: Payer: Medicaid Other | Admitting: Cardiology

## 2022-05-16 ENCOUNTER — Other Ambulatory Visit: Payer: Self-pay | Admitting: *Deleted

## 2022-05-16 DIAGNOSIS — I739 Peripheral vascular disease, unspecified: Secondary | ICD-10-CM

## 2022-05-16 DIAGNOSIS — Z95828 Presence of other vascular implants and grafts: Secondary | ICD-10-CM

## 2022-06-03 ENCOUNTER — Ambulatory Visit (HOSPITAL_COMMUNITY)
Admission: RE | Admit: 2022-06-03 | Discharge: 2022-06-03 | Disposition: A | Discharge status: Home / Self Care | Payer: Medicaid Other | Source: Ambulatory Visit | Attending: Vascular Surgery | Admitting: Vascular Surgery

## 2022-06-03 ENCOUNTER — Ambulatory Visit: Payer: Medicaid Other | Admitting: Vascular Surgery

## 2022-06-03 DIAGNOSIS — Z95828 Presence of other vascular implants and grafts: Secondary | ICD-10-CM

## 2022-06-03 DIAGNOSIS — I739 Peripheral vascular disease, unspecified: Secondary | ICD-10-CM

## 2022-07-01 ENCOUNTER — Ambulatory Visit (INDEPENDENT_AMBULATORY_CARE_PROVIDER_SITE_OTHER)
Admission: RE | Admit: 2022-07-01 | Discharge: 2022-07-01 | Disposition: A | Discharge status: Home / Self Care | Payer: Medicaid Other | Source: Ambulatory Visit | Attending: Vascular Surgery | Admitting: Vascular Surgery

## 2022-07-01 ENCOUNTER — Ambulatory Visit: Payer: Medicaid Other | Admitting: Vascular Surgery

## 2022-07-01 ENCOUNTER — Encounter: Payer: Self-pay | Admitting: Vascular Surgery

## 2022-07-01 ENCOUNTER — Ambulatory Visit (HOSPITAL_COMMUNITY)
Admission: RE | Admit: 2022-07-01 | Discharge: 2022-07-01 | Disposition: A | Discharge status: Home / Self Care | Payer: Medicaid Other | Source: Ambulatory Visit | Attending: Vascular Surgery | Admitting: Vascular Surgery

## 2022-07-01 VITALS — BP 125/80 | HR 55 | Temp 97.2°F | Resp 16 | Ht 70.0 in | Wt 111.0 lb

## 2022-07-01 DIAGNOSIS — I739 Peripheral vascular disease, unspecified: Secondary | ICD-10-CM | POA: Insufficient documentation

## 2022-07-01 DIAGNOSIS — Z95828 Presence of other vascular implants and grafts: Secondary | ICD-10-CM | POA: Diagnosis not present

## 2022-07-01 LAB — VAS US ABI WITH/WO TBI
Left ABI: 0.99
Right ABI: 1.27

## 2022-07-01 NOTE — Progress Notes (Signed)
Patient name: Brady Bell MRN: [id number] DOB: 04-07-1955 Sex: male  REASON FOR VISIT: 1 year follow-up for bypass graft surveillance   HPI: Brady Bell is a 67 y.o. male that presents for 1 year follow-up for surveillance of his left lower extremity bypass.  He initially underwent a left common femoral to AT bypass with non-reversed ipsilateral great saphenous vein in 2013 by Dr. Bridgett Larsson for critical limb ischemia.  Most recently underwent left iliofemoral endarterectomy in January 2019 also by Dr. Bridgett Larsson.  We are watching inflow stenosis.  On follow-up today reports no new issues with his left leg.  He is having some back problems with radicular symptoms in the right leg and has received an injection   Past Medical History:  Diagnosis Date   Chronic ankle pain    Chronic knee pain    COPD (chronic obstructive pulmonary disease) 09/30/2018   on CT chest   GERD (gastroesophageal reflux disease)    Hepatitis    Hep A in 1980's   History of atrial flutter    Documented April/May 2019 in the setting of pancreatitis   Hypoglycemia    Migraines    Pancreatitis    Peripheral vascular disease    Recurrent sinus infections    Seizures    Shingles     Past Surgical History:  Procedure Laterality Date   ABDOMINAL AORTOGRAM N/A 04/23/2017   Procedure: ABDOMINAL AORTOGRAM;  Surgeon: Conrad [place], MD;  Location: [place] [facility];  Service: Cardiovascular;  Laterality: N/A;   CHOLECYSTECTOMY N/A 09/23/2017   Procedure: LAPAROSCOPIC CHOLECYSTECTOMY;  Surgeon: Aviva Signs, MD;  [facility];  Service: General;  Laterality: N/A;   ENDARTERECTOMY FEMORAL Left 04/28/2017   Procedure: ENDARTERECTOMY LEFT ILIO-FEMORAL ARTERY;  Surgeon: Conrad [place], MD;  Location: [place];  Service: Vascular;  Laterality: Left;   FASCIOTOMY  09/06/2011   Procedure: FASCIOTOMY;  Surgeon: Conrad [place], MD;  Location: [place];  Service: Vascular;  Laterality: Left;   FEMORAL-TIBIAL BYPASS GRAFT  09/06/2011    Procedure: BYPASS GRAFT FEMORAL-TIBIAL ARTERY;  Surgeon: Conrad [place], MD;  Location: [place];  Service: Vascular;  Laterality: Left;   KNEE CARTILAGE SURGERY Left    LOWER EXTREMITY ANGIOGRAM N/A 09/05/2011   Procedure: LOWER EXTREMITY ANGIOGRAM;  Surgeon: Elam Dutch, MD;  Location: [facility] CATH LAB;  Service: Cardiovascular;  Laterality: N/A;   LOWER EXTREMITY ANGIOGRAM Left 04/15/2012   Procedure: LOWER EXTREMITY ANGIOGRAM;  Surgeon: Conrad [place], MD;  Location: [facility] CATH LAB;  Service: Cardiovascular;  Laterality: Left;   LOWER EXTREMITY ANGIOGRAPHY Bilateral 04/23/2017   Procedure: Lower Extremity Angiography;  Surgeon: Conrad [place], MD;  Location: [place] CV LAB;  Service: Cardiovascular;  Laterality: Bilateral;   PATCH ANGIOPLASTY Left 04/28/2017   Procedure: ILIO-FEMORAL ARTERY PATCH ANGIOPLASTY USING XENOSURE BIOLOGIC PATCH;  Surgeon: Conrad Colver, MD;  [facility];  Service: Vascular;  Laterality: Left;   PR VEIN BYPASS GRAFT,AORTO-FEM-POP  09/06/11   Left    TOOTH EXTRACTION  June-July-Aug. 2015   Several     Family History  Problem Relation Age of Onset   Alcohol abuse Father    Kidney failure Father    Other Mother        "age"   Diabetes Other     SOCIAL HISTORY: Social History   Tobacco Use   Smoking status: Former    Packs/day: 1.00    Years: 45.00    Additional pack years: 0.00  Total pack years: 45.00    Types: Cigarettes    Quit date: 04/2019    Years since quitting: 3.2   Smokeless tobacco: Never   Tobacco comments:    Former smoke 09/30/21  Substance Use Topics   Alcohol use: Not Currently    Alcohol/week: 3.0 standard drinks of alcohol    Types: 3 Standard drinks or equivalent per week    Comment: stop drinking    No Known Allergies  Current Outpatient Medications  Medication Sig Dispense Refill   amphetamine-dextroamphetamine (ADDERALL) 20 MG tablet Take 20 mg by mouth 2 (two) times daily.     cycloSPORINE (RESTASIS) 0.05 % ophthalmic  emulsion Place 1 drop into both eyes 2 (two) times daily.     divalproex (DEPAKOTE ER) 500 MG 24 hr tablet Take 500 mg by mouth daily.     gabapentin (NEURONTIN) 300 MG capsule Take 300 mg by mouth daily.      Multiple Vitamin (MULTIVITAMIN WITH MINERALS) TABS tablet Take 1 tablet by mouth daily.     oxyCODONE (OXY IR/ROXICODONE) 5 MG immediate release tablet Take 5 mg by mouth 2 (two) times daily.     pantoprazole (PROTONIX) 40 MG tablet Take 1 tablet (40 mg total) by mouth daily before breakfast. 30 tablet 12   rosuvastatin (CRESTOR) 10 MG tablet Take 10 mg by mouth daily.     sildenafil (VIAGRA) 50 MG tablet Take 50 mg by mouth as needed.     traZODone (DESYREL) 50 MG tablet Take 50 mg by mouth at bedtime.     apixaban (ELIQUIS) 5 MG TABS tablet Take 1 tablet (5 mg total) by mouth 2 (two) times daily for 7 days. 14 tablet 0   eletriptan (RELPAX) 40 MG tablet Take 40 mg by mouth every 2 (two) hours as needed for migraine or headache. May repeat in 2 hours if headache persists or recurs. (Patient not taking: Reported on 07/01/2022)     SUMAtriptan (IMITREX) 100 MG tablet Take 1 tablet (100 mg total) by mouth every 2 (two) hours as needed for migraine. May repeat in 2 hours if headache persists or recurs. (Patient not taking: Reported on 07/01/2022) 12 tablet 11   No current facility-administered medications for this visit.    REVIEW OF SYSTEMS:  [X]  denotes positive finding, [ ]  denotes negative finding Cardiac  Comments:  Chest pain or chest pressure:    Shortness of breath upon exertion:    Short of breath when lying flat:    Irregular heart rhythm:        Vascular    Pain in calf, thigh, or hip brought on by ambulation:    Pain in feet at night that wakes you up from your sleep:     Blood clot in your veins:    Leg swelling:         Pulmonary    Oxygen at home:    Productive cough:     Wheezing:         Neurologic    Sudden weakness in arms or legs:     Sudden numbness in arms or  legs:     Sudden onset of difficulty speaking or slurred speech:    Temporary loss of vision in one eye:     Problems with dizziness:         Gastrointestinal    Blood in stool:     Vomited blood:         Genitourinary    Burning when  urinating:     Blood in urine:        Psychiatric    Major depression:         Hematologic    Bleeding problems:    Problems with blood clotting too easily:        Skin    Rashes or ulcers:        Constitutional    Fever or chills:      PHYSICAL EXAM: Vitals:   07/01/22 1146  BP: 125/80  Pulse: (!) 55  Resp: 16  Temp: (!) 97.2 F (36.2 C)  TempSrc: Temporal  SpO2: 98%  Weight: 111 lb (50.3 kg)  Height: 5\' 10"  (1.778 m)    GENERAL: The patient is a well-nourished male, in no acute distress. The vital signs are documented above. CARDIAC: There is a regular rate and rhythm.  VASCULAR:  Palpable bilateral femoral pulses Palpable bilateral DP pulses PULMONARY: No respiratory issues. ABDOMEN: Soft and non-tender. MUSCULOSKELETAL: There are no major deformities or cyanosis. NEUROLOGIC: No focal weakness or paresthesias are detected.   DATA:   ABIs  are 1.27 on the right and 0.99 on the left   Left lower extremity arterial duplex shows patent bypass with area of inflow stenosis with peak systolic velocity [id number] (previously 367) suggesting 50-74% moderate stenosis    Assessment/Plan:  70-year male presents for ongoing surveillance and 1 year follow-up for left femoral to anterior tibial artery bypass by Dr. Bridgett Larsson.  Ultimately his bypass remains patent on duplex today.  He continues have a palpable pulse in the left femoral artery and a palpable dorsalis pedis pulse.  There is an area of inflow stenosis with a velocity of 341 (previously 367) suggesting a 50 to 74% stenosis that we will continue to monitor.  Given palpable femoral and pedal pulse with preserved ABI and no symptoms we will continue to monitor.  I will see him in 6 months  with non-invasive imaging.  Marty Heck, MD [facility] Office: [place]

## 2022-07-08 ENCOUNTER — Other Ambulatory Visit: Payer: Self-pay

## 2022-07-08 DIAGNOSIS — Z95828 Presence of other vascular implants and grafts: Secondary | ICD-10-CM

## 2022-07-08 DIAGNOSIS — I739 Peripheral vascular disease, unspecified: Secondary | ICD-10-CM

## 2022-08-14 ENCOUNTER — Ambulatory Visit
Admission: RE | Admit: 2022-08-14 | Discharge: 2022-08-14 | Disposition: A | Discharge status: Home / Self Care | Payer: Medicaid Other | Source: Ambulatory Visit | Attending: Nurse Practitioner | Admitting: Nurse Practitioner

## 2022-08-14 ENCOUNTER — Ambulatory Visit (INDEPENDENT_AMBULATORY_CARE_PROVIDER_SITE_OTHER): Payer: Medicaid Other

## 2022-08-14 VITALS — BP 106/62 | HR 58 | Temp 97.7°F | Resp 18

## 2022-08-14 DIAGNOSIS — R2242 Localized swelling, mass and lump, left lower limb: Secondary | ICD-10-CM | POA: Diagnosis not present

## 2022-08-14 DIAGNOSIS — S9782XA Crushing injury of left foot, initial encounter: Secondary | ICD-10-CM

## 2022-08-14 DIAGNOSIS — M79672 Pain in left foot: Secondary | ICD-10-CM | POA: Diagnosis not present

## 2022-08-14 DIAGNOSIS — S9032XA Contusion of left foot, initial encounter: Secondary | ICD-10-CM | POA: Diagnosis not present

## 2022-08-14 NOTE — ED Triage Notes (Signed)
Ironing board fell on left foot on Thursday.  Foot swollen and bruised.

## 2022-08-14 NOTE — Discharge Instructions (Addendum)
The x-ray is negative fracture or dislocation. Your symptoms appear to be consistent with a contusion or bruising of the left foot.  May take over-the-counter Tylenol as needed for pain, fever, general discomfort. Continue to use your cane as needed while foot pain persist. RICE therapy, rest, ice, compression, and elevation.  I am providing you with an Ace wrap to provide compression and support, and a postop shoe for additional support with ambulating.  Wear the postop shoe and Ace wrap when engaged in prolonged or strenuous activity. Apply ice to the left foot.  Apply for 20 minutes, remove for 1 hour, then repeat as needed. If symptoms are not improving over the next week, I would like for you to follow-up with orthopedics for further evaluation.  He can follow-up with [facility] [place] at [phone] with [facility] at [phone]. Follow-up as needed.

## 2022-08-14 NOTE — ED Provider Notes (Signed)
[facility]    CSN: [id number] Arrival date & time: 08/14/22  1029      History   Chief Complaint Chief Complaint  Patient presents with   Ankle Pain    Needs to be x rayed Dr. Request - Entered by patient    HPI Brady Bell is a 67 y.o. male.   The history is provided by the patient.   Patient reports for complaints of left foot pain, swelling, and bruising has been present over the last several days.  Patient states that an ironing board fell on his left foot approximately 1 week ago.  Patient reports bruising has improved since his injury occurred.  He states he has been walking on the left foot.  A cane, states that he has stopped using it as much as he normally does, but has began using it more frequently since his injury occurred.  Patient reports history of bypass surgery, stating that they had to remove veins from the left leg.  He states that he had to learn how to walk again at that time.    Past Medical History:  Diagnosis Date   Chronic ankle pain    Chronic knee pain    COPD (chronic obstructive pulmonary disease) (HCC) 09/30/2018   on CT chest   GERD (gastroesophageal reflux disease)    Hepatitis    Hep A in 1980's   History of atrial flutter    Documented April/May 2019 in the setting of pancreatitis   Hypoglycemia    Migraines    Pancreatitis    Peripheral vascular disease (HCC)    Recurrent sinus infections    Seizures (HCC)    Shingles     Patient Active Problem List   Diagnosis Date Noted   Chronic alcohol abuse 12/16/2018   Leukocytosis 12/16/2018   Alcohol withdrawal (HCC) 10/02/2018   COPD (chronic obstructive pulmonary disease) (HCC) 09/30/2018   Pancreatitis, acute 04/12/2018   Tobacco abuse 04/12/2018   GERD (gastroesophageal reflux disease) 04/12/2018   Alcohol abuse 04/12/2018   Hx of seizure disorder 04/12/2018   History of atrial flutter 04/12/2018   HTN (hypertension) 04/12/2018   Gallstone pancreatitis     Erythrocytosis 09/10/2017   Calculus of gallbladder with cholecystitis without biliary obstruction    Atrial flutter (HCC) 07/29/2017   Acute gallstone pancreatitis 07/22/2017   Acute alcoholic pancreatitis 07/22/2017   Hypertensive urgency 07/22/2017   Acute pancreatitis 07/22/2017   Accelerated hypertension    Tobacco abuse counseling    PAD (peripheral artery disease) (HCC) 04/28/2017   Chronic migraine 02/11/2017   New daily persistent headache 12/08/2016   Peripheral vascular disease (HCC) 12/08/2016   Pain in joint, lower leg 02/10/2014   Numbness and tingling of left leg 12/16/2013   Cramps of left lower extremity-Leg 12/16/2013   Aftercare following surgery of the circulatory system, NEC 12/16/2013   PVD (peripheral vascular disease) with claudication (HCC) 05/07/2012   Pain in limb 03/26/2012   Pain, lower leg 12/26/2011   Atherosclerosis of native arteries of extremity with intermittent claudication (HCC) 09/26/2011   CLOSED FRACTURE OF UPPER END OF TIBIA 02/27/2010   TEAR MEDIAL MENISCUS 02/27/2010    Past Surgical History:  Procedure Laterality Date   ABDOMINAL AORTOGRAM N/A 04/23/2017   Procedure: ABDOMINAL AORTOGRAM;  Surgeon: Fransisco Hertz, MD;  Location: [facility] INVASIVE CV LAB;  Service: Cardiovascular;  Laterality: N/A;   CHOLECYSTECTOMY N/A 09/23/2017   Procedure: LAPAROSCOPIC CHOLECYSTECTOMY;  Surgeon: Franky Macho, MD;  Location: [facility]  ORS;  Service: General;  Laterality: N/A;   ENDARTERECTOMY FEMORAL Left 04/28/2017   Procedure: ENDARTERECTOMY LEFT ILIO-FEMORAL ARTERY;  Surgeon: Fransisco Hertz, MD;  Location: [facility] OR;  Service: Vascular;  Laterality: Left;   FASCIOTOMY  09/06/2011   Procedure: FASCIOTOMY;  Surgeon: Fransisco Hertz, MD;  Location: [facility] OR;  Service: Vascular;  Laterality: Left;   FEMORAL-TIBIAL BYPASS GRAFT  09/06/2011   Procedure: BYPASS GRAFT FEMORAL-TIBIAL ARTERY;  Surgeon: Fransisco Hertz, MD;  Location: [facility] OR;  Service: Vascular;  Laterality: Left;   KNEE  CARTILAGE SURGERY Left    LOWER EXTREMITY ANGIOGRAM N/A 09/05/2011   Procedure: LOWER EXTREMITY ANGIOGRAM;  Surgeon: Sherren Kerns, MD;  Location: [facility] CATH LAB;  Service: Cardiovascular;  Laterality: N/A;   LOWER EXTREMITY ANGIOGRAM Left 04/15/2012   Procedure: LOWER EXTREMITY ANGIOGRAM;  Surgeon: Fransisco Hertz, MD;  Location: [facility] CATH LAB;  Service: Cardiovascular;  Laterality: Left;   LOWER EXTREMITY ANGIOGRAPHY Bilateral 04/23/2017   Procedure: Lower Extremity Angiography;  Surgeon: Fransisco Hertz, MD;  Location: [facility] INVASIVE CV LAB;  Service: Cardiovascular;  Laterality: Bilateral;   PATCH ANGIOPLASTY Left 04/28/2017   Procedure: ILIO-FEMORAL ARTERY PATCH ANGIOPLASTY USING Livia Snellen BIOLOGIC PATCH;  Surgeon: Fransisco Hertz, MD;  Location: [facility] OR;  Service: Vascular;  Laterality: Left;   PR VEIN BYPASS GRAFT,AORTO-FEM-POP  09/06/11   Left    TOOTH EXTRACTION  June-July-Aug. 2015   Several        Home Medications    Prior to Admission medications   Medication Sig Start Date End Date Taking? Authorizing Provider  amphetamine-dextroamphetamine (ADDERALL) 20 MG tablet Take 20 mg by mouth 2 (two) times daily. 08/27/21   [provider]  apixaban (ELIQUIS) 5 MG TABS tablet Take 1 tablet (5 mg total) by mouth 2 (two) times daily for 7 days. 09/30/21 10/07/21  Fenton, Clint R, PA  cycloSPORINE (RESTASIS) 0.05 % ophthalmic emulsion Place 1 drop into both eyes 2 (two) times daily.    [provider]  divalproex (DEPAKOTE ER) 500 MG 24 hr tablet Take 500 mg by mouth daily. 08/05/21   [provider]  eletriptan (RELPAX) 40 MG tablet Take 40 mg by mouth every 2 (two) hours as needed for migraine or headache. May repeat in 2 hours if headache persists or recurs. Patient not taking: Reported on 07/01/2022    [provider]  gabapentin (NEURONTIN) 300 MG capsule Take 300 mg by mouth daily.     [provider]  Multiple Vitamin (MULTIVITAMIN WITH MINERALS) TABS tablet Take 1  tablet by mouth daily.    [provider]  oxyCODONE (OXY IR/ROXICODONE) 5 MG immediate release tablet Take 5 mg by mouth 2 (two) times daily. 07/27/20   [provider]  pantoprazole (PROTONIX) 40 MG tablet Take 1 tablet (40 mg total) by mouth daily before breakfast. 08/03/17   Kari Baars, MD  rosuvastatin (CRESTOR) 10 MG tablet Take 10 mg by mouth daily. 08/13/20   [provider]  sildenafil (VIAGRA) 50 MG tablet Take 50 mg by mouth as needed. 06/26/21   [provider]  SUMAtriptan (IMITREX) 100 MG tablet Take 1 tablet (100 mg total) by mouth every 2 (two) hours as needed for migraine. May repeat in 2 hours if headache persists or recurs. Patient not taking: Reported on 07/01/2022 12/08/16   Levert Feinstein, MD  traZODone (DESYREL) 50 MG tablet Take 50 mg by mouth at bedtime.    [provider]    [facility]  History Family History  Problem Relation Age of Onset   Alcohol abuse Father    Kidney failure Father    Other Mother        "age"   Diabetes Other     Social History Social History   Tobacco Use   Smoking status: Former    Packs/day: 1.00    Years: 45.00    Additional pack years: 0.00    Total pack years: 45.00    Types: Cigarettes    Quit date: 04/2019    Years since quitting: 3.3   Smokeless tobacco: Never   Tobacco comments:    Former smoke 09/30/21  Vaping Use   Vaping Use: Never used  Substance Use Topics   Alcohol use: Not Currently    Alcohol/week: 3.0 standard drinks of alcohol    Types: 3 Standard drinks or equivalent per week    Comment: stop drinking   Drug use: Yes    Types: Marijuana    Comment: 2-3 times a week 09/30/21     Allergies   Patient has no known allergies.   Review of Systems Review of Systems Per HPI  Physical Exam Triage Vital Signs ED Triage Vitals  Enc Vitals Group     BP 08/14/22 1058 106/62     Pulse Rate 08/14/22 1058 (!) 58     Resp 08/14/22 1058 18     Temp 08/14/22 1058 97.7 F  (36.5 C)     Temp Source 08/14/22 1058 Oral     SpO2 08/14/22 1058 94 %     Weight --      Height --      Head Circumference --      Peak Flow --      Pain Score 08/14/22 1100 8     Pain Loc --      Pain Edu? --      Excl. in GC? --    No data found.  Updated Vital Signs BP 106/62 (BP Location: Right Arm)   Pulse (!) 58   Temp 97.7 F (36.5 C) (Oral)   Resp 18   SpO2 94%   Visual Acuity Right Eye Distance:   Left Eye Distance:   Bilateral Distance:    Right Eye Near:   Left Eye Near:    Bilateral Near:     Physical Exam Vitals and nursing note reviewed.  Constitutional:      General: He is not in acute distress.    Appearance: Normal appearance.  HENT:     Head: Normocephalic.  Eyes:     Extraocular Movements: Extraocular movements intact.     Pupils: Pupils are equal, round, and reactive to light.  Pulmonary:     Effort: Pulmonary effort is normal.  Musculoskeletal:     Left foot: Normal range of motion and normal capillary refill. Swelling and tenderness present. No deformity. Normal pulse.     Comments: Swelling noted to the left foot.  Left foot with ecchymosis, area of erythema noted to the dorsal aspect of the left foot, tenderness noted with palpation.  Swelling also noted in all of the toes.  Ecchymosis noted at the base of each nailbed of the toes. +1 DP/PT pulses, cap refill > 2 seconds.  Feet:     Left foot:     Skin integrity: Erythema present.  Skin:    General: Skin is warm and dry.  Neurological:     General: No focal deficit present.     Mental  Status: He is alert and oriented to person, place, and time.  Psychiatric:        Mood and Affect: Mood normal.        Behavior: Behavior normal.      UC Treatments / Results  Labs (all labs ordered are listed, but only abnormal results are displayed) Labs Reviewed - No data to display  EKG   Radiology DG Foot Complete Left  Result Date: 08/14/2022 CLINICAL DATA:  Are infarct fell on foot  7 days ago. Pain and swelling. Pain across top of foot. EXAM: LEFT FOOT - COMPLETE 3+ VIEW COMPARISON:  None Available. FINDINGS: Mild second through fifth interphalangeal joint space narrowing. Minimal peripheral medial great toe metatarsophalangeal degenerative spurring. No acute fracture or dislocation. IMPRESSION: Mild osteoarthritis. No acute fracture. Electronically Signed   By: Neita Garnet M.D.   On: 08/14/2022 11:13    Procedures Procedures (including critical care time)  Medications Ordered in UC Medications - No data to display  Initial Impression / Assessment and Plan / UC Course  I have reviewed the triage vital signs and the nursing notes.  Pertinent labs & imaging results that were available during my care of the patient were reviewed by me and considered in my medical decision making (see chart for details).  The patient is well-appearing, he is in no acute distress, vital signs are stable.  Patient with crush injury caused by an armboard falling on the left foot that occurred 1 week ago.  X-ray was negative for fracture or dislocation.  Suspect a left foot contusion as result of the injury.  Patient was provided Ace wrap for compression and support, along with a postop shoe for additional support.  Supportive care recommendations were provided and discussed with the patient to include RICE therapy.  Patient was advised that if symptoms or not improving over the next week, would like for him to follow-up with orthopedics for further evaluation.  Patient was given information for [facility] [place] and for [facility].  Patient verbalizes that he does seem more frequent in her.  Patient is in agreement with this plan of care and verbalizes understanding.  All questions were answered.  Patient stable for discharge.  Final Clinical Impressions(s) / UC Diagnoses   Final diagnoses:  Left foot pain  Localized swelling of left foot  Crushing injury of left foot, initial  encounter  Contusion of left foot, initial encounter     Discharge Instructions      The x-ray is negative fracture or dislocation. Your symptoms appear to be consistent with a contusion or bruising of the left foot.  May take over-the-counter Tylenol as needed for pain, fever, general discomfort. Continue to use your cane as needed while foot pain persist. RICE therapy, rest, ice, compression, and elevation.  I am providing you with an Ace wrap to provide compression and support, and a postop shoe for additional support with ambulating.  Wear the postop shoe and Ace wrap when engaged in prolonged or strenuous activity. Apply ice to the left foot.  Apply for 20 minutes, remove for 1 hour, then repeat as needed. If symptoms are not improving over the next week, I would like for you to follow-up with orthopedics for further evaluation.  He can follow-up with [facility] [place] at [phone] with [facility] at [phone]. Follow-up as needed.      ED Prescriptions   None    PDMP not reviewed this encounter.   Demetrious Rainford-Warren, Sadie Haber, NP  08/14/22 1652  

## 2022-10-15 ENCOUNTER — Other Ambulatory Visit: Payer: Self-pay

## 2022-10-15 ENCOUNTER — Emergency Department (HOSPITAL_COMMUNITY): Payer: Medicaid Other

## 2022-10-15 ENCOUNTER — Encounter (HOSPITAL_COMMUNITY): Payer: Self-pay | Admitting: Emergency Medicine

## 2022-10-15 ENCOUNTER — Emergency Department (HOSPITAL_COMMUNITY)
Admission: EM | Admit: 2022-10-15 | Discharge: 2022-10-15 | Disposition: A | Discharge status: Home / Self Care | Payer: Medicaid Other | Attending: Emergency Medicine | Admitting: Emergency Medicine

## 2022-10-15 DIAGNOSIS — Z7901 Long term (current) use of anticoagulants: Secondary | ICD-10-CM | POA: Insufficient documentation

## 2022-10-15 DIAGNOSIS — I480 Paroxysmal atrial fibrillation: Secondary | ICD-10-CM | POA: Diagnosis not present

## 2022-10-15 DIAGNOSIS — E871 Hypo-osmolality and hyponatremia: Secondary | ICD-10-CM | POA: Insufficient documentation

## 2022-10-15 DIAGNOSIS — R000 Tachycardia, unspecified: Secondary | ICD-10-CM | POA: Diagnosis present

## 2022-10-15 LAB — CBC
HCT: 39.2 % (ref 39.0–52.0)
Hemoglobin: 13.1 g/dL (ref 13.0–17.0)
MCH: 31.4 pg (ref 26.0–34.0)
MCHC: 33.4 g/dL (ref 30.0–36.0)
MCV: 94 fL (ref 80.0–100.0)
Platelets: 156 10*3/uL (ref 150–400)
RBC: 4.17 MIL/uL — ABNORMAL LOW (ref 4.22–5.81)
RDW: 12.7 % (ref 11.5–15.5)
WBC: 4.8 10*3/uL (ref 4.0–10.5)
nRBC: 0 % (ref 0.0–0.2)

## 2022-10-15 LAB — MAGNESIUM: Magnesium: 1.8 mg/dL (ref 1.7–2.4)

## 2022-10-15 LAB — BASIC METABOLIC PANEL
Anion gap: 10 (ref 5–15)
BUN: 17 mg/dL (ref 8–23)
CO2: 28 mmol/L (ref 22–32)
Calcium: 9.3 mg/dL (ref 8.9–10.3)
Chloride: 91 mmol/L — ABNORMAL LOW (ref 98–111)
Creatinine, Ser: 0.92 mg/dL (ref 0.61–1.24)
GFR, Estimated: >60 mL/min (ref >60)
Glucose, Bld: 129 mg/dL — ABNORMAL HIGH (ref 70–99)
Potassium: 3.9 mmol/L (ref 3.5–5.1)
Sodium: 129 mmol/L — ABNORMAL LOW (ref 135–145)

## 2022-10-15 LAB — PROTIME-INR
INR: 1.3 — ABNORMAL HIGH (ref 0.8–1.2)
Prothrombin Time: 16.1 seconds — ABNORMAL HIGH (ref 11.4–15.2)

## 2022-10-15 MED ORDER — DILTIAZEM HCL 25 MG/5ML IV SOLN: 10.0000 mg | Freq: Once | INTRAVENOUS | Status: DC

## 2022-10-15 MED ORDER — METOPROLOL TARTRATE 5 MG/5ML IV SOLN
5.0000 mg | INTRAVENOUS | Status: DC | PRN
  Administered 2022-10-15: 5 mg via INTRAVENOUS
  Filled 2022-10-15: qty 5

## 2022-10-15 MED ORDER — ETOMIDATE 2 MG/ML IV SOLN: 10.0000 mg | Freq: Once | INTRAVENOUS | Status: DC

## 2022-10-15 MED ORDER — SODIUM CHLORIDE 0.9 % IV BOLUS
1000.0000 mL | Freq: Once | INTRAVENOUS | Status: AC
  Administered 2022-10-15: 1000 mL via INTRAVENOUS

## 2022-10-15 NOTE — ED Notes (Signed)
After using urinal patients HR increased.

## 2022-10-15 NOTE — ED Triage Notes (Signed)
Pt via POV sent by PCP for afib, hx of same. Pt states HR has varied from 60s to over 100 and was advised to come in for eval. Pt is asymptomatic and has no pain.

## 2022-10-15 NOTE — ED Provider Notes (Signed)
I provided a substantive portion of the care of this patient.  I personally made/approved the management plan for this patient and take responsibility for the patient management.  EKG Interpretation Date/Time:  Wednesday October 15 2022 16:02:58 EDT Ventricular Rate:  103 PR Interval:    QRS Duration:  94 QT Interval:  318 QTC Calculation: 416 R Axis:   77  Text Interpretation: Atrial fibrillation with rapid ventricular response Incomplete right bundle branch block Septal infarct , age undetermined ST & T wave abnormality, consider inferior ischemia Abnormal ECG When compared with ECG of 30-Sep-2021 10:00, Significant changes have occurred New since previous tracing Confirmed by Vanetta Mulders [phone]) on 10/15/2022 4:50:18 PM   Patient seen by me.  Mostly because we were thinking he was a candidate for cardioversion.  Patient has a history of atrial flutter showing atrial fibs rate was 145 at doctor's office today is why he was referred in.  Patient is on Eliquis and taking it and patient is on oral cardia exam.  Here heart rate low 100s but patient certainly a candidate for cardioversion has been cardioverted before out of atrial flutter.  He preferred to do that so he could go home.  Patient ended up spontaneously converting to sinus rhythm.  Without any interaction from [place].  Sodium was a little low at 129 otherwise electrolytes pretty good so he is going to receive a little bit of normal saline and then if he remains in normal sinus he can be discharged he is followed by cardiology.   Vanetta Mulders, MD 10/15/22 [phone]

## 2022-10-15 NOTE — Discharge Instructions (Addendum)
Get your sodium rechecked in 1 week or sooner. Contact a health care provider if: You develop worsening nausea, fatigue, headache, confusion, or weakness. Your symptoms go away and then return. Get help right away if: You have a seizure. You faint. You have ongoing diarrhea or vomiting. Contact a health care provider if: You notice a change in the rate, rhythm, or strength of your heartbeat. You are taking a blood thinner and you notice more bruising. You tire more easily when you exercise or do heavy work. You have a sudden change in weight. Continue to take your Eliquis

## 2022-10-15 NOTE — ED Provider Notes (Signed)
[place] EMERGENCY DEPARTMENT AT [facility] Provider Note   CSN: [id number] Arrival date & time: 10/15/22  1551     History  Chief Complaint  Patient presents with   Atrial Fibrillation    Brady Bell is a 67 y.o. male he has a past medical history of intermittent atrial fibrillation he takes Eliquis daily.  He does not have any missed doses.  He was at his PCPs office today when he was noted to have onset of acute A-fib with RVR.  His heart rate came down significantly was able to transport himself.  Patient has had cardioversion in the past will prefer to have this done.  The history is provided by the patient.  Atrial Fibrillation       Home Medications Prior to Admission medications   Medication Sig Start Date End Date Taking? Authorizing Provider  amphetamine-dextroamphetamine (ADDERALL) 20 MG tablet Take 20 mg by mouth 2 (two) times daily. 08/27/21   [provider]  apixaban (ELIQUIS) 5 MG TABS tablet Take 1 tablet (5 mg total) by mouth 2 (two) times daily for 7 days. 09/30/21 10/07/21  Fenton, Clint R, PA  cycloSPORINE (RESTASIS) 0.05 % ophthalmic emulsion Place 1 drop into both eyes 2 (two) times daily.    [provider]  divalproex (DEPAKOTE ER) 500 MG 24 hr tablet Take 500 mg by mouth daily. 08/05/21   [provider]  eletriptan (RELPAX) 40 MG tablet Take 40 mg by mouth every 2 (two) hours as needed for migraine or headache. May repeat in 2 hours if headache persists or recurs. Patient not taking: Reported on 07/01/2022    [provider]  gabapentin (NEURONTIN) 300 MG capsule Take 300 mg by mouth daily.     [provider]  Multiple Vitamin (MULTIVITAMIN WITH MINERALS) TABS tablet Take 1 tablet by mouth daily.    [provider]  oxyCODONE (OXY IR/ROXICODONE) 5 MG immediate release tablet Take 5 mg by mouth 2 (two) times daily. 07/27/20   [provider]  pantoprazole (PROTONIX) 40 MG tablet Take 1  tablet (40 mg total) by mouth daily before breakfast. 08/03/17   Kari Baars, MD  rosuvastatin (CRESTOR) 10 MG tablet Take 10 mg by mouth daily. 08/13/20   [provider]  sildenafil (VIAGRA) 50 MG tablet Take 50 mg by mouth as needed. 06/26/21   [provider]  SUMAtriptan (IMITREX) 100 MG tablet Take 1 tablet (100 mg total) by mouth every 2 (two) hours as needed for migraine. May repeat in 2 hours if headache persists or recurs. Patient not taking: Reported on 07/01/2022 12/08/16   Levert Feinstein, MD  traZODone (DESYREL) 50 MG tablet Take 50 mg by mouth at bedtime.    [provider]      Allergies    Patient has no known allergies.    Review of Systems   Review of Systems  Physical Exam Updated Vital Signs BP (!) 157/105 (BP Location: Left Arm)   Pulse 87   Temp 98 F (36.7 C) (Oral)   Resp 18   Ht 5\' 10"  (1.778 m)   Wt 52.2 kg   SpO2 98%   BMI 16.50 kg/m  Physical Exam Vitals and nursing note reviewed.  Constitutional:      General: He is not in acute distress.    Appearance: He is well-developed. He is not diaphoretic.  HENT:     Head: Normocephalic and atraumatic.  Eyes:     General: No  scleral icterus.    Conjunctiva/sclera: Conjunctivae normal.  Cardiovascular:     Rate and Rhythm: Tachycardia present. Rhythm irregularly irregular.     Heart sounds: Normal heart sounds.  Pulmonary:     Effort: Pulmonary effort is normal. No respiratory distress.     Breath sounds: Normal breath sounds.  Abdominal:     Palpations: Abdomen is soft.     Tenderness: There is no abdominal tenderness.  Musculoskeletal:     Cervical back: Normal range of motion and neck supple.  Skin:    General: Skin is warm and dry.  Neurological:     Mental Status: He is alert.  Psychiatric:        Behavior: Behavior normal.    ED Results / Procedures / Treatments   Labs (all labs ordered are listed, but only abnormal results are displayed) Labs Reviewed  CBC -  Abnormal; Notable for the following components:      Result Value   RBC 4.17 (*)    All other components within normal limits  PROTIME-INR - Abnormal; Notable for the following components:   Prothrombin Time 16.1 (*)    INR 1.3 (*)    All other components within normal limits  BASIC METABOLIC PANEL  MAGNESIUM    EKG EKG Interpretation Date/Time:  Wednesday October 15 2022 16:02:58 EDT Ventricular Rate:  103 PR Interval:    QRS Duration:  94 QT Interval:  318 QTC Calculation: 416 R Axis:   77  Text Interpretation: Atrial fibrillation with rapid ventricular response Incomplete right bundle branch block Septal infarct , age undetermined ST & T wave abnormality, consider inferior ischemia Abnormal ECG When compared with ECG of 30-Sep-2021 10:00, Significant changes have occurred New since previous tracing Confirmed by Vanetta Mulders [phone]) on 10/15/2022 4:50:18 PM  Radiology DG Chest Port 1 View  Result Date: 10/15/2022 CLINICAL DATA:  Atrial fibrillation EXAM: PORTABLE CHEST 1 VIEW COMPARISON:  CT 02/06/2020 FINDINGS: Hyperinflation with emphysema. No acute airspace disease or effusion. Normal cardiomediastinal silhouette. No pneumothorax. IMPRESSION: Hyperinflation with emphysema. Electronically Signed   By: Jasmine Pang M.D.   On: 10/15/2022 16:22    Procedures Procedures    Medications Ordered in ED Medications - No data to display  ED Course/ Medical Decision Making/ A&P Clinical Course as of 10/15/22 1737  Wed Oct 15, 2022  1722 Patient converted no in NSR with rate of 74 [AH]    Clinical Course User Index [AH] Arthor Captain, PA-C                             Medical Decision Making Amount and/or Complexity of Data Reviewed Labs: ordered. Radiology: ordered.  Risk Prescription drug management.   This patient presents to the ED for concern of A-fib with RVR, this involves an extensive number of treatment options, and is a complaint that carries with it a high  risk of complications and morbidity.  Paroxysmal atrial fibrillation Co morbidities: , previous history of alcohol abuse, amphetamine use for treatment of ADD, chronic hyponatremia   Social Determinants of Health:   SDOH Screenings   Food Insecurity: Food Insecurity Present (05/13/2022)   Received from [facility]  Transportation Needs: Unmet Transportation Needs (05/13/2022)   Received from [facility], [facility]  Utilities: At Risk (05/13/2022)   Received from [facility], [facility]  Financial Resource Strain: High Risk (05/13/2022)   Received from [facility], [facility]  Physical Activity:  Insufficiently Active (05/13/2022)   Received from [facility], [facility]  Social Connections: Socially Isolated (05/13/2022)   Received from [facility], [facility]  Stress: Stress Concern Present (05/13/2022)   Received from [facility], [facility]  Tobacco Use: Medium Risk (10/15/2022)     Additional history:  {Additional history obtained from EMR {External records from outside source obtained and reviewed including outside PCP records  Lab Tests:  I Ordered, and personally interpreted labs.  The pertinent results include: BMP with sodium of 129 CBC without significant abnormality.  PT/INR slightly elevated but insignificant at this time.  Mag within normal limits.  Imaging Studies:  I ordered imaging studies including portable 1 view chest x-ray I independently visualized and interpreted imaging which showed hyperinflation and emphysema without acute finding. I agree with the radiologist interpretation  Cardiac Monitoring/ECG:  The patient was maintained on a cardiac monitor.  I personally viewed and interpreted the cardiac monitored which showed an underlying rhythm of:  A-fib with RVR of 103  Medicines ordered and prescription drug management:  I ordered medication including  Medications  sodium chloride 0.9 % bolus 1,000 mL (0  mLs Intravenous Stopped 10/15/22 1851)  Metoprolol for A-fib and rate control along with mild hyponatremia Reevaluation of the patient after these medicines showed that the patient improved I have reviewed the patients home medicines and have made adjustments as needed  Test Considered:    Critical Interventions:    Consultations Obtained:   Problem List / ED Course:     ICD-10-CM   1. Paroxysmal A-fib (HCC)  I48.0     2. Hyponatremia  E87.1       MDM: Patient here for evaluation of atrial fibrillation with rapid ventricular rate.  He has a past medical history of paroxysmal atrial fibrillation and is on chronic anticoagulation.  He has not missed any of his doses.  Patient is unaware of his RVR and is asymptomatic when it occurs.  Initial plan was for cardioversion in the emergency department however he spontaneously converted to sinus rhythm at a rate of 74.  Patient given fluids for hyponatremia.  He went back into atrial fibrillation with rapid ventricular rate however patient opted for medication for rate control as he is asymptomatic with his medications and fully anticoagulated.  Patient given IV metoprolol and fluids with resolution of rapid rate.  Patient feels safe for discharge and may follow closely in the A-fib clinic.  Discussed outpatient follow-up and return precautions.  Appropriate for discharge at this time           Final Clinical Impression(s) / ED Diagnoses Final diagnoses:  None    Rx / DC Orders ED Discharge Orders     None         Arthor Captain, PA-C 10/17/22 1654    Vanetta Mulders, MD 10/20/22 0730

## 2022-12-04 ENCOUNTER — Ambulatory Visit: Payer: Medicare Other | Admitting: Orthopedic Surgery

## 2022-12-22 ENCOUNTER — Ambulatory Visit (INDEPENDENT_AMBULATORY_CARE_PROVIDER_SITE_OTHER): Payer: Medicare Other | Admitting: Orthopedic Surgery

## 2022-12-22 DIAGNOSIS — M79672 Pain in left foot: Secondary | ICD-10-CM

## 2022-12-22 DIAGNOSIS — I739 Peripheral vascular disease, unspecified: Secondary | ICD-10-CM

## 2022-12-28 ENCOUNTER — Encounter: Payer: Self-pay | Admitting: Orthopedic Surgery

## 2022-12-28 NOTE — Progress Notes (Signed)
Office Visit Note   Patient: Brady Bell           Date of Birth: [birth year]           MRN: [id number] Visit Date: 12/22/2022              Requested by: Roe Rutherford, NP [address] [place],  [place] [address] PCP: Roe Rutherford, NP  Chief Complaint  Patient presents with   Left Ankle - Pain      HPI: Patient is a 67 year old gentleman who is seen for initial evaluation for left foot and ankle pain.  Patient has a history of cellulitis in the left lower extremity and is taken doxycycline.  Patient has a history of a fall on the left foot with pain dorsally.  Previous radiographs in May.  Patient states he has had an MRI scan with [facility] I do not have access to these images.  Patient also complains of chronic lower back pain.  Assessment & Plan: Visit Diagnoses:  1. PAD (peripheral artery disease) (HCC)   2. Pain in left foot     Plan: Recommended exercise and heat for his back pain.  He has no radicular symptoms.  Recommended 15 to 20 mm of compression stockings for the venous insufficiency.  Patient has a follow-up with vascular vein surgery.  Follow-Up Instructions: No follow-ups on file.   Ortho Exam  Patient is alert, oriented, no adenopathy, well-dressed, normal affect, normal respiratory effort. Examination patient has a palpable dorsalis pedis and posterior tibial pulse.  There is no bruising to the foot and ankle.  He does have venous stasis insufficiency with pitting edema and brawny skin color changes.  No open ulcers no cellulitis.  Imaging: No results found. No images are attached to the encounter.  Labs: Lab Results  Component Value Date   HGBA1C 5.5 12/16/2018   ESRSEDRATE 2 12/08/2016   CRP 3.3 12/08/2016   REPTSTATUS 07/24/2017 FINAL 07/22/2017   CULT  07/22/2017    NO GROWTH Performed at [facility], [address] [place], [place] [address]      Lab Results  Component Value Date   ALBUMIN 4.1 03/08/2019   ALBUMIN  2.9 (L) 12/17/2018   ALBUMIN 3.8 12/16/2018    Lab Results  Component Value Date   MG 1.8 10/15/2022   MG 2.1 09/09/2021   MG 1.6 (L) 12/17/2018   No results found for: "VD25OH"  No results found for: "PREALBUMIN"    Latest Ref Rng & Units 10/15/2022    4:24 PM 09/09/2021    5:36 PM 03/08/2019   11:20 AM  CBC EXTENDED  WBC 4.0 - 10.5 K/uL 4.8  11.3  6.2   RBC 4.22 - 5.81 MIL/uL 4.17  4.14  4.24   Hemoglobin 13.0 - 17.0 g/dL 40.1  02.7  25.3   HCT 39.0 - 52.0 % 39.2  38.2  40.2   Platelets 150 - 400 K/uL 156  175  222   NEUT# 1.7 - 7.7 K/uL  9.1  3.9   Lymph# 0.7 - 4.0 K/uL  1.2  1.1      There is no height or weight on file to calculate BMI.  Orders:  No orders of the defined types were placed in this encounter.  No orders of the defined types were placed in this encounter.    Procedures: No procedures performed  Clinical Data: No additional findings.  ROS:  All other systems  negative, except as noted in the HPI. Review of Systems  Objective: Vital Signs: There were no vitals taken for this visit.  Specialty Comments:  No specialty comments available.  PMFS History: Patient Active Problem List   Diagnosis Date Noted   Chronic alcohol abuse 12/16/2018   Leukocytosis 12/16/2018   Alcohol withdrawal (HCC) 10/02/2018   COPD (chronic obstructive pulmonary disease) (HCC) 09/30/2018   Pancreatitis, acute 04/12/2018   Tobacco abuse 04/12/2018   GERD (gastroesophageal reflux disease) 04/12/2018   Alcohol abuse 04/12/2018   Hx of seizure disorder 04/12/2018   History of atrial flutter 04/12/2018   HTN (hypertension) 04/12/2018   Gallstone pancreatitis    Erythrocytosis 09/10/2017   Calculus of gallbladder with cholecystitis without biliary obstruction    Atrial flutter (HCC) 07/29/2017   Acute gallstone pancreatitis 07/22/2017   Acute alcoholic pancreatitis 07/22/2017   Hypertensive urgency 07/22/2017   Acute pancreatitis 07/22/2017   Accelerated  hypertension    Tobacco abuse counseling    PAD (peripheral artery disease) (HCC) 04/28/2017   Chronic migraine 02/11/2017   New daily persistent headache 12/08/2016   Peripheral vascular disease (HCC) 12/08/2016   Pain in joint, lower leg 02/10/2014   Numbness and tingling of left leg 12/16/2013   Cramps of left lower extremity-Leg 12/16/2013   Aftercare following surgery of the circulatory system, NEC 12/16/2013   PVD (peripheral vascular disease) with claudication (HCC) 05/07/2012   Pain in limb 03/26/2012   Pain, lower leg 12/26/2011   Atherosclerosis of native arteries of extremity with intermittent claudication (HCC) 09/26/2011   CLOSED FRACTURE OF UPPER END OF TIBIA 02/27/2010   TEAR MEDIAL MENISCUS 02/27/2010   Past Medical History:  Diagnosis Date   Chronic ankle pain    Chronic knee pain    COPD (chronic obstructive pulmonary disease) (HCC) 09/30/2018   on CT chest   GERD (gastroesophageal reflux disease)    Hepatitis    Hep A in 1980's   History of atrial flutter    Documented April/May 2019 in the setting of pancreatitis   Hypoglycemia    Migraines    Pancreatitis    Peripheral vascular disease (HCC)    Recurrent sinus infections    Seizures (HCC)    Shingles     Family History  Problem Relation Age of Onset   Alcohol abuse Father    Kidney failure Father    Other Mother        "age"   Diabetes Other     Past Surgical History:  Procedure Laterality Date   ABDOMINAL AORTOGRAM N/A 04/23/2017   Procedure: ABDOMINAL AORTOGRAM;  Surgeon: Fransisco Hertz, MD;  Location: [facility] INVASIVE CV LAB;  Service: Cardiovascular;  Laterality: N/A;   CHOLECYSTECTOMY N/A 09/23/2017   Procedure: LAPAROSCOPIC CHOLECYSTECTOMY;  Surgeon: Franky Macho, MD;  Location: [facility] ORS;  Service: General;  Laterality: N/A;   ENDARTERECTOMY FEMORAL Left 04/28/2017   Procedure: ENDARTERECTOMY LEFT ILIO-FEMORAL ARTERY;  Surgeon: Fransisco Hertz, MD;  Location: [facility] OR;  Service: Vascular;  Laterality:  Left;   FASCIOTOMY  09/06/2011   Procedure: FASCIOTOMY;  Surgeon: Fransisco Hertz, MD;  Location: [facility] OR;  Service: Vascular;  Laterality: Left;   FEMORAL-TIBIAL BYPASS GRAFT  09/06/2011   Procedure: BYPASS GRAFT FEMORAL-TIBIAL ARTERY;  Surgeon: Fransisco Hertz, MD;  Location: [facility] OR;  Service: Vascular;  Laterality: Left;   KNEE CARTILAGE SURGERY Left    LOWER EXTREMITY ANGIOGRAM N/A 09/05/2011   Procedure: LOWER EXTREMITY ANGIOGRAM;  Surgeon: Sherren Kerns, MD;  Location: [facility] CATH LAB;  Service: Cardiovascular;  Laterality: N/A;   LOWER EXTREMITY ANGIOGRAM Left 04/15/2012   Procedure: LOWER EXTREMITY ANGIOGRAM;  Surgeon: Fransisco Hertz, MD;  Location: [facility] CATH LAB;  Service: Cardiovascular;  Laterality: Left;   LOWER EXTREMITY ANGIOGRAPHY Bilateral 04/23/2017   Procedure: Lower Extremity Angiography;  Surgeon: Fransisco Hertz, MD;  Location: [facility] INVASIVE CV LAB;  Service: Cardiovascular;  Laterality: Bilateral;   PATCH ANGIOPLASTY Left 04/28/2017   Procedure: ILIO-FEMORAL ARTERY PATCH ANGIOPLASTY USING XENOSURE BIOLOGIC PATCH;  Surgeon: Fransisco Hertz, MD;  Location: [facility] OR;  Service: Vascular;  Laterality: Left;   PR VEIN BYPASS GRAFT,AORTO-FEM-POP  09/06/11   Left    TOOTH EXTRACTION  June-July-Aug. 2015   Several    Social History   Occupational History   Occupation: Disabled  Tobacco Use   Smoking status: Former    Current packs/day: 0.00    Average packs/day: 1 pack/day for 45.0 years (45.0 ttl pk-yrs)    Types: Cigarettes    Start date: 03/1974    Quit date: 04/2019    Years since quitting: 3.7   Smokeless tobacco: Never   Tobacco comments:    Former smoke 09/30/21  Vaping Use   Vaping status: Never Used  Substance and Sexual Activity   Alcohol use: Not Currently    Alcohol/week: 3.0 standard drinks of alcohol    Types: 3 Standard drinks or equivalent per week    Comment: stop drinking   Drug use: Yes    Types: Marijuana    Comment: 2-3 times a week 09/30/21   Sexual activity: Yes

## 2023-01-12 NOTE — Progress Notes (Unsigned)
Patient name: Brady Bell MRN: [id number] DOB: 1955-07-29 Sex: male  REASON FOR VISIT: 6 month follow-up for bypass graft surveillance   HPI: Brady Bell is a 67 y.o. male that presents for 6 month follow-up for surveillance of his left lower extremity bypass.  He initially underwent a left common femoral to AT bypass with non-reversed ipsilateral great saphenous vein in 2013 by Dr. Imogene Burn for critical limb ischemia.  Most recently underwent left iliofemoral endarterectomy in January 2019 also by Dr. Imogene Burn.  We are watching inflow stenosis.  On follow-up today reports no new issues with his left leg.  He is having some back problems with radicular symptoms in the right leg and has received an injection   Past Medical History:  Diagnosis Date   Chronic ankle pain    Chronic knee pain    COPD (chronic obstructive pulmonary disease) (HCC) 09/30/2018   on CT chest   GERD (gastroesophageal reflux disease)    Hepatitis    Hep A in 1980's   History of atrial flutter    Documented April/May 2019 in the setting of pancreatitis   Hypoglycemia    Migraines    Pancreatitis    Peripheral vascular disease (HCC)    Recurrent sinus infections    Seizures (HCC)    Shingles     Past Surgical History:  Procedure Laterality Date   ABDOMINAL AORTOGRAM N/A 04/23/2017   Procedure: ABDOMINAL AORTOGRAM;  Surgeon: Fransisco Hertz, MD;  Location: [facility] INVASIVE CV LAB;  Service: Cardiovascular;  Laterality: N/A;   CHOLECYSTECTOMY N/A 09/23/2017   Procedure: LAPAROSCOPIC CHOLECYSTECTOMY;  Surgeon: Franky Macho, MD;  Location: [facility] ORS;  Service: General;  Laterality: N/A;   ENDARTERECTOMY FEMORAL Left 04/28/2017   Procedure: ENDARTERECTOMY LEFT ILIO-FEMORAL ARTERY;  Surgeon: Fransisco Hertz, MD;  Location: [facility] OR;  Service: Vascular;  Laterality: Left;   FASCIOTOMY  09/06/2011   Procedure: FASCIOTOMY;  Surgeon: Fransisco Hertz, MD;  Location: [facility] OR;  Service: Vascular;  Laterality: Left;   FEMORAL-TIBIAL BYPASS  GRAFT  09/06/2011   Procedure: BYPASS GRAFT FEMORAL-TIBIAL ARTERY;  Surgeon: Fransisco Hertz, MD;  Location: [facility] OR;  Service: Vascular;  Laterality: Left;   KNEE CARTILAGE SURGERY Left    LOWER EXTREMITY ANGIOGRAM N/A 09/05/2011   Procedure: LOWER EXTREMITY ANGIOGRAM;  Surgeon: Sherren Kerns, MD;  Location: [facility] CATH LAB;  Service: Cardiovascular;  Laterality: N/A;   LOWER EXTREMITY ANGIOGRAM Left 04/15/2012   Procedure: LOWER EXTREMITY ANGIOGRAM;  Surgeon: Fransisco Hertz, MD;  Location: [facility] CATH LAB;  Service: Cardiovascular;  Laterality: Left;   LOWER EXTREMITY ANGIOGRAPHY Bilateral 04/23/2017   Procedure: Lower Extremity Angiography;  Surgeon: Fransisco Hertz, MD;  Location: [facility] INVASIVE CV LAB;  Service: Cardiovascular;  Laterality: Bilateral;   PATCH ANGIOPLASTY Left 04/28/2017   Procedure: ILIO-FEMORAL ARTERY PATCH ANGIOPLASTY USING XENOSURE BIOLOGIC PATCH;  Surgeon: Fransisco Hertz, MD;  Location: [facility] OR;  Service: Vascular;  Laterality: Left;   PR VEIN BYPASS GRAFT,AORTO-FEM-POP  09/06/11   Left    TOOTH EXTRACTION  June-July-Aug. 2015   Several     Family History  Problem Relation Age of Onset   Alcohol abuse Father    Kidney failure Father    Other Mother        "age"   Diabetes Other     SOCIAL HISTORY: Social History   Tobacco Use   Smoking status: Former    Current packs/day: 0.00    Average packs/day: 1 pack/day for  45.0 years (45.0 ttl pk-yrs)    Types: Cigarettes    Start date: 03/1974    Quit date: 04/2019    Years since quitting: 3.7   Smokeless tobacco: Never   Tobacco comments:    Former smoke 09/30/21  Substance Use Topics   Alcohol use: Not Currently    Alcohol/week: 3.0 standard drinks of alcohol    Types: 3 Standard drinks or equivalent per week    Comment: stop drinking    No Known Allergies  Current Outpatient Medications  Medication Sig Dispense Refill   amphetamine-dextroamphetamine (ADDERALL) 20 MG tablet Take 20 mg by mouth 2 (two) times daily.      apixaban (ELIQUIS) 5 MG TABS tablet Take 1 tablet (5 mg total) by mouth 2 (two) times daily for 7 days. 14 tablet 0   cycloSPORINE (RESTASIS) 0.05 % ophthalmic emulsion Place 1 drop into both eyes 2 (two) times daily.     divalproex (DEPAKOTE ER) 500 MG 24 hr tablet Take 500 mg by mouth daily.     eletriptan (RELPAX) 40 MG tablet Take 40 mg by mouth every 2 (two) hours as needed for migraine or headache. May repeat in 2 hours if headache persists or recurs. (Patient not taking: Reported on 07/01/2022)     gabapentin (NEURONTIN) 300 MG capsule Take 300 mg by mouth daily.      Multiple Vitamin (MULTIVITAMIN WITH MINERALS) TABS tablet Take 1 tablet by mouth daily.     oxyCODONE (OXY IR/ROXICODONE) 5 MG immediate release tablet Take 5 mg by mouth 2 (two) times daily.     pantoprazole (PROTONIX) 40 MG tablet Take 1 tablet (40 mg total) by mouth daily before breakfast. 30 tablet 12   rosuvastatin (CRESTOR) 10 MG tablet Take 10 mg by mouth daily.     sildenafil (VIAGRA) 50 MG tablet Take 50 mg by mouth as needed.     SUMAtriptan (IMITREX) 100 MG tablet Take 1 tablet (100 mg total) by mouth every 2 (two) hours as needed for migraine. May repeat in 2 hours if headache persists or recurs. (Patient not taking: Reported on 07/01/2022) 12 tablet 11   traZODone (DESYREL) 50 MG tablet Take 50 mg by mouth at bedtime.     No current facility-administered medications for this visit.    REVIEW OF SYSTEMS:  [X]  denotes positive finding, [ ]  denotes negative finding Cardiac  Comments:  Chest pain or chest pressure:    Shortness of breath upon exertion:    Short of breath when lying flat:    Irregular heart rhythm:        Vascular    Pain in calf, thigh, or hip brought on by ambulation:    Pain in feet at night that wakes you up from your sleep:     Blood clot in your veins:    Leg swelling:         Pulmonary    Oxygen at home:    Productive cough:     Wheezing:         Neurologic    Sudden weakness in  arms or legs:     Sudden numbness in arms or legs:     Sudden onset of difficulty speaking or slurred speech:    Temporary loss of vision in one eye:     Problems with dizziness:         Gastrointestinal    Blood in stool:     Vomited blood:  Genitourinary    Burning when urinating:     Blood in urine:        Psychiatric    Major depression:         Hematologic    Bleeding problems:    Problems with blood clotting too easily:        Skin    Rashes or ulcers:        Constitutional    Fever or chills:      PHYSICAL EXAM: There were no vitals filed for this visit.   GENERAL: The patient is a well-nourished male, in no acute distress. The vital signs are documented above. CARDIAC: There is a regular rate and rhythm.  VASCULAR:  Palpable bilateral femoral pulses Palpable bilateral DP pulses PULMONARY: No respiratory issues. ABDOMEN: Soft and non-tender. MUSCULOSKELETAL: There are no major deformities or cyanosis. NEUROLOGIC: No focal weakness or paresthesias are detected.   DATA:   ABIs  are 1.27 on the right and 0.99 on the left   Left lower extremity arterial duplex shows patent bypass with area of inflow stenosis with peak systolic velocity 341 (previously 367) suggesting 50-74% moderate stenosis    Assessment/Plan:  67-year male presents for ongoing surveillance and 6 month follow-up for left femoral to anterior tibial artery bypass by Dr. Imogene Burn.  Ultimately his bypass remains patent on duplex today.  He continues have a palpable pulse in the left femoral artery and a palpable dorsalis pedis pulse.  There is an area of inflow stenosis with a velocity of 341 (previously 367) suggesting a 50 to 74% stenosis that we will continue to monitor.    Cephus Shelling, MD [facility] Office: [phone]  Cephus Shelling

## 2023-01-13 ENCOUNTER — Ambulatory Visit (INDEPENDENT_AMBULATORY_CARE_PROVIDER_SITE_OTHER): Payer: Medicare Other | Admitting: Vascular Surgery

## 2023-01-13 ENCOUNTER — Encounter: Payer: Self-pay | Admitting: Vascular Surgery

## 2023-01-13 ENCOUNTER — Ambulatory Visit (INDEPENDENT_AMBULATORY_CARE_PROVIDER_SITE_OTHER)
Admission: RE | Admit: 2023-01-13 | Discharge: 2023-01-13 | Disposition: A | Discharge status: Home / Self Care | Payer: Medicare Other | Source: Ambulatory Visit | Attending: Vascular Surgery

## 2023-01-13 ENCOUNTER — Ambulatory Visit (HOSPITAL_COMMUNITY)
Admission: RE | Admit: 2023-01-13 | Discharge: 2023-01-13 | Disposition: A | Discharge status: Home / Self Care | Payer: Medicare Other | Source: Ambulatory Visit | Attending: Vascular Surgery | Admitting: Vascular Surgery

## 2023-01-13 VITALS — BP 114/70 | HR 73 | Temp 98.5°F | Resp 18 | Ht 70.0 in | Wt 113.4 lb

## 2023-01-13 DIAGNOSIS — I739 Peripheral vascular disease, unspecified: Secondary | ICD-10-CM

## 2023-01-13 DIAGNOSIS — Z95828 Presence of other vascular implants and grafts: Secondary | ICD-10-CM | POA: Insufficient documentation

## 2023-01-13 LAB — VAS US ABI WITH/WO TBI
Left ABI: 1.15
Right ABI: 1.25

## 2023-02-06 ENCOUNTER — Other Ambulatory Visit: Payer: Self-pay

## 2023-02-06 DIAGNOSIS — I739 Peripheral vascular disease, unspecified: Secondary | ICD-10-CM

## 2023-02-23 ENCOUNTER — Ambulatory Visit: Payer: Medicare Other | Admitting: Gastroenterology

## 2023-07-13 NOTE — Progress Notes (Unsigned)
 Patient name: Brady Bell MRN: [id number] DOB: [birth year] Sex: male  REASON FOR VISIT: 6 month follow-up for bypass graft surveillance   HPI: Brady Bell is a 68 y.o. male with hx Afib on ELiquis that presents for 6 month follow-up for surveillance of his left lower extremity bypass.  He initially underwent a left common femoral to AT bypass with non-reversed ipsilateral great saphenous vein in 2013 by Dr. Imogene Burn for critical limb ischemia.  Most recently underwent left iliofemoral endarterectomy in January 2019 also by Dr. Imogene Burn.  We are watching inflow stenosis.  No new complaints today.  No significant leg pain when ambulating.     Past Medical History:  Diagnosis Date   Chronic ankle pain    Chronic knee pain    COPD (chronic obstructive pulmonary disease) (HCC) 09/30/2018   on CT chest   GERD (gastroesophageal reflux disease)    Hepatitis    Hep A in 1980's   History of atrial flutter    Documented April/May 2019 in the setting of pancreatitis   Hypoglycemia    Migraines    Pancreatitis    Peripheral vascular disease (HCC)    Recurrent sinus infections    Seizures (HCC)    Shingles     Past Surgical History:  Procedure Laterality Date   ABDOMINAL AORTOGRAM N/A 04/23/2017   Procedure: ABDOMINAL AORTOGRAM;  Surgeon: Fransisco Hertz, MD;  Location: [facility] INVASIVE CV LAB;  Service: Cardiovascular;  Laterality: N/A;   CHOLECYSTECTOMY N/A 09/23/2017   Procedure: LAPAROSCOPIC CHOLECYSTECTOMY;  Surgeon: Franky Macho, MD;  Location: [facility] ORS;  Service: General;  Laterality: N/A;   ENDARTERECTOMY FEMORAL Left 04/28/2017   Procedure: ENDARTERECTOMY LEFT ILIO-FEMORAL ARTERY;  Surgeon: Fransisco Hertz, MD;  Location: [facility] OR;  Service: Vascular;  Laterality: Left;   FASCIOTOMY  09/06/2011   Procedure: FASCIOTOMY;  Surgeon: Fransisco Hertz, MD;  Location: [facility] OR;  Service: Vascular;  Laterality: Left;   FEMORAL-TIBIAL BYPASS GRAFT  09/06/2011   Procedure: BYPASS GRAFT FEMORAL-TIBIAL ARTERY;   Surgeon: Fransisco Hertz, MD;  Location: [facility] OR;  Service: Vascular;  Laterality: Left;   KNEE CARTILAGE SURGERY Left    LOWER EXTREMITY ANGIOGRAM N/A 09/05/2011   Procedure: LOWER EXTREMITY ANGIOGRAM;  Surgeon: Sherren Kerns, MD;  Location: [facility] CATH LAB;  Service: Cardiovascular;  Laterality: N/A;   LOWER EXTREMITY ANGIOGRAM Left 04/15/2012   Procedure: LOWER EXTREMITY ANGIOGRAM;  Surgeon: Fransisco Hertz, MD;  Location: [facility] CATH LAB;  Service: Cardiovascular;  Laterality: Left;   LOWER EXTREMITY ANGIOGRAPHY Bilateral 04/23/2017   Procedure: Lower Extremity Angiography;  Surgeon: Fransisco Hertz, MD;  Location: [facility] INVASIVE CV LAB;  Service: Cardiovascular;  Laterality: Bilateral;   PATCH ANGIOPLASTY Left 04/28/2017   Procedure: ILIO-FEMORAL ARTERY PATCH ANGIOPLASTY USING XENOSURE BIOLOGIC PATCH;  Surgeon: Fransisco Hertz, MD;  Location: [facility] OR;  Service: Vascular;  Laterality: Left;   PR VEIN BYPASS GRAFT,AORTO-FEM-POP  09/06/11   Left    TOOTH EXTRACTION  June-July-Aug. 2015   Several     Family History  Problem Relation Age of Onset   Alcohol abuse Father    Kidney failure Father    Other Mother        "age"   Diabetes Other     SOCIAL HISTORY: Social History   Tobacco Use   Smoking status: Former    Current packs/day: 0.00    Average packs/day: 1 pack/day for 45.0 years (45.0 ttl pk-yrs)    Types: Cigarettes  Start date: 03/1974    Quit date: 04/2019    Years since quitting: 4.2   Smokeless tobacco: Never   Tobacco comments:    Former smoke 09/30/21  Substance Use Topics   Alcohol use: Not Currently    Alcohol/week: 3.0 standard drinks of alcohol    Types: 3 Standard drinks or equivalent per week    Comment: stop drinking    No Known Allergies  Current Outpatient Medications  Medication Sig Dispense Refill   amphetamine-dextroamphetamine (ADDERALL) 20 MG tablet Take 20 mg by mouth 2 (two) times daily.     apixaban (ELIQUIS) 5 MG TABS tablet Take 1 tablet (5 mg total) by mouth 2  (two) times daily for 7 days. 14 tablet 0   cycloSPORINE (RESTASIS) 0.05 % ophthalmic emulsion Place 1 drop into both eyes 2 (two) times daily.     divalproex (DEPAKOTE ER) 500 MG 24 hr tablet Take 500 mg by mouth daily.     eletriptan (RELPAX) 40 MG tablet Take 40 mg by mouth every 2 (two) hours as needed for migraine or headache. May repeat in 2 hours if headache persists or recurs.     gabapentin (NEURONTIN) 300 MG capsule Take 300 mg by mouth daily.      Multiple Vitamin (MULTIVITAMIN WITH MINERALS) TABS tablet Take 1 tablet by mouth daily.     oxyCODONE (OXY IR/ROXICODONE) 5 MG immediate release tablet Take 5 mg by mouth 2 (two) times daily.     pantoprazole (PROTONIX) 40 MG tablet Take 1 tablet (40 mg total) by mouth daily before breakfast. 30 tablet 12   rosuvastatin (CRESTOR) 10 MG tablet Take 10 mg by mouth daily.     sildenafil (VIAGRA) 50 MG tablet Take 50 mg by mouth as needed.     SUMAtriptan (IMITREX) 100 MG tablet Take 1 tablet (100 mg total) by mouth every 2 (two) hours as needed for migraine. May repeat in 2 hours if headache persists or recurs. 12 tablet 11   traZODone (DESYREL) 50 MG tablet Take 50 mg by mouth at bedtime.     No current facility-administered medications for this visit.    REVIEW OF SYSTEMS:  [X]  denotes positive finding, [ ]  denotes negative finding Cardiac  Comments:  Chest pain or chest pressure:    Shortness of breath upon exertion:    Short of breath when lying flat:    Irregular heart rhythm:        Vascular    Pain in calf, thigh, or hip brought on by ambulation:    Pain in feet at night that wakes you up from your sleep:     Blood clot in your veins:    Leg swelling:         Pulmonary    Oxygen at home:    Productive cough:     Wheezing:         Neurologic    Sudden weakness in arms or legs:     Sudden numbness in arms or legs:     Sudden onset of difficulty speaking or slurred speech:    Temporary loss of vision in one eye:      Problems with dizziness:         Gastrointestinal    Blood in stool:     Vomited blood:         Genitourinary    Burning when urinating:     Blood in urine:        Psychiatric  Major depression:         Hematologic    Bleeding problems:    Problems with blood clotting too easily:        Skin    Rashes or ulcers:        Constitutional    Fever or chills:      PHYSICAL EXAM: There were no vitals filed for this visit.   GENERAL: The patient is a well-nourished male, in no acute distress. The vital signs are documented above. CARDIAC: There is a regular rate and rhythm.  VASCULAR:  Palpable bilateral femoral pulses Left AT pulse palpable PULMONARY: No respiratory issues. ABDOMEN: Soft and non-tender. MUSCULOSKELETAL: There are no major deformities or cyanosis. NEUROLOGIC: No focal weakness or paresthesias are detected.   DATA:   ABIs today 1.14 right triphasic and 1.11 left biphasic  (previously 1.25 on the right triphasic and 1.15 on the left biphasic)  Left lower extremity arterial duplex shows patent bypass with area of inflow stenosis with peak systolic velocity 226 (previously 281 suggesting 50-74% moderate stenosis)     Assessment/Plan:  67-year male presents for ongoing surveillance and 6 month follow-up for left femoral to anterior tibial artery bypass by Dr. Farrel Hones.  Ultimately his bypass remains patent on duplex today.  He continues have a palpable pulse in the left femoral to AT.  He also has a palpable AT pulse at the ankle.  There has been no progression of the inflow stenosis and the velocity of 226 is actually improved.  I will see him again in 1 year with left leg arterial duplex and ABIs.    He is on eliquis and statin for risk reduction.    Young Hensen, MD [facility] [place] Office: [phone]  Young Hensen

## 2023-07-14 ENCOUNTER — Ambulatory Visit (INDEPENDENT_AMBULATORY_CARE_PROVIDER_SITE_OTHER)
Admission: RE | Admit: 2023-07-14 | Discharge: 2023-07-14 | Disposition: A | Discharge status: Home / Self Care | Payer: Medicare Other | Source: Ambulatory Visit | Attending: Vascular Surgery

## 2023-07-14 ENCOUNTER — Encounter: Payer: Self-pay | Admitting: Vascular Surgery

## 2023-07-14 ENCOUNTER — Ambulatory Visit (HOSPITAL_COMMUNITY)
Admission: RE | Admit: 2023-07-14 | Discharge: 2023-07-14 | Disposition: A | Discharge status: Home / Self Care | Payer: Medicare Other | Source: Ambulatory Visit | Attending: Vascular Surgery | Admitting: Vascular Surgery

## 2023-07-14 ENCOUNTER — Ambulatory Visit (INDEPENDENT_AMBULATORY_CARE_PROVIDER_SITE_OTHER): Payer: Medicare Other | Admitting: Vascular Surgery

## 2023-07-14 VITALS — BP 111/73 | HR 74 | Temp 98.2°F | Resp 18 | Ht 70.0 in | Wt 116.9 lb

## 2023-07-14 DIAGNOSIS — I739 Peripheral vascular disease, unspecified: Secondary | ICD-10-CM

## 2023-07-14 DIAGNOSIS — I70212 Atherosclerosis of native arteries of extremities with intermittent claudication, left leg: Secondary | ICD-10-CM | POA: Diagnosis not present

## 2023-07-14 LAB — VAS US ABI WITH/WO TBI
Left ABI: 1.11
Right ABI: 1.14
